# Patient Record
Sex: Female | Born: 1960 | Race: White | Hispanic: No | Marital: Married | Smoking: Current every day smoker
Health system: Southern US, Community
[De-identification: ages and names within clinical notes are randomized; demographics above are authoritative.]

## PROBLEM LIST (undated history)

## (undated) DIAGNOSIS — G35 Multiple sclerosis: Secondary | ICD-10-CM

## (undated) DIAGNOSIS — R569 Unspecified convulsions: Secondary | ICD-10-CM

## (undated) DIAGNOSIS — I10 Essential (primary) hypertension: Secondary | ICD-10-CM

## (undated) DIAGNOSIS — T7840XA Allergy, unspecified, initial encounter: Secondary | ICD-10-CM

## (undated) DIAGNOSIS — Z1589 Genetic susceptibility to other disease: Secondary | ICD-10-CM

## (undated) DIAGNOSIS — Z87440 Personal history of urinary (tract) infections: Secondary | ICD-10-CM

## (undated) DIAGNOSIS — K219 Gastro-esophageal reflux disease without esophagitis: Secondary | ICD-10-CM

## (undated) DIAGNOSIS — K635 Polyp of colon: Secondary | ICD-10-CM

## (undated) DIAGNOSIS — K589 Irritable bowel syndrome without diarrhea: Secondary | ICD-10-CM

## (undated) DIAGNOSIS — D649 Anemia, unspecified: Secondary | ICD-10-CM

## (undated) HISTORY — PX: TUBAL LIGATION: SHX77

## (undated) HISTORY — DX: Personal history of urinary (tract) infections: Z87.440

## (undated) HISTORY — PX: APPENDECTOMY: SHX54

## (undated) HISTORY — DX: Polyp of colon: K63.5

## (undated) HISTORY — DX: Essential (primary) hypertension: I10

## (undated) HISTORY — PX: TONSILLECTOMY: SUR1361

## (undated) HISTORY — DX: Genetic susceptibility to other disease: Z15.89

## (undated) HISTORY — DX: Irritable bowel syndrome without diarrhea: K58.9

## (undated) HISTORY — DX: Unspecified convulsions: R56.9

## (undated) HISTORY — DX: Anemia, unspecified: D64.9

## (undated) HISTORY — DX: Gastro-esophageal reflux disease without esophagitis: K21.9

## (undated) HISTORY — DX: Allergy, unspecified, initial encounter: T78.40XA

---

## 2001-05-14 ENCOUNTER — Emergency Department (HOSPITAL_COMMUNITY): Admission: EM | Admit: 2001-05-14 | Discharge: 2001-05-15 | Payer: Self-pay

## 2004-01-11 HISTORY — PX: PARTIAL HYSTERECTOMY: SHX80

## 2005-01-10 HISTORY — PX: BLADDER SURGERY: SHX569

## 2005-02-14 ENCOUNTER — Encounter: Admission: RE | Admit: 2005-02-14 | Discharge: 2005-02-14 | Payer: Self-pay | Admitting: Sports Medicine

## 2005-06-09 ENCOUNTER — Ambulatory Visit: Payer: Self-pay | Admitting: Internal Medicine

## 2005-06-13 ENCOUNTER — Encounter: Payer: Self-pay | Admitting: Internal Medicine

## 2005-06-13 ENCOUNTER — Ambulatory Visit: Payer: Self-pay | Admitting: Internal Medicine

## 2009-06-19 ENCOUNTER — Emergency Department (HOSPITAL_COMMUNITY): Admission: EM | Admit: 2009-06-19 | Discharge: 2009-06-19 | Payer: Self-pay | Admitting: Emergency Medicine

## 2010-03-29 LAB — POCT I-STAT, CHEM 8
BUN: 11 mg/dL (ref 6–23)
Chloride: 108 mEq/L (ref 96–112)
Glucose, Bld: 82 mg/dL (ref 70–99)
HCT: 38 % (ref 36.0–46.0)
Sodium: 142 mEq/L (ref 135–145)

## 2010-09-14 ENCOUNTER — Telehealth: Payer: Self-pay | Admitting: Internal Medicine

## 2010-09-14 ENCOUNTER — Encounter: Payer: Self-pay | Admitting: *Deleted

## 2010-09-14 NOTE — Telephone Encounter (Signed)
Left a message for patient to call me. 

## 2010-09-14 NOTE — Telephone Encounter (Signed)
Spoke with patient and she saw her PCP Dr. Selena Batten on Friday for a physical and would like to see Dr. Juanda Chance for her IBS. Scheduled patient on 09/29/10 at 3:00 PM. Letter mailed to patient.

## 2010-09-23 ENCOUNTER — Encounter: Payer: Self-pay | Admitting: Internal Medicine

## 2010-09-29 ENCOUNTER — Ambulatory Visit (INDEPENDENT_AMBULATORY_CARE_PROVIDER_SITE_OTHER): Payer: 59 | Admitting: Internal Medicine

## 2010-09-29 ENCOUNTER — Encounter: Payer: Self-pay | Admitting: Internal Medicine

## 2010-09-29 VITALS — BP 128/72 | HR 64 | Ht 63.0 in | Wt 144.0 lb

## 2010-09-29 DIAGNOSIS — K5289 Other specified noninfective gastroenteritis and colitis: Secondary | ICD-10-CM

## 2010-09-29 DIAGNOSIS — R197 Diarrhea, unspecified: Secondary | ICD-10-CM

## 2010-09-29 MED ORDER — MESALAMINE 800 MG PO TBEC: DELAYED_RELEASE_TABLET | ORAL | Status: DC

## 2010-09-29 MED ORDER — PEG-KCL-NACL-NASULF-NA ASC-C 100 G PO SOLR: 1.0000 | Freq: Once | ORAL | Status: DC

## 2010-09-29 NOTE — Patient Instructions (Addendum)
You have been scheduled for a colonoscopy. Please follow written instructions given to you at your visit today.  Please pick up your Moviprep kit at the pharmacy within the next 2-3 days. We have sent the following medications to your pharmacy for you to pick up at your convenience: Asacol HD Take 2 tablets by mouth twice daily. CC: Dr Selena Batten

## 2010-09-29 NOTE — Progress Notes (Signed)
[name] [birth year] MRN [id number]    History of Present Illness:  This is a 50 year old white female with diarrhea who was referred by Dr Selena Batten because of incontinence and urgency. We saw her in 2007 for diarrhea which responded to prednisone. She had a repair of a rectocele and cystocele and her colonoscopy showed focal minimal inflammation consistent with low-grade colitis. She has ankylosing spondylitis and a positive HLA-B27. She was found to have a hyperplastic polyp on a colonoscopy. She has been under a lot of stress and has attributed diarrhea and urgency to irritable bowel syndrome. There is no family history of colon cancer. She admits to crampy abdominal pain but denies blood per rectum.   Past Medical History  Diagnosis Date  . GERD (gastroesophageal reflux disease)   . Hypertension   . Hyperplastic colon polyp   . HLA B27 (HLA B27 positive)   . IBS (irritable bowel syndrome)   . Hx: UTI (urinary tract infection)    Past Surgical History  Procedure Date  . Partial hysterectomy   . Tubal ligation   . Appendectomy   . Bladder surgery   . Cesarean section     x 2     reports that she has been smoking.  She has never used smokeless tobacco. She reports that she does not drink alcohol or use illicit drugs. family history includes Breast cancer in her maternal grandmother; Diabetes in her maternal aunt; Heart disease in her father; and Irritable bowel syndrome in her brother and sister.  There is no history of Colon cancer. No Known Allergies     Denies dysphagia odynophagia shortness of breath or chest pain Review of Systems:  The remainder of the 10  point ROS is negative except as outlined in H&P   Physical Exam: General appearance  Well developed, in no distress. Eyes- non icteric. HEENT nontraumatic, normocephalic. Mouth no lesions, tongue papillated, no cheilosis. Neck supple without adenopathy, thyroid not enlarged, no carotid bruits, no JVD. Lungs Clear  to auscultation bilaterally. Cor normal S1 normal S2, regular rhythm , no murmur,  quiet precordium. Abdomen soft abdomen with tenderness in left lower quadrant and left middle quadrant. No palpable mass or rebound. Normal active bowel sounds. No CVA tenderness. Liver edge at costal margin. Rectal: Normal rectal sphincter tone. Normal perianal area. Stool is soft Hemoccult-negative. Extremities no pedal edema. Skin no lesions. Neurological alert and oriented x 3. Psychological normal mood and affect.  Assessment and Plan:  Problem #1 worsening diarrhea and incontinence. We need to rule out the possibility of inflammatory bowel disease. We will start her on Asacol HD 800 mg 2 by mouth twice a day and schedule her for a colonoscopy and biopsies. If it is irritable bowel syndrome, then she will need a special regimen, dietary modifications and antispasmodics. I would also consider checking IBD markers at some point.    09/29/2010 [name]

## 2010-09-30 ENCOUNTER — Encounter: Payer: Self-pay | Admitting: Internal Medicine

## 2010-10-11 ENCOUNTER — Encounter: Payer: Self-pay | Admitting: Internal Medicine

## 2010-10-11 ENCOUNTER — Ambulatory Visit (AMBULATORY_SURGERY_CENTER): Payer: 59 | Admitting: Internal Medicine

## 2010-10-11 DIAGNOSIS — R197 Diarrhea, unspecified: Secondary | ICD-10-CM

## 2010-10-11 DIAGNOSIS — K5289 Other specified noninfective gastroenteritis and colitis: Secondary | ICD-10-CM

## 2010-10-11 MED ORDER — SODIUM CHLORIDE 0.9 % IV SOLN: 500.0000 mL | INTRAVENOUS | Status: DC

## 2010-10-11 NOTE — Progress Notes (Signed)
Pt states that she fell in the shower on Thursday Sept 27th and still has bruises to right side of forehead, bilateral knees, left great toe and right wrist.

## 2010-10-11 NOTE — Patient Instructions (Signed)
Discharge instructions given with verbal understanding. Biopsies taken. Resume previous medications. 

## 2010-10-12 ENCOUNTER — Telehealth: Payer: Self-pay | Admitting: *Deleted

## 2010-10-12 NOTE — Telephone Encounter (Signed)

## 2010-10-14 ENCOUNTER — Encounter: Payer: Self-pay | Admitting: Internal Medicine

## 2012-09-24 ENCOUNTER — Encounter (HOSPITAL_COMMUNITY): Payer: Self-pay | Admitting: Emergency Medicine

## 2012-09-24 ENCOUNTER — Emergency Department (HOSPITAL_COMMUNITY)
Admission: EM | Admit: 2012-09-24 | Discharge: 2012-09-24 | Disposition: A | Discharge status: Home / Self Care | Payer: 59 | Attending: Emergency Medicine | Admitting: Emergency Medicine

## 2012-09-24 DIAGNOSIS — I10 Essential (primary) hypertension: Secondary | ICD-10-CM | POA: Insufficient documentation

## 2012-09-24 DIAGNOSIS — F29 Unspecified psychosis not due to a substance or known physiological condition: Secondary | ICD-10-CM | POA: Insufficient documentation

## 2012-09-24 DIAGNOSIS — Z860100 Personal history of colon polyps, unspecified: Secondary | ICD-10-CM | POA: Insufficient documentation

## 2012-09-24 DIAGNOSIS — D649 Anemia, unspecified: Secondary | ICD-10-CM | POA: Insufficient documentation

## 2012-09-24 DIAGNOSIS — Z9889 Other specified postprocedural states: Secondary | ICD-10-CM | POA: Insufficient documentation

## 2012-09-24 DIAGNOSIS — Z9089 Acquired absence of other organs: Secondary | ICD-10-CM | POA: Insufficient documentation

## 2012-09-24 DIAGNOSIS — R51 Headache: Secondary | ICD-10-CM | POA: Insufficient documentation

## 2012-09-24 DIAGNOSIS — Z90710 Acquired absence of both cervix and uterus: Secondary | ICD-10-CM | POA: Insufficient documentation

## 2012-09-24 DIAGNOSIS — Z8719 Personal history of other diseases of the digestive system: Secondary | ICD-10-CM | POA: Insufficient documentation

## 2012-09-24 DIAGNOSIS — G40909 Epilepsy, unspecified, not intractable, without status epilepticus: Secondary | ICD-10-CM | POA: Insufficient documentation

## 2012-09-24 DIAGNOSIS — F17200 Nicotine dependence, unspecified, uncomplicated: Secondary | ICD-10-CM | POA: Insufficient documentation

## 2012-09-24 DIAGNOSIS — Z87440 Personal history of urinary (tract) infections: Secondary | ICD-10-CM | POA: Insufficient documentation

## 2012-09-24 DIAGNOSIS — Z79899 Other long term (current) drug therapy: Secondary | ICD-10-CM | POA: Insufficient documentation

## 2012-09-24 DIAGNOSIS — R350 Frequency of micturition: Secondary | ICD-10-CM | POA: Insufficient documentation

## 2012-09-24 DIAGNOSIS — R569 Unspecified convulsions: Secondary | ICD-10-CM

## 2012-09-24 DIAGNOSIS — Z9851 Tubal ligation status: Secondary | ICD-10-CM | POA: Insufficient documentation

## 2012-09-24 LAB — POCT I-STAT, CHEM 8
Calcium, Ion: 1.2 mmol/L (ref 1.12–1.23)
Hemoglobin: 13.3 g/dL (ref 12.0–15.0)
Sodium: 143 mEq/L (ref 135–145)
TCO2: 28 mmol/L (ref 0–100)

## 2012-09-24 LAB — URINALYSIS, ROUTINE W REFLEX MICROSCOPIC
Leukocytes, UA: NEGATIVE
Nitrite: NEGATIVE
Specific Gravity, Urine: 1.018 (ref 1.005–1.030)
pH: 6 (ref 5.0–8.0)

## 2012-09-24 LAB — URINE MICROSCOPIC-ADD ON

## 2012-09-24 MED ORDER — HYDROCODONE-ACETAMINOPHEN 5-325 MG PO TABS: 1.0000 | ORAL_TABLET | Freq: Four times a day (QID) | ORAL | Status: DC | PRN

## 2012-09-24 MED ORDER — ACETAMINOPHEN 500 MG PO TABS
1000.0000 mg | ORAL_TABLET | Freq: Once | ORAL | Status: AC
  Administered 2012-09-24: 1000 mg via ORAL
  Filled 2012-09-24: qty 2

## 2012-09-24 NOTE — ED Notes (Signed)
Pt arrived via [facility] for evaluation of seizures. Per EMS, pt has hx of seizures but hasn't had one in 20 years. Pt had one seizure this morning while in the shower, EMS reports pt fell back in shower but did not hit her head. Pt then had another seizure at 1300 while on the couch that lasted 3-4 minutes and witnessed by pt husband, EMS reports pt did not fall but did bite her tongue. Bleeding was controlled upon EMS arrival and pt was also post-ictal upon arrival. Pt was recently started on a pain medication for broken foot and pt suspects that may have been the trigger per EMS.

## 2012-09-24 NOTE — ED Provider Notes (Addendum)
I saw and evaluated the patient, reviewed the resident's note and I agree with the findings and plan.  Regions of 52 of treatment with a history of hypertension, diabetes and a remote history of seizure disorder who is no longer on the antiepileptics who recently had a lower extremity injury and his daughter-in-law who's had 2 seizures today. In between seizures, patient was able to call neurology and schedule outpatient followup. She is currently having no complaints other than urinary frequency. No other recent infectious symptoms. No drug or alcohol use. She is neurologically intact on exam. Plan is to obtain basic labs, urinalysis. Her Accu-Chek was normal.  Anticipate discharge home with neurology followup. We'll not start AED at this time as this was likely due to tramadol use.  Have instructed patient to stop taking her tramadol. Given return precautions instructed patient not to perform any activities that may be dangerous if she were to have another seizure including driving.   Date: 09/24/2012 14:46  Rate: 86  Rhythm: normal sinus rhythm  QRS Axis: normal  Intervals: normal  ST/T Wave abnormalities: normal  Conduction Disutrbances: none  Narrative Interpretation: unremarkable; no ischemic changes, no arrhythmia, no change compared to prior     Layla Maw Cassidey Barrales, DO 09/24/12 1737  Matther Labell N Tifini Reeder, DO 09/24/12 1745

## 2012-09-24 NOTE — ED Provider Notes (Signed)
CSN: [id number]     Arrival date & time 09/24/12  1432 History   First MD Initiated Contact with Patient 09/24/12 1502     Chief Complaint  Patient presents with  . Seizures   (Consider location/radiation/quality/duration/timing/severity/associated sxs/prior Treatment) Patient is a 52 y.o. female presenting with seizures. The history is provided by the patient.  Seizures Seizure activity on arrival: no   Seizure type:  Grand mal Preceding symptoms: no dizziness, no headache, no nausea, no numbness and no vision change   Initial focality:  None Episode characteristics: generalized shaking, incontinence, tongue biting and unresponsiveness   Postictal symptoms: confusion   Return to baseline: yes   Severity:  Mild Duration: 3-4 minutes. Number of seizures this episode:  2 Progression:  Resolved Context: not alcohol withdrawal, not sleeping less and not previous head injury   Recent head injury:  No recent head injuries History of seizures: yes (none in 20 years, recently started Tramadol for an ankle fracture)     Past Medical History  Diagnosis Date  . GERD (gastroesophageal reflux disease)   . Hypertension   . Hyperplastic colon polyp   . HLA B27 (HLA B27 positive)   . IBS (irritable bowel syndrome)   . Hx: UTI (urinary tract infection)   . Allergy     seasonal  . Anemia   . Seizures     hx epilepsy   Past Surgical History  Procedure Laterality Date  . Partial hysterectomy    . Tubal ligation    . Appendectomy    . Bladder surgery    . Cesarean section      x 2    Family History  Problem Relation Age of Onset  . Heart disease Father   . Breast cancer Maternal Grandmother   . Diabetes Maternal Aunt   . Colon cancer Neg Hx   . Irritable bowel syndrome Sister   . Irritable bowel syndrome Brother    History  Substance Use Topics  . Smoking status: Current Every Day Smoker -- 1.00 packs/day  . Smokeless tobacco: Never Used  . Alcohol Use: No   OB History   Grav Para Term Preterm Abortions TAB SAB Ect Mult Living                 Review of Systems  Constitutional: Negative for fever and chills.  HENT: Negative for hearing loss, congestion, sore throat, neck pain and neck stiffness.   Respiratory: Negative for shortness of breath.   Cardiovascular: Negative for chest pain.  Gastrointestinal: Negative for nausea, vomiting, abdominal pain and diarrhea.  Genitourinary: Positive for frequency.  Musculoskeletal: Negative for back pain.  Neurological: Positive for seizures and headaches (after the seizure, 9/10, "top of my head", no hx of HAs prior to today). Negative for light-headedness.  All other systems reviewed and are negative.    Allergies  Review of patient's allergies indicates no known allergies.  Home Medications   Current Outpatient Rx  Name  Route  Sig  Dispense  Refill  . B Complex-C (B-COMPLEX WITH VITAMIN C) tablet   Oral   Take 1 tablet by mouth daily.         . Cholecalciferol (VITAMIN D) 2000 UNITS CAPS   Oral   Take 1 capsule by mouth daily.         Marland Kitchen Fexofenadine HCl (ALLEGRA PO)   Oral   Take 1 tablet by mouth daily.          Marland Kitchen ibuprofen (ADVIL,MOTRIN)  200 MG tablet   Oral   Take 200-800 mg by mouth every 6 (six) hours as needed for pain. Dose depends on pain level.         Marland Kitchen L-Methylfolate-B6-B12 (METANX) 3-35-2 MG TABS   Oral   Take 1 tablet by mouth daily. One tablet by mouth twice daily         . losartan (COZAAR) 25 MG tablet   Oral   Take 25 mg by mouth daily.         . traMADol (ULTRAM) 50 MG tablet   Oral   Take 50 mg by mouth every 6 (six) hours as needed for pain.         . vitamin B-12 (CYANOCOBALAMIN) 1000 MCG tablet   Oral   Take 1,000 mcg by mouth daily.            BP 128/80  Pulse 89  Temp(Src) 97.6 F (36.4 C) (Oral)  Resp 16  SpO2 96% Physical Exam  Vitals reviewed. Constitutional: She is oriented to person, place, and time. She appears well-developed and  well-nourished.  HENT:  Right Ear: External ear normal.  Left Ear: External ear normal.  Mouth/Throat: No oropharyngeal exudate.  Eyes: Conjunctivae and EOM are normal. Pupils are equal, round, and reactive to light.  Neck: Normal range of motion. Neck supple.  Cardiovascular: Normal rate, regular rhythm, normal heart sounds and intact distal pulses.  Exam reveals no gallop and no friction rub.   No murmur heard. Pulmonary/Chest: Effort normal and breath sounds normal.  Abdominal: Soft. Bowel sounds are normal. She exhibits no distension. There is no tenderness.  Musculoskeletal: Normal range of motion. She exhibits no edema.  Neurological: She is alert and oriented to person, place, and time. She has normal strength. No cranial nerve deficit or sensory deficit. Coordination normal. GCS eye subscore is 4. GCS verbal subscore is 5. GCS motor subscore is 6.  Skin: Skin is warm and dry. No rash noted.  Psychiatric: She has a normal mood and affect.    ED Course  Procedures (including critical care time) Labs Review Labs Reviewed  URINALYSIS, ROUTINE W REFLEX MICROSCOPIC - Abnormal; Notable for the following:    Hgb urine dipstick TRACE (*)    All other components within normal limits  URINE MICROSCOPIC-ADD ON - Abnormal; Notable for the following:    Squamous Epithelial / LPF MANY (*)    All other components within normal limits  GLUCOSE, CAPILLARY  POCT I-STAT, CHEM 8   Imaging Review No results found.   Date: 09/24/2012  Rate: 86  Rhythm: normal sinus rhythm  QRS Axis: normal  Intervals: normal  ST/T Wave abnormalities: normal  Conduction Disutrbances:none  Narrative Interpretation: NSR, normal EKG  Old EKG Reviewed: unchanged from EKG 06/19/09    MDM   48 y F with remote hx of epilepsy, here after she had 2 seizures today.  She is taking Tramadol for an ankle fracture.  She started this med on Thursday.  She has otherwise been feeling well.  HA now, but none prior to  seizure.  No fall or head trauma before, during or after seizure.  No other pain complaints.  AFVSS, well appearing.  A&Ox4.  Normal neuro exam.  Likely 2/2 tramadol use.  No meningismus or signs of CNS infxn.  Do not feel that CT imaging is warranted. Will eval with chem 8 and FSBS.  She already he has followup with neurology on the 18th. Will d/c tramadol and  give Rx for Norco.  5:34 PM The patient reports feeling better. Her headache is much improved. She feels back to her baseline. She was able to stand and walk with assistance, which is her baseline currently due to her ankle fracture.  Seizure precautions and seizure first aid were reviewed with the patient and her family. Return precautions reviewed. All questions were answered. It is felt at this time that she is stable for discharge with outpatient neurology followup. We discussed potentially starting Keppra, but do not feel this is indicated at this time.  Clinical Impression: 1. Seizure     Disposition: Discharge  Condition: Good  I have discussed the results, Dx and Tx plan. They expressed understanding and agree with the plan and were told to return to ED with any worsening of condition or concern.    New Prescriptions   HYDROCODONE-ACETAMINOPHEN (NORCO/VICODIN) 5-325 MG PER TABLET    Take 1-2 tablets by mouth every 6 (six) hours as needed for pain.    Follow Up: Massie Maroon, MD [address] [place] [address] [phone]  Schedule an appointment as soon as possible for a visit   Neurology as scheduled      Pt seen in conjunction with Dr. Elesa Massed.  Reine Just. Beverely Pace, MD Emergency Medicine PGY-III [phone]     Oleh Genin, MD 09/25/12 [phone]

## 2012-09-27 ENCOUNTER — Encounter: Payer: Self-pay | Admitting: Neurology

## 2012-09-27 ENCOUNTER — Ambulatory Visit (INDEPENDENT_AMBULATORY_CARE_PROVIDER_SITE_OTHER): Payer: 59 | Admitting: Neurology

## 2012-09-27 VITALS — BP 119/73 | HR 81 | Ht 63.0 in | Wt 140.0 lb

## 2012-09-27 DIAGNOSIS — G40909 Epilepsy, unspecified, not intractable, without status epilepticus: Secondary | ICD-10-CM | POA: Insufficient documentation

## 2012-09-27 DIAGNOSIS — R569 Unspecified convulsions: Secondary | ICD-10-CM

## 2012-09-27 MED ORDER — LAMOTRIGINE ER 21 X 25 MG & 7 X 50 MG PO KIT: 25.0000 mg | PACK | Freq: Every day | ORAL | Status: DC

## 2012-09-27 MED ORDER — LAMOTRIGINE ER 100 MG PO TB24: 100.0000 mg | ORAL_TABLET | Freq: Every day | ORAL | Status: DC

## 2012-09-27 NOTE — Progress Notes (Signed)
[facility]  PATIENT: [name] DOB: 19-Jun-1960  HISTORICAL   [name] is a 52 years old right-handed Caucasian female, referred by her primary care physician Dr. Selena Batten, accompanied by her husband, and daughter at today's clinical visit  She had a history of epilepsy disorder from age [age], she had generalized seizure, was treated with Dilantin, and the Mysolin, she no longer has frequent seizure since age 24.  She reported normal EEG at that time, antipeptic medication was tapered off  Most recurrent seizure was at age 17 years, without warning signs, she had a generalized seizure  She suffered a fall, with left ankle injury September11 2014, started taking tramadol 50 mg twice a day to 3 times a day, in September fifteenth, 2014, she woke up in the morning time, 6:30 AM, while brushing her teeth, she felt her eyes rolling backward, later went into tonic-clonic seizure, lasting for a few minutes, followed by post event confusion for 2 hours, later that afternoon, at 1:30 PM, she had another seizure, no warning signs, generalized tonic-clonic seizure, lasting for a few minutes,  Her daughter at age 9 also suffered seizures since age 31,   Laboratory evaluation showed normal UA, BMP,  REVIEW OF SYSTEMS: Full 14 system review of systems performed and notable only for  urination problems, incontinence, numbness, restless leg  ALLERGIES: No Known Allergies  HOME MEDICATIONS: Outpatient Prescriptions Prior to Visit  Medication Sig Dispense Refill  . B Complex-C (B-COMPLEX WITH VITAMIN C) tablet Take 1 tablet by mouth daily.      . Cholecalciferol (VITAMIN D) 2000 UNITS CAPS Take 1 capsule by mouth daily.      Marland Kitchen Fexofenadine HCl (ALLEGRA PO) Take 1 tablet by mouth daily.       Marland Kitchen HYDROcodone-acetaminophen (NORCO/VICODIN) 5-325 MG per tablet Take 1-2 tablets by mouth every 6 (six) hours as needed for pain.  30 tablet  0  . ibuprofen (ADVIL,MOTRIN) 200 MG tablet Take  200-800 mg by mouth every 6 (six) hours as needed for pain. Dose depends on pain level.      Marland Kitchen L-Methylfolate-B6-B12 (METANX) 3-35-2 MG TABS Take 1 tablet by mouth daily. One tablet by mouth twice daily      . losartan (COZAAR) 25 MG tablet Take 25 mg by mouth daily.      . vitamin B-12 (CYANOCOBALAMIN) 1000 MCG tablet Take 1,000 mcg by mouth daily.         No facility-administered medications prior to visit.    PAST MEDICAL HISTORY: Past Medical History  Diagnosis Date  . GERD (gastroesophageal reflux disease)   . Hypertension   . Hyperplastic colon polyp   . HLA B27 (HLA B27 positive)   . IBS (irritable bowel syndrome)   . Hx: UTI (urinary tract infection)   . Allergy     seasonal  . Anemia   . Seizures     hx epilepsy    PAST SURGICAL HISTORY: Past Surgical History  Procedure Laterality Date  . Partial hysterectomy    . Tubal ligation    . Appendectomy    . Bladder surgery    . Cesarean section      x 2     FAMILY HISTORY: Family History  Problem Relation Age of Onset  . Heart disease Father   . Breast cancer Maternal Grandmother   . Diabetes Maternal Aunt   . Colon cancer Neg Hx   . Irritable bowel syndrome Sister   . Irritable bowel syndrome  Brother     SOCIAL HISTORY:  History   Social History  . Marital Status: Married    Spouse Name: Windy Fast    Number of Children: 3  . Years of Education: 13   Occupational History  . [employer]    .     Social History Main Topics  . Smoking status: Current Every Day Smoker -- 1.00 packs/day for 30 years    Types: Cigarettes  . Smokeless tobacco: Never Used  . Alcohol Use: 0.6 oz/week    1 Glasses of wine per week     Comment: OCC.  . Drug Use: No  . Sexual Activity: Not on file   Other Topics Concern  . Not on file   Social History Narrative   2 caffeine drinks daily . Patient lives at home with her husband Windy Fast). Patient has some college education.   Right handed.     PHYSICAL EXAM    Filed  Vitals:   09/27/12 1003  BP: 119/73  Pulse: 81  Height: 5\' 3"  (1.6 m)  Weight: 140 lb (63.504 kg)     Body mass index is 24.81 kg/(m^2).   Generalized: In no acute distress  Neck: Supple, no carotid bruits   Cardiac: Regular rate rhythm  Pulmonary: Clear to auscultation bilaterally  Musculoskeletal: No deformity  Neurological examination  Mentation: Alert oriented to time, place, history taking, and causual conversation  Cranial nerve II-XII: Pupils were equal round reactive to light extraocular movements were full, visual field were full on confrontational test. facial sensation and strength were normal. hearing was intact to finger rubbing bilaterally. Uvula tongue midline.  head turning and shoulder shrug and were normal and symmetric.Tongue protrusion into cheek strength was normal.  Motor: normal tone, bulk and strength.  Sensory: Intact to fine touch, pinprick, preserved vibratory sensation, and proprioception at toes.  Coordination: Normal finger to nose, heel-to-shin bilaterally there was no truncal ataxia  Gait: Rising up from seated position without assistance, normal stance, without trunk ataxia, moderate stride, good arm swing, smooth turning, able to perform tiptoe, and heel walking without difficulty.   Romberg signs: Negative  Deep tendon reflexes: Brachioradialis 2/2, biceps 2/2, triceps 2/2, patellar 2/2, Achilles 2/2, plantar responses were flexor bilaterally.   DIAGNOSTIC DATA (LABS, IMAGING, TESTING) - I reviewed patient records, labs, notes, testing and imaging myself where available.  Lab Results  Component Value Date   HGB 13.3 09/24/2012   HCT 39.0 09/24/2012      Component Value Date/Time   NA 143 09/24/2012 1537   K 3.6 09/24/2012 1537   CL 104 09/24/2012 1537   GLUCOSE 97 09/24/2012 1537   BUN 14 09/24/2012 1537   CREATININE 1.00 09/24/2012 1537       ASSESSMENT AND PLAN   52 years old Caucasian female, with past medical history of  epilepsy, now with recurrent seizure, while taking tramadol 50 mg twice a day, normal neurological examination,  1. Recurrent seizure, with history of epilepsy, complete evaluation with MRI of the brain with without contrast, EEG, 2. After discussed with patient, were started lamotrigine, X.R 100 mg once every night  3.  no driving till seizure free for 6 months .        Levert Feinstein, M.D. Ph.D.  [facility] [address], [address] [place], [place] [address] [phone]

## 2012-09-28 ENCOUNTER — Ambulatory Visit (INDEPENDENT_AMBULATORY_CARE_PROVIDER_SITE_OTHER): Payer: 59

## 2012-09-28 ENCOUNTER — Telehealth: Payer: Self-pay | Admitting: Neurology

## 2012-09-28 DIAGNOSIS — R569 Unspecified convulsions: Secondary | ICD-10-CM

## 2012-09-28 NOTE — Procedures (Signed)
HISTORY: 52 years old female, with history of epilepsy disorder, presenting with recurrent generalized tonic-clonic seizure.  TECHNIQUE:  16 channel EEG was performed based on standard 10-16 international system. One channel was dedicated to EKG, which has demonstrates normal sinus rhythm of 78 beats per minutes.  Upon awakening, the posterior background activity was well-developed, in alpha range 10Hz , with amplitude of 35 microvoltage, reactive to eye opening and closure.  There was frequent short intrusion of spike-slow waves, lasting 1-3 second.   Photic stimulation was performed, which induced a symmetric photic driving.  Hyperventilation was performed, there was no abnormality elicit.  No sleep was achieved.  CONCLUSION: This is an abnormal EEG.  There is electrodiagnostic evidence of generalized epileptiform discharge, consistent with a history of generalized epilepsy disorder.

## 2012-09-28 NOTE — Telephone Encounter (Signed)
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

## 2012-10-01 ENCOUNTER — Telehealth: Payer: Self-pay | Admitting: Neurology

## 2012-10-05 NOTE — Telephone Encounter (Signed)
Patient requesting a letter stating it is ok to return to work.

## 2012-10-08 NOTE — Telephone Encounter (Signed)
Please generate a note, it is OK to return to work, but she should not drive, until seizure free for 6 months

## 2012-10-15 ENCOUNTER — Encounter: Payer: Self-pay | Admitting: *Deleted

## 2012-10-15 NOTE — Telephone Encounter (Signed)
A return to work note has been generated and faxed to [phone].

## 2012-10-16 DIAGNOSIS — R569 Unspecified convulsions: Secondary | ICD-10-CM

## 2012-10-18 ENCOUNTER — Telehealth: Payer: Self-pay | Admitting: Neurology

## 2012-10-19 MED ORDER — LEVETIRACETAM 500 MG PO TABS: 500.0000 mg | ORAL_TABLET | Freq: Two times a day (BID) | ORAL | Status: DC

## 2012-10-19 NOTE — Telephone Encounter (Signed)
I have called [name], she has rash at her right leg, right calf, goes up to half of her leg. She had it since Lamictal.  She also complains of hand, thumb itching.  1. Stop Lamictal. 2. Switch her to keppra 500mg  bid.

## 2012-10-19 NOTE — Telephone Encounter (Signed)
Spoke to patient. She says she started her 3rd week of Lamotrigine kit on Saturday (10/13/12). On Sunday (10/14/12) she noticed a rash on the back of her lower calf on her R leg. The rash spreaded to the middle of her calf, and a little of the rash is present on her R ankle and foot. The patient reports the rash is a  strawberry color and is warm to her touch. She believes the rash is a reaction to the med and not a insect bite or allergic reaction. Please advise.

## 2012-10-22 ENCOUNTER — Other Ambulatory Visit: Payer: Self-pay | Admitting: Diagnostic Neuroimaging

## 2012-10-22 DIAGNOSIS — R569 Unspecified convulsions: Secondary | ICD-10-CM

## 2012-10-25 NOTE — Progress Notes (Signed)
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

## 2012-10-29 NOTE — Progress Notes (Signed)
Quick Note:  Spoke to patient and relayed MRI brain results, per Dr. Terrace Arabia. Patient mentioned that her new seizure med, Keppra, is making her very tired. She will see if her body adjusts in the next week or two and if not will give [place] a call. ______

## 2013-03-04 ENCOUNTER — Telehealth: Payer: Self-pay | Admitting: Nurse Practitioner

## 2013-03-04 NOTE — Telephone Encounter (Signed)
Pt was scheduled for 03/27/13 CM will be out of the office, could not find anything until 07/08/13. Pt states she needs to come in sooner since she has some questions and also concerning her driving, brain scan and some other issues. Please call pt on cell phone for these concerns.

## 2013-03-15 NOTE — Telephone Encounter (Signed)
Pt is calling back needs someone to call her back concerning her driving and other issues. Please contact pt concerning this matter. Pt did call back on 03/04/13 and needs to get in soon.

## 2013-03-18 NOTE — Telephone Encounter (Signed)
Spoke with patient and she would like to know if she may start driving on Monday  (74/16/38) , six months will be up then and she has not had another seizure.   She would also like a letter stating that the reason for the seizure that she had was from medication (tramadol). She has to have this in order to get paid from [employer], for the days missed from work.

## 2013-03-19 NOTE — Telephone Encounter (Signed)
I have called [name], left a message at her cell phone, and home phone She has a history of epilepsy, had a recurrent seizure in September 2014, while she sustained an ankle injury, taking tramadol,

## 2013-03-20 ENCOUNTER — Telehealth: Payer: Self-pay | Admitting: Neurology

## 2013-03-20 NOTE — Telephone Encounter (Signed)
Pt called and asked if Dr. Terrace Arabia could write a letter stating that in her medical opinion the medicine the pt was taking was the cause of her seizures.  She needs this so that she will be able to get her Workman's Comp paid.  She also asked if she needed to stay on the medicine that Dr. Terrace Arabia prescribed or if she can get off them. She states that she hasn't had a seizure since September 2014.  She asked when she will be able to start driving again and if Dr. Terrace Arabia needed to send anything to the [facility] stating that the patient is ok to drive.  Please call her cell at [phone].  Thank you

## 2013-03-20 NOTE — Telephone Encounter (Signed)
Spoke with patient and informed that a f/u appt, is needed since her previous one was cancelled, explained that the physician/NP may address her questions at that time, she verbalized understanding

## 2013-03-22 ENCOUNTER — Encounter (INDEPENDENT_AMBULATORY_CARE_PROVIDER_SITE_OTHER): Payer: Self-pay

## 2013-03-22 ENCOUNTER — Encounter: Payer: Self-pay | Admitting: Nurse Practitioner

## 2013-03-22 ENCOUNTER — Ambulatory Visit (INDEPENDENT_AMBULATORY_CARE_PROVIDER_SITE_OTHER): Payer: 59 | Admitting: Nurse Practitioner

## 2013-03-22 VITALS — BP 107/71 | HR 76 | Ht 63.0 in | Wt 140.0 lb

## 2013-03-22 DIAGNOSIS — R569 Unspecified convulsions: Secondary | ICD-10-CM

## 2013-03-22 NOTE — Progress Notes (Signed)
[facility]  PATIENT: [name] DOB: April 26, 1960  HISTORICAL   [name] is a 53 years old right-handed Caucasian female, referred by her primary care physician Dr. Maudie [name], accompanied by her husband, and daughter at today's clinical visit  She had a history of epilepsy disorder from age [age], she had generalized seizure, was treated with Dilantin, and the Mysolin, she no longer has frequent seizure since age 53.  She reported normal EEG at that time, antipeptic medication was tapered off  Most recurrent seizure was at age 85 years, without warning signs, she had a generalized seizure  She suffered a fall, with left ankle injury September11 2014, started taking tramadol 50 mg twice a day to 3 times a day, in September 15th, 2014, she woke up in the morning time, 6:30 AM, while brushing her teeth, she felt her eyes rolling backward, later went into tonic-clonic seizure, lasting for a few minutes, followed by post event confusion for 2 hours, later that afternoon, at 1:30 PM, she had another seizure, no warning signs, generalized tonic-clonic seizure, lasting for a few minutes,  Her daughter at age 60 also suffered seizures since age 54,   Laboratory evaluation showed normal UA, BMP,  UPDATE March 22 2013: She was initially given prescription of Lamictal in September 2014, developed right leg rash, later was switched to Keppra 5 mg twice a day in October 2014,  She felt tired, light headed.  She has no recurrent seizure since last visit. Last seizure was 09/24/2012   EEG was abnormal showed generalized epileptiform discharges. 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  She works at [employer], also does Radiation protection practitioner.  Before today's visit, she has called office for "She would also like a letter stating that the reason for the seizure that she had was from medication (tramadol). She has to have this in order to get paid from [employer], for the  days missed from work."    She wants a letter today, despite my explanation that she has a tendency to have a recurrent seizure, because of underlying epilepsy disorder, abnormal EEG,  She left practice abruptly "I am going to find a different neurologist". We will discharge her from our clinic   REVIEW OF SYSTEMS: Full 14 system review of systems performed and notable only for fatigue, walking difficulty, restless leg, dizziness, headaches  ALLERGIES: No Known Allergies  HOME MEDICATIONS: Outpatient Prescriptions Prior to Visit  Medication Sig Dispense Refill  . B Complex-C (B-COMPLEX WITH VITAMIN C) tablet Take 1 tablet by mouth daily.      . Cholecalciferol (VITAMIN D) 2000 UNITS CAPS Take 1 capsule by mouth daily.      Marland Kitchen Fexofenadine HCl (ALLEGRA PO) Take 1 tablet by mouth daily.       . fluconazole (DIFLUCAN) 150 MG tablet Take 150 mg by mouth as needed.       Marland Kitchen L-Methylfolate-B6-B12 (METANX) 3-35-2 MG TABS Take 1 tablet by mouth daily. One tablet by mouth twice daily      . levETIRAcetam (KEPPRA) 500 MG tablet Take 1 tablet (500 mg total) by mouth 2 (two) times daily.  60 tablet  12  . losartan (COZAAR) 25 MG tablet Take 25 mg by mouth daily.      . vitamin B-12 (CYANOCOBALAMIN) 1000 MCG tablet Take 1,000 mcg by mouth daily.        . LamoTRIgine 100 MG TB24 Take 1 tablet (100 mg total) by mouth at  bedtime.  30 tablet  12  . LamoTRIgine 25 (21)-50 (7) MG KIT Take 25 mg by mouth daily.  1 kit  0  . HYDROcodone-acetaminophen (NORCO/VICODIN) 5-325 MG per tablet Take 1-2 tablets by mouth every 6 (six) hours as needed for pain.  30 tablet  0  . ibuprofen (ADVIL,MOTRIN) 200 MG tablet Take 200-800 mg by mouth every 6 (six) hours as needed for pain. Dose depends on pain level.      Marland Kitchen PREMARIN vaginal cream        No facility-administered medications prior to visit.    PAST MEDICAL HISTORY: Past Medical History  Diagnosis Date  . GERD (gastroesophageal reflux disease)   . Hypertension    . Hyperplastic colon polyp   . HLA B27 (HLA B27 positive)   . IBS (irritable bowel syndrome)   . Hx: UTI (urinary tract infection)   . Allergy     seasonal  . Anemia   . Seizures     hx epilepsy    PAST SURGICAL HISTORY: Past Surgical History  Procedure Laterality Date  . Partial hysterectomy    . Tubal ligation    . Appendectomy    . Bladder surgery    . Cesarean section      x 2     FAMILY HISTORY: Family History  Problem Relation Age of Onset  . Heart disease Father   . Breast cancer Maternal Grandmother   . Diabetes Maternal Aunt   . Colon cancer Neg Hx   . Irritable bowel syndrome Sister   . Irritable bowel syndrome Brother     SOCIAL HISTORY:  History   Social History  . Marital Status: Married    Spouse Name: Jori Moll    Number of Children: 3  . Years of Education: 13   Occupational History  . Mortage    .     Social History Main Topics  . Smoking status: Current Every Day Smoker -- 1.00 packs/day for 30 years    Types: Cigarettes  . Smokeless tobacco: Never Used  . Alcohol Use: 0.6 oz/week    1 Glasses of wine per week     Comment: OCC.  . Drug Use: No  . Sexual Activity: Not on file   Other Topics Concern  . Not on file   Social History Narrative   2 caffeine drinks daily . Patient lives at home with her husband Jori Moll) and her two children.    Patient has three children.   Patient has some a high school education and took some college courses.   Right handed.     PHYSICAL EXAM    Filed Vitals:   03/22/13 1200  BP: 107/71  Pulse: 76  Height: _0  (1.6 m)  Weight: 140 lb (63.504 kg)     Body mass index is 24.81 kg/(m^2).  She left clinic without examination  DIAGNOSTIC DATA (LABS, IMAGING, TESTING) - I reviewed patient records, labs, notes, testing and imaging myself where available.  Lab Results  Component Value Date   HGB 13.3 09/24/2012   HCT 39.0 09/24/2012      Component Value Date/Time   NA 143 09/24/2012 1537    K 3.6 09/24/2012 1537   CL 104 09/24/2012 1537   GLUCOSE 97 09/24/2012 1537   BUN 14 09/24/2012 1537   CREATININE 1.00 09/24/2012 1537       ASSESSMENT AND PLAN   53 years old Caucasian female, with past medical history of epilepsy, now with recurrent seizure,  while taking tramadol 50 mg twice a day, MRI of the brain showed mild small vessel disease, EEG showed epileptiform discharge, she is to continue taking Keppra 500 mg twice a day,  We will discharge her from clinic, because interruption of physician and patient's relationship, detailed above  Marcial Pacas, M.D. Ph.D.  [facility] [address] [place], [place] [address] [phone]

## 2013-03-25 ENCOUNTER — Encounter: Payer: Self-pay | Admitting: Neurology

## 2013-03-27 ENCOUNTER — Ambulatory Visit: Payer: 59 | Admitting: Nurse Practitioner

## 2013-06-24 ENCOUNTER — Encounter (HOSPITAL_BASED_OUTPATIENT_CLINIC_OR_DEPARTMENT_OTHER): Payer: Self-pay | Admitting: *Deleted

## 2013-06-24 NOTE — Progress Notes (Signed)
Will need istat- 

## 2013-06-26 ENCOUNTER — Other Ambulatory Visit: Payer: Self-pay | Admitting: Orthopedic Surgery

## 2013-06-27 ENCOUNTER — Encounter (HOSPITAL_BASED_OUTPATIENT_CLINIC_OR_DEPARTMENT_OTHER)
Admission: RE | Disposition: A | Discharge status: Home / Self Care | Payer: Self-pay | Source: Ambulatory Visit | Attending: Orthopedic Surgery

## 2013-06-27 ENCOUNTER — Ambulatory Visit (HOSPITAL_BASED_OUTPATIENT_CLINIC_OR_DEPARTMENT_OTHER): Payer: Worker's Compensation | Admitting: Certified Registered"

## 2013-06-27 ENCOUNTER — Encounter (HOSPITAL_BASED_OUTPATIENT_CLINIC_OR_DEPARTMENT_OTHER): Payer: Worker's Compensation | Admitting: Certified Registered"

## 2013-06-27 ENCOUNTER — Encounter (HOSPITAL_BASED_OUTPATIENT_CLINIC_OR_DEPARTMENT_OTHER): Payer: Self-pay

## 2013-06-27 ENCOUNTER — Ambulatory Visit (HOSPITAL_BASED_OUTPATIENT_CLINIC_OR_DEPARTMENT_OTHER)
Admission: RE | Admit: 2013-06-27 | Discharge: 2013-06-27 | Disposition: A | Discharge status: Home / Self Care | Payer: Worker's Compensation | Source: Ambulatory Visit | Attending: Orthopedic Surgery | Admitting: Orthopedic Surgery

## 2013-06-27 DIAGNOSIS — I10 Essential (primary) hypertension: Secondary | ICD-10-CM | POA: Insufficient documentation

## 2013-06-27 DIAGNOSIS — R569 Unspecified convulsions: Secondary | ICD-10-CM | POA: Insufficient documentation

## 2013-06-27 DIAGNOSIS — J301 Allergic rhinitis due to pollen: Secondary | ICD-10-CM | POA: Insufficient documentation

## 2013-06-27 DIAGNOSIS — F17200 Nicotine dependence, unspecified, uncomplicated: Secondary | ICD-10-CM | POA: Insufficient documentation

## 2013-06-27 DIAGNOSIS — S92909K Unspecified fracture of unspecified foot, subsequent encounter for fracture with nonunion: Secondary | ICD-10-CM

## 2013-06-27 DIAGNOSIS — K219 Gastro-esophageal reflux disease without esophagitis: Secondary | ICD-10-CM | POA: Insufficient documentation

## 2013-06-27 DIAGNOSIS — IMO0002 Reserved for concepts with insufficient information to code with codable children: Secondary | ICD-10-CM | POA: Insufficient documentation

## 2013-06-27 DIAGNOSIS — K589 Irritable bowel syndrome without diarrhea: Secondary | ICD-10-CM | POA: Insufficient documentation

## 2013-06-27 DIAGNOSIS — D649 Anemia, unspecified: Secondary | ICD-10-CM | POA: Insufficient documentation

## 2013-06-27 HISTORY — PX: ORIF TOE FRACTURE: SHX5032

## 2013-06-27 LAB — POCT I-STAT, CHEM 8
BUN: 14 mg/dL (ref 6–23)
CHLORIDE: 107 meq/L (ref 96–112)
Calcium, Ion: 1.15 mmol/L (ref 1.12–1.23)
Creatinine, Ser: 0.8 mg/dL (ref 0.50–1.10)
GLUCOSE: 84 mg/dL (ref 70–99)
HCT: 40 % (ref 36.0–46.0)
Hemoglobin: 13.6 g/dL (ref 12.0–15.0)
POTASSIUM: 3.6 meq/L — AB (ref 3.7–5.3)
SODIUM: 143 meq/L (ref 137–147)
TCO2: 23 mmol/L (ref 0–100)

## 2013-06-27 SURGERY — OPEN REDUCTION INTERNAL FIXATION (ORIF) METATARSAL (TOE) FRACTURE
Anesthesia: General | Site: Foot | Laterality: Left

## 2013-06-27 MED ORDER — FENTANYL CITRATE 0.05 MG/ML IJ SOLN
INTRAMUSCULAR | Status: AC
  Filled 2013-06-27: qty 2

## 2013-06-27 MED ORDER — CEFAZOLIN SODIUM-DEXTROSE 2-3 GM-% IV SOLR
INTRAVENOUS | Status: AC
  Filled 2013-06-27: qty 50

## 2013-06-27 MED ORDER — ONDANSETRON HCL 4 MG/2ML IJ SOLN: 4.0000 mg | Freq: Once | INTRAMUSCULAR | Status: DC | PRN

## 2013-06-27 MED ORDER — HYDROMORPHONE HCL PF 1 MG/ML IJ SOLN
INTRAMUSCULAR | Status: AC
  Filled 2013-06-27: qty 1

## 2013-06-27 MED ORDER — BUPIVACAINE-EPINEPHRINE (PF) 0.5% -1:200000 IJ SOLN
INTRAMUSCULAR | Status: DC | PRN
  Administered 2013-06-27: 25 mL via PERINEURAL

## 2013-06-27 MED ORDER — PROPOFOL 10 MG/ML IV BOLUS
INTRAVENOUS | Status: DC | PRN
  Administered 2013-06-27: 200 mg via INTRAVENOUS

## 2013-06-27 MED ORDER — OXYCODONE HCL 5 MG/5ML PO SOLN: 5.0000 mg | Freq: Once | ORAL | Status: DC | PRN

## 2013-06-27 MED ORDER — CEFAZOLIN SODIUM-DEXTROSE 2-3 GM-% IV SOLR
2.0000 g | INTRAVENOUS | Status: AC
  Administered 2013-06-27: 2 g via INTRAVENOUS

## 2013-06-27 MED ORDER — MIDAZOLAM HCL 2 MG/2ML IJ SOLN
INTRAMUSCULAR | Status: AC
  Filled 2013-06-27: qty 2

## 2013-06-27 MED ORDER — MIDAZOLAM HCL 2 MG/2ML IJ SOLN
1.0000 mg | INTRAMUSCULAR | Status: DC | PRN
  Administered 2013-06-27: 2 mg via INTRAVENOUS

## 2013-06-27 MED ORDER — CHLORHEXIDINE GLUCONATE 4 % EX LIQD: 60.0000 mL | Freq: Once | CUTANEOUS | Status: DC

## 2013-06-27 MED ORDER — BACITRACIN ZINC 500 UNIT/GM EX OINT
TOPICAL_OINTMENT | CUTANEOUS | Status: DC | PRN
Start: 2013-06-27 — End: 2013-06-27
  Administered 2013-06-27: 1 via TOPICAL

## 2013-06-27 MED ORDER — DEXAMETHASONE SODIUM PHOSPHATE 10 MG/ML IJ SOLN
INTRAMUSCULAR | Status: DC | PRN
  Administered 2013-06-27: 10 mg via INTRAVENOUS

## 2013-06-27 MED ORDER — LACTATED RINGERS IV SOLN
INTRAVENOUS | Status: DC
  Administered 2013-06-27 (×2): via INTRAVENOUS

## 2013-06-27 MED ORDER — LIDOCAINE HCL (CARDIAC) 20 MG/ML IV SOLN
INTRAVENOUS | Status: DC | PRN
  Administered 2013-06-27: 100 mg via INTRAVENOUS

## 2013-06-27 MED ORDER — FENTANYL CITRATE 0.05 MG/ML IJ SOLN
50.0000 ug | INTRAMUSCULAR | Status: DC | PRN
  Administered 2013-06-27: 100 ug via INTRAVENOUS

## 2013-06-27 MED ORDER — ONDANSETRON HCL 4 MG/2ML IJ SOLN
INTRAMUSCULAR | Status: DC | PRN
Start: 2013-06-27 — End: 2013-06-27
  Administered 2013-06-27: 4 mg via INTRAVENOUS

## 2013-06-27 MED ORDER — OXYCODONE HCL 5 MG PO TABS: 5.0000 mg | ORAL_TABLET | Freq: Once | ORAL | Status: DC | PRN

## 2013-06-27 MED ORDER — FENTANYL CITRATE 0.05 MG/ML IJ SOLN
INTRAMUSCULAR | Status: AC
  Filled 2013-06-27: qty 6

## 2013-06-27 MED ORDER — HYDROMORPHONE HCL PF 1 MG/ML IJ SOLN
0.2500 mg | INTRAMUSCULAR | Status: DC | PRN
  Administered 2013-06-27 (×3): 0.5 mg via INTRAVENOUS

## 2013-06-27 MED ORDER — HYDROCODONE-ACETAMINOPHEN 5-325 MG PO TABS: 1.0000 | ORAL_TABLET | Freq: Four times a day (QID) | ORAL | Status: DC | PRN

## 2013-06-27 MED ORDER — 0.9 % SODIUM CHLORIDE (POUR BTL) OPTIME
TOPICAL | Status: DC | PRN
  Administered 2013-06-27: 200 mL

## 2013-06-27 MED ORDER — SODIUM CHLORIDE 0.9 % IV SOLN: INTRAVENOUS | Status: DC

## 2013-06-27 SURGICAL SUPPLY — 72 items
BANDAGE ESMARK 6X9 LF (GAUZE/BANDAGES/DRESSINGS) IMPLANT
BIT DRILL CANN 3.5 (BIT) ×2
BIT DRILL SRG 3.5XCAN FFTH MTR (BIT) IMPLANT
BIT DRL SRG 3.5XCANN FIFTH MTR (BIT) ×1
BLADE SURG 15 STRL LF DISP TIS (BLADE) ×2 IMPLANT
BLADE SURG 15 STRL SS (BLADE) ×4
BNDG CMPR 9X4 STRL LF SNTH (GAUZE/BANDAGES/DRESSINGS)
BNDG CMPR 9X6 STRL LF SNTH (GAUZE/BANDAGES/DRESSINGS) ×1
BNDG COHESIVE 4X5 TAN STRL (GAUZE/BANDAGES/DRESSINGS) ×2 IMPLANT
BNDG COHESIVE 6X5 TAN STRL LF (GAUZE/BANDAGES/DRESSINGS) ×2 IMPLANT
BNDG ESMARK 4X9 LF (GAUZE/BANDAGES/DRESSINGS) IMPLANT
BNDG ESMARK 6X9 LF (GAUZE/BANDAGES/DRESSINGS) ×2
CAP PIN PROTECTOR ORTHO WHT (CAP) IMPLANT
CHLORAPREP W/TINT 26ML (MISCELLANEOUS) ×3 IMPLANT
COVER TABLE BACK 60X90 (DRAPES) ×2 IMPLANT
CUFF TOURNIQUET SINGLE 34IN LL (TOURNIQUET CUFF) IMPLANT
DECANTER SPIKE VIAL GLASS SM (MISCELLANEOUS) IMPLANT
DRAPE C-ARM 42X72 X-RAY (DRAPES) IMPLANT
DRAPE C-ARMOR (DRAPES) IMPLANT
DRAPE EXTREMITY T 121X128X90 (DRAPE) ×2 IMPLANT
DRAPE OEC MINIVIEW 54X84 (DRAPES) ×1 IMPLANT
DRAPE U-SHAPE 47X51 STRL (DRAPES) ×2 IMPLANT
DRSG EMULSION OIL 3X3 NADH (GAUZE/BANDAGES/DRESSINGS) ×2 IMPLANT
DRSG PAD ABDOMINAL 8X10 ST (GAUZE/BANDAGES/DRESSINGS) ×2 IMPLANT
ELECT REM PT RETURN 9FT ADLT (ELECTROSURGICAL) ×2
ELECTRODE REM PT RTRN 9FT ADLT (ELECTROSURGICAL) ×1 IMPLANT
GAUZE SPONGE 4X4 12PLY STRL (GAUZE/BANDAGES/DRESSINGS) ×2 IMPLANT
GLOVE BIO SURGEON STRL SZ8 (GLOVE) ×2 IMPLANT
GLOVE BIOGEL PI IND STRL 7.0 (GLOVE) IMPLANT
GLOVE BIOGEL PI IND STRL 8 (GLOVE) ×1 IMPLANT
GLOVE BIOGEL PI INDICATOR 7.0 (GLOVE) ×1
GLOVE BIOGEL PI INDICATOR 8 (GLOVE) ×1
GLOVE ECLIPSE 6.5 STRL STRAW (GLOVE) ×1 IMPLANT
GLOVE EXAM NITRILE MD LF STRL (GLOVE) IMPLANT
GOWN STRL REUS W/ TWL LRG LVL3 (GOWN DISPOSABLE) ×1 IMPLANT
GOWN STRL REUS W/ TWL XL LVL3 (GOWN DISPOSABLE) ×1 IMPLANT
GOWN STRL REUS W/TWL LRG LVL3 (GOWN DISPOSABLE) ×2
GOWN STRL REUS W/TWL XL LVL3 (GOWN DISPOSABLE) ×2
GUIDEWIRE 2.0 MM ×2 IMPLANT
K-WIRE .054X4 (WIRE) IMPLANT
NDL HYPO 25X1 1.5 SAFETY (NEEDLE) IMPLANT
NEEDLE HYPO 22GX1.5 SAFETY (NEEDLE) IMPLANT
NEEDLE HYPO 25X1 1.5 SAFETY (NEEDLE) IMPLANT
PACK BASIN DAY SURGERY FS (CUSTOM PROCEDURE TRAY) ×2 IMPLANT
PAD CAST 4YDX4 CTTN HI CHSV (CAST SUPPLIES) ×1 IMPLANT
PADDING CAST ABS 4INX4YD NS (CAST SUPPLIES)
PADDING CAST ABS COTTON 4X4 ST (CAST SUPPLIES) IMPLANT
PADDING CAST COTTON 4X4 STRL (CAST SUPPLIES) ×4
PADDING CAST COTTON 6X4 STRL (CAST SUPPLIES) ×2 IMPLANT
PENCIL BUTTON HOLSTER BLD 10FT (ELECTRODE) ×2 IMPLANT
SANITIZER HAND PURELL 535ML FO (MISCELLANEOUS) ×2 IMPLANT
SCREW LP PT 5.5X40MM (Screw) ×1 IMPLANT
SHEET MEDIUM DRAPE 40X70 STRL (DRAPES) ×2 IMPLANT
SLEEVE SCD COMPRESS KNEE MED (MISCELLANEOUS) ×2 IMPLANT
SPLINT FAST PLASTER 5X30 (CAST SUPPLIES) ×20
SPLINT PLASTER CAST FAST 5X30 (CAST SUPPLIES) IMPLANT
SPONGE LAP 18X18 X RAY DECT (DISPOSABLE) ×2 IMPLANT
STOCKINETTE 6  STRL (DRAPES) ×1
STOCKINETTE 6 STRL (DRAPES) ×1 IMPLANT
STRIP CLOSURE SKIN 1/2X4 (GAUZE/BANDAGES/DRESSINGS) IMPLANT
SUCTION FRAZIER TIP 10 FR DISP (SUCTIONS) ×2 IMPLANT
SUT ETHILON 3 0 PS 1 (SUTURE) ×2 IMPLANT
SUT MNCRL AB 3-0 PS2 18 (SUTURE) ×1 IMPLANT
SUT VIC AB 0 SH 27 (SUTURE) IMPLANT
SUT VIC AB 2-0 SH 27 (SUTURE)
SUT VIC AB 2-0 SH 27XBRD (SUTURE) IMPLANT
SYR BULB 3OZ (MISCELLANEOUS) ×3 IMPLANT
SYR CONTROL 10ML LL (SYRINGE) IMPLANT
TOWEL OR 17X24 6PK STRL BLUE (TOWEL DISPOSABLE) ×2 IMPLANT
TOWEL OR NON WOVEN STRL DISP B (DISPOSABLE) IMPLANT
TUBE CONNECTING 20X1/4 (TUBING) ×2 IMPLANT
UNDERPAD 30X30 INCONTINENT (UNDERPADS AND DIAPERS) ×2 IMPLANT

## 2013-06-27 NOTE — Anesthesia Preprocedure Evaluation (Signed)
Anesthesia Evaluation  Patient identified by MRN, date of birth, ID band Patient awake    Reviewed: Allergy & Precautions, H&P , NPO status , Patient's Chart, lab work & pertinent test results  Airway Mallampati: I TM Distance: >3 FB Neck ROM: Full    Dental  (+) Teeth Intact, Dental Advisory Given   Pulmonary Current Smoker,  breath sounds clear to auscultation        Cardiovascular hypertension, Pt. on medications Rhythm:Regular Rate:Normal     Neuro/Psych    GI/Hepatic GERD-  Medicated and Controlled,  Endo/Other    Renal/GU      Musculoskeletal   Abdominal   Peds  Hematology   Anesthesia Other Findings   Reproductive/Obstetrics                           Anesthesia Physical Anesthesia Plan  ASA: I  Anesthesia Plan: General   Post-op Pain Management:    Induction: Intravenous  Airway Management Planned: LMA  Additional Equipment:   Intra-op Plan:   Post-operative Plan: Extubation in OR  Informed Consent: I have reviewed the patients History and Physical, chart, labs and discussed the procedure including the risks, benefits and alternatives for the proposed anesthesia with the patient or authorized representative who has indicated his/her understanding and acceptance.   Dental advisory given  Plan Discussed with: CRNA, Anesthesiologist and Surgeon  Anesthesia Plan Comments:         Anesthesia Quick Evaluation

## 2013-06-27 NOTE — Anesthesia Procedure Notes (Addendum)
Procedure Name: LMA Insertion Date/Time: 06/27/2013 1:02 PM Performed by: Caren Macadam Pre-anesthesia Checklist: Patient identified, Emergency Drugs available, Suction available and Patient being monitored Patient Re-evaluated:Patient Re-evaluated prior to inductionOxygen Delivery Method: Circle System Utilized Preoxygenation: Pre-oxygenation with 100% oxygen Intubation Type: IV induction Ventilation: Mask ventilation without difficulty LMA: LMA inserted LMA Size: 4.0 Number of attempts: 1 Airway Equipment and Method: bite block Placement Confirmation: positive ETCO2 and breath sounds checked- equal and bilateral Tube secured with: Tape Dental Injury: Teeth and Oropharynx as per pre-operative assessment     Anesthesia Regional Block:  Popliteal block  Pre-Anesthetic Checklist: ,, timeout performed, Correct Patient, Correct Site, Correct Laterality, Correct Procedure, Correct Position, site marked, Risks and benefits discussed,  Surgical consent,  Pre-op evaluation,  At surgeon's request and post-op pain management  Laterality: Left and Lower  Prep: chloraprep       Needles:  Injection technique: Single-shot  Needle Type: Echogenic Needle     Needle Length: 9cm 9 cm Needle Gauge: 21 and 21 G    Additional Needles:  Procedures: ultrasound guided (picture in chart) Popliteal block Narrative:  Start time: 06/27/2013 12:45 PM End time: 06/27/2013 12:50 PM Injection made incrementally with aspirations every 5 mL.  Performed by: Personally  Anesthesiologist: Sheldon Silvan, MD   Anesthesia Procedure Note

## 2013-06-27 NOTE — Anesthesia Postprocedure Evaluation (Signed)
  Anesthesia Post-op Note  Patient: [name]  Procedure(s) Performed: Procedure(s): OPEN REDUCTION INTERNAL FIXATION (ORIF) LEFT FIFTH METATARSAL (Left)  Patient Location: PACU  Anesthesia Type:GA combined with regional for post-op pain  Level of Consciousness: awake, alert  and oriented  Airway and Oxygen Therapy: Patient Spontanous Breathing  Post-op Pain: mild  Post-op Assessment: Post-op Vital signs reviewed  Post-op Vital Signs: Reviewed  Last Vitals:  Filed Vitals:   06/27/13 1415  BP: 94/63  Pulse: 66  Temp:   Resp: 10    Complications: No apparent anesthesia complications

## 2013-06-27 NOTE — Progress Notes (Signed)
Assisted Dr. Ivin Bootyrews with left pop ultrasound guidedd  block. Side rails up, monitors on throughout procedure. See vital signs in flow sheet. Tolerated Procedure well.

## 2013-06-27 NOTE — Brief Op Note (Signed)
06/27/2013  1:41 PM  PATIENT:  [name]  53 y.o. 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  Repair of right 5th metatarsal fracture nonunion 28322   2.  AP, lateral and oblique xrays of the left foot  SURGEON:  Toni ArthursJohn Kelso Bibby, MD  ASSISTANT: n/a  ANESTHESIA:   General, regional  EBL:  minimal   TOURNIQUET:  n/a  COMPLICATIONS:  None apparent  DISPOSITION:  Extubated, awake and stable to recovery.  DICTATION ID:  [id number]

## 2013-06-27 NOTE — H&P (Signed)
[name] is an 53 y.o. female.   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  She has failed non-operative management of the fracture including NWB and WB immobilization and a bone stimulator.  Past Medical History  Diagnosis Date  . GERD (gastroesophageal reflux disease)   . Hypertension   . Hyperplastic colon polyp   . HLA B27 (HLA B27 positive)   . IBS (irritable bowel syndrome)   . Hx: UTI (urinary tract infection)   . Allergy     seasonal  . Anemia   . Seizures     hx epilepsy-last seizure 9/14 after taking tramadol    Past Surgical History  Procedure Laterality Date  . Partial hysterectomy  2006  . Tubal ligation    . Appendectomy    . Bladder surgery  2007  . Cesarean section      x 2   . Tonsillectomy      Family History  Problem Relation Age of Onset  . Heart disease Father   . Breast cancer Maternal Grandmother   . Diabetes Maternal Aunt   . Colon cancer Neg Hx   . Irritable bowel syndrome Sister   . Irritable bowel syndrome Brother    Social History:  reports that she has been smoking Cigarettes.  She has a 30 pack-year smoking history. She has never used smokeless tobacco. She reports that she drinks about .6 ounces of alcohol per week. She reports that she does not use illicit drugs.  Allergies:  Allergies  Allergen Reactions  . Lamictal [Lamotrigine] Rash  . Tramadol     Lowers seizure threshold    Medications Prior to Admission  Medication Sig Dispense Refill  . acetaminophen (TYLENOL) 500 MG tablet Take 500 mg by mouth 2 (two) times daily.      . B Complex-C (B-COMPLEX WITH VITAMIN C) tablet Take 1 tablet by mouth daily.      . Cholecalciferol (VITAMIN D) 2000 UNITS CAPS Take 1 capsule by mouth daily.      Marland Kitchen Fexofenadine HCl (ALLEGRA PO) Take 1 tablet by mouth daily.       . fluconazole (DIFLUCAN) 150 MG tablet Take 150 mg by mouth as  needed.       Marland Kitchen L-Methylfolate-B6-B12 (METANX) 3-35-2 MG TABS Take 1 tablet by mouth daily. One tablet by mouth twice daily      . levETIRAcetam (KEPPRA) 500 MG tablet Take 1 tablet (500 mg total) by mouth 2 (two) times daily.  60 tablet  12  . losartan (COZAAR) 25 MG tablet Take 25 mg by mouth every evening.       . vitamin B-12 (CYANOCOBALAMIN) 1000 MCG tablet Take 1,000 mcg by mouth daily.          Results for orders placed during the hospital encounter of 06/27/13 (from the past 48 hour(s))  POCT I-STAT, CHEM 8     Status: Abnormal   Collection Time    06/27/13 12:07 PM      Result Value Ref Range   Sodium 143  137 - 147 mEq/L   Potassium 3.6 (*) 3.7 - 5.3 mEq/L   Chloride 107  96 - 112 mEq/L   BUN 14  6 - 23 mg/dL   Creatinine, Ser 0.80  0.50 - 1.10 mg/dL   Glucose, Bld 84  70 - 99 mg/dL   Calcium, Ion  1.15  1.12 - 1.23 mmol/L   TCO2 23  0 - 100 mmol/L   Hemoglobin 13.6  12.0 - 15.0 g/dL   HCT 40.0  36.0 - 46.0 %   No results found.  ROS  No recent f/c/nv/wt loss  Blood pressure 109/69, pulse 69, temperature 97.7 F (36.5 C), temperature source Oral, resp. rate 20, height _0  (1.6 m), weight 62.596 kg (138 lb), SpO2 98.00%. Physical Exam  wn wd woman in nad.  A and Ox 4.  Mood anda ffect normal.  EOMI.  resp unlabored.  L foot with TTP at the 5th MT base.  Skin healthy and intact.  Sens to LT intact.  5/5 strength in PF and DF of tha nkle atn toes..  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  To OR for operative treatment of the nonunion.  The risks and benefits of the alternative treatment options have been discussed in detail.  The patient wishes to proceed with surgery and specifically understands risks of bleeding, infection, nerve damage, blood clots, need for additional surgery, amputation and death.   Wylene Simmer 2013-07-19, 12:10 PM

## 2013-06-27 NOTE — Discharge Instructions (Signed)
Nancy ArthursJohn Hewitt, MD [facility]  Please read the following information regarding your care after surgery.  Medications  You only need a prescription for the narcotic pain medicine (ex. oxycodone, Percocet, Norco).  All of the other medicines listed below are available over the counter. X acetominophen (Tylenol) 650 mg every 4-6 hours as you need for minor pain OR X norco as prescribed for more severe pain.   Narcotic pain medicine (ex. oxycodone, Percocet, Vicodin) will cause constipation.  To prevent this problem, take the following medicines while you are taking any pain medicine. X docusate sodium (Colace) 100 mg twice a day X senna (Senokot) 2 tablets twice a day  X To help prevent blood clots, take an aspirin (325 mg) once a day for a month after surgery.  You should also get up every hour while you are awake to move around.    Weight Bearing ? Bear weight when you are able on your operated leg or foot. ? Bear weight only on the heel of your operated foot in the post-op shoe. X Do not bear any weight on the operated leg or foot.  Cast / Splint / Dressing X Keep your splint or cast clean and dry.  Dont put anything (coat hanger, pencil, etc) down inside of it.  If it gets damp, use a hair dryer on the cool setting to dry it.  If it gets soaked, call the office to schedule an appointment for a cast change. ? Remove your dressing 3 days after surgery and cover the incisions with dry dressings.    After your dressing, cast or splint is removed; you may shower, but do not soak or scrub the wound.  Allow the water to run over it, and then gently pat it dry.  Swelling It is normal for you to have swelling where you had surgery.  To reduce swelling and pain, keep your toes above your nose for at least 3 days after surgery.  It may be necessary to keep your foot or leg elevated for several weeks.  If it hurts, it should be elevated.  Follow Up Call my office at [phone] when  you are discharged from the hospital or surgery center to schedule an appointment to be seen two weeks after surgery.  Call my office at [phone] if you develop a fever >101.5 F, nausea, vomiting, bleeding from the surgical site or severe pain.      Regional Anesthesia Blocks  1. Numbness or the inability to move the "blocked" extremity may last from 3-48 hours after placement. The length of time depends on the medication injected and your individual response to the medication. If the numbness is not going away after 48 hours, call your surgeon.  2. The extremity that is blocked will need to be protected until the numbness is gone and the  Strength has returned. Because you cannot feel it, you will need to take extra care to avoid injury. Because it may be weak, you may have difficulty moving it or using it. You may not know what position it is in without looking at it while the block is in effect.  3. For blocks in the legs and feet, returning to weight bearing and walking needs to be done carefully. You will need to wait until the numbness is entirely gone and the strength has returned. You should be able to move your leg and foot normally before you try and bear weight or walk. You will need someone to be  with you when you first try to ensure you do not fall and possibly risk injury.  4. Bruising and tenderness at the needle site are common side effects and will resolve in a few days.  5. Persistent numbness or new problems with movement should be communicated to the surgeon or the [facility] [phone] [facility] [phone]).    Post Anesthesia Home Care Instructions  Activity: Get plenty of rest for the remainder of the day. A responsible adult should stay with you for 24 hours following the procedure.  For the next 24 hours, DO NOT: -Drive a car -Advertising copywriter -Drink alcoholic beverages -Take any medication unless instructed by your  physician -Make any legal decisions or sign important papers.  Meals: Start with liquid foods such as gelatin or soup. Progress to regular foods as tolerated. Avoid greasy, spicy, heavy foods. If nausea and/or vomiting occur, drink only clear liquids until the nausea and/or vomiting subsides. Call your physician if vomiting continues.  Special Instructions/Symptoms: Your throat may feel dry or sore from the anesthesia or the breathing tube placed in your throat during surgery. If this causes discomfort, gargle with warm salt water. The discomfort should disappear within 24 hours.

## 2013-06-27 NOTE — Transfer of Care (Signed)
Immediate Anesthesia Transfer of Care Note  Patient: [name]  Procedure(s) Performed: Procedure(s): OPEN REDUCTION INTERNAL FIXATION (ORIF) LEFT FIFTH METATARSAL (Left)  Patient Location: PACU  Anesthesia Type:General and GA combined with regional for post-op pain  Level of Consciousness: awake  Airway & Oxygen Therapy: Patient Spontanous Breathing and Patient connected to face mask oxygen  Post-op Assessment: Report given to PACU RN and Post -op Vital signs reviewed and stable  Post vital signs: Reviewed and stable  Complications: No apparent anesthesia complications

## 2013-06-28 NOTE — Op Note (Signed)
NAMEMOSETTA, FERDINAND                ACCOUNT NO.:  [id number]  MEDICAL RECORD NO.:  [id number]  LOCATION:                                FACILITY:  [facility]  PHYSICIAN:  Toni Arthurs, MD        DATE OF BIRTH:  04/21/1960  DATE OF PROCEDURE:  06/27/2013 DATE OF DISCHARGE:  06/27/2013                              OPERATIVE REPORT   PREOPERATIVE DIAGNOSIS:  Left fifth metatarsal Jones fracture, nonunion.  POSTOPERATIVE DIAGNOSIS:  Left fifth metatarsal Jones fracture, nonunion.  PROCEDURE: 1. Repair of right fifth metatarsal fracture nonunion. 2. AP, lateral, and oblique radiographs of the right foot.  SURGEON:  Toni Arthurs, MD  ANESTHESIA:  General, regional.  ESTIMATED BLOOD LOSS:  Minimal.  TOURNIQUET TIME:  Zero.  COMPLICATIONS:  None.  DISPOSITION:  Extubated awake and stable to recovery.  INDICATIONS FOR PROCEDURE:  The patient is a 53 year old woman with a history of a fifth metatarsal Jones fracture, sustained a work injury last year.  She has failed to heal despite nonweightbearing and weightbearing immobilization as well as the use of a bone stimulator. She still has pain at the site of the nonunion.  She presents now for operative repair of this nonunion of the left fifth metatarsal base Jones fracture.  She understands the risks and benefits, the alternative treatment options and elects surgical treatment.  She specifically understands risks of bleeding, infection, nerve damage, blood clots, need for additional surgery, continued pain, nonunion, amputation, and death.  PROCEDURE IN DETAIL:  After preoperative consent was obtained, the correct operative site was identified.  The patient was brought to the operating room and placed supine on the operating table.  General anesthesia was induced.  Preoperative antibiotics were administered. Surgical time-out was taken.  Left lower extremity was prepped and draped in standard sterile fashion.  A K-wire from the Arth[employer]  Jones fracture screw set was selected.  This was inserted percutaneously to the high and inside position at the fifth metatarsal base.  It was advanced through the cortical bone and down the medullary shaft.  AP and lateral radiographs confirmed appropriate position of the guide pin. Longitudinal incision was then made at the insertion point.  Sharp dissection was carried down through skin and subcutaneous tissue.  The cannulated drill bit was used to over drill the guide pin across the fracture site.  The 4.5 and 5.5 mm taps were then used and advanced down to the level of the medullary canal distal to the fracture site.  A 40 mm x 5.5 mm solid screw was then inserted after removing the guide pin. The screw was noted to have excellent purchase.  AP oblique and lateral radiographs confirmed appropriate position of the screw and appropriate compression of the fracture site.  The wound was irrigated copiously. Horizontal mattress sutures of 3-0 nylon were used to close the incision.  Sterile dressings were applied followed by a well-padded short-leg splint.  The patient was awakened by Anesthesia and transported to recovery room in stable condition.  FOLLOWUP PLAN: 1. The patient will be nonweightbearing on the left foot in a splint     for 2 weeks. 2. She goes  back to see me in clinic.  At that point, we will plan to     remove the sutures and likely put her in a CAM boot bearing weight     as tolerated.  RADIOGRAPHS:  AP oblique and lateral radiographs of the left foot are obtained intraoperatively today.  These show interval insertion of a partially threaded screw across the nonunion site.  No acute injury is noted.  Alignment is grossly normal.     Toni ArthursJohn Hewitt, MD     JH/MEDQ  D:  06/27/2013  T:  06/27/2013  Job:  [id number]

## 2013-07-02 ENCOUNTER — Encounter (HOSPITAL_BASED_OUTPATIENT_CLINIC_OR_DEPARTMENT_OTHER): Payer: Self-pay | Admitting: Orthopedic Surgery

## 2013-12-29 ENCOUNTER — Emergency Department (HOSPITAL_COMMUNITY)
Admission: EM | Admit: 2013-12-29 | Discharge: 2013-12-30 | Disposition: A | Discharge status: Home / Self Care | Payer: 59 | Attending: Emergency Medicine | Admitting: Emergency Medicine

## 2013-12-29 ENCOUNTER — Emergency Department (HOSPITAL_COMMUNITY): Payer: 59

## 2013-12-29 ENCOUNTER — Encounter (HOSPITAL_COMMUNITY): Payer: Self-pay | Admitting: *Deleted

## 2013-12-29 DIAGNOSIS — K219 Gastro-esophageal reflux disease without esophagitis: Secondary | ICD-10-CM | POA: Insufficient documentation

## 2013-12-29 DIAGNOSIS — R1030 Lower abdominal pain, unspecified: Secondary | ICD-10-CM

## 2013-12-29 DIAGNOSIS — Z86010 Personal history of colonic polyps: Secondary | ICD-10-CM | POA: Diagnosis not present

## 2013-12-29 DIAGNOSIS — I10 Essential (primary) hypertension: Secondary | ICD-10-CM | POA: Insufficient documentation

## 2013-12-29 DIAGNOSIS — Z79899 Other long term (current) drug therapy: Secondary | ICD-10-CM | POA: Insufficient documentation

## 2013-12-29 DIAGNOSIS — R269 Unspecified abnormalities of gait and mobility: Secondary | ICD-10-CM | POA: Insufficient documentation

## 2013-12-29 DIAGNOSIS — R262 Difficulty in walking, not elsewhere classified: Secondary | ICD-10-CM

## 2013-12-29 DIAGNOSIS — D649 Anemia, unspecified: Secondary | ICD-10-CM | POA: Diagnosis not present

## 2013-12-29 DIAGNOSIS — Z87440 Personal history of urinary (tract) infections: Secondary | ICD-10-CM | POA: Insufficient documentation

## 2013-12-29 DIAGNOSIS — G40909 Epilepsy, unspecified, not intractable, without status epilepticus: Secondary | ICD-10-CM | POA: Insufficient documentation

## 2013-12-29 LAB — LIPASE, BLOOD: Lipase: 38 U/L (ref 11–59)

## 2013-12-29 LAB — COMPREHENSIVE METABOLIC PANEL
ALT: 15 U/L (ref 0–35)
ANION GAP: 14 (ref 5–15)
AST: 19 U/L (ref 0–37)
Albumin: 3.8 g/dL (ref 3.5–5.2)
Alkaline Phosphatase: 98 U/L (ref 39–117)
BILIRUBIN TOTAL: 0.4 mg/dL (ref 0.3–1.2)
BUN: 11 mg/dL (ref 6–23)
CHLORIDE: 102 meq/L (ref 96–112)
CO2: 26 meq/L (ref 19–32)
Calcium: 9.3 mg/dL (ref 8.4–10.5)
Creatinine, Ser: 0.5 mg/dL (ref 0.50–1.10)
GLUCOSE: 91 mg/dL (ref 70–99)
Potassium: 3.8 mEq/L (ref 3.7–5.3)
SODIUM: 142 meq/L (ref 137–147)
TOTAL PROTEIN: 6.7 g/dL (ref 6.0–8.3)

## 2013-12-29 LAB — CBC WITH DIFFERENTIAL/PLATELET
BASOS PCT: 0 % (ref 0–1)
Basophils Absolute: 0 10*3/uL (ref 0.0–0.1)
EOS ABS: 0 10*3/uL (ref 0.0–0.7)
Eosinophils Relative: 0 % (ref 0–5)
HCT: 36.5 % (ref 36.0–46.0)
Hemoglobin: 12.2 g/dL (ref 12.0–15.0)
Lymphocytes Relative: 16 % (ref 12–46)
Lymphs Abs: 1.3 10*3/uL (ref 0.7–4.0)
MCH: 30.8 pg (ref 26.0–34.0)
MCHC: 33.4 g/dL (ref 30.0–36.0)
MCV: 92.2 fL (ref 78.0–100.0)
MONOS PCT: 6 % (ref 3–12)
Monocytes Absolute: 0.4 10*3/uL (ref 0.1–1.0)
NEUTROS PCT: 78 % — AB (ref 43–77)
Neutro Abs: 6.1 10*3/uL (ref 1.7–7.7)
PLATELETS: 212 10*3/uL (ref 150–400)
RBC: 3.96 MIL/uL (ref 3.87–5.11)
RDW: 13.7 % (ref 11.5–15.5)
WBC: 7.8 10*3/uL (ref 4.0–10.5)

## 2013-12-29 LAB — URINALYSIS, ROUTINE W REFLEX MICROSCOPIC
Bilirubin Urine: NEGATIVE
GLUCOSE, UA: NEGATIVE mg/dL
Ketones, ur: NEGATIVE mg/dL
Leukocytes, UA: NEGATIVE
Nitrite: NEGATIVE
PROTEIN: NEGATIVE mg/dL
Specific Gravity, Urine: 1.008 (ref 1.005–1.030)
UROBILINOGEN UA: 0.2 mg/dL (ref 0.0–1.0)
pH: 5 (ref 5.0–8.0)

## 2013-12-29 LAB — POC OCCULT BLOOD, ED: FECAL OCCULT BLD: NEGATIVE

## 2013-12-29 LAB — PROTIME-INR
INR: 0.99 (ref 0.00–1.49)
Prothrombin Time: 13.1 seconds (ref 11.6–15.2)

## 2013-12-29 LAB — URINE MICROSCOPIC-ADD ON

## 2013-12-29 LAB — TROPONIN I: Troponin I: <0.3 ng/mL (ref <0.30)

## 2013-12-29 MED ORDER — KETOROLAC TROMETHAMINE 30 MG/ML IJ SOLN
30.0000 mg | Freq: Once | INTRAMUSCULAR | Status: AC
  Administered 2013-12-29: 30 mg via INTRAVENOUS
  Filled 2013-12-29: qty 1

## 2013-12-29 MED ORDER — SODIUM CHLORIDE 0.9 % IV SOLN
INTRAVENOUS | Status: DC
  Administered 2013-12-29: 19:00:00 via INTRAVENOUS

## 2013-12-29 MED ORDER — DIAZEPAM 5 MG/ML IJ SOLN
5.0000 mg | Freq: Once | INTRAMUSCULAR | Status: AC
  Administered 2013-12-29: 22:00:00 via INTRAVENOUS
  Filled 2013-12-29: qty 2

## 2013-12-29 MED ORDER — GADOBENATE DIMEGLUMINE 529 MG/ML IV SOLN
13.0000 mL | Freq: Once | INTRAVENOUS | Status: AC | PRN
  Administered 2013-12-29: 13 mL via INTRAVENOUS

## 2013-12-29 MED ORDER — MORPHINE SULFATE 4 MG/ML IJ SOLN
4.0000 mg | Freq: Once | INTRAMUSCULAR | Status: AC
  Administered 2013-12-29: 4 mg via INTRAVENOUS
  Filled 2013-12-29: qty 1

## 2013-12-29 MED ORDER — ONDANSETRON HCL 4 MG/2ML IJ SOLN
4.0000 mg | Freq: Once | INTRAMUSCULAR | Status: AC
  Administered 2013-12-29: 4 mg via INTRAVENOUS
  Filled 2013-12-29: qty 2

## 2013-12-29 MED ORDER — IOHEXOL 300 MG/ML  SOLN
100.0000 mL | Freq: Once | INTRAMUSCULAR | Status: AC | PRN
  Administered 2013-12-29: 100 mL via INTRAVENOUS

## 2013-12-29 MED ORDER — DIAZEPAM 5 MG/ML IJ SOLN
5.0000 mg | Freq: Once | INTRAMUSCULAR | Status: AC
  Administered 2013-12-29: 5 mg via INTRAVENOUS

## 2013-12-29 NOTE — ED Notes (Signed)
Patient transported to CT 

## 2013-12-29 NOTE — ED Provider Notes (Signed)
CSN: [id number]     Arrival date & time 12/29/13  1753 History   First MD Initiated Contact with Patient 12/29/13 1805     Chief Complaint  Patient presents with  . Pain     (Consider location/radiation/quality/duration/timing/severity/associated sxs/prior Treatment) HPI Patient started having symptoms 6 days ago. She reports she was having some discomfort in her groin and lower abdomen but nothing like it is now. At onset it was mild and it has progressed to very severe. The patient reports some degree of waxing and waning numbness in her feet as a chronic problem. She however has developed pain that seems to start in the inguinal region and encompasses up for portion of her legs to be quite severe. She reports also she is noting swelling in both of her legs. As of today the pain has gotten so severe in her inguinal region and upper legs that she is unable to walk. This appears to be more so due to pain limitation then frank neurologic weakness. She has not had bladder incontinence or urinary retention. She denies bowel dysfunction. Past Medical History  Diagnosis Date  . GERD (gastroesophageal reflux disease)   . Hypertension   . Hyperplastic colon polyp   . HLA B27 (HLA B27 positive)   . IBS (irritable bowel syndrome)   . Hx: UTI (urinary tract infection)   . Allergy     seasonal  . Anemia   . Seizures     hx epilepsy-last seizure 9/14 after taking tramadol   Past Surgical History  Procedure Laterality Date  . Partial hysterectomy  2006  . Tubal ligation    . Appendectomy    . Bladder surgery  2007  . Cesarean section      x 2   . Tonsillectomy    . Orif toe fracture Left 06/27/2013    Procedure: OPEN REDUCTION INTERNAL FIXATION (ORIF) LEFT FIFTH METATARSAL;  Surgeon: Wylene Simmer, MD;  Location: [facility];  Service: Orthopedics;  Laterality: Left;   Family History  Problem Relation Age of Onset  . Heart disease Father   . Breast cancer Maternal Grandmother    . Diabetes Maternal Aunt   . Colon cancer Neg Hx   . Irritable bowel syndrome Sister   . Irritable bowel syndrome Brother    History  Substance Use Topics  . Smoking status: Current Every Day Smoker -- 1.00 packs/day for 30 years    Types: Cigarettes  . Smokeless tobacco: Never Used  . Alcohol Use: 0.6 oz/week    1 Glasses of wine per week     Comment: OCC.   OB History    No data available     Review of Systems    Allergies  Lamictal; Other; Shrimp; and Tramadol  Home Medications   Prior to Admission medications   Medication Sig Start Date End Date Taking? Authorizing Provider  B Complex-C (B-COMPLEX WITH VITAMIN C) tablet Take 1 tablet by mouth daily.   Yes Historical Provider, MD  Cholecalciferol (VITAMIN D) 2000 UNITS CAPS Take 1 capsule by mouth daily.   Yes Historical Provider, MD  Dimethyl Fumarate (TECFIDERA) 240 MG CPDR Take 1 capsule by mouth every evening.   Yes Historical Provider, MD  Fexofenadine HCl (ALLEGRA PO) Take 1 tablet by mouth daily.    Yes Historical Provider, MD  hydrochlorothiazide (HYDRODIURIL) 25 MG tablet Take 12.5 mg by mouth daily.   Yes Historical Provider, MD  HYDROcodone-acetaminophen (NORCO) 5-325 MG per tablet Take 1-2 tablets  by mouth every 6 (six) hours as needed. 06/27/13  Yes Wylene Simmer, MD  ibuprofen (ADVIL,MOTRIN) 200 MG tablet Take 200 mg by mouth every 6 (six) hours as needed for moderate pain.   Yes Historical Provider, MD  L-Methylfolate-B6-B12 (METANX) 3-35-2 MG TABS Take 1 tablet by mouth daily. One tablet by mouth twice daily 08/31/10  Yes Historical Provider, MD  levETIRAcetam (KEPPRA) 500 MG tablet Take 1 tablet (500 mg total) by mouth 2 (two) times daily. 10/19/12  Yes Marcial Pacas, MD  losartan (COZAAR) 25 MG tablet Take 25 mg by mouth every evening.    Yes Historical Provider, MD  naphazoline-pheniramine (NAPHCON-A) 0.025-0.3 % ophthalmic solution Place 1 drop into both eyes daily as needed for irritation.   Yes Historical  Provider, MD  omeprazole (PRILOSEC) 40 MG capsule Take 40 mg by mouth daily.   Yes Historical Provider, MD  vitamin B-12 (CYANOCOBALAMIN) 1000 MCG tablet Take 1,000 mcg by mouth daily.     Yes Historical Provider, MD  vitamin C (ASCORBIC ACID) 500 MG tablet Take 500 mg by mouth daily.   Yes Historical Provider, MD  acetaminophen (TYLENOL) 500 MG tablet Take 500 mg by mouth 2 (two) times daily.    Historical Provider, MD  diazepam (VALIUM) 5 MG tablet Take 1 tablet (5 mg total) by mouth every 8 (eight) hours as needed for anxiety. Every 8 hours as needed for muscle spasm. 12/30/13   Charlesetta Shanks, MD  fluconazole (DIFLUCAN) 150 MG tablet Take 150 mg by mouth as needed.  07/18/12   Historical Provider, MD  naproxen (NAPROSYN) 500 MG tablet Take 1 tablet (500 mg total) by mouth 2 (two) times daily. 12/30/13   Charlesetta Shanks, MD   BP 108/68 mmHg  Pulse 76  Temp(Src) 98.3 F (36.8 C) (Oral)  Resp 18  SpO2 96% Physical Exam  Constitutional: She is oriented to person, place, and time. She appears well-developed and well-nourished.  HENT:  Head: Normocephalic and atraumatic.  Eyes: EOM are normal. Pupils are equal, round, and reactive to light.  Neck: Neck supple.  Cardiovascular: Normal rate, regular rhythm, normal heart sounds and intact distal pulses.   Pulmonary/Chest: Effort normal and breath sounds normal.  Abdominal: Soft. Bowel sounds are normal. She exhibits no distension and no mass. There is tenderness (patient endorsesmoderate suprapubic tenderness and tenderness in  groin bilaterally. There is no abnormal palpable mass. Femoral pulses are symmetric and normal.). There is no rebound and no guarding.  Genitourinary:  Rectal tone is normal.  Musculoskeletal: Normal range of motion. She exhibits no edema or tenderness.  Neurological: She is alert and oriented to person, place, and time. She has normal strength. Coordination normal. GCS eye subscore is 4. GCS verbal subscore is 5. GCS motor  subscore is 6.  The patient is able to plantar and dorsiflex at approximately 4 out of 5 for muscle strength. She can independently raise each leg off the bed but reports weakness and pain with doing so. Sensation is intact to light touch. She reports severe pain with hip flexion and straight leg raise bilaterally.  Skin: Skin is warm, dry and intact.  Psychiatric: She has a normal mood and affect.    ED Course  Procedures (including critical care time) Labs Review Labs Reviewed  CBC WITH DIFFERENTIAL - Abnormal; Notable for the following:    Neutrophils Relative % 78 (*)    All other components within normal limits  URINALYSIS, ROUTINE W REFLEX MICROSCOPIC - Abnormal; Notable for the following:  Hgb urine dipstick MODERATE (*)    All other components within normal limits  URINE MICROSCOPIC-ADD ON - Abnormal; Notable for the following:    Squamous Epithelial / LPF FEW (*)    All other components within normal limits  COMPREHENSIVE METABOLIC PANEL  LIPASE, BLOOD  PROTIME-INR  TROPONIN I  POC OCCULT BLOOD, ED    Imaging Review Mr Lumbar Spine W Wo Contrast (assess For Abscess, Cord Compression)  12/29/2013   CLINICAL DATA:  Bilateral groin and lower abdominal pain and pressure for 2 days radiating to upper legs with foot swelling. Difficulty ambulating due to lower extremity weakness.  EXAM: MRI LUMBAR SPINE WITHOUT AND WITH CONTRAST  TECHNIQUE: Multiplanar and multiecho pulse sequences of the lumbar spine were obtained without and with intravenous contrast.  CONTRAST:  58m MULTIHANCE GADOBENATE DIMEGLUMINE 529 MG/ML IV SOLN  COMPARISON:  CT of the abdomen and pelvis December 29, 2013 at 1955 hr  FINDINGS: Lumbar vertebral bodies and posterior elements are intact and aligned with maintenance of lumbar lordosis. Using the reference level of the last well-formed intervertebral disc as L5-S1, mild L4-5 and L5-S1 disc height loss with mild decreased T2 signal consistent with desiccation.  Minimal subacute discogenic endplate changes at LT0-5and L5-S1 ; mild acute enhancing discogenic endplate changes towards the RIGHT at L5-S1. No suspicious osseous or intradiscal enhancement. No STIR signal abnormality to suggest fracture.  Conus medullaris terminates at T12-L1 and appears normal morphology and signal characteristics. Cauda equina is unremarkable. No abnormal cord, leptomeningeal nor epidural enhancement. Bilateral nonenhancing renal cysts.  Level by level evaluation:  T12-L1, L1-2, L2-3, L3-4: No disc bulge, canal stenosis nor neural foraminal narrowing.  L4-5: 2 mm broad-based disc bulge asymmetric to the LEFT. Moderate RIGHT, moderate to severe LEFT facet arthropathy with enhancing pericapsular changes, trace LEFT facet effusion is likely reactive. No canal stenosis. Mild bilateral neural foraminal narrowing.  L5-S1: 1-2 mm annular bulging. Moderate to severe RIGHT, mild LEFT facet arthropathy. No canal stenosis. Mild RIGHT neural foraminal narrowing.  IMPRESSION: No acute fracture or malalignment. No suspicious lumbar spine enhancement.  No canal stenosis.  Mild L4-5 and L5-S1 neural foraminal narrowing.  Lower lumbar facet arthropathy with acute LEFT L4-5 facet inflammation.   Electronically Signed   By: CElon Alas  On: 12/29/2013 23:43   Ct Abdomen Pelvis W Contrast  12/29/2013   CLINICAL DATA:  Bilateral hand pain and lower abdominal pain for 2 days. Difficulty walking.  EXAM: CT ABDOMEN AND PELVIS WITH CONTRAST  TECHNIQUE: Multidetector CT imaging of the abdomen and pelvis was performed using the standard protocol following bolus administration of intravenous contrast.  CONTRAST:  1020mOMNIPAQUE IOHEXOL 300 MG/ML  SOLN  COMPARISON:  MRI 08/11/2010 2007.  FINDINGS: Lower chest:  Lung bases are clear.  No pericardial fluid.  Hepatobiliary: No focal hepatic lesion. No biliary duct dilatation. Gallbladder is normal. Common bile duct is normal.  Pancreas: Pancreas is normal. No  ductal dilatation. No pancreatic inflammation.  Spleen: Normal spleen.  Adrenals/urinary tract: Adrenal glands are normal. 2.5 cm simple fluid attenuation lesions in the right kidney and not changed from remote MRI. Ureters and bladder normal.  Stomach/Bowel: Stomach, small bowel and cecum are normal. Colon and rectosigmoid colon are normal.  Vascular/Lymphatic: Abdominal aorta is normal caliber with atherosclerotic calcification. There is no retroperitoneal or periportal lymphadenopathy. No pelvic lymphadenopathy.  Reproductive: Post hysterectomy anatomy. No adnexal abnormality. No free fluid the pelvis.  Musculoskeletal: There is a mottled appearance to the bones  of the pelvis. This is not changed from comparison MRI of 01/21/2005.  IMPRESSION: 1. No evidence for lower abdominal pain.  No hernia. 2. Atherosclerotic calcification aorta. 3. Mottled appearance of the bones of the spine and pelvis unchanged from prior MRI and likely relates osteoporosis.   Electronically Signed   By: Suzy Bouchard M.D.   On: 12/29/2013 20:07     EKG Interpretation   Date/Time:  Sunday December 29 2013 18:59:53 EST Ventricular Rate:  89 PR Interval:  137 QRS Duration: 85 QT Interval:  357 QTC Calculation: 434 R Axis:   49 Text Interpretation:  Sinus rhythm nomal. no change Confirmed by Rosa Wyly,  MD, Raynor Calcaterra ([id number]) on 12/29/2013 7:48:26 PM     20 :50 the patient reports that she did not get significant pain relief from morphine. She was however able to get up and use the bedside commode. She reports it was still painful however she did have the lower extremity strength to transfer and go from sitting to standing. 00:20 the patient feels improved. She still reports some discomfort in her bilateral groin and lower abdomen. She is however ambulatory with symmetric weightbearing and no motor dysfunction.. MDM   Final diagnoses:  Groin pain, unspecified laterality  Ambulatory dysfunction   Patient's initial  presentation was concerning for neurologic or vascular disorder resulting in gait dysfunction. The patient had a pain pattern that involved both inguinal regions and her very lower abdomen. Due to this she was having difficulty walking. At this point CT does not show any vascular or structural anomaly. The patient is post hysterectomy and all of her labs are normal. MRI has ruled out for any cord lesions. The patient does have significant facet joint disorder however her pain is not acutely in her back so much as bilateral hips, inguinal regions and suprapubic region. At this point with the patient ambulatory and pain controlled and diagnostic studies normal she'll be discharged for follow-up with her family physician.    Charlesetta Shanks, MD 12/30/13 [phone]

## 2013-12-29 NOTE — ED Notes (Signed)
Pt back from MRI 

## 2013-12-29 NOTE — ED Notes (Addendum)
Pt has MS. Reports having bilateral groin pain and lower abd pain/pressure x 2 days, radiates into her upper legs and swelling to feet. Having difficulty ambulating.

## 2013-12-29 NOTE — ED Notes (Signed)
Patient transported to MRI 

## 2013-12-30 MED ORDER — NAPROXEN 500 MG PO TABS: 500.0000 mg | ORAL_TABLET | Freq: Two times a day (BID) | ORAL | Status: DC

## 2013-12-30 MED ORDER — KETOROLAC TROMETHAMINE 30 MG/ML IJ SOLN
30.0000 mg | Freq: Once | INTRAMUSCULAR | Status: AC
  Administered 2013-12-30: 30 mg via INTRAVENOUS
  Filled 2013-12-30: qty 1

## 2013-12-30 MED ORDER — DIAZEPAM 5 MG PO TABS: 5.0000 mg | ORAL_TABLET | Freq: Three times a day (TID) | ORAL | Status: DC | PRN

## 2013-12-30 NOTE — Discharge Instructions (Signed)

## 2013-12-30 NOTE — ED Notes (Signed)
Pt a/o x 4 on d/c in wheelchair with fAMILY

## 2014-05-05 ENCOUNTER — Encounter: Payer: Self-pay | Admitting: Internal Medicine

## 2015-04-22 DIAGNOSIS — N3941 Urge incontinence: Secondary | ICD-10-CM | POA: Diagnosis not present

## 2015-05-06 DIAGNOSIS — G2581 Restless legs syndrome: Secondary | ICD-10-CM | POA: Diagnosis not present

## 2015-05-06 DIAGNOSIS — G35 Multiple sclerosis: Secondary | ICD-10-CM | POA: Diagnosis not present

## 2015-05-06 DIAGNOSIS — R300 Dysuria: Secondary | ICD-10-CM | POA: Diagnosis not present

## 2015-05-06 DIAGNOSIS — R569 Unspecified convulsions: Secondary | ICD-10-CM | POA: Diagnosis not present

## 2015-05-06 DIAGNOSIS — R5381 Other malaise: Secondary | ICD-10-CM | POA: Diagnosis not present

## 2015-05-06 DIAGNOSIS — M5417 Radiculopathy, lumbosacral region: Secondary | ICD-10-CM | POA: Diagnosis not present

## 2015-06-02 ENCOUNTER — Ambulatory Visit (INDEPENDENT_AMBULATORY_CARE_PROVIDER_SITE_OTHER): Payer: BLUE CROSS/BLUE SHIELD | Admitting: Gastroenterology

## 2015-06-02 ENCOUNTER — Encounter: Payer: Self-pay | Admitting: Gastroenterology

## 2015-06-02 VITALS — BP 102/60 | HR 78 | Ht 63.0 in | Wt 133.0 lb

## 2015-06-02 DIAGNOSIS — R1032 Left lower quadrant pain: Secondary | ICD-10-CM | POA: Diagnosis not present

## 2015-06-02 DIAGNOSIS — K5900 Constipation, unspecified: Secondary | ICD-10-CM | POA: Diagnosis not present

## 2015-06-02 DIAGNOSIS — R1031 Right lower quadrant pain: Secondary | ICD-10-CM | POA: Diagnosis not present

## 2015-06-02 NOTE — Progress Notes (Signed)
[name]    [id number]    23-Aug-1960  Primary Care Physician:James Maudie Mercury, MD  Referring Physician: Jani Gravel, MD [address] [place], [place] [address]  Chief complaint: Lower abdominal pain  HPI: 55 year old female previously followed by Dr. Olevia Perches, last seen in September 2012 for diarrhea and fecal incontinence is here with complaints of crampy lower abdominal pain. He noticed change in her bowel habits early this year in January and was getting more constipated with bowel movements about once a week and also had associated bloating in addition to the crampy abdominal pain in the right and left lower quadrant. She has tried probiotic for a few months and has improved her bowel movements to about 2 or 3 a week . Denies any fever or chills . She has crampy abdominal pain about once or twice a week . No nausea or vomiting . Patient is concerned about possible diverticulitis .  Outpatient Encounter Prescriptions as of 06/02/2015  Medication Sig  . acetaminophen (TYLENOL) 500 MG tablet Take 500 mg by mouth 2 (two) times daily.  . Cholecalciferol (VITAMIN D) 2000 UNITS CAPS Take 1 capsule by mouth daily.  . Dimethyl Fumarate (TECFIDERA) 240 MG CPDR Take 1 capsule by mouth every evening.  Marland Kitchen glycopyrrolate (ROBINUL) 1 MG tablet Take 1 mg by mouth 3 (three) times daily.  Marland Kitchen ibuprofen (ADVIL,MOTRIN) 200 MG tablet Take 200 mg by mouth every 6 (six) hours as needed for moderate pain.  Marland Kitchen L-Methylfolate-B6-B12 (METANX) 3-35-2 MG TABS Take 1 tablet by mouth daily. One tablet by mouth twice daily  . levETIRAcetam (KEPPRA) 500 MG tablet Take 1 tablet (500 mg total) by mouth 2 (two) times daily.  Marland Kitchen losartan (COZAAR) 25 MG tablet Take 25 mg by mouth every evening.   . methylphenidate (RITALIN) 10 MG tablet Take 10 mg by mouth 2 (two) times daily.  . naphazoline-pheniramine (NAPHCON-A) 0.025-0.3 % ophthalmic solution Place 1 drop into both eyes daily as needed for irritation.    . vitamin B-12 (CYANOCOBALAMIN) 1000 MCG tablet Take 1,000 mcg by mouth daily.    . [DISCONTINUED] B Complex-C (B-COMPLEX WITH VITAMIN C) tablet Take 1 tablet by mouth daily.  . [DISCONTINUED] vitamin C (ASCORBIC ACID) 500 MG tablet Take 500 mg by mouth daily.  . [DISCONTINUED] diazepam (VALIUM) 5 MG tablet Take 1 tablet (5 mg total) by mouth every 8 (eight) hours as needed for anxiety. Every 8 hours as needed for muscle spasm.  . [DISCONTINUED] Fexofenadine HCl (ALLEGRA PO) Take 1 tablet by mouth daily.   . [DISCONTINUED] fluconazole (DIFLUCAN) 150 MG tablet Take 150 mg by mouth as needed.   . [DISCONTINUED] hydrochlorothiazide (HYDRODIURIL) 25 MG tablet Take 12.5 mg by mouth daily.  . [DISCONTINUED] HYDROcodone-acetaminophen (NORCO) 5-325 MG per tablet Take 1-2 tablets by mouth every 6 (six) hours as needed.  . [DISCONTINUED] naproxen (NAPROSYN) 500 MG tablet Take 1 tablet (500 mg total) by mouth 2 (two) times daily.  . [DISCONTINUED] omeprazole (PRILOSEC) 40 MG capsule Take 40 mg by mouth daily.   No facility-administered encounter medications on file as of 06/02/2015.    Allergies as of 06/02/2015 - Review Complete 12/29/2013  Allergen Reaction Noted  . Lamictal [lamotrigine] Rash 03/22/2013  . Other  12/29/2013  . Shrimp [shellfish allergy]  12/29/2013  . Tramadol  06/24/2013    Past Medical History  Diagnosis Date  . GERD (gastroesophageal reflux disease)   . Hypertension   .  Hyperplastic colon polyp   . HLA B27 (HLA B27 positive)   . IBS (irritable bowel syndrome)   . Hx: UTI (urinary tract infection)   . Allergy     seasonal  . Anemia   . Seizures ([place])     hx epilepsy-last seizure 9/14 after taking tramadol    Past Surgical History  Procedure Laterality Date  . Partial hysterectomy  2006  . Tubal ligation    . Appendectomy    . Bladder surgery  2007  . Cesarean section      x 2   . Tonsillectomy    . Orif toe fracture Left 06/27/2013    Procedure: OPEN  REDUCTION INTERNAL FIXATION (ORIF) LEFT FIFTH METATARSAL;  Surgeon: Wylene Simmer, MD;  Location: [facility];  Service: Orthopedics;  Laterality: Left;    Family History  Problem Relation Age of Onset  . Heart disease Father   . Breast cancer Maternal Grandmother   . Diabetes Maternal Aunt   . Colon cancer Neg Hx   . Irritable bowel syndrome Sister   . Irritable bowel syndrome Brother     Social History   Social History  . Marital Status: Married    Spouse Name: Jori Moll  . Number of Children: 3  . Years of Education: 13   Occupational History  . [employer]    .     Social History Main Topics  . Smoking status: Current Every Day Smoker -- 1.00 packs/day for 30 years    Types: Cigarettes  . Smokeless tobacco: Never Used  . Alcohol Use: 0.6 oz/week    1 Glasses of wine per week     Comment: OCC.  . Drug Use: No  . Sexual Activity: Not on file   Other Topics Concern  . Not on file   Social History Narrative   2 caffeine drinks daily . Patient lives at home with her husband Jori Moll) and her two children.    Patient has three children.   Patient has some a high school education and took some college courses.   Right handed.      Review of systems: Review of Systems  Constitutional: Negative for fever and chills.  HENT: Negative.   Eyes: Negative for blurred vision.  Respiratory: Negative for cough, shortness of breath and wheezing.   Cardiovascular: Negative for chest pain and palpitations.  Gastrointestinal: as per HPI Genitourinary: Negative for dysuria, urgency, frequency and hematuria.  Musculoskeletal: Negative for myalgias, back pain and joint pain.  Skin: Negative for itching and rash.  Neurological: Negative for dizziness, tremors, focal weakness, seizures and loss of consciousness.  Endo/Heme/Allergies: Negative for environmental allergies.  Psychiatric/Behavioral: Negative for depression, suicidal ideas and hallucinations.  All other systems  reviewed and are negative.   Physical Exam: Filed Vitals:   06/02/15 1352  BP: 102/60  Pulse: 78   Gen:      No acute distress HEENT:  EOMI, sclera anicteric Neck:     No masses; no thyromegaly Lungs:    Clear to auscultation bilaterally; normal respiratory effort CV:         Regular rate and rhythm; no murmurs Abd:      + bowel sounds; soft, non-tender; no palpable masses, no distension Ext:    No edema; adequate peripheral perfusion Skin:      Warm and dry; no rash Neuro: alert and oriented x 3 Psych: normal mood and affect  Data Reviewed:  Reviewed chart in epic last colonoscopy in February  2012 biopsies were obtained to rule out microscopic colitis, exam was otherwise normal. Biopsies showed no evidence of microscopic colitis and were normal colonic mucosa  Assessment and Plan/Recommendations:   55 year old female with history of ankylosing spondylitis here with complaints of crampy lower abdominal pain and constipation patient does not want to take any laxatives advised to increase dietary fluid and fiber intake We'll do a trial of probiotic VSL# 3 regular strength 1 packet daily CT abdomen and pelvis with contrast to evaluate the abdominal pain   K. Denzil Magnuson , MD [phone] Mon-Fri 8a-5p [phone] after 5p, weekends, holidays  CC: Jani Gravel, MD

## 2015-06-02 NOTE — Patient Instructions (Addendum)
   We have given you samples and coupons for VSL#3 probiotic, use 450 U daily.  This is OTC and can be purchased cheaper at [facility] at [facility].  You have been scheduled for a CT scan of the abdomen and pelvis at [facility] ([address]---this is in the same building as Press photographer).   You are scheduled on 06/03/15 at 2 pm. You should arrive 15 minutes prior to your appointment time for registration. Please follow the written instructions below on the day of your exam:  WARNING: IF YOU ARE ALLERGIC TO IODINE/X-RAY DYE, PLEASE NOTIFY RADIOLOGY IMMEDIATELY AT [phone]! YOU WILL BE GIVEN A 13 HOUR PREMEDICATION PREP.  1) Do not eat or drink anything after 10 am (4 hours prior to your test) 2) You have been given 2 bottles of oral contrast to drink. The solution may taste better if refrigerated, but do NOT add ice or any other liquid to this solution. Shake well before drinking.    Drink 1 bottle of contrast @ 12 noon (2 hours prior to your exam)  Drink 1 bottle of contrast @ 1 pm (1 hour prior to your exam)  You may take any medications as prescribed with a small amount of water except for the following: Metformin, Glucophage, Glucovance, Avandamet, Riomet, Fortamet, Actoplus Met, Janumet, Glumetza or Metaglip. The above medications must be held the day of the exam AND 48 hours after the exam.  The purpose of you drinking the oral contrast is to aid in the visualization of your intestinal tract. The contrast solution may cause some diarrhea. Before your exam is started, you will be given a small amount of fluid to drink. Depending on your individual set of symptoms, you may also receive an intravenous injection of x-ray contrast/dye. Plan on being at [facility] for 30 minutes or longer, depending on the type of exam you are having performed.  This test typically takes 30-45 minutes to complete.  If you have any questions regarding your exam or  if you need to reschedule, you may call the CT department at [phone] between the hours of 8:00 am and 5:00 pm, Monday-Friday.  ________________________________________________________________________

## 2015-06-03 ENCOUNTER — Inpatient Hospital Stay: Admission: RE | Admit: 2015-06-03 | Payer: Self-pay | Source: Ambulatory Visit

## 2015-06-11 ENCOUNTER — Ambulatory Visit (INDEPENDENT_AMBULATORY_CARE_PROVIDER_SITE_OTHER)
Admission: RE | Admit: 2015-06-11 | Discharge: 2015-06-11 | Disposition: A | Discharge status: Home / Self Care | Payer: BLUE CROSS/BLUE SHIELD | Source: Ambulatory Visit | Attending: Gastroenterology | Admitting: Gastroenterology

## 2015-06-11 DIAGNOSIS — K5900 Constipation, unspecified: Secondary | ICD-10-CM

## 2015-06-11 DIAGNOSIS — R102 Pelvic and perineal pain: Secondary | ICD-10-CM | POA: Diagnosis not present

## 2015-06-11 DIAGNOSIS — R1032 Left lower quadrant pain: Secondary | ICD-10-CM | POA: Diagnosis not present

## 2015-06-11 DIAGNOSIS — R1031 Right lower quadrant pain: Secondary | ICD-10-CM | POA: Diagnosis not present

## 2015-06-11 MED ORDER — IOPAMIDOL (ISOVUE-300) INJECTION 61%
100.0000 mL | Freq: Once | INTRAVENOUS | Status: AC | PRN
  Administered 2015-06-11: 100 mL via INTRAVENOUS

## 2015-06-12 ENCOUNTER — Telehealth: Payer: Self-pay | Admitting: Gastroenterology

## 2015-06-12 NOTE — Telephone Encounter (Signed)
Called back patient and discussed results, no significant abnormality except for incidental finding of renal cysts. She will follow up as needed

## 2015-06-12 NOTE — Telephone Encounter (Signed)
Patient is asking for CT results. Please, advise 

## 2015-06-17 DIAGNOSIS — R569 Unspecified convulsions: Secondary | ICD-10-CM | POA: Diagnosis not present

## 2015-06-17 DIAGNOSIS — R292 Abnormal reflex: Secondary | ICD-10-CM | POA: Diagnosis not present

## 2015-06-17 DIAGNOSIS — G35 Multiple sclerosis: Secondary | ICD-10-CM | POA: Diagnosis not present

## 2015-06-17 DIAGNOSIS — M5412 Radiculopathy, cervical region: Secondary | ICD-10-CM | POA: Diagnosis not present

## 2015-06-17 DIAGNOSIS — G2581 Restless legs syndrome: Secondary | ICD-10-CM | POA: Diagnosis not present

## 2015-06-17 DIAGNOSIS — G5603 Carpal tunnel syndrome, bilateral upper limbs: Secondary | ICD-10-CM | POA: Diagnosis not present

## 2015-06-17 DIAGNOSIS — M5417 Radiculopathy, lumbosacral region: Secondary | ICD-10-CM | POA: Diagnosis not present

## 2015-08-04 DIAGNOSIS — R569 Unspecified convulsions: Secondary | ICD-10-CM | POA: Diagnosis not present

## 2015-08-04 DIAGNOSIS — Z79899 Other long term (current) drug therapy: Secondary | ICD-10-CM | POA: Diagnosis not present

## 2015-08-04 DIAGNOSIS — G2581 Restless legs syndrome: Secondary | ICD-10-CM | POA: Diagnosis not present

## 2015-08-04 DIAGNOSIS — M5417 Radiculopathy, lumbosacral region: Secondary | ICD-10-CM | POA: Diagnosis not present

## 2015-09-24 DIAGNOSIS — Z1589 Genetic susceptibility to other disease: Secondary | ICD-10-CM | POA: Diagnosis not present

## 2015-09-24 DIAGNOSIS — M791 Myalgia: Secondary | ICD-10-CM | POA: Diagnosis not present

## 2015-09-24 DIAGNOSIS — M2550 Pain in unspecified joint: Secondary | ICD-10-CM | POA: Diagnosis not present

## 2015-11-03 DIAGNOSIS — M5417 Radiculopathy, lumbosacral region: Secondary | ICD-10-CM | POA: Diagnosis not present

## 2015-11-03 DIAGNOSIS — G35 Multiple sclerosis: Secondary | ICD-10-CM | POA: Diagnosis not present

## 2015-11-03 DIAGNOSIS — G2581 Restless legs syndrome: Secondary | ICD-10-CM | POA: Diagnosis not present

## 2015-11-03 DIAGNOSIS — R569 Unspecified convulsions: Secondary | ICD-10-CM | POA: Diagnosis not present

## 2015-11-03 DIAGNOSIS — R5381 Other malaise: Secondary | ICD-10-CM | POA: Diagnosis not present

## 2015-12-14 DIAGNOSIS — Z0000 Encounter for general adult medical examination without abnormal findings: Secondary | ICD-10-CM | POA: Diagnosis not present

## 2015-12-14 DIAGNOSIS — E559 Vitamin D deficiency, unspecified: Secondary | ICD-10-CM | POA: Diagnosis not present

## 2015-12-14 DIAGNOSIS — E039 Hypothyroidism, unspecified: Secondary | ICD-10-CM | POA: Diagnosis not present

## 2015-12-21 DIAGNOSIS — I999 Unspecified disorder of circulatory system: Secondary | ICD-10-CM | POA: Diagnosis not present

## 2015-12-21 DIAGNOSIS — Z0000 Encounter for general adult medical examination without abnormal findings: Secondary | ICD-10-CM | POA: Diagnosis not present

## 2016-02-02 ENCOUNTER — Other Ambulatory Visit: Payer: Self-pay | Admitting: Specialist

## 2016-02-02 DIAGNOSIS — G35 Multiple sclerosis: Secondary | ICD-10-CM | POA: Diagnosis not present

## 2016-02-02 DIAGNOSIS — M5417 Radiculopathy, lumbosacral region: Secondary | ICD-10-CM | POA: Diagnosis not present

## 2016-02-02 DIAGNOSIS — M542 Cervicalgia: Secondary | ICD-10-CM | POA: Diagnosis not present

## 2016-02-02 DIAGNOSIS — M5416 Radiculopathy, lumbar region: Secondary | ICD-10-CM

## 2016-02-02 DIAGNOSIS — R2241 Localized swelling, mass and lump, right lower limb: Secondary | ICD-10-CM | POA: Diagnosis not present

## 2016-02-02 DIAGNOSIS — R260 Ataxic gait: Secondary | ICD-10-CM | POA: Diagnosis not present

## 2016-02-03 ENCOUNTER — Other Ambulatory Visit: Payer: Self-pay | Admitting: Specialist

## 2016-02-03 DIAGNOSIS — R52 Pain, unspecified: Secondary | ICD-10-CM

## 2016-02-03 DIAGNOSIS — S2231XA Fracture of one rib, right side, initial encounter for closed fracture: Secondary | ICD-10-CM

## 2016-02-04 ENCOUNTER — Ambulatory Visit
Admission: RE | Admit: 2016-02-04 | Discharge: 2016-02-04 | Disposition: A | Discharge status: Home / Self Care | Payer: BLUE CROSS/BLUE SHIELD | Source: Ambulatory Visit | Attending: Specialist | Admitting: Specialist

## 2016-02-04 DIAGNOSIS — R52 Pain, unspecified: Secondary | ICD-10-CM

## 2016-02-04 DIAGNOSIS — M7989 Other specified soft tissue disorders: Secondary | ICD-10-CM | POA: Diagnosis not present

## 2016-02-04 DIAGNOSIS — S2231XA Fracture of one rib, right side, initial encounter for closed fracture: Secondary | ICD-10-CM

## 2016-02-04 DIAGNOSIS — R079 Chest pain, unspecified: Secondary | ICD-10-CM | POA: Diagnosis not present

## 2016-02-05 DIAGNOSIS — N3941 Urge incontinence: Secondary | ICD-10-CM | POA: Diagnosis not present

## 2016-02-09 DIAGNOSIS — Z1231 Encounter for screening mammogram for malignant neoplasm of breast: Secondary | ICD-10-CM | POA: Diagnosis not present

## 2016-02-11 ENCOUNTER — Ambulatory Visit
Admission: RE | Admit: 2016-02-11 | Discharge: 2016-02-11 | Disposition: A | Discharge status: Home / Self Care | Payer: BLUE CROSS/BLUE SHIELD | Source: Ambulatory Visit | Attending: Specialist | Admitting: Specialist

## 2016-02-11 DIAGNOSIS — G35 Multiple sclerosis: Secondary | ICD-10-CM | POA: Diagnosis not present

## 2016-02-11 DIAGNOSIS — M48061 Spinal stenosis, lumbar region without neurogenic claudication: Secondary | ICD-10-CM | POA: Diagnosis not present

## 2016-02-11 DIAGNOSIS — M5416 Radiculopathy, lumbar region: Secondary | ICD-10-CM

## 2016-02-11 MED ORDER — GADOBENATE DIMEGLUMINE 529 MG/ML IV SOLN
12.0000 mL | Freq: Once | INTRAVENOUS | Status: AC | PRN
  Administered 2016-02-11: 12 mL via INTRAVENOUS

## 2016-02-28 DIAGNOSIS — J069 Acute upper respiratory infection, unspecified: Secondary | ICD-10-CM | POA: Diagnosis not present

## 2016-02-28 DIAGNOSIS — R05 Cough: Secondary | ICD-10-CM | POA: Diagnosis not present

## 2016-05-03 DIAGNOSIS — R569 Unspecified convulsions: Secondary | ICD-10-CM | POA: Diagnosis not present

## 2016-05-03 DIAGNOSIS — G35 Multiple sclerosis: Secondary | ICD-10-CM | POA: Diagnosis not present

## 2016-05-03 DIAGNOSIS — R609 Edema, unspecified: Secondary | ICD-10-CM | POA: Diagnosis not present

## 2016-05-03 DIAGNOSIS — G4700 Insomnia, unspecified: Secondary | ICD-10-CM | POA: Diagnosis not present

## 2016-05-03 DIAGNOSIS — M5417 Radiculopathy, lumbosacral region: Secondary | ICD-10-CM | POA: Diagnosis not present

## 2016-05-10 DIAGNOSIS — R39198 Other difficulties with micturition: Secondary | ICD-10-CM | POA: Diagnosis not present

## 2016-07-05 DIAGNOSIS — M5412 Radiculopathy, cervical region: Secondary | ICD-10-CM | POA: Diagnosis not present

## 2016-07-05 DIAGNOSIS — M5417 Radiculopathy, lumbosacral region: Secondary | ICD-10-CM | POA: Diagnosis not present

## 2016-07-05 DIAGNOSIS — G2581 Restless legs syndrome: Secondary | ICD-10-CM | POA: Diagnosis not present

## 2016-07-05 DIAGNOSIS — G5603 Carpal tunnel syndrome, bilateral upper limbs: Secondary | ICD-10-CM | POA: Diagnosis not present

## 2016-07-05 DIAGNOSIS — G35 Multiple sclerosis: Secondary | ICD-10-CM | POA: Diagnosis not present

## 2016-07-05 DIAGNOSIS — G4700 Insomnia, unspecified: Secondary | ICD-10-CM | POA: Diagnosis not present

## 2016-07-05 DIAGNOSIS — R609 Edema, unspecified: Secondary | ICD-10-CM | POA: Diagnosis not present

## 2016-08-02 DIAGNOSIS — G4700 Insomnia, unspecified: Secondary | ICD-10-CM | POA: Diagnosis not present

## 2016-08-02 DIAGNOSIS — G35 Multiple sclerosis: Secondary | ICD-10-CM | POA: Diagnosis not present

## 2016-08-02 DIAGNOSIS — G2581 Restless legs syndrome: Secondary | ICD-10-CM | POA: Diagnosis not present

## 2016-08-02 DIAGNOSIS — M5417 Radiculopathy, lumbosacral region: Secondary | ICD-10-CM | POA: Diagnosis not present

## 2016-08-02 DIAGNOSIS — R569 Unspecified convulsions: Secondary | ICD-10-CM | POA: Diagnosis not present

## 2016-09-07 DIAGNOSIS — M545 Low back pain: Secondary | ICD-10-CM | POA: Diagnosis not present

## 2016-09-07 DIAGNOSIS — M5137 Other intervertebral disc degeneration, lumbosacral region: Secondary | ICD-10-CM | POA: Diagnosis not present

## 2016-09-21 DIAGNOSIS — M5137 Other intervertebral disc degeneration, lumbosacral region: Secondary | ICD-10-CM | POA: Diagnosis not present

## 2016-09-27 DIAGNOSIS — L309 Dermatitis, unspecified: Secondary | ICD-10-CM | POA: Diagnosis not present

## 2016-09-27 DIAGNOSIS — L304 Erythema intertrigo: Secondary | ICD-10-CM | POA: Diagnosis not present

## 2016-09-30 DIAGNOSIS — M5137 Other intervertebral disc degeneration, lumbosacral region: Secondary | ICD-10-CM | POA: Diagnosis not present

## 2016-10-11 DIAGNOSIS — L240 Irritant contact dermatitis due to detergents: Secondary | ICD-10-CM | POA: Diagnosis not present

## 2016-10-11 DIAGNOSIS — Z23 Encounter for immunization: Secondary | ICD-10-CM | POA: Diagnosis not present

## 2016-10-18 DIAGNOSIS — G4700 Insomnia, unspecified: Secondary | ICD-10-CM | POA: Diagnosis not present

## 2016-10-18 DIAGNOSIS — F419 Anxiety disorder, unspecified: Secondary | ICD-10-CM | POA: Diagnosis not present

## 2016-10-18 DIAGNOSIS — L309 Dermatitis, unspecified: Secondary | ICD-10-CM | POA: Diagnosis not present

## 2016-10-20 DIAGNOSIS — M5137 Other intervertebral disc degeneration, lumbosacral region: Secondary | ICD-10-CM | POA: Diagnosis not present

## 2016-11-02 DIAGNOSIS — R569 Unspecified convulsions: Secondary | ICD-10-CM | POA: Diagnosis not present

## 2016-11-02 DIAGNOSIS — R609 Edema, unspecified: Secondary | ICD-10-CM | POA: Diagnosis not present

## 2016-11-02 DIAGNOSIS — M5417 Radiculopathy, lumbosacral region: Secondary | ICD-10-CM | POA: Diagnosis not present

## 2016-11-02 DIAGNOSIS — G35 Multiple sclerosis: Secondary | ICD-10-CM | POA: Diagnosis not present

## 2016-12-14 DIAGNOSIS — Z0000 Encounter for general adult medical examination without abnormal findings: Secondary | ICD-10-CM | POA: Diagnosis not present

## 2017-01-17 DIAGNOSIS — M5417 Radiculopathy, lumbosacral region: Secondary | ICD-10-CM | POA: Diagnosis not present

## 2017-01-17 DIAGNOSIS — R569 Unspecified convulsions: Secondary | ICD-10-CM | POA: Diagnosis not present

## 2017-01-17 DIAGNOSIS — G35 Multiple sclerosis: Secondary | ICD-10-CM | POA: Diagnosis not present

## 2017-01-17 DIAGNOSIS — G2581 Restless legs syndrome: Secondary | ICD-10-CM | POA: Diagnosis not present

## 2017-02-09 DIAGNOSIS — Z1231 Encounter for screening mammogram for malignant neoplasm of breast: Secondary | ICD-10-CM | POA: Diagnosis not present

## 2017-03-03 DIAGNOSIS — G4700 Insomnia, unspecified: Secondary | ICD-10-CM | POA: Diagnosis not present

## 2017-03-03 DIAGNOSIS — Z0000 Encounter for general adult medical examination without abnormal findings: Secondary | ICD-10-CM | POA: Diagnosis not present

## 2017-04-19 ENCOUNTER — Other Ambulatory Visit: Payer: Self-pay | Admitting: Specialist

## 2017-04-20 ENCOUNTER — Other Ambulatory Visit: Payer: Self-pay | Admitting: Specialist

## 2017-04-20 DIAGNOSIS — M5417 Radiculopathy, lumbosacral region: Secondary | ICD-10-CM

## 2017-04-27 ENCOUNTER — Ambulatory Visit
Admission: RE | Admit: 2017-04-27 | Discharge: 2017-04-27 | Disposition: A | Discharge status: Home / Self Care | Payer: BLUE CROSS/BLUE SHIELD | Source: Ambulatory Visit | Attending: Specialist | Admitting: Specialist

## 2017-04-27 DIAGNOSIS — M48061 Spinal stenosis, lumbar region without neurogenic claudication: Secondary | ICD-10-CM | POA: Diagnosis not present

## 2017-04-27 DIAGNOSIS — M5417 Radiculopathy, lumbosacral region: Secondary | ICD-10-CM

## 2017-05-02 DIAGNOSIS — G35 Multiple sclerosis: Secondary | ICD-10-CM | POA: Diagnosis not present

## 2017-05-02 DIAGNOSIS — M459 Ankylosing spondylitis of unspecified sites in spine: Secondary | ICD-10-CM | POA: Diagnosis not present

## 2017-05-17 DIAGNOSIS — G8929 Other chronic pain: Secondary | ICD-10-CM | POA: Diagnosis not present

## 2017-05-17 DIAGNOSIS — M5136 Other intervertebral disc degeneration, lumbar region: Secondary | ICD-10-CM | POA: Diagnosis not present

## 2017-05-17 DIAGNOSIS — M47816 Spondylosis without myelopathy or radiculopathy, lumbar region: Secondary | ICD-10-CM | POA: Diagnosis not present

## 2017-05-17 DIAGNOSIS — M545 Low back pain: Secondary | ICD-10-CM | POA: Diagnosis not present

## 2017-05-23 DIAGNOSIS — Z79899 Other long term (current) drug therapy: Secondary | ICD-10-CM | POA: Diagnosis not present

## 2017-05-23 DIAGNOSIS — G35 Multiple sclerosis: Secondary | ICD-10-CM | POA: Diagnosis not present

## 2017-05-23 DIAGNOSIS — M5417 Radiculopathy, lumbosacral region: Secondary | ICD-10-CM | POA: Diagnosis not present

## 2017-05-23 DIAGNOSIS — R569 Unspecified convulsions: Secondary | ICD-10-CM | POA: Diagnosis not present

## 2017-07-05 DIAGNOSIS — R2689 Other abnormalities of gait and mobility: Secondary | ICD-10-CM | POA: Diagnosis not present

## 2017-07-05 DIAGNOSIS — G2581 Restless legs syndrome: Secondary | ICD-10-CM | POA: Diagnosis not present

## 2017-07-05 DIAGNOSIS — M5412 Radiculopathy, cervical region: Secondary | ICD-10-CM | POA: Diagnosis not present

## 2017-07-05 DIAGNOSIS — G5603 Carpal tunnel syndrome, bilateral upper limbs: Secondary | ICD-10-CM | POA: Diagnosis not present

## 2017-07-05 DIAGNOSIS — G35 Multiple sclerosis: Secondary | ICD-10-CM | POA: Diagnosis not present

## 2017-07-05 DIAGNOSIS — R5381 Other malaise: Secondary | ICD-10-CM | POA: Diagnosis not present

## 2017-07-05 DIAGNOSIS — M5417 Radiculopathy, lumbosacral region: Secondary | ICD-10-CM | POA: Diagnosis not present

## 2017-08-17 DIAGNOSIS — M542 Cervicalgia: Secondary | ICD-10-CM | POA: Diagnosis not present

## 2017-08-17 DIAGNOSIS — G2581 Restless legs syndrome: Secondary | ICD-10-CM | POA: Diagnosis not present

## 2017-08-17 DIAGNOSIS — G5603 Carpal tunnel syndrome, bilateral upper limbs: Secondary | ICD-10-CM | POA: Diagnosis not present

## 2017-08-17 DIAGNOSIS — M5417 Radiculopathy, lumbosacral region: Secondary | ICD-10-CM | POA: Diagnosis not present

## 2017-08-17 DIAGNOSIS — R270 Ataxia, unspecified: Secondary | ICD-10-CM | POA: Diagnosis not present

## 2017-08-23 DIAGNOSIS — M47816 Spondylosis without myelopathy or radiculopathy, lumbar region: Secondary | ICD-10-CM | POA: Diagnosis not present

## 2017-08-23 DIAGNOSIS — I739 Peripheral vascular disease, unspecified: Secondary | ICD-10-CM | POA: Diagnosis not present

## 2017-08-23 DIAGNOSIS — M545 Low back pain: Secondary | ICD-10-CM | POA: Diagnosis not present

## 2017-08-23 DIAGNOSIS — M5136 Other intervertebral disc degeneration, lumbar region: Secondary | ICD-10-CM | POA: Diagnosis not present

## 2017-08-29 DIAGNOSIS — N3941 Urge incontinence: Secondary | ICD-10-CM | POA: Diagnosis not present

## 2017-09-05 DIAGNOSIS — M797 Fibromyalgia: Secondary | ICD-10-CM | POA: Diagnosis not present

## 2017-09-05 DIAGNOSIS — Z7983 Long term (current) use of bisphosphonates: Secondary | ICD-10-CM | POA: Diagnosis not present

## 2017-09-05 DIAGNOSIS — G894 Chronic pain syndrome: Secondary | ICD-10-CM | POA: Diagnosis not present

## 2017-09-05 DIAGNOSIS — F17200 Nicotine dependence, unspecified, uncomplicated: Secondary | ICD-10-CM | POA: Diagnosis not present

## 2017-09-05 DIAGNOSIS — M47816 Spondylosis without myelopathy or radiculopathy, lumbar region: Secondary | ICD-10-CM | POA: Diagnosis not present

## 2017-09-05 DIAGNOSIS — Z79899 Other long term (current) drug therapy: Secondary | ICD-10-CM | POA: Diagnosis not present

## 2017-09-05 DIAGNOSIS — Z91013 Allergy to seafood: Secondary | ICD-10-CM | POA: Diagnosis not present

## 2017-09-05 DIAGNOSIS — Z888 Allergy status to other drugs, medicaments and biological substances status: Secondary | ICD-10-CM | POA: Diagnosis not present

## 2017-10-03 DIAGNOSIS — Z91013 Allergy to seafood: Secondary | ICD-10-CM | POA: Diagnosis not present

## 2017-10-03 DIAGNOSIS — Z885 Allergy status to narcotic agent status: Secondary | ICD-10-CM | POA: Diagnosis not present

## 2017-10-03 DIAGNOSIS — M461 Sacroiliitis, not elsewhere classified: Secondary | ICD-10-CM | POA: Diagnosis not present

## 2017-10-03 DIAGNOSIS — G894 Chronic pain syndrome: Secondary | ICD-10-CM | POA: Diagnosis not present

## 2017-10-03 DIAGNOSIS — Z888 Allergy status to other drugs, medicaments and biological substances status: Secondary | ICD-10-CM | POA: Diagnosis not present

## 2017-10-03 DIAGNOSIS — Z79899 Other long term (current) drug therapy: Secondary | ICD-10-CM | POA: Diagnosis not present

## 2017-10-03 DIAGNOSIS — G35 Multiple sclerosis: Secondary | ICD-10-CM | POA: Diagnosis not present

## 2017-10-03 DIAGNOSIS — M5136 Other intervertebral disc degeneration, lumbar region: Secondary | ICD-10-CM | POA: Diagnosis not present

## 2017-10-03 DIAGNOSIS — M797 Fibromyalgia: Secondary | ICD-10-CM | POA: Diagnosis not present

## 2017-10-03 DIAGNOSIS — M47816 Spondylosis without myelopathy or radiculopathy, lumbar region: Secondary | ICD-10-CM | POA: Diagnosis not present

## 2017-10-04 DIAGNOSIS — G894 Chronic pain syndrome: Secondary | ICD-10-CM | POA: Diagnosis not present

## 2017-10-04 DIAGNOSIS — M545 Low back pain: Secondary | ICD-10-CM | POA: Diagnosis not present

## 2017-10-04 DIAGNOSIS — M47816 Spondylosis without myelopathy or radiculopathy, lumbar region: Secondary | ICD-10-CM | POA: Diagnosis not present

## 2017-10-25 DIAGNOSIS — R1032 Left lower quadrant pain: Secondary | ICD-10-CM | POA: Diagnosis not present

## 2017-10-25 DIAGNOSIS — Z8719 Personal history of other diseases of the digestive system: Secondary | ICD-10-CM | POA: Diagnosis not present

## 2017-10-25 DIAGNOSIS — Z23 Encounter for immunization: Secondary | ICD-10-CM | POA: Diagnosis not present

## 2017-10-25 DIAGNOSIS — Z791 Long term (current) use of non-steroidal anti-inflammatories (NSAID): Secondary | ICD-10-CM | POA: Diagnosis not present

## 2017-11-15 DIAGNOSIS — G2581 Restless legs syndrome: Secondary | ICD-10-CM | POA: Diagnosis not present

## 2017-11-15 DIAGNOSIS — R270 Ataxia, unspecified: Secondary | ICD-10-CM | POA: Diagnosis not present

## 2017-11-15 DIAGNOSIS — M542 Cervicalgia: Secondary | ICD-10-CM | POA: Diagnosis not present

## 2017-11-15 DIAGNOSIS — M5417 Radiculopathy, lumbosacral region: Secondary | ICD-10-CM | POA: Diagnosis not present

## 2017-11-15 DIAGNOSIS — G5603 Carpal tunnel syndrome, bilateral upper limbs: Secondary | ICD-10-CM | POA: Diagnosis not present

## 2018-01-29 DIAGNOSIS — M791 Myalgia, unspecified site: Secondary | ICD-10-CM | POA: Diagnosis not present

## 2018-01-29 DIAGNOSIS — M79642 Pain in left hand: Secondary | ICD-10-CM | POA: Diagnosis not present

## 2018-01-29 DIAGNOSIS — M19041 Primary osteoarthritis, right hand: Secondary | ICD-10-CM | POA: Diagnosis not present

## 2018-01-29 DIAGNOSIS — M48061 Spinal stenosis, lumbar region without neurogenic claudication: Secondary | ICD-10-CM | POA: Diagnosis not present

## 2018-01-29 DIAGNOSIS — M4802 Spinal stenosis, cervical region: Secondary | ICD-10-CM | POA: Diagnosis not present

## 2018-01-29 DIAGNOSIS — M19071 Primary osteoarthritis, right ankle and foot: Secondary | ICD-10-CM | POA: Diagnosis not present

## 2018-01-29 DIAGNOSIS — M79641 Pain in right hand: Secondary | ICD-10-CM | POA: Diagnosis not present

## 2018-01-29 DIAGNOSIS — M47814 Spondylosis without myelopathy or radiculopathy, thoracic region: Secondary | ICD-10-CM | POA: Diagnosis not present

## 2018-01-29 DIAGNOSIS — M79671 Pain in right foot: Secondary | ICD-10-CM | POA: Diagnosis not present

## 2018-01-29 DIAGNOSIS — M2550 Pain in unspecified joint: Secondary | ICD-10-CM | POA: Diagnosis not present

## 2018-01-29 DIAGNOSIS — M19042 Primary osteoarthritis, left hand: Secondary | ICD-10-CM | POA: Diagnosis not present

## 2018-01-29 DIAGNOSIS — M533 Sacrococcygeal disorders, not elsewhere classified: Secondary | ICD-10-CM | POA: Diagnosis not present

## 2018-01-29 DIAGNOSIS — Z1589 Genetic susceptibility to other disease: Secondary | ICD-10-CM | POA: Diagnosis not present

## 2018-01-29 DIAGNOSIS — M19072 Primary osteoarthritis, left ankle and foot: Secondary | ICD-10-CM | POA: Diagnosis not present

## 2018-01-29 DIAGNOSIS — M549 Dorsalgia, unspecified: Secondary | ICD-10-CM | POA: Diagnosis not present

## 2018-01-30 DIAGNOSIS — M542 Cervicalgia: Secondary | ICD-10-CM | POA: Diagnosis not present

## 2018-01-30 DIAGNOSIS — M5417 Radiculopathy, lumbosacral region: Secondary | ICD-10-CM | POA: Diagnosis not present

## 2018-01-30 DIAGNOSIS — R270 Ataxia, unspecified: Secondary | ICD-10-CM | POA: Diagnosis not present

## 2018-01-30 DIAGNOSIS — R5381 Other malaise: Secondary | ICD-10-CM | POA: Diagnosis not present

## 2018-01-30 DIAGNOSIS — M47814 Spondylosis without myelopathy or radiculopathy, thoracic region: Secondary | ICD-10-CM | POA: Diagnosis not present

## 2018-01-30 DIAGNOSIS — M533 Sacrococcygeal disorders, not elsewhere classified: Secondary | ICD-10-CM | POA: Diagnosis not present

## 2018-01-30 DIAGNOSIS — G2581 Restless legs syndrome: Secondary | ICD-10-CM | POA: Diagnosis not present

## 2018-02-19 DIAGNOSIS — M791 Myalgia, unspecified site: Secondary | ICD-10-CM | POA: Diagnosis not present

## 2018-02-19 DIAGNOSIS — M549 Dorsalgia, unspecified: Secondary | ICD-10-CM | POA: Diagnosis not present

## 2018-02-19 DIAGNOSIS — Z1589 Genetic susceptibility to other disease: Secondary | ICD-10-CM | POA: Diagnosis not present

## 2018-02-19 DIAGNOSIS — M2550 Pain in unspecified joint: Secondary | ICD-10-CM | POA: Diagnosis not present

## 2018-02-22 DIAGNOSIS — Z1231 Encounter for screening mammogram for malignant neoplasm of breast: Secondary | ICD-10-CM | POA: Diagnosis not present

## 2018-02-26 DIAGNOSIS — I10 Essential (primary) hypertension: Secondary | ICD-10-CM | POA: Diagnosis not present

## 2018-02-26 DIAGNOSIS — Z0000 Encounter for general adult medical examination without abnormal findings: Secondary | ICD-10-CM | POA: Diagnosis not present

## 2018-02-26 DIAGNOSIS — E039 Hypothyroidism, unspecified: Secondary | ICD-10-CM | POA: Diagnosis not present

## 2018-02-26 DIAGNOSIS — E559 Vitamin D deficiency, unspecified: Secondary | ICD-10-CM | POA: Diagnosis not present

## 2018-03-01 ENCOUNTER — Other Ambulatory Visit: Payer: Self-pay | Admitting: Rheumatology

## 2018-03-01 DIAGNOSIS — M5489 Other dorsalgia: Secondary | ICD-10-CM

## 2018-03-01 DIAGNOSIS — M533 Sacrococcygeal disorders, not elsewhere classified: Secondary | ICD-10-CM

## 2018-04-10 DIAGNOSIS — R569 Unspecified convulsions: Secondary | ICD-10-CM | POA: Diagnosis not present

## 2018-04-10 DIAGNOSIS — M791 Myalgia, unspecified site: Secondary | ICD-10-CM | POA: Diagnosis not present

## 2018-04-10 DIAGNOSIS — M5417 Radiculopathy, lumbosacral region: Secondary | ICD-10-CM | POA: Diagnosis not present

## 2018-04-10 DIAGNOSIS — Z79899 Other long term (current) drug therapy: Secondary | ICD-10-CM | POA: Diagnosis not present

## 2018-04-10 DIAGNOSIS — G35 Multiple sclerosis: Secondary | ICD-10-CM | POA: Diagnosis not present

## 2018-06-12 DIAGNOSIS — M5417 Radiculopathy, lumbosacral region: Secondary | ICD-10-CM | POA: Diagnosis not present

## 2018-06-12 DIAGNOSIS — G2581 Restless legs syndrome: Secondary | ICD-10-CM | POA: Diagnosis not present

## 2018-06-12 DIAGNOSIS — G35 Multiple sclerosis: Secondary | ICD-10-CM | POA: Diagnosis not present

## 2018-06-12 DIAGNOSIS — R569 Unspecified convulsions: Secondary | ICD-10-CM | POA: Diagnosis not present

## 2018-06-12 DIAGNOSIS — R5381 Other malaise: Secondary | ICD-10-CM | POA: Diagnosis not present

## 2018-07-10 DIAGNOSIS — R569 Unspecified convulsions: Secondary | ICD-10-CM | POA: Diagnosis not present

## 2018-07-10 DIAGNOSIS — G5603 Carpal tunnel syndrome, bilateral upper limbs: Secondary | ICD-10-CM | POA: Diagnosis not present

## 2018-07-10 DIAGNOSIS — G2581 Restless legs syndrome: Secondary | ICD-10-CM | POA: Diagnosis not present

## 2018-07-10 DIAGNOSIS — G35 Multiple sclerosis: Secondary | ICD-10-CM | POA: Diagnosis not present

## 2018-07-10 DIAGNOSIS — M5417 Radiculopathy, lumbosacral region: Secondary | ICD-10-CM | POA: Diagnosis not present

## 2018-07-10 DIAGNOSIS — M5412 Radiculopathy, cervical region: Secondary | ICD-10-CM | POA: Diagnosis not present

## 2018-08-07 DIAGNOSIS — M542 Cervicalgia: Secondary | ICD-10-CM | POA: Diagnosis not present

## 2018-08-07 DIAGNOSIS — R5381 Other malaise: Secondary | ICD-10-CM | POA: Diagnosis not present

## 2018-08-07 DIAGNOSIS — G4700 Insomnia, unspecified: Secondary | ICD-10-CM | POA: Diagnosis not present

## 2018-08-07 DIAGNOSIS — G2581 Restless legs syndrome: Secondary | ICD-10-CM | POA: Diagnosis not present

## 2018-08-07 DIAGNOSIS — G35 Multiple sclerosis: Secondary | ICD-10-CM | POA: Diagnosis not present

## 2018-10-16 DIAGNOSIS — R569 Unspecified convulsions: Secondary | ICD-10-CM | POA: Diagnosis not present

## 2018-10-16 DIAGNOSIS — R5383 Other fatigue: Secondary | ICD-10-CM | POA: Diagnosis not present

## 2018-10-16 DIAGNOSIS — G35 Multiple sclerosis: Secondary | ICD-10-CM | POA: Diagnosis not present

## 2018-10-16 DIAGNOSIS — G2581 Restless legs syndrome: Secondary | ICD-10-CM | POA: Diagnosis not present

## 2018-10-16 DIAGNOSIS — M5417 Radiculopathy, lumbosacral region: Secondary | ICD-10-CM | POA: Diagnosis not present

## 2018-12-23 DIAGNOSIS — Z20828 Contact with and (suspected) exposure to other viral communicable diseases: Secondary | ICD-10-CM | POA: Diagnosis not present

## 2018-12-23 DIAGNOSIS — Z03818 Encounter for observation for suspected exposure to other biological agents ruled out: Secondary | ICD-10-CM | POA: Diagnosis not present

## 2019-02-25 DIAGNOSIS — R569 Unspecified convulsions: Secondary | ICD-10-CM | POA: Diagnosis not present

## 2019-02-25 DIAGNOSIS — G2581 Restless legs syndrome: Secondary | ICD-10-CM | POA: Diagnosis not present

## 2019-02-25 DIAGNOSIS — R5383 Other fatigue: Secondary | ICD-10-CM | POA: Diagnosis not present

## 2019-02-25 DIAGNOSIS — M5417 Radiculopathy, lumbosacral region: Secondary | ICD-10-CM | POA: Diagnosis not present

## 2019-02-25 DIAGNOSIS — G35 Multiple sclerosis: Secondary | ICD-10-CM | POA: Diagnosis not present

## 2019-04-02 DIAGNOSIS — R569 Unspecified convulsions: Secondary | ICD-10-CM | POA: Diagnosis not present

## 2019-04-02 DIAGNOSIS — G5603 Carpal tunnel syndrome, bilateral upper limbs: Secondary | ICD-10-CM | POA: Diagnosis not present

## 2019-04-02 DIAGNOSIS — R202 Paresthesia of skin: Secondary | ICD-10-CM | POA: Diagnosis not present

## 2019-04-02 DIAGNOSIS — G35 Multiple sclerosis: Secondary | ICD-10-CM | POA: Diagnosis not present

## 2019-04-02 DIAGNOSIS — G2581 Restless legs syndrome: Secondary | ICD-10-CM | POA: Diagnosis not present

## 2019-04-02 DIAGNOSIS — M5412 Radiculopathy, cervical region: Secondary | ICD-10-CM | POA: Diagnosis not present

## 2019-04-02 DIAGNOSIS — M5417 Radiculopathy, lumbosacral region: Secondary | ICD-10-CM | POA: Diagnosis not present

## 2019-04-02 DIAGNOSIS — G603 Idiopathic progressive neuropathy: Secondary | ICD-10-CM | POA: Diagnosis not present

## 2019-05-14 DIAGNOSIS — M79671 Pain in right foot: Secondary | ICD-10-CM | POA: Diagnosis not present

## 2019-05-14 DIAGNOSIS — G35 Multiple sclerosis: Secondary | ICD-10-CM | POA: Diagnosis not present

## 2019-05-14 DIAGNOSIS — M79672 Pain in left foot: Secondary | ICD-10-CM | POA: Diagnosis not present

## 2019-05-14 DIAGNOSIS — M5417 Radiculopathy, lumbosacral region: Secondary | ICD-10-CM | POA: Diagnosis not present

## 2019-05-14 DIAGNOSIS — G2581 Restless legs syndrome: Secondary | ICD-10-CM | POA: Diagnosis not present

## 2019-08-06 DIAGNOSIS — Z9989 Dependence on other enabling machines and devices: Secondary | ICD-10-CM | POA: Diagnosis not present

## 2019-08-06 DIAGNOSIS — R569 Unspecified convulsions: Secondary | ICD-10-CM | POA: Diagnosis not present

## 2019-08-06 DIAGNOSIS — G2581 Restless legs syndrome: Secondary | ICD-10-CM | POA: Diagnosis not present

## 2019-08-06 DIAGNOSIS — G35 Multiple sclerosis: Secondary | ICD-10-CM | POA: Diagnosis not present

## 2019-08-06 DIAGNOSIS — M5417 Radiculopathy, lumbosacral region: Secondary | ICD-10-CM | POA: Diagnosis not present

## 2019-10-15 DIAGNOSIS — M5417 Radiculopathy, lumbosacral region: Secondary | ICD-10-CM | POA: Diagnosis not present

## 2019-10-15 DIAGNOSIS — R5383 Other fatigue: Secondary | ICD-10-CM | POA: Diagnosis not present

## 2019-10-15 DIAGNOSIS — G2581 Restless legs syndrome: Secondary | ICD-10-CM | POA: Diagnosis not present

## 2019-10-15 DIAGNOSIS — G35 Multiple sclerosis: Secondary | ICD-10-CM | POA: Diagnosis not present

## 2019-10-15 DIAGNOSIS — R569 Unspecified convulsions: Secondary | ICD-10-CM | POA: Diagnosis not present

## 2019-11-06 DIAGNOSIS — R296 Repeated falls: Secondary | ICD-10-CM | POA: Diagnosis not present

## 2019-11-06 DIAGNOSIS — R5383 Other fatigue: Secondary | ICD-10-CM | POA: Diagnosis not present

## 2019-11-06 DIAGNOSIS — M5412 Radiculopathy, cervical region: Secondary | ICD-10-CM | POA: Diagnosis not present

## 2019-11-06 DIAGNOSIS — G5603 Carpal tunnel syndrome, bilateral upper limbs: Secondary | ICD-10-CM | POA: Diagnosis not present

## 2019-11-06 DIAGNOSIS — G603 Idiopathic progressive neuropathy: Secondary | ICD-10-CM | POA: Diagnosis not present

## 2019-11-06 DIAGNOSIS — M5417 Radiculopathy, lumbosacral region: Secondary | ICD-10-CM | POA: Diagnosis not present

## 2019-11-06 DIAGNOSIS — G35 Multiple sclerosis: Secondary | ICD-10-CM | POA: Diagnosis not present

## 2019-11-06 DIAGNOSIS — R531 Weakness: Secondary | ICD-10-CM | POA: Diagnosis not present

## 2019-12-10 DIAGNOSIS — G35 Multiple sclerosis: Secondary | ICD-10-CM | POA: Diagnosis not present

## 2019-12-10 DIAGNOSIS — R2689 Other abnormalities of gait and mobility: Secondary | ICD-10-CM | POA: Diagnosis not present

## 2019-12-10 DIAGNOSIS — R202 Paresthesia of skin: Secondary | ICD-10-CM | POA: Diagnosis not present

## 2019-12-10 DIAGNOSIS — G2581 Restless legs syndrome: Secondary | ICD-10-CM | POA: Diagnosis not present

## 2019-12-10 DIAGNOSIS — M545 Low back pain, unspecified: Secondary | ICD-10-CM | POA: Diagnosis not present

## 2019-12-29 DIAGNOSIS — Z03818 Encounter for observation for suspected exposure to other biological agents ruled out: Secondary | ICD-10-CM | POA: Diagnosis not present

## 2020-05-05 DIAGNOSIS — M459 Ankylosing spondylitis of unspecified sites in spine: Secondary | ICD-10-CM | POA: Diagnosis not present

## 2020-05-05 DIAGNOSIS — R600 Localized edema: Secondary | ICD-10-CM | POA: Diagnosis not present

## 2020-05-05 DIAGNOSIS — I10 Essential (primary) hypertension: Secondary | ICD-10-CM | POA: Diagnosis not present

## 2020-05-05 DIAGNOSIS — G35 Multiple sclerosis: Secondary | ICD-10-CM | POA: Diagnosis not present

## 2020-05-27 DIAGNOSIS — G35 Multiple sclerosis: Secondary | ICD-10-CM | POA: Diagnosis not present

## 2020-05-27 DIAGNOSIS — M545 Low back pain, unspecified: Secondary | ICD-10-CM | POA: Diagnosis not present

## 2020-05-27 DIAGNOSIS — R5383 Other fatigue: Secondary | ICD-10-CM | POA: Diagnosis not present

## 2020-05-27 DIAGNOSIS — G4700 Insomnia, unspecified: Secondary | ICD-10-CM | POA: Diagnosis not present

## 2020-05-27 DIAGNOSIS — G2581 Restless legs syndrome: Secondary | ICD-10-CM | POA: Diagnosis not present

## 2020-07-29 DIAGNOSIS — G35 Multiple sclerosis: Secondary | ICD-10-CM | POA: Diagnosis not present

## 2020-07-29 DIAGNOSIS — R202 Paresthesia of skin: Secondary | ICD-10-CM | POA: Diagnosis not present

## 2020-07-29 DIAGNOSIS — R569 Unspecified convulsions: Secondary | ICD-10-CM | POA: Diagnosis not present

## 2020-07-29 DIAGNOSIS — Z79899 Other long term (current) drug therapy: Secondary | ICD-10-CM | POA: Diagnosis not present

## 2020-07-29 DIAGNOSIS — M545 Low back pain, unspecified: Secondary | ICD-10-CM | POA: Diagnosis not present

## 2020-07-29 DIAGNOSIS — M542 Cervicalgia: Secondary | ICD-10-CM | POA: Diagnosis not present

## 2020-07-29 DIAGNOSIS — G2581 Restless legs syndrome: Secondary | ICD-10-CM | POA: Diagnosis not present

## 2020-08-27 DIAGNOSIS — R600 Localized edema: Secondary | ICD-10-CM | POA: Diagnosis not present

## 2020-08-27 DIAGNOSIS — Z13220 Encounter for screening for lipoid disorders: Secondary | ICD-10-CM | POA: Diagnosis not present

## 2020-08-27 DIAGNOSIS — R7989 Other specified abnormal findings of blood chemistry: Secondary | ICD-10-CM | POA: Diagnosis not present

## 2020-08-27 DIAGNOSIS — R7309 Other abnormal glucose: Secondary | ICD-10-CM | POA: Diagnosis not present

## 2020-09-02 DIAGNOSIS — G35 Multiple sclerosis: Secondary | ICD-10-CM | POA: Diagnosis not present

## 2020-09-02 DIAGNOSIS — R5383 Other fatigue: Secondary | ICD-10-CM | POA: Diagnosis not present

## 2020-09-02 DIAGNOSIS — Z79899 Other long term (current) drug therapy: Secondary | ICD-10-CM | POA: Diagnosis not present

## 2020-09-02 DIAGNOSIS — G5603 Carpal tunnel syndrome, bilateral upper limbs: Secondary | ICD-10-CM | POA: Diagnosis not present

## 2020-09-02 DIAGNOSIS — M5417 Radiculopathy, lumbosacral region: Secondary | ICD-10-CM | POA: Diagnosis not present

## 2020-09-02 DIAGNOSIS — G603 Idiopathic progressive neuropathy: Secondary | ICD-10-CM | POA: Diagnosis not present

## 2020-09-02 DIAGNOSIS — M5412 Radiculopathy, cervical region: Secondary | ICD-10-CM | POA: Diagnosis not present

## 2020-09-02 DIAGNOSIS — R202 Paresthesia of skin: Secondary | ICD-10-CM | POA: Diagnosis not present

## 2020-09-02 DIAGNOSIS — R7989 Other specified abnormal findings of blood chemistry: Secondary | ICD-10-CM | POA: Diagnosis not present

## 2020-09-02 DIAGNOSIS — R270 Ataxia, unspecified: Secondary | ICD-10-CM | POA: Diagnosis not present

## 2020-09-02 DIAGNOSIS — G2581 Restless legs syndrome: Secondary | ICD-10-CM | POA: Diagnosis not present

## 2020-09-02 DIAGNOSIS — Z1389 Encounter for screening for other disorder: Secondary | ICD-10-CM | POA: Diagnosis not present

## 2020-09-03 DIAGNOSIS — Z0000 Encounter for general adult medical examination without abnormal findings: Secondary | ICD-10-CM | POA: Diagnosis not present

## 2020-09-03 DIAGNOSIS — M810 Age-related osteoporosis without current pathological fracture: Secondary | ICD-10-CM | POA: Diagnosis not present

## 2020-09-03 DIAGNOSIS — I10 Essential (primary) hypertension: Secondary | ICD-10-CM | POA: Diagnosis not present

## 2020-09-03 DIAGNOSIS — Z780 Asymptomatic menopausal state: Secondary | ICD-10-CM | POA: Diagnosis not present

## 2020-09-03 DIAGNOSIS — G35 Multiple sclerosis: Secondary | ICD-10-CM | POA: Diagnosis not present

## 2020-09-03 DIAGNOSIS — Z23 Encounter for immunization: Secondary | ICD-10-CM | POA: Diagnosis not present

## 2020-09-18 ENCOUNTER — Other Ambulatory Visit: Payer: Self-pay | Admitting: Specialist

## 2020-09-18 ENCOUNTER — Ambulatory Visit
Admission: RE | Admit: 2020-09-18 | Discharge: 2020-09-18 | Disposition: A | Discharge status: Home / Self Care | Payer: BC Managed Care – PPO | Source: Ambulatory Visit | Attending: Specialist | Admitting: Specialist

## 2020-09-18 DIAGNOSIS — G35 Multiple sclerosis: Secondary | ICD-10-CM

## 2020-09-18 DIAGNOSIS — I700 Atherosclerosis of aorta: Secondary | ICD-10-CM | POA: Diagnosis not present

## 2020-09-21 ENCOUNTER — Other Ambulatory Visit: Payer: Self-pay | Admitting: Specialist

## 2020-09-21 DIAGNOSIS — R9389 Abnormal findings on diagnostic imaging of other specified body structures: Secondary | ICD-10-CM

## 2020-09-22 DIAGNOSIS — R569 Unspecified convulsions: Secondary | ICD-10-CM | POA: Diagnosis not present

## 2020-09-22 DIAGNOSIS — M5440 Lumbago with sciatica, unspecified side: Secondary | ICD-10-CM | POA: Diagnosis not present

## 2020-09-22 DIAGNOSIS — G603 Idiopathic progressive neuropathy: Secondary | ICD-10-CM | POA: Diagnosis not present

## 2020-09-22 DIAGNOSIS — G2581 Restless legs syndrome: Secondary | ICD-10-CM | POA: Diagnosis not present

## 2020-09-22 DIAGNOSIS — G35 Multiple sclerosis: Secondary | ICD-10-CM | POA: Diagnosis not present

## 2020-10-09 ENCOUNTER — Ambulatory Visit
Admission: RE | Admit: 2020-10-09 | Discharge: 2020-10-09 | Disposition: A | Discharge status: Home / Self Care | Payer: BC Managed Care – PPO | Source: Ambulatory Visit | Attending: Specialist | Admitting: Specialist

## 2020-10-09 ENCOUNTER — Other Ambulatory Visit: Payer: Self-pay

## 2020-10-09 DIAGNOSIS — I2510 Atherosclerotic heart disease of native coronary artery without angina pectoris: Secondary | ICD-10-CM | POA: Diagnosis not present

## 2020-10-09 DIAGNOSIS — N6322 Unspecified lump in the left breast, upper inner quadrant: Secondary | ICD-10-CM | POA: Diagnosis not present

## 2020-10-09 DIAGNOSIS — R9389 Abnormal findings on diagnostic imaging of other specified body structures: Secondary | ICD-10-CM

## 2020-10-09 DIAGNOSIS — J432 Centrilobular emphysema: Secondary | ICD-10-CM | POA: Diagnosis not present

## 2020-10-09 DIAGNOSIS — I700 Atherosclerosis of aorta: Secondary | ICD-10-CM | POA: Diagnosis not present

## 2020-10-09 MED ORDER — IOPAMIDOL (ISOVUE-300) INJECTION 61%
75.0000 mL | Freq: Once | INTRAVENOUS | Status: AC | PRN
Start: 2020-10-09 — End: 2020-10-09
  Administered 2020-10-09: 75 mL via INTRAVENOUS

## 2020-10-13 DIAGNOSIS — M810 Age-related osteoporosis without current pathological fracture: Secondary | ICD-10-CM | POA: Diagnosis not present

## 2020-10-13 DIAGNOSIS — Z23 Encounter for immunization: Secondary | ICD-10-CM | POA: Diagnosis not present

## 2020-10-30 DIAGNOSIS — N281 Cyst of kidney, acquired: Secondary | ICD-10-CM | POA: Diagnosis not present

## 2020-10-30 DIAGNOSIS — E041 Nontoxic single thyroid nodule: Secondary | ICD-10-CM | POA: Diagnosis not present

## 2020-10-30 DIAGNOSIS — R918 Other nonspecific abnormal finding of lung field: Secondary | ICD-10-CM | POA: Diagnosis not present

## 2020-10-30 DIAGNOSIS — J3489 Other specified disorders of nose and nasal sinuses: Secondary | ICD-10-CM | POA: Diagnosis not present

## 2020-10-30 DIAGNOSIS — Z1231 Encounter for screening mammogram for malignant neoplasm of breast: Secondary | ICD-10-CM | POA: Diagnosis not present

## 2020-10-30 DIAGNOSIS — E042 Nontoxic multinodular goiter: Secondary | ICD-10-CM | POA: Diagnosis not present

## 2020-11-02 ENCOUNTER — Encounter: Payer: Self-pay | Admitting: Internal Medicine

## 2020-11-02 ENCOUNTER — Ambulatory Visit: Payer: BC Managed Care – PPO | Admitting: Internal Medicine

## 2020-11-02 ENCOUNTER — Other Ambulatory Visit: Payer: Self-pay

## 2020-11-02 DIAGNOSIS — J449 Chronic obstructive pulmonary disease, unspecified: Secondary | ICD-10-CM | POA: Diagnosis not present

## 2020-11-02 DIAGNOSIS — R918 Other nonspecific abnormal finding of lung field: Secondary | ICD-10-CM

## 2020-11-02 NOTE — Assessment & Plan Note (Signed)
Active smoker with features of CB clinically and Moderate to severe centrilobular emphysema on CT 10/09/20    At this point more limited by weakness than sob but impressive CB and ? Evolving bronchiectatic features or involvement with atypical tb  (see MPN a/p)  Most important aspect of here care at this point is stop smoking and, once TB excluded, return of PFTs

## 2020-11-02 NOTE — Progress Notes (Signed)
[name], female    DOB: Apr 22, 1960,    MRN: [id number]   Brief patient profile:  1 yowf active smoker  referred to pulmonary clinic 11/02/2020 by Dr Trula Ore  for L > R cavitary nodules done for the purpose of offering biologics for MS dx 2015       History of Present Illness  11/02/2020  Pulmonary/ 1st office eval/[facility]  Chief Complaint  Patient presents with   Consult    Patient was diagnosed with emphysema   Dyspnea:  more limited by strength than breathing  Cough: not usually but thinks getting over a cold with increased  nasal congestion x 2 weeks with increase mucoid secretions only, no hemoptysis  Sleep: no resp issues  SABA use: none  No know exp to TB, no NS, unintended wt loss   No obvious day to day or daytime variability or assoc  purulent sputum or mucus plugs or hemoptysis or cp or chest tightness, subjective wheeze or overt sinus or hb symptoms.   Sleeping  without nocturnal  or early am exacerbation  of respiratory  c/o's or need for noct saba. Also denies any obvious fluctuation of symptoms with weather or environmental changes or other aggravating or alleviating factors except as outlined above   No unusual exposure hx or h/o childhood pna/ asthma or knowledge of premature birth.  Current Allergies, Complete Past Medical History, Past Surgical History, Family History, and Social History were reviewed in [employer] record.  ROS  The following are not active complaints unless bolded Hoarseness, sore throat, dysphagia, dental problems, itching, sneezing,  nasal congestion or discharge of excess mucus or purulent secretions, ear ache,   fever, chills, sweats, unintended wt loss or wt gain, classically pleuritic or exertional cp,  orthopnea pnd or arm/hand swelling  or leg swelling, presyncope, palpitations, abdominal pain, anorexia, nausea, vomiting, diarrhea  or change in bowel habits or change in bladder habits, change in stools or change  in urine, dysuria, hematuria,  rash, arthralgias, visual complaints, headache, numbness, weakness or ataxia or problems with walking or coordination,  change in mood or  memory.           Past Medical History:  Diagnosis Date   Allergy    seasonal   Anemia    GERD (gastroesophageal reflux disease)    HLA B27 (HLA B27 positive)    Hx: UTI (urinary tract infection)    Hyperplastic colon polyp    Hypertension    IBS (irritable bowel syndrome)    Seizures ([place])    hx epilepsy-last seizure 9/14 after taking tramadol    Outpatient Medications Prior to Visit  Medication Sig Dispense Refill   Cholecalciferol (VITAMIN D) 2000 UNITS CAPS Take 1 capsule by mouth daily.     Dimethyl Fumarate 240 MG CPDR Take 1 capsule by mouth every evening.     glycopyrrolate (ROBINUL) 1 MG tablet Take 1 mg by mouth 3 (three) times daily.     ibuprofen (ADVIL,MOTRIN) 200 MG tablet Take 200 mg by mouth every 6 (six) hours as needed for moderate pain.     L-Methylfolate-B6-B12 (METANX) 3-35-2 MG TABS Take 1 tablet by mouth daily. One tablet by mouth twice daily     levETIRAcetam (KEPPRA) 500 MG tablet Take 1 tablet (500 mg total) by mouth 2 (two) times daily. 60 tablet 12   losartan (COZAAR) 25 MG tablet Take 25 mg by mouth every evening.      methylphenidate (RITALIN) 10 MG  tablet Take 10 mg by mouth 2 (two) times daily.     naphazoline-pheniramine (NAPHCON-A) 0.025-0.3 % ophthalmic solution Place 1 drop into both eyes daily as needed for irritation.     vitamin B-12 (CYANOCOBALAMIN) 1000 MCG tablet Take 1,000 mcg by mouth daily.       acetaminophen (TYLENOL) 500 MG tablet Take 500 mg by mouth 2 (two) times daily. (Patient not taking: Reported on 11/02/2020)     No facility-administered medications prior to visit.     Objective:     BP 124/78 (BP Location: Left Arm, Cuff Size: Normal)   Pulse 92   Temp 97.8 F (36.6 C)   Ht 5' 3.5" (1.613 m)   Wt 143 lb (64.9 kg)   SpO2 95% Comment: RA  BMI 24.93  kg/m   SpO2: 95 % (RA)  Amb wm nad, very congested sounding cough  HEENT : pt wearing mask not removed for exam due to covid -19 concerns.    NECK :  without JVD/Nodes/TM/ nl carotid upstrokes bilaterally   LUNGS: no acc muscle use,  Mod barrel  contour chest wall with bilateral  Distant bs s audible wheeze and  without cough on insp or exp maneuvers and mod  Hyperresonant  to  percussion bilaterally     CV:  RRR  no s3 or murmur or increase in P2, and no edema   ABD:  soft and nontender with pos mid insp Hoover's  in the supine position. No bruits or organomegaly appreciated, bowel sounds nl  MS:     ext warm without deformities, calf tenderness, cyanosis or clubbing No obvious joint restrictions   SKIN: warm and dry without lesions    NEURO:  alert, approp, nl sensorium with  no motor or cerebellar deficits apparent.           Assessment   COPD  GOLD ? / still smoking  Active smoker with features of CB clinically and Moderate to severe centrilobular emphysema on CT 10/09/20    At this point more limited by weakness than sob but impressive CB and ? Evolving bronchiectatic features or involvement with atypical tb  (see MPN a/p)  Most important aspect of here care at this point is stop smoking and, once TB excluded, return of PFTs     Multiple pulmonary nodules determined by computed tomography of lung CT 10/09/20  There is a cavitary lesion of the LEFT upper lobe which measures up to 29 mm. There is an additional cavitary lesion in the RIGHT lower lobe which measures up to 15 mm. Findings may reflect tuberculosis given history of indeterminate TB test. However malignancy remains in the differential consideration, especially given underlying emphysema and presence of soft tissue nodule along the medial margin the dominant cavitary lesion which could reflect additional site of disease versus enlarged distal hilar lymph node. Recommend correlation with bronchoscopy.  PET-CT may be of assistance in delineating potential etiology given presence of indeterminate osseous lucent lesions. -  Labs 11/02/2020 req  Histo, crypto ag and repeat Gold TB / ESR and check sputum q am x 3 for afb stain and culture and if neg proceed with navigational bx LUL and careul surveillance of the smaller lesions.  Discussed in detail all the  indications, usual  risks and alternatives  relative to the benefits with patient who agrees to proceed with w/u as outlined.          Each maintenance medication was reviewed in detail including emphasizing most importantly  the difference between maintenance and prns and under what circumstances the prns are to be triggered using an action plan format where appropriate.  Total time for H and P, chart review, counseling,   and generating customized AVS unique to this office visit / same day charting =46 min         Christinia Gully, MD 11/02/2020

## 2020-11-02 NOTE — Assessment & Plan Note (Signed)
CT 10/09/20  There is a cavitary lesion of the LEFT upper lobe which measures up to 29 mm. There is an additional cavitary lesion in the RIGHT lower lobe which measures up to 15 mm. Findings may reflect tuberculosis given history of indeterminate TB test. However malignancy remains in the differential consideration, especially given underlying emphysema and presence of soft tissue nodule along the medial margin the dominant cavitary lesion which could reflect additional site of disease versus enlarged distal hilar lymph node. Recommend correlation with bronchoscopy. PET-CT may be of assistance in delineating potential etiology given presence of indeterminate osseous lucent lesions. -  Labs 11/02/2020 req  Histo, crypto ag and repeat Gold TB / ESR and check sputum q am x 3 for afb stain and culture and if neg proceed with navigational bx LUL and careul surveillance of the smaller lesions.  Discussed in detail all the  indications, usual  risks and alternatives  relative to the benefits with patient who agrees to proceed with w/u as outlined.            Each maintenance medication was reviewed in detail including emphasizing most importantly the difference between maintenance and prns and under what circumstances the prns are to be triggered using an action plan format where appropriate.  Total time for H and P, chart review, counseling,   and generating customized AVS unique to this office visit / same day charting =46 min       

## 2020-11-02 NOTE — Patient Instructions (Signed)
The key is to stop smoking completely before smoking completely stops you!  Please remember to go to the lab department   for your tests - we will call you with the results when they are available.      Bring in sputum sample for 3 successive days - first thing in am

## 2020-11-03 ENCOUNTER — Other Ambulatory Visit: Payer: BC Managed Care – PPO

## 2020-11-03 DIAGNOSIS — J449 Chronic obstructive pulmonary disease, unspecified: Secondary | ICD-10-CM

## 2020-11-04 ENCOUNTER — Other Ambulatory Visit: Payer: BC Managed Care – PPO

## 2020-11-04 DIAGNOSIS — J449 Chronic obstructive pulmonary disease, unspecified: Secondary | ICD-10-CM | POA: Diagnosis not present

## 2020-11-05 ENCOUNTER — Other Ambulatory Visit (INDEPENDENT_AMBULATORY_CARE_PROVIDER_SITE_OTHER): Payer: BC Managed Care – PPO

## 2020-11-05 DIAGNOSIS — R918 Other nonspecific abnormal finding of lung field: Secondary | ICD-10-CM | POA: Diagnosis not present

## 2020-11-05 DIAGNOSIS — J449 Chronic obstructive pulmonary disease, unspecified: Secondary | ICD-10-CM

## 2020-11-05 DIAGNOSIS — Z23 Encounter for immunization: Secondary | ICD-10-CM | POA: Diagnosis not present

## 2020-11-05 LAB — SEDIMENTATION RATE: Sed Rate: 12 mm/hr (ref 0–30)

## 2020-11-09 LAB — QUANTIFERON-TB GOLD PLUS
Mitogen-NIL: 2.43 IU/mL
NIL: 0.04 IU/mL
QuantiFERON-TB Gold Plus: NEGATIVE
TB1-NIL: <0 IU/mL
TB2-NIL: <0 IU/mL

## 2020-11-09 LAB — HISTOPLASMA ANTIGEN (BLD, CSF, BRONCH WASH, OTHER)
Interpretation:: NEGATIVE
RESULT:: NOT DETECTED ng/mL

## 2020-11-09 LAB — CRYPTOCOCCAL ANTIGEN: Cryptococcus Antigen, Serum: NEGATIVE

## 2020-11-10 NOTE — Progress Notes (Signed)
LMTCB

## 2020-11-11 ENCOUNTER — Telehealth: Payer: Self-pay | Admitting: Internal Medicine

## 2020-11-11 NOTE — Telephone Encounter (Signed)
Call made to patient, confirmed DOB. Made aware of results per MW. Voiced understanding. She is going to request the disc with her PET images be mailed to [place]. She just had a PET scan 10/24.   Nothing further needed at this time.

## 2020-11-17 ENCOUNTER — Encounter: Payer: Self-pay | Admitting: Gastroenterology

## 2020-11-24 DIAGNOSIS — G2581 Restless legs syndrome: Secondary | ICD-10-CM | POA: Diagnosis not present

## 2020-11-24 DIAGNOSIS — G35 Multiple sclerosis: Secondary | ICD-10-CM | POA: Diagnosis not present

## 2020-11-24 DIAGNOSIS — M542 Cervicalgia: Secondary | ICD-10-CM | POA: Diagnosis not present

## 2020-11-24 DIAGNOSIS — R569 Unspecified convulsions: Secondary | ICD-10-CM | POA: Diagnosis not present

## 2020-11-24 DIAGNOSIS — M545 Low back pain, unspecified: Secondary | ICD-10-CM | POA: Diagnosis not present

## 2020-12-19 LAB — AFB CULTURE WITH SMEAR (NOT AT ARMC)
Acid Fast Culture: NEGATIVE
Acid Fast Smear: NEGATIVE

## 2020-12-21 NOTE — Progress Notes (Signed)
Tried calling pt and there was no answer- LMTCB.  

## 2020-12-22 ENCOUNTER — Telehealth: Payer: Self-pay | Admitting: Internal Medicine

## 2020-12-22 ENCOUNTER — Telehealth: Payer: Self-pay

## 2020-12-22 NOTE — Telephone Encounter (Signed)
Spoke with pt who states she is having her PET done today and will bring the CD by the office after completed. Nothing further needed at this time.

## 2020-12-22 NOTE — Telephone Encounter (Signed)
Routing encounter to [name] as an Financial planner.

## 2020-12-25 LAB — AFB CULTURE WITH SMEAR (NOT AT ARMC)
Acid Fast Culture: NEGATIVE
Acid Fast Smear: NEGATIVE

## 2020-12-28 DIAGNOSIS — K449 Diaphragmatic hernia without obstruction or gangrene: Secondary | ICD-10-CM | POA: Diagnosis not present

## 2020-12-28 DIAGNOSIS — K219 Gastro-esophageal reflux disease without esophagitis: Secondary | ICD-10-CM | POA: Diagnosis not present

## 2020-12-28 NOTE — Telephone Encounter (Signed)
Received disc and report of PET scan- Done at [facility] on 10/30/20  Placed in Dr Thurston Hole lookat  Looks like the PET was ordered by Dr Aletha Halim- ? Has he reviewed with the pt?   LMTCB   Dr Sherene Sires- you can see the report in Care Everywhere

## 2020-12-28 NOTE — Telephone Encounter (Signed)
Spoke with pt  She states that she has already had someone go over the results of her PET  She understands it is concerning for malignancy  She wants to have MW review and advise her if she would be able to start on new med for MS  She is aware that MW out of the office until 01/05/21

## 2020-12-28 NOTE — Progress Notes (Signed)
Spoke with pt and notified of results. PET done at [facility] 10/30/20

## 2020-12-29 LAB — AFB CULTURE WITH SMEAR (NOT AT ARMC)
Acid Fast Culture: NEGATIVE
Acid Fast Smear: NEGATIVE

## 2021-01-05 NOTE — Telephone Encounter (Signed)
ATC patient but she did not answer. Her VM is full as well.   Will attempt to call back later.

## 2021-01-05 NOTE — Telephone Encounter (Signed)
No problem with MS meds   Make sure she has Pietro Cassis f/u ov with me w/in 3 months if wants my continued input/ follow up on the PET findings

## 2021-01-13 NOTE — Telephone Encounter (Signed)
Called and spoke with pt letting her know the info per Dr. Sherene Sires and she verbalized understanding. Pt wanted to hold off at this time making a f/u and would call back when ready. Nothing further needed.

## 2021-01-26 DIAGNOSIS — R569 Unspecified convulsions: Secondary | ICD-10-CM | POA: Diagnosis not present

## 2021-01-26 DIAGNOSIS — G603 Idiopathic progressive neuropathy: Secondary | ICD-10-CM | POA: Diagnosis not present

## 2021-01-26 DIAGNOSIS — G2581 Restless legs syndrome: Secondary | ICD-10-CM | POA: Diagnosis not present

## 2021-01-26 DIAGNOSIS — R269 Unspecified abnormalities of gait and mobility: Secondary | ICD-10-CM | POA: Diagnosis not present

## 2021-01-26 DIAGNOSIS — M545 Low back pain, unspecified: Secondary | ICD-10-CM | POA: Diagnosis not present

## 2021-02-11 DIAGNOSIS — G35 Multiple sclerosis: Secondary | ICD-10-CM | POA: Diagnosis not present

## 2021-02-25 DIAGNOSIS — G35 Multiple sclerosis: Secondary | ICD-10-CM | POA: Diagnosis not present

## 2021-04-26 ENCOUNTER — Inpatient Hospital Stay (HOSPITAL_COMMUNITY): Payer: Medicare Other

## 2021-04-26 ENCOUNTER — Emergency Department (HOSPITAL_COMMUNITY): Payer: Medicare Other

## 2021-04-26 ENCOUNTER — Inpatient Hospital Stay (HOSPITAL_COMMUNITY)
Admission: EM | Admit: 2021-04-26 | Discharge: 2021-05-18 | DRG: 871 | Disposition: A | Discharge status: Home Health | Payer: Medicare Other | Attending: Internal Medicine | Admitting: Internal Medicine

## 2021-04-26 ENCOUNTER — Other Ambulatory Visit: Payer: Self-pay

## 2021-04-26 DIAGNOSIS — R739 Hyperglycemia, unspecified: Secondary | ICD-10-CM | POA: Diagnosis not present

## 2021-04-26 DIAGNOSIS — Z888 Allergy status to other drugs, medicaments and biological substances status: Secondary | ICD-10-CM

## 2021-04-26 DIAGNOSIS — E875 Hyperkalemia: Secondary | ICD-10-CM | POA: Diagnosis not present

## 2021-04-26 DIAGNOSIS — J95811 Postprocedural pneumothorax: Secondary | ICD-10-CM | POA: Diagnosis not present

## 2021-04-26 DIAGNOSIS — G35 Multiple sclerosis: Secondary | ICD-10-CM | POA: Diagnosis present

## 2021-04-26 DIAGNOSIS — Z23 Encounter for immunization: Secondary | ICD-10-CM

## 2021-04-26 DIAGNOSIS — G40409 Other generalized epilepsy and epileptic syndromes, not intractable, without status epilepticus: Secondary | ICD-10-CM | POA: Diagnosis present

## 2021-04-26 DIAGNOSIS — K219 Gastro-esophageal reflux disease without esophagitis: Secondary | ICD-10-CM

## 2021-04-26 DIAGNOSIS — R0902 Hypoxemia: Secondary | ICD-10-CM | POA: Diagnosis not present

## 2021-04-26 DIAGNOSIS — Z452 Encounter for adjustment and management of vascular access device: Secondary | ICD-10-CM | POA: Diagnosis not present

## 2021-04-26 DIAGNOSIS — R5381 Other malaise: Secondary | ICD-10-CM | POA: Diagnosis not present

## 2021-04-26 DIAGNOSIS — Y92239 Unspecified place in hospital as the place of occurrence of the external cause: Secondary | ICD-10-CM | POA: Diagnosis not present

## 2021-04-26 DIAGNOSIS — Z1589 Genetic susceptibility to other disease: Secondary | ICD-10-CM

## 2021-04-26 DIAGNOSIS — N170 Acute kidney failure with tubular necrosis: Secondary | ICD-10-CM | POA: Diagnosis not present

## 2021-04-26 DIAGNOSIS — R579 Shock, unspecified: Secondary | ICD-10-CM | POA: Diagnosis present

## 2021-04-26 DIAGNOSIS — R6521 Severe sepsis with septic shock: Principal | ICD-10-CM

## 2021-04-26 DIAGNOSIS — R569 Unspecified convulsions: Secondary | ICD-10-CM

## 2021-04-26 DIAGNOSIS — J9601 Acute respiratory failure with hypoxia: Secondary | ICD-10-CM | POA: Diagnosis not present

## 2021-04-26 DIAGNOSIS — D649 Anemia, unspecified: Secondary | ICD-10-CM | POA: Insufficient documentation

## 2021-04-26 DIAGNOSIS — R1314 Dysphagia, pharyngoesophageal phase: Secondary | ICD-10-CM | POA: Diagnosis not present

## 2021-04-26 DIAGNOSIS — Z781 Physical restraint status: Secondary | ICD-10-CM

## 2021-04-26 DIAGNOSIS — E162 Hypoglycemia, unspecified: Secondary | ICD-10-CM | POA: Diagnosis not present

## 2021-04-26 DIAGNOSIS — F05 Delirium due to known physiological condition: Secondary | ICD-10-CM | POA: Diagnosis not present

## 2021-04-26 DIAGNOSIS — D849 Immunodeficiency, unspecified: Secondary | ICD-10-CM | POA: Diagnosis present

## 2021-04-26 DIAGNOSIS — Z20822 Contact with and (suspected) exposure to covid-19: Secondary | ICD-10-CM | POA: Diagnosis present

## 2021-04-26 DIAGNOSIS — A419 Sepsis, unspecified organism: Secondary | ICD-10-CM | POA: Diagnosis not present

## 2021-04-26 DIAGNOSIS — E876 Hypokalemia: Secondary | ICD-10-CM | POA: Diagnosis not present

## 2021-04-26 DIAGNOSIS — J982 Interstitial emphysema: Secondary | ICD-10-CM | POA: Diagnosis not present

## 2021-04-26 DIAGNOSIS — I878 Other specified disorders of veins: Secondary | ICD-10-CM | POA: Diagnosis present

## 2021-04-26 DIAGNOSIS — R001 Bradycardia, unspecified: Secondary | ICD-10-CM | POA: Diagnosis not present

## 2021-04-26 DIAGNOSIS — R911 Solitary pulmonary nodule: Secondary | ICD-10-CM

## 2021-04-26 DIAGNOSIS — G9341 Metabolic encephalopathy: Secondary | ICD-10-CM | POA: Diagnosis not present

## 2021-04-26 DIAGNOSIS — R29810 Facial weakness: Secondary | ICD-10-CM | POA: Diagnosis not present

## 2021-04-26 DIAGNOSIS — T82897A Other specified complication of cardiac prosthetic devices, implants and grafts, initial encounter: Secondary | ICD-10-CM | POA: Diagnosis not present

## 2021-04-26 DIAGNOSIS — J690 Pneumonitis due to inhalation of food and vomit: Secondary | ICD-10-CM | POA: Diagnosis not present

## 2021-04-26 DIAGNOSIS — R079 Chest pain, unspecified: Secondary | ICD-10-CM | POA: Diagnosis not present

## 2021-04-26 DIAGNOSIS — M459 Ankylosing spondylitis of unspecified sites in spine: Secondary | ICD-10-CM | POA: Diagnosis not present

## 2021-04-26 DIAGNOSIS — J95812 Postprocedural air leak: Secondary | ICD-10-CM | POA: Diagnosis not present

## 2021-04-26 DIAGNOSIS — L03116 Cellulitis of left lower limb: Secondary | ICD-10-CM | POA: Diagnosis present

## 2021-04-26 DIAGNOSIS — L03115 Cellulitis of right lower limb: Secondary | ICD-10-CM | POA: Diagnosis not present

## 2021-04-26 DIAGNOSIS — R000 Tachycardia, unspecified: Secondary | ICD-10-CM | POA: Diagnosis not present

## 2021-04-26 DIAGNOSIS — E870 Hyperosmolality and hypernatremia: Secondary | ICD-10-CM | POA: Diagnosis not present

## 2021-04-26 DIAGNOSIS — R471 Dysarthria and anarthria: Secondary | ICD-10-CM | POA: Diagnosis not present

## 2021-04-26 DIAGNOSIS — Y848 Other medical procedures as the cause of abnormal reaction of the patient, or of later complication, without mention of misadventure at the time of the procedure: Secondary | ICD-10-CM | POA: Diagnosis not present

## 2021-04-26 DIAGNOSIS — J90 Pleural effusion, not elsewhere classified: Secondary | ICD-10-CM | POA: Diagnosis not present

## 2021-04-26 DIAGNOSIS — R918 Other nonspecific abnormal finding of lung field: Secondary | ICD-10-CM | POA: Diagnosis not present

## 2021-04-26 DIAGNOSIS — Z4682 Encounter for fitting and adjustment of non-vascular catheter: Secondary | ICD-10-CM | POA: Diagnosis not present

## 2021-04-26 DIAGNOSIS — R0602 Shortness of breath: Secondary | ICD-10-CM | POA: Diagnosis not present

## 2021-04-26 DIAGNOSIS — E860 Dehydration: Secondary | ICD-10-CM | POA: Diagnosis present

## 2021-04-26 DIAGNOSIS — R4182 Altered mental status, unspecified: Secondary | ICD-10-CM | POA: Diagnosis not present

## 2021-04-26 DIAGNOSIS — N179 Acute kidney failure, unspecified: Secondary | ICD-10-CM | POA: Diagnosis not present

## 2021-04-26 DIAGNOSIS — E861 Hypovolemia: Secondary | ICD-10-CM

## 2021-04-26 DIAGNOSIS — J939 Pneumothorax, unspecified: Secondary | ICD-10-CM | POA: Insufficient documentation

## 2021-04-26 DIAGNOSIS — J9811 Atelectasis: Secondary | ICD-10-CM | POA: Diagnosis not present

## 2021-04-26 DIAGNOSIS — R531 Weakness: Secondary | ICD-10-CM | POA: Diagnosis not present

## 2021-04-26 DIAGNOSIS — J432 Centrilobular emphysema: Secondary | ICD-10-CM | POA: Diagnosis present

## 2021-04-26 DIAGNOSIS — R1310 Dysphagia, unspecified: Secondary | ICD-10-CM | POA: Diagnosis not present

## 2021-04-26 DIAGNOSIS — I10 Essential (primary) hypertension: Secondary | ICD-10-CM | POA: Diagnosis not present

## 2021-04-26 DIAGNOSIS — F17210 Nicotine dependence, cigarettes, uncomplicated: Secondary | ICD-10-CM | POA: Diagnosis present

## 2021-04-26 DIAGNOSIS — L03119 Cellulitis of unspecified part of limb: Secondary | ICD-10-CM

## 2021-04-26 DIAGNOSIS — Z79899 Other long term (current) drug therapy: Secondary | ICD-10-CM

## 2021-04-26 DIAGNOSIS — J9690 Respiratory failure, unspecified, unspecified whether with hypoxia or hypercapnia: Secondary | ICD-10-CM | POA: Diagnosis not present

## 2021-04-26 DIAGNOSIS — E8729 Other acidosis: Secondary | ICD-10-CM | POA: Diagnosis not present

## 2021-04-26 DIAGNOSIS — Z9981 Dependence on supplemental oxygen: Secondary | ICD-10-CM

## 2021-04-26 DIAGNOSIS — Z885 Allergy status to narcotic agent status: Secondary | ICD-10-CM

## 2021-04-26 DIAGNOSIS — T380X5A Adverse effect of glucocorticoids and synthetic analogues, initial encounter: Secondary | ICD-10-CM | POA: Diagnosis not present

## 2021-04-26 DIAGNOSIS — M797 Fibromyalgia: Secondary | ICD-10-CM | POA: Diagnosis present

## 2021-04-26 DIAGNOSIS — I700 Atherosclerosis of aorta: Secondary | ICD-10-CM | POA: Diagnosis not present

## 2021-04-26 DIAGNOSIS — J439 Emphysema, unspecified: Secondary | ICD-10-CM | POA: Diagnosis not present

## 2021-04-26 DIAGNOSIS — I959 Hypotension, unspecified: Secondary | ICD-10-CM | POA: Diagnosis not present

## 2021-04-26 DIAGNOSIS — G40909 Epilepsy, unspecified, not intractable, without status epilepticus: Secondary | ICD-10-CM

## 2021-04-26 DIAGNOSIS — R0603 Acute respiratory distress: Secondary | ICD-10-CM | POA: Diagnosis not present

## 2021-04-26 DIAGNOSIS — Z833 Family history of diabetes mellitus: Secondary | ICD-10-CM

## 2021-04-26 DIAGNOSIS — M7989 Other specified soft tissue disorders: Secondary | ICD-10-CM | POA: Diagnosis not present

## 2021-04-26 DIAGNOSIS — E872 Acidosis, unspecified: Secondary | ICD-10-CM

## 2021-04-26 DIAGNOSIS — K589 Irritable bowel syndrome without diarrhea: Secondary | ICD-10-CM | POA: Diagnosis present

## 2021-04-26 DIAGNOSIS — Z951 Presence of aortocoronary bypass graft: Secondary | ICD-10-CM

## 2021-04-26 DIAGNOSIS — J9311 Primary spontaneous pneumothorax: Secondary | ICD-10-CM | POA: Diagnosis not present

## 2021-04-26 DIAGNOSIS — F419 Anxiety disorder, unspecified: Secondary | ICD-10-CM | POA: Diagnosis present

## 2021-04-26 DIAGNOSIS — L89152 Pressure ulcer of sacral region, stage 2: Secondary | ICD-10-CM | POA: Diagnosis not present

## 2021-04-26 DIAGNOSIS — Z8249 Family history of ischemic heart disease and other diseases of the circulatory system: Secondary | ICD-10-CM

## 2021-04-26 DIAGNOSIS — L8990 Pressure ulcer of unspecified site, unspecified stage: Secondary | ICD-10-CM | POA: Insufficient documentation

## 2021-04-26 HISTORY — DX: Multiple sclerosis: G35

## 2021-04-26 LAB — GLUCOSE, CAPILLARY
Glucose-Capillary: 110 mg/dL — ABNORMAL HIGH (ref 70–99)
Glucose-Capillary: 207 mg/dL — ABNORMAL HIGH (ref 70–99)
Glucose-Capillary: 275 mg/dL — ABNORMAL HIGH (ref 70–99)

## 2021-04-26 LAB — COMPREHENSIVE METABOLIC PANEL
ALT: 17 U/L (ref 0–44)
AST: 21 U/L (ref 15–41)
Albumin: 3.1 g/dL — ABNORMAL LOW (ref 3.5–5.0)
Alkaline Phosphatase: 84 U/L (ref 38–126)
Anion gap: 23 — ABNORMAL HIGH (ref 5–15)
BUN: 120 mg/dL — ABNORMAL HIGH (ref 8–23)
CO2: 9 mmol/L — ABNORMAL LOW (ref 22–32)
Calcium: 7 mg/dL — ABNORMAL LOW (ref 8.9–10.3)
Chloride: 103 mmol/L (ref 98–111)
Creatinine, Ser: 7.81 mg/dL — ABNORMAL HIGH (ref 0.44–1.00)
GFR, Estimated: 5 mL/min — ABNORMAL LOW (ref >60)
Glucose, Bld: 72 mg/dL (ref 70–99)
Potassium: 6.2 mmol/L — ABNORMAL HIGH (ref 3.5–5.1)
Sodium: 135 mmol/L (ref 135–145)
Total Bilirubin: 0.7 mg/dL (ref 0.3–1.2)
Total Protein: 6.3 g/dL — ABNORMAL LOW (ref 6.5–8.1)

## 2021-04-26 LAB — URINALYSIS, MICROSCOPIC (REFLEX)

## 2021-04-26 LAB — BASIC METABOLIC PANEL
Anion gap: 14 (ref 5–15)
BUN: 99 mg/dL — ABNORMAL HIGH (ref 8–23)
CO2: 10 mmol/L — ABNORMAL LOW (ref 22–32)
Calcium: 6.1 mg/dL — CL (ref 8.9–10.3)
Chloride: 114 mmol/L — ABNORMAL HIGH (ref 98–111)
Creatinine, Ser: 4.71 mg/dL — ABNORMAL HIGH (ref 0.44–1.00)
GFR, Estimated: 10 mL/min — ABNORMAL LOW (ref >60)
Glucose, Bld: 159 mg/dL — ABNORMAL HIGH (ref 70–99)
Potassium: 5 mmol/L (ref 3.5–5.1)
Sodium: 138 mmol/L (ref 135–145)

## 2021-04-26 LAB — POCT I-STAT 7, (LYTES, BLD GAS, ICA,H+H)
Acid-base deficit: 16 mmol/L — ABNORMAL HIGH (ref 0.0–2.0)
Acid-base deficit: 9 mmol/L — ABNORMAL HIGH (ref 0.0–2.0)
Bicarbonate: 10.2 mmol/L — ABNORMAL LOW (ref 20.0–28.0)
Bicarbonate: 15.2 mmol/L — ABNORMAL LOW (ref 20.0–28.0)
Calcium, Ion: 0.93 mmol/L — ABNORMAL LOW (ref 1.15–1.40)
Calcium, Ion: 1 mmol/L — ABNORMAL LOW (ref 1.15–1.40)
HCT: 29 % — ABNORMAL LOW (ref 36.0–46.0)
HCT: 30 % — ABNORMAL LOW (ref 36.0–46.0)
Hemoglobin: 10.2 g/dL — ABNORMAL LOW (ref 12.0–15.0)
Hemoglobin: 9.9 g/dL — ABNORMAL LOW (ref 12.0–15.0)
O2 Saturation: 86 %
O2 Saturation: 90 %
Potassium: 4.3 mmol/L (ref 3.5–5.1)
Potassium: 4.9 mmol/L (ref 3.5–5.1)
Sodium: 134 mmol/L — ABNORMAL LOW (ref 135–145)
Sodium: 135 mmol/L (ref 135–145)
TCO2: 11 mmol/L — ABNORMAL LOW (ref 22–32)
TCO2: 16 mmol/L — ABNORMAL LOW (ref 22–32)
pCO2 arterial: 26.2 mmHg — ABNORMAL LOW (ref 32–48)
pCO2 arterial: 28.4 mmHg — ABNORMAL LOW (ref 32–48)
pH, Arterial: 7.198 — CL (ref 7.35–7.45)
pH, Arterial: 7.336 — ABNORMAL LOW (ref 7.35–7.45)
pO2, Arterial: 54 mmHg — ABNORMAL LOW (ref 83–108)
pO2, Arterial: 71 mmHg — ABNORMAL LOW (ref 83–108)

## 2021-04-26 LAB — URINALYSIS, ROUTINE W REFLEX MICROSCOPIC
Bilirubin Urine: NEGATIVE
Glucose, UA: NEGATIVE mg/dL
Ketones, ur: NEGATIVE mg/dL
Leukocytes,Ua: NEGATIVE
Nitrite: NEGATIVE
Protein, ur: 30 mg/dL — AB
Specific Gravity, Urine: >1.03 — ABNORMAL HIGH (ref 1.005–1.030)
pH: 5.5 (ref 5.0–8.0)

## 2021-04-26 LAB — CBC
HCT: 37.4 % (ref 36.0–46.0)
Hemoglobin: 12.3 g/dL (ref 12.0–15.0)
MCH: 29.9 pg (ref 26.0–34.0)
MCHC: 32.9 g/dL (ref 30.0–36.0)
MCV: 90.8 fL (ref 80.0–100.0)
Platelets: 330 10*3/uL (ref 150–400)
RBC: 4.12 MIL/uL (ref 3.87–5.11)
RDW: 14.8 % (ref 11.5–15.5)
WBC: 17.2 10*3/uL — ABNORMAL HIGH (ref 4.0–10.5)
nRBC: 0 % (ref 0.0–0.2)

## 2021-04-26 LAB — APTT: aPTT: 33 seconds (ref 24–36)

## 2021-04-26 LAB — RESP PANEL BY RT-PCR (FLU A&B, COVID) ARPGX2
Influenza A by PCR: NEGATIVE
Influenza B by PCR: NEGATIVE
SARS Coronavirus 2 by RT PCR: NEGATIVE

## 2021-04-26 LAB — CBG MONITORING, ED: Glucose-Capillary: 97 mg/dL (ref 70–99)

## 2021-04-26 LAB — SODIUM, URINE, RANDOM: Sodium, Ur: 16 mmol/L

## 2021-04-26 LAB — HEMOGLOBIN A1C
Hgb A1c MFr Bld: 5 % (ref 4.8–5.6)
Mean Plasma Glucose: 96.8 mg/dL

## 2021-04-26 LAB — HIV ANTIBODY (ROUTINE TESTING W REFLEX): HIV Screen 4th Generation wRfx: NONREACTIVE

## 2021-04-26 LAB — PROTIME-INR
INR: 1.3 — ABNORMAL HIGH (ref 0.8–1.2)
Prothrombin Time: 16.4 seconds — ABNORMAL HIGH (ref 11.4–15.2)

## 2021-04-26 LAB — PROCALCITONIN: Procalcitonin: 0.78 ng/mL

## 2021-04-26 LAB — CREATININE, URINE, RANDOM: Creatinine, Urine: 154.82 mg/dL

## 2021-04-26 LAB — LACTIC ACID, PLASMA: Lactic Acid, Venous: 1.4 mmol/L (ref 0.5–1.9)

## 2021-04-26 LAB — MRSA NEXT GEN BY PCR, NASAL: MRSA by PCR Next Gen: NOT DETECTED

## 2021-04-26 LAB — BRAIN NATRIURETIC PEPTIDE: B Natriuretic Peptide: 49.3 pg/mL (ref 0.0–100.0)

## 2021-04-26 LAB — CK: Total CK: 941 U/L — ABNORMAL HIGH (ref 38–234)

## 2021-04-26 MED ORDER — ORAL CARE MOUTH RINSE
15.0000 mL | Freq: Two times a day (BID) | OROMUCOSAL | Status: DC
  Administered 2021-04-26 – 2021-04-27 (×3): 15 mL via OROMUCOSAL

## 2021-04-26 MED ORDER — FENTANYL CITRATE PF 50 MCG/ML IJ SOSY
50.0000 ug | PREFILLED_SYRINGE | Freq: Once | INTRAMUSCULAR | Status: AC
  Administered 2021-04-26: 50 ug via INTRAVENOUS
  Filled 2021-04-26: qty 1

## 2021-04-26 MED ORDER — SODIUM CHLORIDE 0.9 % IV BOLUS
1000.0000 mL | Freq: Once | INTRAVENOUS | Status: AC
  Administered 2021-04-26: 1000 mL via INTRAVENOUS

## 2021-04-26 MED ORDER — HYDROCORTISONE SOD SUC (PF) 100 MG IJ SOLR
100.0000 mg | Freq: Two times a day (BID) | INTRAMUSCULAR | Status: DC
  Administered 2021-04-26 – 2021-04-29 (×6): 100 mg via INTRAVENOUS
  Filled 2021-04-26 (×6): qty 2

## 2021-04-26 MED ORDER — STERILE WATER FOR INJECTION IV SOLN: INTRAVENOUS | Status: DC

## 2021-04-26 MED ORDER — ONDANSETRON HCL 4 MG/2ML IJ SOLN
4.0000 mg | Freq: Four times a day (QID) | INTRAMUSCULAR | Status: DC | PRN
  Administered 2021-05-06: 4 mg via INTRAVENOUS
  Filled 2021-04-26: qty 2

## 2021-04-26 MED ORDER — SODIUM CHLORIDE 0.9 % IV BOLUS
500.0000 mL | Freq: Once | INTRAVENOUS | Status: AC
  Administered 2021-04-26: 500 mL via INTRAVENOUS

## 2021-04-26 MED ORDER — FENTANYL CITRATE (PF) 100 MCG/2ML IJ SOLN
50.0000 ug | INTRAMUSCULAR | Status: DC | PRN
  Administered 2021-04-29 (×7): 50 ug via INTRAVENOUS
  Filled 2021-04-26 (×7): qty 2

## 2021-04-26 MED ORDER — INSULIN ASPART 100 UNIT/ML IV SOLN
5.0000 [IU] | Freq: Once | INTRAVENOUS | Status: AC
  Administered 2021-04-26: 5 [IU] via INTRAVENOUS

## 2021-04-26 MED ORDER — SODIUM CHLORIDE 0.9 % IV BOLUS: 500.0000 mL | Freq: Once | INTRAVENOUS | Status: DC

## 2021-04-26 MED ORDER — SODIUM BICARBONATE 8.4 % IV SOLN
100.0000 meq | Freq: Once | INTRAVENOUS | Status: AC
  Administered 2021-04-26: 100 meq via INTRAVENOUS
  Filled 2021-04-26: qty 100

## 2021-04-26 MED ORDER — CALCIUM GLUCONATE-NACL 1-0.675 GM/50ML-% IV SOLN
1.0000 g | Freq: Once | INTRAVENOUS | Status: AC
  Administered 2021-04-26: 1000 mg via INTRAVENOUS
  Filled 2021-04-26: qty 50

## 2021-04-26 MED ORDER — INSULIN ASPART 100 UNIT/ML IJ SOLN
0.0000 [IU] | INTRAMUSCULAR | Status: DC
  Administered 2021-04-26: 3 [IU] via SUBCUTANEOUS
  Administered 2021-04-26: 5 [IU] via SUBCUTANEOUS
  Administered 2021-04-27: 3 [IU] via SUBCUTANEOUS
  Administered 2021-04-27: 2 [IU] via SUBCUTANEOUS

## 2021-04-26 MED ORDER — SODIUM CHLORIDE 0.9 % IV SOLN
2.0000 g | Freq: Once | INTRAVENOUS | Status: AC
  Administered 2021-04-26: 2 g via INTRAVENOUS
  Filled 2021-04-26: qty 12.5

## 2021-04-26 MED ORDER — VANCOMYCIN HCL 500 MG/100ML IV SOLN
500.0000 mg | INTRAVENOUS | Status: AC
  Administered 2021-04-26: 500 mg via INTRAVENOUS
  Filled 2021-04-26: qty 100

## 2021-04-26 MED ORDER — SODIUM CHLORIDE 0.9% FLUSH
10.0000 mL | Freq: Two times a day (BID) | INTRAVENOUS | Status: DC
  Administered 2021-04-26 – 2021-04-27 (×2): 10 mL
  Administered 2021-04-28: 30 mL
  Administered 2021-04-29 (×2): 10 mL
  Administered 2021-04-30: 30 mL
  Administered 2021-04-30: 10 mL
  Administered 2021-05-01: 30 mL
  Administered 2021-05-01: 10 mL
  Administered 2021-05-02: 30 mL
  Administered 2021-05-02: 10 mL
  Administered 2021-05-03: 30 mL
  Administered 2021-05-03 – 2021-05-04 (×2): 10 mL
  Administered 2021-05-04: 30 mL
  Administered 2021-05-05 – 2021-05-18 (×23): 10 mL

## 2021-04-26 MED ORDER — VASOPRESSIN 20 UNITS/100 ML INFUSION FOR SHOCK
0.0400 [IU]/min | INTRAVENOUS | Status: DC
Start: 2021-04-26 — End: 2021-04-29
  Administered 2021-04-26 – 2021-04-28 (×4): 0.04 [IU]/min via INTRAVENOUS
  Filled 2021-04-26 (×5): qty 100

## 2021-04-26 MED ORDER — SODIUM CHLORIDE 0.9 % IV BOLUS
2000.0000 mL | Freq: Once | INTRAVENOUS | Status: AC
  Administered 2021-04-26: 2000 mL via INTRAVENOUS

## 2021-04-26 MED ORDER — CHLORHEXIDINE GLUCONATE CLOTH 2 % EX PADS
6.0000 | MEDICATED_PAD | Freq: Every day | CUTANEOUS | Status: DC
  Administered 2021-04-26 – 2021-05-14 (×21): 6 via TOPICAL

## 2021-04-26 MED ORDER — SODIUM CHLORIDE 0.9% FLUSH
10.0000 mL | INTRAVENOUS | Status: DC | PRN
  Administered 2021-05-06: 3 mL

## 2021-04-26 MED ORDER — SODIUM ZIRCONIUM CYCLOSILICATE 10 G PO PACK: 10.0000 g | PACK | Freq: Three times a day (TID) | ORAL | Status: DC

## 2021-04-26 MED ORDER — STERILE WATER FOR INJECTION IV SOLN
INTRAMUSCULAR | Status: DC
  Filled 2021-04-26 (×2): qty 1000

## 2021-04-26 MED ORDER — SODIUM BICARBONATE 8.4 % IV SOLN
Freq: Once | INTRAVENOUS | Status: AC
  Filled 2021-04-26: qty 1000

## 2021-04-26 MED ORDER — HEPARIN SODIUM (PORCINE) 5000 UNIT/ML IJ SOLN
5000.0000 [IU] | Freq: Three times a day (TID) | INTRAMUSCULAR | Status: DC
  Administered 2021-04-26 – 2021-05-16 (×59): 5000 [IU] via SUBCUTANEOUS
  Filled 2021-04-26 (×59): qty 1

## 2021-04-26 MED ORDER — SODIUM CHLORIDE 0.9 % IV BOLUS
400.0000 mL | Freq: Once | INTRAVENOUS | Status: AC
  Administered 2021-04-26: 400 mL via INTRAVENOUS

## 2021-04-26 MED ORDER — PIPERACILLIN-TAZOBACTAM 3.375 G IVPB
3.3750 g | Freq: Two times a day (BID) | INTRAVENOUS | Status: DC
  Administered 2021-04-27: 3.375 g via INTRAVENOUS
  Filled 2021-04-26 (×2): qty 50

## 2021-04-26 MED ORDER — VANCOMYCIN VARIABLE DOSE PER UNSTABLE RENAL FUNCTION (PHARMACIST DOSING)
Status: DC
  Filled 2021-04-26: qty 1

## 2021-04-26 MED ORDER — DEXTROSE 50 % IV SOLN
1.0000 | Freq: Once | INTRAVENOUS | Status: AC
Start: 2021-04-26 — End: 2021-04-26
  Administered 2021-04-26: 50 mL via INTRAVENOUS
  Filled 2021-04-26: qty 50

## 2021-04-26 MED ORDER — SODIUM CHLORIDE 0.9% FLUSH
10.0000 mL | Freq: Three times a day (TID) | INTRAVENOUS | Status: DC
  Administered 2021-04-27 – 2021-05-06 (×26): 10 mL
  Administered 2021-05-06: 3 mL
  Administered 2021-05-07 – 2021-05-14 (×16): 10 mL

## 2021-04-26 MED ORDER — VANCOMYCIN HCL IN DEXTROSE 1-5 GM/200ML-% IV SOLN
1000.0000 mg | Freq: Once | INTRAVENOUS | Status: AC
  Administered 2021-04-26: 1000 mg via INTRAVENOUS
  Filled 2021-04-26: qty 200

## 2021-04-26 MED ORDER — NOREPINEPHRINE 4 MG/250ML-% IV SOLN
0.0000 ug/min | INTRAVENOUS | Status: DC
  Administered 2021-04-26: 20 ug/min via INTRAVENOUS
  Administered 2021-04-26 (×2): 24 ug/min via INTRAVENOUS
  Administered 2021-04-26: 5 ug/min via INTRAVENOUS
  Administered 2021-04-27: 19 ug/min via INTRAVENOUS
  Administered 2021-04-27: 10 ug/min via INTRAVENOUS
  Administered 2021-04-27: 13 ug/min via INTRAVENOUS
  Administered 2021-04-27: 17 ug/min via INTRAVENOUS
  Administered 2021-04-28: 4 ug/min via INTRAVENOUS
  Administered 2021-04-30: 2 ug/min via INTRAVENOUS
  Filled 2021-04-26 (×11): qty 250

## 2021-04-26 NOTE — Progress Notes (Signed)
Bedside echo: vigorous biventricular function. ?Starting to make urine. ?Think we may be able to avoid HD but will see what 4PM labs show. ?

## 2021-04-26 NOTE — Progress Notes (Signed)
Notified provider and bedside nurse of need to order and administer fluid bolus pt needs 1905 cc.  ?

## 2021-04-26 NOTE — Progress Notes (Signed)
eLink Physician-Brief Progress Note ?Patient Name: [name] ?DOB: 1960-10-17 ?MRN: [id number] ? ? ?Date of Service ? 04/26/2021  ?HPI/Events of Note ? Increasing confusion - Nursing request for ABG and NT suctioning.   ?eICU Interventions ? Plan: ?ABG STAT. ?NT suction PRN.   ? ? ? ?Intervention Category ?Major Interventions: Delirium, psychosis, severe agitation - evaluation and management ? ?Quinisha Mould Dennard Nip ?04/26/2021, 8:51 PM ?

## 2021-04-26 NOTE — Consult Note (Signed)
WOC Nurse Consult Note: ?Patient receiving care in [facility] ED[facility]. Consult completed remotely after review of record and images. ?Reason for Consult: BLE wounds ?Wound type: unclear etiology at this time. ?Pressure Injury POA: Yes/No/NA ?Measurement: ?Wound bed: see photos ?Drainage (amount, consistency, odor)  ?Periwound: see images ?Dressing procedure/placement/frequency: ?Wash legs with soap and water. Pat dry. Apply Sween Moisturizing Ointment to intact skin. Place as many Xeroform gauzes [place] [phone]) as necessary to cover wound areas. Beginning behind the toes and going to just below the knees spiral wrap kerlix. Perform daily.  ? ?CT of right leg and foot is ordered.  If compression is needed, an ABI is needed to ensure safety with compression. ? ?WOC nurse will not follow at this time.  Please re-consult the WOC team if needed. ? ?Helmut Muster, RN, MSN, CWOCN, CNS-BC, pager [phone]  ?  ?

## 2021-04-26 NOTE — ED Notes (Signed)
Critical care MD at bedside 

## 2021-04-26 NOTE — Procedures (Signed)
Insertion of Chest Tube Procedure Note ? ?[name]  ?[id number]  ?December 06, 1960 ? ?Date:04/26/21  ?Time:4:59 PM  ? ? ?Provider Performing: Shelby Mattocks  ? ?Procedure: Chest Tube Insertion (17793) ? ?Indication(s) ?Pneumothorax ? ?Consent ?Unable to obtain consent due to emergent nature of procedure. ? ?Anesthesia ?Topical only with 1% lidocaine  ? ? ?Time Out ?Verified patient identification, verified procedure, site/side was marked, verified correct patient position, special equipment/implants available, medications/allergies/relevant history reviewed, required imaging and test results available. ? ? ?Sterile Technique ?Maximal sterile technique including full sterile barrier drape, hand hygiene, sterile gown, sterile gloves, mask, hair covering, sterile ultrasound probe cover (if used). ? ? ?Procedure Description ?Ultrasound not used to identify appropriate pleural anatomy for placement and overlying skin marked. Area of placement cleaned and draped in sterile fashion.  A 14   French chest tube was placed into the right pleural space using Seldinger technique. Appropriate return of air was obtained.  The tube was connected to atrium and placed on -20 cm H2O wall suction. ? ? ?Complications/Tolerance ?None; patient tolerated the procedure well. ?Chest X-ray is ordered to verify placement. ? ? ?EBL ?Minimal ? ?Specimen(s) ?none ? ?Simonne Martinet ACNP-BC ?[facility] ?Pager # [phone] OR # [phone] if no answer ? ?

## 2021-04-26 NOTE — ED Notes (Signed)
Nephrology at bedside

## 2021-04-26 NOTE — Progress Notes (Signed)
Elink following code sepsis °

## 2021-04-26 NOTE — H&P (Signed)
? ?NAME:  [name], MRN:  [id number], DOB:  03-09-1960, LOS: 0 ?ADMISSION DATE:  04/26/2021, CONSULTATION DATE:  4/17 ?REFERRING MD:  Zavits, CHIEF COMPLAINT:  septic shock  ? ?History of Present Illness:  ?This is a 61 yof w/ h/o MS (walks w/ walker at baseline). She started being treated w/ Ocrelizumab back in Feb. Per pt and family about 4 weeks after began to notice increased RLE swelling, scaling and weakness. She attributed this to an expected side effects of the medication. Then about 5-7d prior to presentation she developed increased weakness, worsening right leg pain, poor PO intake, and neck pain. These symptoms continued to worsen to point pain was no longer bearable and she was so weak that she was no longer able to get OOB.  ? ?In ER she was  hypotensive in 70s, tachycardic, encephalopathic, wbc ct 17.2, CO2 9 w/ + anion gap metabolic acidosis, BUN 540, creatinine 7.81 and K 6.2 ? ? ?Pertinent  Medical History  ?MS walks w/ walker, GERD, HLA B27 positive, Pulm nodule, seizure d/o ? ?Significant Hospital Events: ?Including procedures, antibiotic start and stop dates in addition to other pertinent events   ?4/17 admitted w/ septic shock working dx cellulitis (RLE), Cultures sent, MRI ordered C-spine, CT lower ext ordered, Zosyn and Vanc started  ? ?Interim History / Subjective:  ?Still tachycardic and hypotensive after 1 liter crystalloid  ? ?Objective   ?Blood pressure (Abnormal) 76/30, pulse (Abnormal) 109, temperature 97.7 ?F (36.5 ?C), temperature source Oral, resp. rate (Abnormal) 23, height 5' 4"  (1.626 m), weight 63.5 kg, SpO2 100 %. ?   ?   ?No intake or output data in the 24 hours ending 04/26/21 1202 ?Filed Weights  ? 04/26/21 0917  ?Weight: 63.5 kg  ? ? ?Examination: ?General: 61 year old chronically ill appearing female. Speech is slurred, MM dry. Still hypotensive  ?HENT: MM dry and cracked. Neck veins flat. Sclera not icteric  ?Lungs: some rhonchi w/ cough  ?Cardiovascular: tachy rrr   ?Abdomen: soft not tender  ?Extremities: RLE is scaled, red, swollen and painful to touch w/ ulcerations. The left Lower ext also has open ulceration (see pictures below)  ?Neuro: Awake and alert. Some difficulty w/ memory and recall  ?GU: due to void  ? ? ? ? ? ? ? ?Resolved Hospital Problem list   ? ? ?Assessment & Plan:  ?Principal Problem: ?  Shock ([place]) ?Active Problems: ?  Anemia ?  GERD (gastroesophageal reflux disease) ?  HLA B27 (HLA B27 positive) ?  Seizures ([place]) ?  Pulmonary nodule ?  MS (multiple sclerosis) ([place]) ?  AKI (acute kidney injury) ([place]) ?  Septic shock ([place]) ?  Cellulitis of both lower extremities ?  High anion gap metabolic acidosis ?  Hyperkalemia ?  Acute metabolic encephalopathy ? ?Septic shock 2/2 bilateral Lower extremity Cellulitis in pt who is immunocompromised from both h/o being HLA B27 positive And s/p Ocrelizumab  ?-given clinical presentation (neck pain and weakness) I worry about possible bacteremia and possible even C-spine infection  ?-she is still under-resuscitated  ?Plan  ?Repeating fluid bolus ?CT Lower extremities to f/o deep tissue infection more involved than simple cellulitis ?MRI C spine looking for abscess ?CT chest (has h/o cough and chronic cavitary LUL lesion) ?Cultures sent  ?Vanc and zosyn started ?Titrate Norepi for MAP > 65 ?Repeating LA ?May need CVL ?If BP remains low and K not better may just go ahead and place HD cath  ? ?AKI w/  severe anion gap metabolic acidosis and Hyperkalemia ?-lactate suprisingly low  ?-has not been drinking or eating for several days  ?Plan ?Cont IV hydration  ?MAP gaol > 65 ?Renal dose meds ?Serial chems ?Place foley  ?CT renal stone study ordered ?Strict I&O ?May need CRRT if K not improved w/ Hydration efforts ?Nephro already consulted by EDP  ? ?Acute metabolic encephalopathy 2/2 sepsis and uremia (BUN 120s) ?Plan ?Supportive care ? ?H/o cavitary pulmonary nodule LUL ?Plan ?CT imaging ordered  ?Best Practice (right click and  "Reselect all SmartList Selections" daily)  ? ?Diet/type: NPO w/ oral meds ?DVT prophylaxis: prophylactic heparin  ?GI prophylaxis: N/A ?Lines: N/A ?Foley:  N/A ?Code Status:  full code ?Last date of multidisciplinary goals of care discussion [pending ] ? ?Labs   ?CBC: ?Recent Labs  ?Lab 04/26/21 ?0930  ?WBC 17.2*  ?HGB 12.3  ?HCT 37.4  ?MCV 90.8  ?PLT 330  ? ? ?Basic Metabolic Panel: ?Recent Labs  ?Lab 04/26/21 ?0930  ?NA 135  ?K 6.2*  ?CL 103  ?CO2 9*  ?GLUCOSE 72  ?BUN 120*  ?CREATININE 7.81*  ?CALCIUM 7.0*  ? ?GFR: ?Estimated Creatinine Clearance: 6.4 mL/min (A) (by C-G formula based on SCr of 7.81 mg/dL (H)). ?Recent Labs  ?Lab 04/26/21 ?0930  ?WBC 17.2*  ?LATICACIDVEN 1.4  ? ? ?Liver Function Tests: ?Recent Labs  ?Lab 04/26/21 ?0930  ?AST 21  ?ALT 17  ?ALKPHOS 84  ?BILITOT 0.7  ?PROT 6.3*  ?ALBUMIN 3.1*  ? ?No results for input(s): LIPASE, AMYLASE in the last 168 hours. ?No results for input(s): AMMONIA in the last 168 hours. ? ?ABG ?   ?Component Value Date/Time  ? TCO2 23 06/27/2013 1207  ?  ? ?Coagulation Profile: ?Recent Labs  ?Lab 04/26/21 ?0930  ?INR 1.3*  ? ? ?Cardiac Enzymes: ?No results for input(s): CKTOTAL, CKMB, CKMBINDEX, TROPONINI in the last 168 hours. ? ?HbA1C: ?No results found for: HGBA1C ? ?CBG: ?No results for input(s): GLUCAP in the last 168 hours. ? ?Review of Systems:   ?Per above  ? ?Past Medical History:  ?She,  has a past medical history of Allergy, Anemia, GERD (gastroesophageal reflux disease), HLA B27 (HLA B27 positive), UTI (urinary tract infection), Hyperplastic colon polyp, Hypertension, IBS (irritable bowel syndrome), and Seizures ([place]).  ? ?Surgical History:  ? ?Past Surgical History:  ?Procedure Laterality Date  ? APPENDECTOMY    ? BLADDER SURGERY  2007  ? CESAREAN SECTION    ? x 2   ? ORIF TOE FRACTURE Left 06/27/2013  ? Procedure: OPEN REDUCTION INTERNAL FIXATION (ORIF) LEFT FIFTH METATARSAL;  Surgeon: Wylene Simmer, MD;  Location: [facility];  Service:  Orthopedics;  Laterality: Left;  ? PARTIAL HYSTERECTOMY  2006  ? TONSILLECTOMY    ? TUBAL LIGATION    ?  ? ?Social History:  ? reports that she has been smoking cigarettes. She has a 30.00 pack-year smoking history. She has never used smokeless tobacco. She reports current alcohol use of about 1.0 standard drink per week. She reports that she does not use drugs.  ? ?Family History:  ?Her family history includes Breast cancer in her maternal grandmother; Diabetes in her maternal aunt; Heart disease in her father; Irritable bowel syndrome in her brother and sister. There is no history of Colon cancer.  ? ?Allergies ?Allergies  ?Allergen Reactions  ? Lamictal [Lamotrigine] Rash  ? Other   ?  Cats: Rash  ?Shrimp scampi sauce: rash  ? Tramadol   ?  Lowers seizure threshold  ?  ? ?Home Medications  ?Prior to Admission medications   ?Medication Sig Start Date End Date Taking? Authorizing Provider  ?Cholecalciferol (VITAMIN D3) 25 MCG (1000 UT) CAPS Take 1,000 Units by mouth daily.   Yes [provider]  ?gabapentin (NEURONTIN) 300 MG capsule Take 300 mg by mouth 2 (two) times daily. 02/15/21  Yes [provider]  ?glycopyrrolate (ROBINUL) 1 MG tablet Take 1 mg by mouth 2 (two) times daily.   Yes [provider]  ?hydrOXYzine (ATARAX) 25 MG tablet Take 25 mg by mouth 2 (two) times daily as needed for anxiety. 04/01/21  Yes [provider]  ?ibuprofen (ADVIL,MOTRIN) 200 MG tablet Take 600 mg by mouth every 6 (six) hours as needed for moderate pain.   Yes [provider]  ?levETIRAcetam (KEPPRA) 500 MG tablet Take 1 tablet (500 mg total) by mouth 2 (two) times daily. 10/19/12  Yes Marcial Pacas, MD  ?losartan (COZAAR) 25 MG tablet Take 25 mg by mouth every evening.    Yes [provider]  ?Turmeric (QC TUMERIC COMPLEX PO) Take 475 mg by mouth daily.   Yes [provider]  ?  ? ?Critical care time: 35 minutes ?  ?Erick Colace ACNP-BC ?[place] ?Pager # [phone] OR # [phone] if no answer ? ? ? ? ? ?

## 2021-04-26 NOTE — ED Notes (Signed)
Patient transported to CT with RN 

## 2021-04-26 NOTE — Progress Notes (Signed)
Pharmacy Antibiotic Note ? ?[name] is a 61 y.o. female admitted on 04/26/2021 with sepsis.  Pharmacy has been consulted for vancomycin/zosyn dosing. AKI - SCr 7.81 on presentation (last documented SCr was 0.5 seven years ago). ? ?Vancomycin 1g IV x 1 and cefepime 2g IV x 1 already given this AM in the ER. ? ?Plan: ?Zosyn 3.375g IV q12h (4h infusion) ?Vancomycin 500mg  IV x1 now (total 1500mg  IV loading dose with previous 1g dose currently being given); will follow renal function trend and consider levels as needed for further vancomycin dosing. Goal AUC 400-550. ?Monitor clinical progress, c/s, renal function, vancomycin levels as indicated ?F/u de-escalation plan/LOT, Nephrology plans ? ? ?Height: 5\' 4"  (162.6 cm) ?Weight: 63.5 kg (140 lb) ?IBW/kg (Calculated) : 54.7 ? ?Temp (24hrs), Avg:97.7 ?F (36.5 ?C), Min:97.7 ?F (36.5 ?C), Max:97.7 ?F (36.5 ?C) ? ?Recent Labs  ?Lab 04/26/21 ?0930  ?WBC 17.2*  ?CREATININE 7.81*  ?LATICACIDVEN 1.4  ?  ?Estimated Creatinine Clearance: 6.4 mL/min (A) (by C-G formula based on SCr of 7.81 mg/dL (H)).   ? ?Allergies  ?Allergen Reactions  ? Lamictal [Lamotrigine] Rash  ? Other   ?  Cats: Rash  ?Shrimp scampi sauce: rash  ? Tramadol   ?  Lowers seizure threshold  ? ? ? ?Arturo Morton, PharmD, BCPS ?Please check AMION for all [facility] contact numbers ?Clinical Pharmacist ?04/26/2021 12:58 PM ? ? ?

## 2021-04-26 NOTE — ED Provider Notes (Signed)
?[facility] EMERGENCY DEPARTMENT ?Provider Note ? ? ?CSN: [id number] ?Arrival date & time: 04/26/21  4098 ? ?  ? ?History ? ?Chief Complaint  ?Patient presents with  ? Weakness  ? ? ?[name] is a 61 y.o. female with a PMH significant for MS who presents with worsening weakness as well as confusion for the last 4 days. BLE wounds and swelling of right greater than left. Weeping ulcers noted. Patient does endorse some chills, denies CP, SHOB.  Patient with family that come to help her but difficulty taking care of herself at home.  She endorses some decreased fluid intake.  She has some minimal suprapubic pain.  She denies dysuria, overt abdominal pain, nausea, vomiting.  She denies previous history of kidney stones, or known kidney disease.  She reports that she had 1 glass of water yesterday but otherwise has not been working very much over the last few days.  She denies any previous history of heart failure, she reports that her leg swelling is related to recurrent infections, and there has been some pus draining from the affected extremities. ? ? ?Weakness ? ?  ? ?Home Medications ?Prior to Admission medications   ?Medication Sig Start Date End Date Taking? Authorizing Provider  ?Cholecalciferol (VITAMIN D3) 25 MCG (1000 UT) CAPS Take 1,000 Units by mouth daily.   Yes [provider]  ?gabapentin (NEURONTIN) 300 MG capsule Take 300 mg by mouth 2 (two) times daily. 02/15/21  Yes [provider]  ?glycopyrrolate (ROBINUL) 1 MG tablet Take 1 mg by mouth 2 (two) times daily.   Yes [provider]  ?hydrOXYzine (ATARAX) 25 MG tablet Take 25 mg by mouth 2 (two) times daily as needed for anxiety. 04/01/21  Yes [provider]  ?ibuprofen (ADVIL,MOTRIN) 200 MG tablet Take 600 mg by mouth every 6 (six) hours as needed for moderate pain.   Yes [provider]  ?levETIRAcetam (KEPPRA) 500 MG tablet Take 1 tablet (500 mg total) by mouth 2 (two) times daily.  10/19/12  Yes Levert Feinstein, MD  ?losartan (COZAAR) 25 MG tablet Take 25 mg by mouth every evening.    Yes [provider]  ?Turmeric (QC TUMERIC COMPLEX PO) Take 475 mg by mouth daily.   Yes [provider]  ?   ? ?Allergies    ?Lamictal [lamotrigine], Other, and Tramadol   ? ?Review of Systems   ?Review of Systems  ?Skin:  Positive for wound.  ?Neurological:  Positive for weakness.  ?All other systems reviewed and are negative. ? ?Physical Exam ?Updated Vital Signs ?BP (!) 97/45   Pulse (!) 116   Temp 97.7 ?F (36.5 ?C) (Oral)   Resp 17   Ht  (1.626 m)   Wt 63.5 kg   SpO2 97%   BMI 24.03 kg/m?  ?Physical Exam ?Vitals and nursing note reviewed.  ?Constitutional:   ?   General: She is in acute distress.  ?   Appearance: Normal appearance. She is ill-appearing and toxic-appearing.  ?   Comments: Patient in no acute distress, throughout her visit she has become more distressed secondary to pain  ?HENT:  ?   Head: Normocephalic and atraumatic.  ?   Mouth/Throat:  ?   Mouth: Mucous membranes are dry.  ?Eyes:  ?   General:     ?   Right eye: No discharge.     ?   Left eye: No discharge.  ?Cardiovascular:  ?   Rate and  Rhythm: Regular rhythm. Tachycardia present.  ?   Heart sounds: No murmur heard. ?  No friction rub. No gallop.  ?Pulmonary:  ?   Effort: Pulmonary effort is normal.  ?   Breath sounds: Normal breath sounds.  ?Abdominal:  ?   General: Bowel sounds are normal.  ?   Palpations: Abdomen is soft.  ?   Comments: Patient with some tenderness palpation of the suprapubic region without rebound, rigidity, guarding.  ?Skin: ?   General: Skin is warm and dry.  ?   Capillary Refill: Capillary refill takes less than 2 seconds.  ?   Comments: Patient with ulcers noted on bilateral lower extremities, right leg with significant 2-3+ edema, some purulent drainage, redness.  Consistent with cellulitis.  ?Neurological:  ?   Mental Status: She is alert and oriented to person, place, and time.  ?    Comments: Cranial nerves II through XII grossly intact.  Intact finger-nose.  Romberg negative, gait normal.  Alert and oriented x2. Moves all 4 limbs spontaneously, normal coordination.  No pronator drift.  Significantly decreased strength of lower extremities, 3 out of 5, upper extremities are 4 out of 5. ? ?  ?Psychiatric:     ?   Mood and Affect: Mood normal.     ?   Behavior: Behavior normal.  ? ? ?ED Results / Procedures / Treatments   ?Labs ?(all labs ordered are listed, but only abnormal results are displayed) ?Labs Reviewed  ?CBC - Abnormal; Notable for the following components:  ?    Result Value  ? WBC 17.2 (*)   ? All other components within normal limits  ?COMPREHENSIVE METABOLIC PANEL - Abnormal; Notable for the following components:  ? Potassium 6.2 (*)   ? CO2 9 (*)   ? BUN 120 (*)   ? Creatinine, Ser 7.81 (*)   ? Calcium 7.0 (*)   ? Total Protein 6.3 (*)   ? Albumin 3.1 (*)   ? GFR, Estimated 5 (*)   ? Anion gap 23 (*)   ? All other components within normal limits  ?URINALYSIS, ROUTINE W REFLEX MICROSCOPIC - Abnormal; Notable for the following components:  ? APPearance HAZY (*)   ? Specific Gravity, Urine >1.030 (*)   ? Hgb urine dipstick TRACE (*)   ? Protein, ur 30 (*)   ? All other components within normal limits  ?PROTIME-INR - Abnormal; Notable for the following components:  ? Prothrombin Time 16.4 (*)   ? INR 1.3 (*)   ? All other components within normal limits  ?URINALYSIS, MICROSCOPIC (REFLEX) - Abnormal; Notable for the following components:  ? Bacteria, UA RARE (*)   ? All other components within normal limits  ?CULTURE, BLOOD (ROUTINE X 2)  ?CULTURE, BLOOD (ROUTINE X 2)  ?URINE CULTURE  ?RESP PANEL BY RT-PCR (FLU A&B, COVID) ARPGX2  ?LACTIC ACID, PLASMA  ?APTT  ?BRAIN NATRIURETIC PEPTIDE  ?SODIUM, URINE, RANDOM  ?CREATININE, URINE, RANDOM  ?CK  ?HIV ANTIBODY (ROUTINE TESTING W REFLEX)  ?HEMOGLOBIN A1C  ?BASIC METABOLIC PANEL  ?PROCALCITONIN  ? ? ?EKG ?EKG  Interpretation ? ?Date/Time:  Monday April 26 2021 09:14:16 EDT ?Ventricular Rate:  105 ?PR Interval:  149 ?QRS Duration: 124 ?QT Interval:  363 ?QTC Calculation: 480 ?R Axis:   -8 ?Text Interpretation: Sinus tachycardia Nonspecific intraventricular conduction delay Borderline T abnormalities, anterior leads Artifact Confirmed by Blane Ohara [phone]) on 04/26/2021 10:46:34 AM ? ?Radiology ?DG Chest 2 View ? ?Result Date: 04/26/2021 ?CLINICAL DATA:  Shortness of breath and weakness. EXAM: CHEST - 2 VIEW COMPARISON:  09/18/2020 and CT chest 10/09/2020. FINDINGS: Patient is rotated. Heart size stable. Slightly thick-walled cyst in the left upper lobe, as on priors. Lingular and left lower lobe patchy/streaky airspace opacification. Right lung is clear. IMPRESSION: 1. Slightly thick-walled cystic structure in the left upper lobe, as on prior exams. Malignancy cannot be excluded. 2. Thick-walled cystic structure in the right lower lobe, seen on 10/09/2020, is poorly visualized. 3. Lingular and left lower lobe patchy/streaky airspace opacification may be due to pneumonia. Electronically Signed   By: Leanna Battles M.D.   On: 04/26/2021 09:57  ? ?CT Head Wo Contrast ? ?Result Date: 04/26/2021 ?CLINICAL DATA:  Mental status change, unknown cause EXAM: CT HEAD WITHOUT CONTRAST TECHNIQUE: Contiguous axial images were obtained from the base of the skull through the vertex without intravenous contrast. RADIATION DOSE REDUCTION: This exam was performed according to the departmental dose-optimization program which includes automated exposure control, adjustment of the mA and/or kV according to patient size and/or use of iterative reconstruction technique. COMPARISON:  MRI head February 11, 2016. FINDINGS: Brain: Benign dilated perivascular space in the right basal ganglia. Additional patchy white matter hypodensities, better characterized on prior MRI. No evidence of acute large vascular territory infarct, acute hemorrhage, mass  lesion, midline shift, or hydrocephalus. Streak artifact limits evaluation of posterior fossa. Vascular: No hyperdense vessel identified. Skull: No acute fractures. Sinuses/Orbits: Mild paranasal sinus mucosal thickening. No acute orbital findin

## 2021-04-26 NOTE — ED Triage Notes (Signed)
Via EMS from home. Family/pt reports increased weakness x 4 days. Pt normally walks with walker but unable to x 4 days. Pt has wounds to BLE x "months". Pt slightly confused. GCS 14.  ?

## 2021-04-26 NOTE — Progress Notes (Signed)
Afternoon rounds.  ? ?Interval  ?Renal fxn improved. K down to 5. Weaning oxygen.  ?Still on pressors.  ?Family updated re: iatrogenic right PTX.  ?CT imaging of LEs suggesting cellulitis but no deep tissue or bone involvement. CT chest reviewed. MRI still pending.  ? ?exam ?General resting comfortably  ?HENT NCAT, right IJ CVL dressing intact. MM a little more moist.  ?Pulm scattered rhonchi no accessory use Right CT dressing CD&I intermittent 1/7 airleak. + tidal  ?Card RRR ?Abd soft  ?Ext wrapped.  ?Neuro awake  ? ? ?Principal Problem: ?  Shock ([place]) ?Active Problems: ?  Anemia ?  GERD (gastroesophageal reflux disease) ?  HLA B27 (HLA B27 positive) ?  Seizures ([place]) ?  Pulmonary nodule ?  MS (multiple sclerosis) ([place]) ?  AKI (acute kidney injury) ([place]) ?  Septic shock ([place]) ?  Cellulitis of both lower extremities ?  High anion gap metabolic acidosis ?  Hyperkalemia ?  Acute metabolic encephalopathy ?  Pneumothorax on right ? ?Plan ?Cont current abx ?Cont IVFs ?Serial labs ?F/u MRI ?Chest tube to sxn.  ? ?Erick Colace ACNP-BC ?[place] ?Pager # [phone] OR # [phone] if no answer ? ?

## 2021-04-26 NOTE — ED Notes (Signed)
PA notified of BP 

## 2021-04-26 NOTE — Procedures (Signed)
Central Venous Catheter Insertion Procedure Note ? ?MAIDAH SHORB  ?[id number]  ?16-Nov-1960 ? ?Date:04/26/21  ?Time:4:58 PM  ? ?Provider Performing:Pete E Kary Kos  ? ?Procedure: Insertion of Non-tunneled Central Venous Catheter(36556) with US guidance [id number])  ? ?Indication(s) ?Medication administration ? ?Consent ?Risks of the procedure as well as the alternatives and risks of each were explained to the patient and/or caregiver.  Consent for the procedure was obtained and is signed in the bedside chart ? ?Anesthesia ?Topical only with 1% lidocaine  ? ?Timeout ?Verified patient identification, verified procedure, site/side was marked, verified correct patient position, special equipment/implants available, medications/allergies/relevant history reviewed, required imaging and test results available. ? ?Sterile Technique ?Maximal sterile technique including full sterile barrier drape, hand hygiene, sterile gown, sterile gloves, mask, hair covering, sterile ultrasound probe cover (if used). ? ?Procedure Description ?Area of catheter insertion was cleaned with chlorhexidine and draped in sterile fashion.  With real-time ultrasound guidance a central venous catheter was placed into the right internal jugular vein. Nonpulsatile blood flow and easy flushing noted in all ports.  The catheter was sutured in place and sterile dressing applied. ? ?Complications/Tolerance ?None; patient tolerated the procedure well. ?Chest X-ray is ordered to verify placement for internal jugular or subclavian cannulation.   Chest x-ray is not ordered for femoral cannulation. ? ?EBL ?Minimal ? ?Specimen(s) ?None ? ?Erick Colace ACNP-BC ?[place] ?Pager # [phone] OR # [phone] if no answer ? ? ?

## 2021-04-26 NOTE — ED Provider Notes (Signed)
I provided a substantive portion of the care of this patient.  I personally performed the entirety of the history, exam, and medical decision making for this encounter. ? ?EKG Interpretation ? ?Date/Time:  Monday April 26 2021 09:14:16 EDT ?Ventricular Rate:  105 ?PR Interval:  149 ?QRS Duration: 124 ?QT Interval:  363 ?QTC Calculation: 480 ?R Axis:   -8 ?Text Interpretation: Sinus tachycardia Nonspecific intraventricular conduction delay Borderline T abnormalities, anterior leads Artifact Confirmed by Blane Ohara [id number]) on 04/26/2021 10:46:34 AM ? ?.Critical Care ?Performed by: Blane Ohara, MD ?Authorized by: Blane Ohara, MD  ? ?Critical care provider statement:  ?  Critical care time (minutes):  80 ?  Critical care start time:  04/26/2021 11:00 PM ?  Critical care end time:  04/26/2021 12:20 PM ?  Critical care time was exclusive of:  Separately billable procedures and treating other patients and teaching time ?  Critical care was necessary to treat or prevent imminent or life-threatening deterioration of the following conditions:  Sepsis and shock ?  Critical care was time spent personally by me on the following activities:  Re-evaluation of patient's condition, examination of patient, ordering and review of radiographic studies, ordering and review of laboratory studies, pulse oximetry, ordering and performing treatments and interventions and discussions with consultants ? ?Septic shock (HCC) ? ?Cellulitis of right lower extremity - Plan: CT TIBIA FIBULA RIGHT WO CONTRAST, CT TIBIA FIBULA RIGHT WO CONTRAST ? ?Cellulitis of leg, right ? ?Metabolic acidosis ? ?Acute renal failure, unspecified acute renal failure type (HCC) ? ?Hypotension due to hypovolemia ? ?Hyperkalemia ? ? ?  ?Blane Ohara, MD ?04/27/21 [phone] ? ?

## 2021-04-26 NOTE — Progress Notes (Signed)
eLink Physician-Brief Progress Note ?Patient Name: [name] ?DOB: January 15, 1960 ?MRN: [id number] ? ? ?Date of Service ? 04/26/2021  ?HPI/Events of Note ? ABG on [place] O2 = [id number] c/w metabolic acidosis with respiratory compensation.    ?eICU Interventions ? Plan: ?NaHCO3 100 meq IV now.  ?NaHCO3 IV infusion to run at 100 mL/hour. ?Repeat ABG at 12 midnight.   ? ? ? ?Intervention Category ?Major Interventions: Acid-Base disturbance - evaluation and management ? ?Karene Bracken Cornelia Copa ?04/26/2021, 9:12 PM ?

## 2021-04-26 NOTE — Consult Note (Addendum)
Reason for Consult:Acute renal failure ?Referring Physician: ED provider  ? ?Chief Complaint: Weakness  ? ?Assessment/Plan: ? ?Acute renal failure  ?Cr 7.81, GFR 5. Last Cr 0.5 7 years ago; potentially due to hypoperfusion to kidneys leading to ATN vs prerenal azotemia w/ obstruction lower in the differential. Given the clinical picture more likely to be in ATN from sepsis.  ?- checking urine lytes given oliguric status; would like to look at the urine but 12ml from in and out cath just sent with no remaining urine.  ?- high likelihood of needing HD if she doesn't respond to isotonic fluid resuscitation. If she needs RRT she would need CRRT given her soft pressures.  ?- hold nephrotoxic agents and losartan as you are doing ?- if urine P/Cr ratio elevated will order serologies but this is much lower on the differential.  ?- dose meds for GFR < 15 ?- strict Is and Os  ?- f/u renal US (checking for size of kidneys and thinning of cortex which would be c/w advanced CKD); difficult to determine if she has acute on chronic kidney disease given no chemistry panels in recent years. ?- f/u renal CT stone protocol. ? ?Septic shock likely 2/2 pneumonia ?WBC 17.2. CXR with slightly thick-walled cystic structure in the left upper lobe, as ?on prior exams. Malignancy cannot be excluded. Thick-walled cystic structure in the right lower lobe, seen on ?10/09/2020, is poorly visualized. Lingular and left lower lobe patchy/streaky airspace ?opacification may be due to pneumonia. ?- s/p approx 2 L IVF  ?- f/u blood culture  ?- continue broad spec abx  ?- CCM consulted  ? ?Hyperkalemia ?K+ 6.2. Received calcium gluconate and 5u insulin.  ?- continue to monitor but low threshold to initiate dialysis w/ CRRT ? ?MS  ?Receiving Ocrevus infusions, 2 sessions in February  ? ?6. Seizure disorder ?Currently taking Keppra. Last seizure about 7 years ago. Had a fall at work, was given Tramadol and subsequently developed grand mal seizures ?   ?HPI: [name] is an 61 y.o. female with htn, MS, ankylosing spondylitis OA presenting with weakness and confusion.  ? ?History from patient limited. Called husband who stated for past 48-72 has had excruciating pain in neck and back, swollen right leg they think from [place] infusion in mid and late feb. 3-4 weeks ago starting getting scaling of lower extremities. Water comes out of pores. Husband brought her in for weakness. States she usually uses walker but has been having trouble with walking. Also has not been eating much for the last couple of days. Seems "out of it" for the past 72 hours. Thought maybe it was a stroke. Has been using Ibuprofen 600mg  a day. No other NSAID use. Has noted decreased UOP as well but denies dysuria.  ? ?Denies patient having blood work done recently.  ? ? ?Chemistry and CBC: ?Creatinine, Ser  ?Date/Time Value Ref Range Status  ?04/26/2021 09:30 AM 7.81 (H) 0.44 - 1.00 mg/dL Final  ?12/29/2013 06:58 PM 0.50 0.50 - 1.10 mg/dL Final  ?06/27/2013 12:07 PM 0.80 0.50 - 1.10 mg/dL Final  ?09/24/2012 03:37 PM 1.00 0.50 - 1.10 mg/dL Final  ?06/19/2009 02:46 PM 0.6 0.4 - 1.2 mg/dL Final  ? ?Recent Labs  ?Lab 04/26/21 ?0930  ?NA 135  ?K 6.2*  ?CL 103  ?CO2 9*  ?GLUCOSE 72  ?BUN 120*  ?CREATININE 7.81*  ?CALCIUM 7.0*  ? ?Recent Labs  ?Lab 04/26/21 ?0930  ?WBC 17.2*  ?HGB 12.3  ?HCT 37.4  ?  MCV 90.8  ?PLT 330  ? ?Liver Function Tests: ?Recent Labs  ?Lab 04/26/21 ?0930  ?AST 21  ?ALT 17  ?ALKPHOS 84  ?BILITOT 0.7  ?PROT 6.3*  ?ALBUMIN 3.1*  ? ?No results for input(s): LIPASE, AMYLASE in the last 168 hours. ?No results for input(s): AMMONIA in the last 168 hours. ?Cardiac Enzymes: ?No results for input(s): CKTOTAL, CKMB, CKMBINDEX, TROPONINI in the last 168 hours. ?Iron Studies: No results for input(s): IRON, TIBC, TRANSFERRIN, FERRITIN in the last 72 hours. ?PT/INR: ?@LABRCNTIP (inr:5) ? ?Xrays/Other Studies: ?) ?Results for orders placed or performed during the hospital encounter of  04/26/21 (from the past 48 hour(s))  ?CBC     Status: Abnormal  ? Collection Time: 04/26/21  9:30 AM  ?Result Value Ref Range  ? WBC 17.2 (H) 4.0 - 10.5 K/uL  ? RBC 4.12 3.87 - 5.11 MIL/uL  ? Hemoglobin 12.3 12.0 - 15.0 g/dL  ? HCT 37.4 36.0 - 46.0 %  ? MCV 90.8 80.0 - 100.0 fL  ? MCH 29.9 26.0 - 34.0 pg  ? MCHC 32.9 30.0 - 36.0 g/dL  ? RDW 14.8 11.5 - 15.5 %  ? Platelets 330 150 - 400 K/uL  ? nRBC 0.0 0.0 - 0.2 %  ?  Comment: Performed at [facility], [place] [address], [place], [place] [address]  ?Comprehensive metabolic panel     Status: Abnormal  ? Collection Time: 04/26/21  9:30 AM  ?Result Value Ref Range  ? Sodium 135 135 - 145 mmol/L  ? Potassium 6.2 (H) 3.5 - 5.1 mmol/L  ? Chloride 103 98 - 111 mmol/L  ? CO2 9 (L) 22 - 32 mmol/L  ? Glucose, Bld 72 70 - 99 mg/dL  ?  Comment: Glucose reference range applies only to samples taken after fasting for at least 8 hours.  ? BUN 120 (H) 8 - 23 mg/dL  ? Creatinine, Ser 7.81 (H) 0.44 - 1.00 mg/dL  ? Calcium 7.0 (L) 8.9 - 10.3 mg/dL  ? Total Protein 6.3 (L) 6.5 - 8.1 g/dL  ? Albumin 3.1 (L) 3.5 - 5.0 g/dL  ? AST 21 15 - 41 U/L  ? ALT 17 0 - 44 U/L  ? Alkaline Phosphatase 84 38 - 126 U/L  ? Total Bilirubin 0.7 0.3 - 1.2 mg/dL  ? GFR, Estimated 5 (L) >60 mL/min  ?  Comment: (NOTE) ?Calculated using the CKD-EPI Creatinine Equation (2021) ?  ? Anion gap 23 (H) 5 - 15  ?  Comment: Electrolytes repeated to confirm. ?Performed at [facility], [place] [address], [place], [place] [address] ?  ?Lactic acid, plasma     Status: None  ? Collection Time: 04/26/21  9:30 AM  ?Result Value Ref Range  ? Lactic Acid, Venous 1.4 0.5 - 1.9 mmol/L  ?  Comment: Performed at [facility], [place] [address], [place], [place] [address]  ?Protime-INR     Status: Abnormal  ? Collection Time: 04/26/21  9:30 AM  ?Result Value Ref Range  ? Prothrombin Time 16.4 (H) 11.4 - 15.2 seconds  ? INR 1.3 (H) 0.8 - 1.2  ?  Comment: (NOTE) ?INR goal varies based on device and disease  states. ?Performed at [facility], [place] [address] [place], [place] ?[address] ?  ?APTT     Status: None  ? Collection Time: 04/26/21  9:30 AM  ?Result Value Ref Range  ? aPTT 33 24 - 36 seconds  ?  Comment: Performed  at [facility], [place] [address] [place], [place] [address]  ?Brain natriuretic peptide     Status: None  ? Collection Time: 04/26/21  9:30 AM  ?Result Value Ref Range  ? B Natriuretic Peptide 49.3 0.0 - 100.0 pg/mL  ?  Comment: Performed at [facility], [place] [address], [place], [place] [address]  ? ?DG Chest 2 View ? ?Result Date: 04/26/2021 ?CLINICAL DATA:  Shortness of breath and weakness. EXAM: CHEST - 2 VIEW COMPARISON:  09/18/2020 and CT chest 10/09/2020. FINDINGS: Patient is rotated. Heart size stable. Slightly thick-walled cyst in the left upper lobe, as on priors. Lingular and left lower lobe patchy/streaky airspace opacification. Right lung is clear. IMPRESSION: 1. Slightly thick-walled cystic structure in the left upper lobe, as on prior exams. Malignancy cannot be excluded. 2. Thick-walled cystic structure in the right lower lobe, seen on 10/09/2020, is poorly visualized. 3. Lingular and left lower lobe patchy/streaky airspace opacification may be due to pneumonia. Electronically Signed   By: Lorin Picket M.D.   On: 04/26/2021 09:57  ? ?CT Head Wo Contrast ? ?Result Date: 04/26/2021 ?CLINICAL DATA:  Mental status change, unknown cause EXAM: CT HEAD WITHOUT CONTRAST TECHNIQUE: Contiguous axial images were obtained from the base of the skull through the vertex without intravenous contrast. RADIATION DOSE REDUCTION: This exam was performed according to the departmental dose-optimization program which includes automated exposure control, adjustment of the mA and/or kV according to patient size and/or use of iterative reconstruction technique. COMPARISON:  MRI head February 11, 2016. FINDINGS: Brain: Benign dilated perivascular space in the right basal ganglia. Additional  patchy white matter hypodensities, better characterized on prior MRI. No evidence of acute large vascular territory infarct, acute hemorrhage, mass lesion, midline shift, or hydrocephalus. Streak artifact limits evaluation of

## 2021-04-27 ENCOUNTER — Inpatient Hospital Stay (HOSPITAL_COMMUNITY): Payer: Medicare Other

## 2021-04-27 DIAGNOSIS — R579 Shock, unspecified: Secondary | ICD-10-CM | POA: Diagnosis not present

## 2021-04-27 LAB — POCT I-STAT 7, (LYTES, BLD GAS, ICA,H+H)
Acid-base deficit: 3 mmol/L — ABNORMAL HIGH (ref 0.0–2.0)
Acid-base deficit: 3 mmol/L — ABNORMAL HIGH (ref 0.0–2.0)
Bicarbonate: 20.9 mmol/L (ref 20.0–28.0)
Bicarbonate: 20.4 mmol/L (ref 20.0–28.0)
Calcium, Ion: 1.02 mmol/L — ABNORMAL LOW (ref 1.15–1.40)
Calcium, Ion: 1.03 mmol/L — ABNORMAL LOW (ref 1.15–1.40)
HCT: 27 % — ABNORMAL LOW (ref 36.0–46.0)
HCT: 30 % — ABNORMAL LOW (ref 36.0–46.0)
Hemoglobin: 9.2 g/dL — ABNORMAL LOW (ref 12.0–15.0)
Hemoglobin: 10.2 g/dL — ABNORMAL LOW (ref 12.0–15.0)
O2 Saturation: 92 %
O2 Saturation: 92 %
Patient temperature: 98
Patient temperature: 98
Potassium: 3.7 mmol/L (ref 3.5–5.1)
Potassium: 3.8 mmol/L (ref 3.5–5.1)
Sodium: 134 mmol/L — ABNORMAL LOW (ref 135–145)
Sodium: 134 mmol/L — ABNORMAL LOW (ref 135–145)
TCO2: 22 mmol/L (ref 22–32)
TCO2: 21 mmol/L — ABNORMAL LOW (ref 22–32)
pCO2 arterial: 31.4 mmHg — ABNORMAL LOW (ref 32–48)
pCO2 arterial: 30.4 mmHg — ABNORMAL LOW (ref 32–48)
pH, Arterial: 7.429 (ref 7.35–7.45)
pH, Arterial: 7.432 (ref 7.35–7.45)
pO2, Arterial: 60 mmHg — ABNORMAL LOW (ref 83–108)
pO2, Arterial: 59 mmHg — ABNORMAL LOW (ref 83–108)

## 2021-04-27 LAB — CBC
HCT: 29.4 % — ABNORMAL LOW (ref 36.0–46.0)
Hemoglobin: 10.6 g/dL — ABNORMAL LOW (ref 12.0–15.0)
MCH: 29.8 pg (ref 26.0–34.0)
MCHC: 36.1 g/dL — ABNORMAL HIGH (ref 30.0–36.0)
MCV: 82.6 fL (ref 80.0–100.0)
Platelets: 301 10*3/uL (ref 150–400)
RBC: 3.56 MIL/uL — ABNORMAL LOW (ref 3.87–5.11)
RDW: 14.4 % (ref 11.5–15.5)
WBC: 15.6 10*3/uL — ABNORMAL HIGH (ref 4.0–10.5)
nRBC: 0 % (ref 0.0–0.2)

## 2021-04-27 LAB — GLUCOSE, CAPILLARY
Glucose-Capillary: 211 mg/dL — ABNORMAL HIGH (ref 70–99)
Glucose-Capillary: 161 mg/dL — ABNORMAL HIGH (ref 70–99)
Glucose-Capillary: 176 mg/dL — ABNORMAL HIGH (ref 70–99)
Glucose-Capillary: 157 mg/dL — ABNORMAL HIGH (ref 70–99)
Glucose-Capillary: 124 mg/dL — ABNORMAL HIGH (ref 70–99)
Glucose-Capillary: 140 mg/dL — ABNORMAL HIGH (ref 70–99)

## 2021-04-27 LAB — BASIC METABOLIC PANEL
Anion gap: 16 — ABNORMAL HIGH (ref 5–15)
Anion gap: 13 (ref 5–15)
BUN: 78 mg/dL — ABNORMAL HIGH (ref 8–23)
BUN: 73 mg/dL — ABNORMAL HIGH (ref 8–23)
CO2: 19 mmol/L — ABNORMAL LOW (ref 22–32)
CO2: 20 mmol/L — ABNORMAL LOW (ref 22–32)
Calcium: 7.1 mg/dL — ABNORMAL LOW (ref 8.9–10.3)
Calcium: 7.4 mg/dL — ABNORMAL LOW (ref 8.9–10.3)
Chloride: 99 mmol/L (ref 98–111)
Chloride: 103 mmol/L (ref 98–111)
Creatinine, Ser: 2.84 mg/dL — ABNORMAL HIGH (ref 0.44–1.00)
Creatinine, Ser: 2.12 mg/dL — ABNORMAL HIGH (ref 0.44–1.00)
GFR, Estimated: 18 mL/min — ABNORMAL LOW (ref >60)
GFR, Estimated: 26 mL/min — ABNORMAL LOW (ref >60)
Glucose, Bld: 151 mg/dL — ABNORMAL HIGH (ref 70–99)
Glucose, Bld: 151 mg/dL — ABNORMAL HIGH (ref 70–99)
Potassium: 3.9 mmol/L (ref 3.5–5.1)
Potassium: 3.8 mmol/L (ref 3.5–5.1)
Sodium: 134 mmol/L — ABNORMAL LOW (ref 135–145)
Sodium: 136 mmol/L (ref 135–145)

## 2021-04-27 LAB — HEPATIC FUNCTION PANEL
ALT: 17 U/L (ref 0–44)
AST: 31 U/L (ref 15–41)
Albumin: 2.2 g/dL — ABNORMAL LOW (ref 3.5–5.0)
Alkaline Phosphatase: 65 U/L (ref 38–126)
Bilirubin, Direct: 0.1 mg/dL (ref 0.0–0.2)
Indirect Bilirubin: 0.4 mg/dL (ref 0.3–0.9)
Total Bilirubin: 0.5 mg/dL (ref 0.3–1.2)
Total Protein: 4.8 g/dL — ABNORMAL LOW (ref 6.5–8.1)

## 2021-04-27 LAB — PHOSPHORUS: Phosphorus: 5.8 mg/dL — ABNORMAL HIGH (ref 2.5–4.6)

## 2021-04-27 LAB — MAGNESIUM: Magnesium: 1.8 mg/dL (ref 1.7–2.4)

## 2021-04-27 LAB — PROCALCITONIN: Procalcitonin: 0.89 ng/mL

## 2021-04-27 MED ORDER — CALCIUM GLUCONATE-NACL 2-0.675 GM/100ML-% IV SOLN
2.0000 g | Freq: Once | INTRAVENOUS | Status: AC
  Administered 2021-04-27: 2000 mg via INTRAVENOUS
  Filled 2021-04-27: qty 100

## 2021-04-27 MED ORDER — PIPERACILLIN-TAZOBACTAM 3.375 G IVPB
3.3750 g | Freq: Three times a day (TID) | INTRAVENOUS | Status: DC
Start: 2021-04-27 — End: 2021-04-29
  Administered 2021-04-27 – 2021-04-29 (×5): 3.375 g via INTRAVENOUS
  Filled 2021-04-27 (×6): qty 50

## 2021-04-27 MED ORDER — INSULIN ASPART 100 UNIT/ML IJ SOLN
0.0000 [IU] | INTRAMUSCULAR | Status: DC
  Administered 2021-04-27: 2 [IU] via SUBCUTANEOUS
  Administered 2021-04-27 (×2): 3 [IU] via SUBCUTANEOUS
  Administered 2021-04-27 – 2021-04-28 (×2): 2 [IU] via SUBCUTANEOUS
  Administered 2021-04-28 – 2021-04-29 (×2): 3 [IU] via SUBCUTANEOUS
  Administered 2021-04-29 (×2): 2 [IU] via SUBCUTANEOUS
  Administered 2021-04-29 (×2): 3 [IU] via SUBCUTANEOUS
  Administered 2021-04-30 – 2021-05-02 (×4): 2 [IU] via SUBCUTANEOUS
  Administered 2021-05-02: 3 [IU] via SUBCUTANEOUS
  Administered 2021-05-02 – 2021-05-03 (×6): 2 [IU] via SUBCUTANEOUS
  Administered 2021-05-04 (×2): 3 [IU] via SUBCUTANEOUS
  Administered 2021-05-04: 2 [IU] via SUBCUTANEOUS
  Administered 2021-05-04: 3 [IU] via SUBCUTANEOUS
  Administered 2021-05-05 (×2): 2 [IU] via SUBCUTANEOUS
  Administered 2021-05-05: 3 [IU] via SUBCUTANEOUS
  Administered 2021-05-05: 2 [IU] via SUBCUTANEOUS
  Administered 2021-05-05: 3 [IU] via SUBCUTANEOUS
  Administered 2021-05-05 – 2021-05-06 (×2): 2 [IU] via SUBCUTANEOUS
  Administered 2021-05-06: 3 [IU] via SUBCUTANEOUS
  Administered 2021-05-06 – 2021-05-07 (×3): 2 [IU] via SUBCUTANEOUS
  Administered 2021-05-07: 3 [IU] via SUBCUTANEOUS
  Administered 2021-05-08 (×2): 2 [IU] via SUBCUTANEOUS
  Administered 2021-05-08 (×2): 3 [IU] via SUBCUTANEOUS
  Administered 2021-05-09: 2 [IU] via SUBCUTANEOUS
  Administered 2021-05-09: 3 [IU] via SUBCUTANEOUS
  Administered 2021-05-09 – 2021-05-13 (×11): 2 [IU] via SUBCUTANEOUS

## 2021-04-27 MED ORDER — GLYCOPYRROLATE 1 MG PO TABS
1.0000 mg | ORAL_TABLET | Freq: Two times a day (BID) | ORAL | Status: DC
  Administered 2021-04-27 (×2): 1 mg via ORAL
  Filled 2021-04-27 (×2): qty 1

## 2021-04-27 MED ORDER — LEVETIRACETAM 500 MG PO TABS
500.0000 mg | ORAL_TABLET | Freq: Two times a day (BID) | ORAL | Status: DC
  Administered 2021-04-27 (×2): 500 mg via ORAL
  Filled 2021-04-27 (×3): qty 1

## 2021-04-27 MED ORDER — GUAIFENESIN 100 MG/5ML PO LIQD
10.0000 mL | Freq: Four times a day (QID) | ORAL | Status: DC | PRN
  Administered 2021-04-29: 10 mL via ORAL
  Filled 2021-04-27: qty 10

## 2021-04-27 MED ORDER — IPRATROPIUM-ALBUTEROL 0.5-2.5 (3) MG/3ML IN SOLN
3.0000 mL | Freq: Four times a day (QID) | RESPIRATORY_TRACT | Status: DC
  Administered 2021-04-27 – 2021-05-05 (×33): 3 mL via RESPIRATORY_TRACT
  Filled 2021-04-27 (×33): qty 3

## 2021-04-27 MED ORDER — GABAPENTIN 300 MG PO CAPS: 600.0000 mg | ORAL_CAPSULE | Freq: Every day | ORAL | Status: DC

## 2021-04-27 MED ORDER — GABAPENTIN 300 MG PO CAPS
300.0000 mg | ORAL_CAPSULE | Freq: Every day | ORAL | Status: DC
  Administered 2021-04-27: 300 mg via ORAL
  Filled 2021-04-27: qty 1

## 2021-04-27 MED ORDER — GABAPENTIN 300 MG PO CAPS
300.0000 mg | ORAL_CAPSULE | Freq: Two times a day (BID) | ORAL | Status: DC
Start: 2021-04-27 — End: 2021-04-28
  Administered 2021-04-27: 300 mg via ORAL
  Filled 2021-04-27: qty 1

## 2021-04-27 NOTE — Procedures (Signed)
Arterial Catheter Insertion Procedure Note ? ?[name]  ?DI:[id number]  ?1960-10-22 ? ?Date:04/27/21  ?Time:2:43 PM  ? ? ?Provider Performing: Idamae Schuller  ? ? ?Procedure: Insertion of Arterial Line [id number]) without US guidance ? ?Indication(s) ?Blood pressure monitoring and/or need for frequent ABGs ? ?Consent ?Risks of the procedure as well as the alternatives and risks of each were explained to the patient and/or caregiver.  Consent for the procedure was obtained and is signed in the bedside chart ? ?Anesthesia ?None ? ? ?Time Out ?Verified patient identification, verified procedure, site/side was marked, verified correct patient position, special equipment/implants available, medications/allergies/relevant history reviewed, required imaging and test results available. ? ? ?Sterile Technique ?Maximal sterile technique including full sterile barrier drape, hand hygiene, sterile gown, sterile gloves, mask, hair covering, sterile ultrasound probe cover (if used). ? ? ?Procedure Description ?Area of catheter insertion was cleaned with chlorhexidine and draped in sterile fashion. Without real-time ultrasound guidance an arterial catheter was placed into the left radial artery.  Appropriate arterial tracings confirmed on monitor.   ? ? ?Complications/Tolerance ?None; patient tolerated the procedure well. ? ? ?EBL ?Minimal ? ? ?Specimen(s) ?None ? ?

## 2021-04-27 NOTE — TOC Progression Note (Signed)
Transition of Care (TOC) - Progression Note  ? ? ?Patient Details  ?Name: [name] ?MRN: [id number] ?Date of Birth: 09-22-1960 ? ?Transition of Care (TOC) CM/SW Contact  ?Beckie Busing, RN ?Phone [phone] ? ?04/27/2021, 3:35 PM ? ?Clinical Narrative:    ? ?Transition of Care (TOC) Screening Note ? ? ?Patient Details  ?Name: [name] ?Date of Birth: [birth year] ? ? ?Transition of Care (TOC) CM/SW Contact:    ?Beckie Busing, RN ?Phone Number: ?04/27/2021, 3:35 PM ? ? ? ?Transition of Care Department [facility]) has reviewed patient and no TOC needs have been identified at this time. We will continue to monitor patient advancement through interdisciplinary progression rounds. If new patient transition needs arise, please place a TOC consult. ? ? ? ? ?  ?  ? ?Expected Discharge Plan and Services ?  ?  ?  ?  ?  ?                ?  ?  ?  ?  ?  ?  ?  ?  ?  ?  ? ? ?Social Determinants of Health (SDOH) Interventions ?  ? ?Readmission Risk Interventions ?   ? View : No data to display.  ?  ?  ?  ? ? ?

## 2021-04-27 NOTE — Progress Notes (Addendum)
?[facility] ?Progress Note  ? ? ? ?Assessment/ Plan:   ? ?Acute renal failure likely 2/2 sepsis, improving   ?Difficult to determine if she has acute on chronic kidney disease given no chemistry panels in recent years. ?Cr 2.84 down from 7.81 yesterday, GFR 18. (Last Cr 0.5 7 years ago), Phos 5.8, K+ 3.9 ?S/p 5.2L fluids  ?UA yesterday without infection, does show dehydration. Urine creat normal 154, urine Na 16 ?Renal US yesterday without hydronephrosis. R kidney 10.5 x 4.4 x 4.4 cm = volume: 108 mL, L kidney 10.1 x 3.9 x 3.9 cm = volume: 19.9 mL. Possible 4mm renal calculus  ?CT renal study with bilateral renal simple cysts  ?2.5L UOP yesterday  ?- hold nephrotoxic agents and losartan as you are doing; can always challenge with losartan as outpatient. ?- minimal proteinuria on u/a; no need to for serologies as much lower on the differential and already has reason for AKI. Fortunately renal function improving.  ? ?- Will sign off at this time; please reconsult as needed. ? ?Septic shock likely 2/2 pneumonia ?In ICU requiring pressors and continued on broad spec abx  ?- CCM following  ? ?R Pneumothorax, iatrogenic  ?Chest tube to suction, managed by CCM  ?  ?Hyperkalemia ?K+ 6.2. Received calcium gluconate and 5u insulin.  ?- continue to monitor but low threshold to initiate dialysis w/ CRRT ?  ?MS  ?Receiving Ocrevus infusions, 2 sessions in February  ?  ?Seizure disorder ?Currently taking Keppra. Last seizure about 7 years ago. Had a fall at work, was given Tramadol and subsequently developed grand mal seizures ? ?Subjective:   ? ?Patient seemed to have responded well to fluids yesterday as kidney function is improving. CXR in the ER showed pneumothorax, chest tube placed.  ? ?This morning patient looks improved, is alert and conversational. Just had LE wrapped. Denies any particular questions or concerns  ?  ? ?Objective:   ?BP 98/74   Pulse 81   Temp 98 ?F (36.7 ?C) (Oral)   Resp 18   Ht 5'  4" (1.626 m)   Wt 68.8 kg   SpO2 92%   BMI 26.04 kg/m?  ? ?Intake/Output Summary (Last 24 hours) at 04/27/2021 1054 ?Last data filed at 04/27/2021 1000 ?Gross per 24 hour  ?Intake 5815.07 ml  ?Output 3100 ml  ?Net 2715.07 ml  ? ?Weight change:  ? ?Physical Exam: ? ?General: alert, pleasant, NAD ?CV: RRR no murmurs ?Resp: diffuse crackles bilaterally. Normal WOB on 12L [place]  ?GI: soft, non tender, non distended  ?Extremities: LE wrapped in bandages without drainage  ? ?Imaging: ?DG Chest 2 View ? ?Result Date: 04/26/2021 ?CLINICAL DATA:  Shortness of breath and weakness. EXAM: CHEST - 2 VIEW COMPARISON:  09/18/2020 and CT chest 10/09/2020. FINDINGS: Patient is rotated. Heart size stable. Slightly thick-walled cyst in the left upper lobe, as on priors. Lingular and left lower lobe patchy/streaky airspace opacification. Right lung is clear. IMPRESSION: 1. Slightly thick-walled cystic structure in the left upper lobe, as on prior exams. Malignancy cannot be excluded. 2. Thick-walled cystic structure in the right lower lobe, seen on 10/09/2020, is poorly visualized. 3. Lingular and left lower lobe patchy/streaky airspace opacification may be due to pneumonia. Electronically Signed   By: Leanna Battles M.D.   On: 04/26/2021 09:57  ? ?CT Head Wo Contrast ? ?Result Date: 04/26/2021 ?CLINICAL DATA:  Mental status change, unknown cause EXAM: CT HEAD WITHOUT CONTRAST TECHNIQUE: Contiguous axial images were obtained from the  base of the skull through the vertex without intravenous contrast. RADIATION DOSE REDUCTION: This exam was performed according to the departmental dose-optimization program which includes automated exposure control, adjustment of the mA and/or kV according to patient size and/or use of iterative reconstruction technique. COMPARISON:  MRI head February 11, 2016. FINDINGS: Brain: Benign dilated perivascular space in the right basal ganglia. Additional patchy white matter hypodensities, better characterized on  prior MRI. No evidence of acute large vascular territory infarct, acute hemorrhage, mass lesion, midline shift, or hydrocephalus. Streak artifact limits evaluation of posterior fossa. Vascular: No hyperdense vessel identified. Skull: No acute fractures. Sinuses/Orbits: Mild paranasal sinus mucosal thickening. No acute orbital findings Other: No mastoid effusions. IMPRESSION: 1. No evidence of acute intracranial abnormality by CT. 2. Patchy white matter hypodensities, better characterized on prior MRI. If there is concern for progressive or active demyelination, recommend MRI with contrast for further evaluation. Electronically Signed   By: Feliberto Harts M.D.   On: 04/26/2021 10:09  ? ?CT CHEST WO CONTRAST ? ?Result Date: 04/26/2021 ?CLINICAL DATA:  Septic shock. Immunocompromised. History of cavitary lung lesion. Acute renal failure. EXAM: CT CHEST, ABDOMEN AND PELVIS WITHOUT CONTRAST TECHNIQUE: Multidetector CT imaging of the chest, abdomen and pelvis was performed following the standard protocol without IV contrast. RADIATION DOSE REDUCTION: This exam was performed according to the departmental dose-optimization program which includes automated exposure control, adjustment of the mA and/or kV according to patient size and/or use of iterative reconstruction technique. COMPARISON:  CT chest dated October 09, 2020. CT abdomen pelvis dated June 11, 2015. FINDINGS: CT CHEST FINDINGS Cardiovascular: No significant vascular findings. Normal heart size. No pericardial effusion. No thoracic aortic aneurysm. Coronary, aortic arch, and branch vessel atherosclerotic vascular disease. Mediastinum/Nodes: No enlarged mediastinal, hilar, or axillary lymph nodes. Trachea and esophagus demonstrate no significant findings. Unchanged hypodense nodules in the thyroid gland measuring up to 1.2 cm. Not clinically significant; no follow-up imaging recommended Lungs/Pleura: Moderate centrilobular emphysema again noted. Left upper lobe  cavitary lung nodule currently measures 2.6 x 2.0 cm (series 4, image 56), previously 2.9 x 2.4 cm. Subjacent pulmonary nodule currently measures 7 x 5 mm, previously 10 x 7 mm. New 7 x 4 mm subpleural nodule in the posterior lingula (series 4, image 86). Small right lower lobe cavitary nodule currently measures 7 x 5 mm (series 4, image 105), previously 15 x 7 mm. New mild ill-defined reticular and centrilobular nodularity in the lingula and left lower lobe. New mild smooth interlobular septal thickening in the right lower lobe. Mild left greater than right dependent subsegmental atelectasis in both posterior lower lobes. No pleural effusion or pneumothorax. Musculoskeletal: No acute or significant osseous findings. CT ABDOMEN PELVIS FINDINGS Hepatobiliary: No focal liver abnormality is seen. No gallstones, gallbladder wall thickening, or biliary dilatation. Pancreas: Unremarkable. No pancreatic ductal dilatation or surrounding inflammatory changes. Spleen: Normal in size without focal abnormality. Adrenals/Urinary Tract: The adrenal glands are unremarkable. Bilateral renal simple cysts again noted. No follow-up imaging is recommended. No renal calculi or hydronephrosis. No perinephric fat stranding. The bladder is decompressed by Foley catheter. Stomach/Bowel: Stomach is within normal limits. History of prior appendectomy. No evidence of bowel wall thickening, distention, or inflammatory changes. Vascular/Lymphatic: Aortic atherosclerosis. No enlarged abdominal or pelvic lymph nodes. Reproductive: Status post hysterectomy. No adnexal masses. Other: No abdominal wall hernia or abnormality. No abdominopelvic ascites. No pneumoperitoneum. Musculoskeletal: No acute or significant osseous findings. IMPRESSION: Chest: 1. Slight interval decrease in size of left upper and right lower  lobe cavitary nodules, consistent with infectious or inflammatory etiology. 2. New mild ill-defined reticular and centrilobular nodularity  in the lingula and left lower lobe, concerning for atypical infection. 3. New mild smooth interlobular septal thickening in the right lower lobe, suggestive of mild asymmetric interstitial pulmonary edema. Ab

## 2021-04-27 NOTE — Progress Notes (Signed)
[facility] ? ?Came back to see [name] for increasing oxygen needs, confusion ?Remains on levophed and vasopressin, improved compared to yesterday ? ?On exam: ?Gen: speaking in full sentences, increased work of breathing, no accessory muscle use, weak cough ?Pulm: crackles bases, wheezing upper lobes ?CV: RRR, no mgr ?Neuro: awake, confused, conversant but wandering attention, MAEW ? ? ?Place a-line, check abg ?Add guaifenesin ?Add flutter valve ?Repeat CXR, is pneumothorax getting bigger? ?Continue chest tube to suction ?Check BNP ?Check echo ?Check CVP > 6 ?Needs continued close monitoring in ICU setting, high risk for intubation ? ?Updated husband bedside ? ?Additional cc time 35 minutes ? ?[name], MD ?[facility] ?Pager: [phone] ?Cell: [phone] ?After 7:00 pm call Elink  [phone] ? ?

## 2021-04-27 NOTE — Progress Notes (Addendum)
eLink Physician-Brief Progress Note ?Patient Name: [name] ?DOB: 21-Feb-1960 ?MRN: [id number] ? ? ?Date of Service ? 04/27/2021  ?HPI/Events of Note ? Multiple issues: 1. ABG on [place] O2 7.336/28.4/54/15.2. Patient is currently on a NaHCO3 IV infuson at 100 mL/hour. 2. Hypocalcemia - Ionized Ca++ = 0.93  ?eICU Interventions ? Plan: ?Continue present management.  ?Will replace Ca++.  ? ? ? ?Intervention Category ?Major Interventions: Acid-Base disturbance - evaluation and management ? ?[name] ?04/27/2021, 12:05 AM ?

## 2021-04-27 NOTE — Progress Notes (Addendum)
? ?NAME:  [name], MRN:  [id number], DOB:  Jun 08, 1960, LOS: 1 ?ADMISSION DATE:  04/26/2021, CONSULTATION DATE:  04/17 ?REFERRING MD:  EDP, CHIEF COMPLAINT:  Septic shock  ? ?History of Present Illness:  ?This is a 29 yof w/ h/o MS (walks w/ walker at baseline). She started being treated w/ Ocrelizumab back in Feb. Per pt and family about 4 weeks after began to notice increased RLE swelling, scaling and weakness. She attributed this to an expected side effects of the medication. Then about 5-7d prior to presentation she developed increased weakness, worsening right leg pain, poor PO intake, and neck pain. These symptoms continued to worsen to point pain was no longer bearable and she was so weak that she was no longer able to get OOB.  ?  ?In ER she was  hypotensive in 70s, tachycardic, encephalopathic, wbc ct 17.2, CO2 9 w/ + anion gap metabolic acidosis, BUN 308, creatinine 7.81 and K 6.2 ?Pertinent  Medical History  ?MS walks w/ walker, GERD, HLA B27 positive, Pulm nodule, seizure, sacroiliitis, ankylosing spondylitis, fibromyalgia ?Significant Hospital Events: ?Including procedures, antibiotic start and stop dates in addition to other pertinent events   ?4/17 admitted w/ septic shock working dx cellulitis (RLE), Cultures sent, MRI ordered C-spine, CT lower ext ordered, Zosyn and Vanc started  ?Interim History / Subjective:  ?States no acute concerns. Coughing is bothersome. Appears overall anxious. ?Review of Systems:   ?Negative unless stated in the subjective.  ?Objective   ?Blood pressure (!) 115/46, pulse 71, temperature 98.7 ?F (37.1 ?C), temperature source Axillary, resp. rate 18, height 5' 4"  (1.626 m), weight 68.8 kg, SpO2 (!) 89 %. ?CVP:  [6 mmHg-12 mmHg] 6 mmHg  ?   ? ?Intake/Output Summary (Last 24 hours) at 04/27/2021 0716 ?Last data filed at 04/27/2021 0600 ?Gross per 24 hour  ?Intake 5187.83 ml  ?Output 2760 ml  ?Net 2427.83 ml  ? ?Filed Weights  ? 04/26/21 0917 04/27/21 0446  ?Weight: 63.5 kg  68.8 kg  ? ?Examination: ?General: NAD ?HENT: NCAT, on [place] ?Lungs: CTAB, chest tube in place air bubbles present ?Cardiovascular: NSR, 2+ pulses, slight LE edema ?Abdomen: soft, normal bowel sounds ?Extremities: right leg bigger then left, right leg skin more thickened ?Neuro: alert and oriented x4 ?GU: foley in place with clear output ?Consults  ?Nephrology ?Resolved Hospital Problem list   ?Hyperkalemia s/p IVF and insulin ?Acute metabolic encephalopathy after renal fxn improvement ?Assessment & Plan:  ?Septic shock 2/2 bilateral Lower extremity Cellulitis  ?Resolving. Patient is immunocompromised from both h/o being HLA B27 positive And s/p Ocrelizumab. CT LE show cellulitis. Skin lesion consistent with side effects of Ocrelizumab which may be have been underlying cause for the cellulitis. Vanc and zosyn started. LA normal. No neck pain so less likely concern for cervical spinal infection. Patient also has hx of sacroiliitis and ankylosing spondylitis. She is s/p 5 L and will continue to monitor her fluid status. Blood cultures ordered and pending; NGTD. Currently on levo and vasopressin and hydrocortisone 100 mg BID.   ?-Continue abx ?-Follow Bcx and Ucx. ?-Titrate Norepi for MAP > 65 ? ?AKI w/ anion gap metabolic acidosis  ?Improving. Baseline around 0.6 in 08/2020. Endorsed poor oral  US renal shows no hydronephrosis. Appears to be pre-renal AKI improving with IVF. CT renal stone study ordered, no acute findings. Nephro already consulted by EDP. Appreciate their recommendation. MAP gaol > 65, ?Plan ?-CTM renal fxn ?-Renal dose meds ?-Strict I&O ? ?Iatrogenic Pneumothorax ?CXR showed  right pneumothorax. Had varying oxygen requirement from Bipap to 2L [place], to 13 L HFNC. Chest tube placed and output around 300 cc since yesterday. Will increase suction on chest tube given interval increase in pneumothorax.  ?-Will continue to monitor and wean as able of supplemental oxygen.  ? ?Multiple Sclerosis ?Follows with  [employer]. Recently started infusion of Ocrelizumab on 02/2021.  ?-Continue home rubinul.  ? ?Tobacco use disorder ?COPD ?Has 30 pack smoking hx. Will offer nicotene patch and counsel on smoking cessation. Will schedule duo-nebs.  ?-CTM ? ?H/o cavitary pulmonary nodule LUL ?CT showed slight interval decrease in left upper and right lower lobe cavitary nodules consistent with infectious or inflammatory process. New ill defined reticular and centrolobar nodularity in ligula and left lower lobe concerning for atypical infection. New mild smooth interlobar septal thickening in the right lower lobe, suggestive of mild asymmetric interstitial pulmonary edema. Has chronic pulmonary nodules and hx of emphysema 2/2 to tobacco use disorder.  ?-Will recommend outpatient follow up ? ?Hyperglycemia  ?2/2 to steroids. SSI with moderate scale. Goal 140-180. CTM ?Best Practice (right click and "Reselect all SmartList Selections" daily)  ?Diet/type: renal diet ?DVT prophylaxis: prophylactic heparin  ?GI prophylaxis: N/A ?Lines: N/A ?Foley:  Yes ?Continuous: Vasopressin 0.04, Levophed 12 ?Code Status:  full code ?Last date of multidisciplinary goals of care discussion [04/18 with husband] ?Labs   ?Initial CBC showed leukocytosis at 17k which is improving. CMP showed hyperkalemia at 6.2 and creatinine at 7.8 with BUN at 120. Both improved. K 3.9, Creatinine 2.8 and BUN 78. Slight hypoprotenemia and hypoalbumenia present. LA normal. Bcx and Ucx pending. BNP normal. UA showed increased specific gravity. Negative resp panel. A1c 5.0. Initial procal wnl, repeat pending. CK elevated at 941. Calcium low intermittently at 6.1. ABG overnight showed pH at 7.2, PCO2 26.2, PO 71, ionized calcium 1.00. Bicarb and calcium given overnight. Repeat ABG showed pH of 7.3. ?CBC: ?Recent Labs  ?Lab 04/26/21 ?0930 04/26/21 ?2048 04/26/21 ?2333 04/27/21 ?0543  ?WBC 17.2*  --   --  15.6*  ?HGB 12.3 10.2* 9.9* 10.6*  ?HCT 37.4 30.0* 29.0*  29.4*  ?MCV 90.8  --   --  82.6  ?PLT 330  --   --  301  ? ?Basic Metabolic Panel: ?Recent Labs  ?Lab 04/26/21 ?0930 04/26/21 ?1548 04/26/21 ?2048 04/26/21 ?2333 04/27/21 ?0543  ?NA 135 138 134* 135 134*  ?K 6.2* 5.0 4.9 4.3 3.9  ?CL 103 114*  --   --  99  ?CO2 9* 10*  --   --  19*  ?GLUCOSE 72 159*  --   --  151*  ?BUN 120* 99*  --   --  78*  ?CREATININE 7.81* 4.71*  --   --  2.84*  ?CALCIUM 7.0* 6.1*  --   --  7.1*  ?MG  --   --   --   --  1.8  ? ?GFR: ?Estimated Creatinine Clearance: 19.3 mL/min (A) (by C-G formula based on SCr of 2.84 mg/dL (H)). ?Recent Labs  ?Lab 04/26/21 ?0930 04/26/21 ?1307 04/27/21 ?0543  ?PROCALCITON  --  0.78  --   ?WBC 17.2*  --  15.6*  ?LATICACIDVEN 1.4  --   --   ? ?Liver Function Tests: ?Recent Labs  ?Lab 04/26/21 ?0930 04/27/21 ?0543  ?AST 21 31  ?ALT 17 17  ?ALKPHOS 84 65  ?BILITOT 0.7 0.5  ?PROT 6.3* 4.8*  ?ALBUMIN 3.1* 2.2*  ? ?No results for input(s): LIPASE, AMYLASE in  the last 168 hours. ?No results for input(s): AMMONIA in the last 168 hours. ?ABG ?   ?Component Value Date/Time  ? PHART 7.336 (L) 04/26/2021 2333  ? PCO2ART 28.4 (L) 04/26/2021 2333  ? PO2ART 54 (L) 04/26/2021 2333  ? HCO3 15.2 (L) 04/26/2021 2333  ? TCO2 16 (L) 04/26/2021 2333  ? ACIDBASEDEF 9.0 (H) 04/26/2021 2333  ? O2SAT 86 04/26/2021 2333  ?  ?Coagulation Profile: ?Recent Labs  ?Lab 04/26/21 ?0930  ?INR 1.3*  ? ?Cardiac Enzymes: ?Recent Labs  ?Lab 04/26/21 ?1547  ?CKTOTAL 941*  ? ?HbA1C: ?Hgb A1c MFr Bld  ?Date/Time Value Ref Range Status  ?04/26/2021 01:07 PM 5.0 4.8 - 5.6 % Final  ?  Comment:  ?  (NOTE) ?Pre diabetes:          5.7%-6.4% ? ?Diabetes:              >6.4% ? ?Glycemic control for   <7.0% ?adults with diabetes ?  ? ?CBG: ?Recent Labs  ?Lab 04/26/21 ?1327 04/26/21 ?1525 04/26/21 ?1908 04/26/21 ?2324 04/27/21 ?0329  ?GLUCAP 97 110* 207* 275* 211*  ? ?Past Medical History:  ?She,  has a past medical history of Allergy, Anemia, GERD (gastroesophageal reflux disease), HLA B27 (HLA B27 positive), UTI  (urinary tract infection), Hyperplastic colon polyp, Hypertension, IBS (irritable bowel syndrome), and Seizures ([place]).  ?Surgical History:  ? ?Past Surgical History:  ?Procedure Laterality Date  ? APPENDE

## 2021-04-27 NOTE — Evaluation (Signed)
Clinical/Bedside Swallow Evaluation ?Patient Details  ?Name: [name] ?MRN: [id number] ?Date of Birth: 1960/07/28 ? ?Today's Date: 04/27/2021 ?Time: SLP Start Time (ACUTE ONLY): 1630 SLP Stop Time (ACUTE ONLY): 1740 ?SLP Time Calculation (min) (ACUTE ONLY): 20 min ? ?Past Medical History:  ?Past Medical History:  ?Diagnosis Date  ? Allergy   ? seasonal  ? Anemia   ? GERD (gastroesophageal reflux disease)   ? HLA B27 (HLA B27 positive)   ? Hx: UTI (urinary tract infection)   ? Hyperplastic colon polyp   ? Hypertension   ? IBS (irritable bowel syndrome)   ? Seizures ([place])   ? hx epilepsy-last seizure 9/14 after taking tramadol  ? ?Past Surgical History:  ?Past Surgical History:  ?Procedure Laterality Date  ? APPENDECTOMY    ? BLADDER SURGERY  2007  ? CESAREAN SECTION    ? x 2   ? ORIF TOE FRACTURE Left 06/27/2013  ? Procedure: OPEN REDUCTION INTERNAL FIXATION (ORIF) LEFT FIFTH METATARSAL;  Surgeon: Wylene Simmer, MD;  Location: [facility];  Service: Orthopedics;  Laterality: Left;  ? PARTIAL HYSTERECTOMY  2006  ? TONSILLECTOMY    ? TUBAL LIGATION    ? ?HPI:  ?Patient is a 61 y.o. female with PMH: MS, ANEMIA, GERD, HTN, IBS, seizures. She presented to the hospital c/o weakness and leg pain for 3 weeks. In the ER she was hypotensive, tachycardic and required vasopressors. She recieved 5/2 L fluid overnight and was hyperkalemic, in renal failure and had leukocytosis. CXR in ER showed pneumothorax and chest tube was placed. She is being treated for Septic shock due to cellulitis in setting of immunocompromised state improved. SLP swallow eval ordered due to RN concern for patient's ability to swallow PO's.  ?  ?Assessment / Plan / Recommendation  ?Clinical Impression ? Patient currently presenting with what appears to be an esophageal dysphagia with potential pharyngeal component. She did not exhibit any overt s/s aspiration or penetration with sips of water or bites of soft fruit, however she c/o almost  immediate pain (resolved after a second or two) which she indicated was occuring approximately mid chest level. Patient does report h/o dysphagia (has h/o GERD). When swallowing piece of fruit, she asked SLP to move slightly away as she was worried it would "come back up" and she would need to spit it out. SLP is recommending to allow patient to have liquids and softer solids (yogurts, soft fruits, etc) that she feels she can tolerate and plan for likely objective swallow study next date. Patient and RN in agreement. ?SLP Visit Diagnosis: Dysphagia, unspecified (R13.10) ?   ?Aspiration Risk ?    ?  ?Diet Recommendation Other (Comment)  ? ?Liquid Administration via: Straw;Cup ?Medication Administration: Whole meds with liquid ?Supervision: Patient able to self feed;Full supervision/cueing for compensatory strategies ?Compensations: Slow rate;Small sips/bites ?Postural Changes: Seated upright at 90 degrees;Remain upright for at least 30 minutes after po intake  ?  ?Other  Recommendations Oral Care Recommendations: Oral care BID   ? ?Recommendations for follow up therapy are one component of a multi-disciplinary discharge planning process, led by the attending physician.  Recommendations may be updated based on patient status, additional functional criteria and insurance authorization. ? ?Follow up Recommendations Other (comment) (TBD)  ? ? ?  ?Assistance Recommended at Discharge Intermittent Supervision/Assistance  ?Functional Status Assessment Patient has had a recent decline in their functional status and demonstrates the ability to make significant improvements in function in a reasonable and predictable  amount of time.  ?Frequency and Duration min 2x/week  ?1 week ?  ?   ? ?Prognosis Prognosis for Safe Diet Advancement: Good  ? ?  ? ?Swallow Study   ?General Date of Onset: 04/26/21 ?HPI: Patient is a 61 y.o. female with PMH: MS, ANEMIA, GERD, HTN, IBS, seizures. She presented to the hospital c/o weakness and leg  pain for 3 weeks. In the ER she was hypotensive, tachycardic and required vasopressors. She recieved 5/2 L fluid overnight and was hyperkalemic, in renal failure and had leukocytosis. CXR in ER showed pneumothorax and chest tube was placed. She is being treated for Septic shock due to cellulitis in setting of immunocompromised state improved. SLP swallow eval ordered due to RN concern for patient's ability to swallow PO's. ?Type of Study: Bedside Swallow Evaluation ?Previous Swallow Assessment: none found ?Diet Prior to this Study: Regular;Thin liquids ?Temperature Spikes Noted: No ?Respiratory Status: Nasal cannula (HFNC) ?History of Recent Intubation: No ?Behavior/Cognition: Alert;Cooperative;Pleasant mood ?Oral Cavity Assessment: Within Functional Limits ?Oral Care Completed by SLP: No ?Oral Cavity - Dentition: Adequate natural dentition ?Vision: Functional for self-feeding ?Self-Feeding Abilities: Able to feed self ?Patient Positioning: Upright in bed ?Baseline Vocal Quality: Low vocal intensity ?Volitional Cough: Congested ?Volitional Swallow: Able to elicit  ?  ?Oral/Motor/Sensory Function Overall Oral Motor/Sensory Function: Within functional limits   ?Ice Chips     ?Thin Liquid Thin Liquid: Impaired ?Presentation: Straw ?Pharyngeal  Phase Impairments: Other (comments) ?Other Comments: patient appeared to require more effortful swallow with water. She c/o of almost immediate pain, pointing to mid chest  ?  ?Nectar Thick     ?Honey Thick     ?Puree     ?Solid ? ? ?  Solid: Impaired ?Pharyngeal Phase Impairments: Other (comments) ?Other Comments: patient initially reporting that she was tolerating pieces of soft cantaloupe but she then started to c/o same pain as with water sips  ? ?  ?Sonia Baller, MA, CCC-SLP ?Speech Therapy ? ? ? ? ?

## 2021-04-27 NOTE — Progress Notes (Signed)
eLink Physician-Brief Progress Note ?Patient Name: [name] ?DOB: 02-15-1960 ?MRN: [id number] ? ? ?Date of Service ? 04/27/2021  ?HPI/Events of Note ? Patient with marginal respiratory status on HFNC/NRB, RT asking for orders to escalate to [facility] if necessary.  ?eICU Interventions ? Respiratory instruction entered enabling transition to [facility] if current set up proves inadequate.  ? ? ? ?  ? ?Frederik Pear ?04/27/2021, 8:16 PM ?

## 2021-04-27 NOTE — Progress Notes (Signed)
Attending: ?  ? ?Subjective: ?61 y/o female with MS complained of weakness and leg pain for 3 weeks.  She was admitted to the ICU for vasopressors.  In the ER she was hypotensive, tachycardic, required vasopressors.  She received 5.2 L fluids overnight.  She was hyperkalemic in renal failure and had a leukocytosis.  She had antibiotics administered in the ER.  CXR in the ER showed pneumothorax, chest tube placed.   ? ?She receives an anti CD4 treatment for her MS. ? ?Currently on high flow nasal cannula ? ?Chest tube has put out some fluid overnight (about 300cc), still has airleak ? ?Past Medical History:  ?Diagnosis Date  ? Allergy   ? seasonal  ? Anemia   ? GERD (gastroesophageal reflux disease)   ? HLA B27 (HLA B27 positive)   ? Hx: UTI (urinary tract infection)   ? Hyperplastic colon polyp   ? Hypertension   ? IBS (irritable bowel syndrome)   ? Seizures ([place])   ? hx epilepsy-last seizure 9/14 after taking tramadol  ? ? ? ? ?Objective: ?Vitals:  ? 04/27/21 0615 04/27/21 0630 04/27/21 0700 04/27/21 0800  ?BP: 96/66 100/62 (!) 115/46 (!) 80/69  ?Pulse:   71 78  ?Resp:   18 (!) 30  ?Temp:    98 ?F (36.7 ?C)  ?TempSrc:    Oral  ?SpO2:   (!) 89% 93%  ?Weight:      ?Height:      ? ?  ? ?Intake/Output Summary (Last 24 hours) at 04/27/2021 0842 ?Last data filed at 04/27/2021 0800 ?Gross per 24 hour  ?Intake 5503.35 ml  ?Output 2760 ml  ?Net 2743.35 ml  ? ? ?General:  chronically ill appearing, resting comfortably in bed ?HENT: NCAT OP clear ?PULM: Rhonchi lower lobes, upper lobes wheezing B, normal effort ?CV: RRR, no mgr ?GI: BS+, soft, nontender ?MSK: normal bulk and tone ?Neuro: awake, alert, no distress, MAEW ? ? ?CBC ?   ?Component Value Date/Time  ? WBC 15.6 (H) 04/27/2021 0543  ? RBC 3.56 (L) 04/27/2021 0543  ? HGB 10.6 (L) 04/27/2021 0543  ? HCT 29.4 (L) 04/27/2021 0543  ? PLT 301 04/27/2021 0543  ? MCV 82.6 04/27/2021 0543  ? MCH 29.8 04/27/2021 0543  ? MCHC 36.1 (H) 04/27/2021 0543  ? RDW 14.4 04/27/2021 0543  ?  LYMPHSABS 1.3 12/29/2013 1858  ? MONOABS 0.4 12/29/2013 1858  ? EOSABS 0.0 12/29/2013 1858  ? BASOSABS 0.0 12/29/2013 1858  ? ? ?BMET ?   ?Component Value Date/Time  ? NA 134 (L) 04/27/2021 0543  ? K 3.9 04/27/2021 0543  ? CL 99 04/27/2021 0543  ? CO2 19 (L) 04/27/2021 0543  ? GLUCOSE 151 (H) 04/27/2021 0543  ? BUN 78 (H) 04/27/2021 0543  ? CREATININE 2.84 (H) 04/27/2021 0543  ? CALCIUM 7.1 (L) 04/27/2021 0543  ? GFRNONAA 18 (L) 04/27/2021 0543  ? GFRAA >90 12/29/2013 1858  ? ? ?CXR images bibasilar interstitial infiltrates, R pigtail chest tube in place, personally reviewed ? ?Impression/Plan: ? ?Septic shock due to cellulitis in setting of immunocompromised state improved> has open wounds; continue vanc, zosyn, f/u blood culture, continue wound care; wean off levophed for MAP > 65, continue vasopressin. Continue hydrocortisone ? ?Neck pain> MRI spine pending  ? ?AKI> improving with IV fluids, continue fluids, monitor UOP and BMET ? ?Acute respiratory failure with hypoxemia > unclear cause, pneumonia vs acute pulmonary edema, CXR now, wean off O2, avoid more fluids, continue antibiotics; out of bed  if able, incentive spirometry ? ?Hyperglycemia> increase SSI to moderate scale ? ?Increased pharyngeal secretions> add robinul ? ?Seizure disorder> keppra/gabapentin to continue ? ?Pneumothorax on right, iatrogenic> continue chest tube to suction, repeat CXR ? ?History of MS> discuss f/u plan with patient, treatment as above ? ?Centrilobular emphysema> add scheduled duoneb,  ?Cavitary nodule LUL> monitor serial imaging as outpatient ? ?My cc time 42 minutes ? ?Roselie Awkward, MD ?[facility] ?Pager: [phone] ?Cell: [phone] ?After 7pm: [phone] ? ?

## 2021-04-28 ENCOUNTER — Inpatient Hospital Stay (HOSPITAL_COMMUNITY): Payer: Medicare Other

## 2021-04-28 DIAGNOSIS — R579 Shock, unspecified: Secondary | ICD-10-CM | POA: Diagnosis not present

## 2021-04-28 DIAGNOSIS — R0603 Acute respiratory distress: Secondary | ICD-10-CM

## 2021-04-28 LAB — HEPATIC FUNCTION PANEL
ALT: 21 U/L (ref 0–44)
AST: 39 U/L (ref 15–41)
Albumin: 2.1 g/dL — ABNORMAL LOW (ref 3.5–5.0)
Alkaline Phosphatase: 53 U/L (ref 38–126)
Bilirubin, Direct: 0.1 mg/dL (ref 0.0–0.2)
Indirect Bilirubin: 0.5 mg/dL (ref 0.3–0.9)
Total Bilirubin: 0.6 mg/dL (ref 0.3–1.2)
Total Protein: 4.8 g/dL — ABNORMAL LOW (ref 6.5–8.1)

## 2021-04-28 LAB — BASIC METABOLIC PANEL
Anion gap: 13 (ref 5–15)
BUN: 62 mg/dL — ABNORMAL HIGH (ref 8–23)
CO2: 22 mmol/L (ref 22–32)
Calcium: 7.8 mg/dL — ABNORMAL LOW (ref 8.9–10.3)
Chloride: 101 mmol/L (ref 98–111)
Creatinine, Ser: 1.48 mg/dL — ABNORMAL HIGH (ref 0.44–1.00)
GFR, Estimated: 40 mL/min — ABNORMAL LOW (ref >60)
Glucose, Bld: 109 mg/dL — ABNORMAL HIGH (ref 70–99)
Potassium: 3.2 mmol/L — ABNORMAL LOW (ref 3.5–5.1)
Sodium: 136 mmol/L (ref 135–145)

## 2021-04-28 LAB — POCT I-STAT 7, (LYTES, BLD GAS, ICA,H+H)
Acid-base deficit: 1 mmol/L (ref 0.0–2.0)
Bicarbonate: 22.8 mmol/L (ref 20.0–28.0)
Calcium, Ion: 1.06 mmol/L — ABNORMAL LOW (ref 1.15–1.40)
HCT: 26 % — ABNORMAL LOW (ref 36.0–46.0)
Hemoglobin: 8.8 g/dL — ABNORMAL LOW (ref 12.0–15.0)
O2 Saturation: 95 %
Potassium: 3.1 mmol/L — ABNORMAL LOW (ref 3.5–5.1)
Sodium: 137 mmol/L (ref 135–145)
TCO2: 24 mmol/L (ref 22–32)
pCO2 arterial: 35.2 mmHg (ref 32–48)
pH, Arterial: 7.419 (ref 7.35–7.45)
pO2, Arterial: 73 mmHg — ABNORMAL LOW (ref 83–108)

## 2021-04-28 LAB — ECHOCARDIOGRAM COMPLETE
AR max vel: 2.66 cm2
AV Peak grad: 4.8 mmHg
Ao pk vel: 1.1 m/s
Area-P 1/2: 4.49 cm2
Calc EF: 58.7 %
Height: 64 in
S' Lateral: 3.5 cm
Single Plane A2C EF: 60.8 %
Single Plane A4C EF: 57.8 %
Weight: 2426.82 oz

## 2021-04-28 LAB — GLUCOSE, CAPILLARY
Glucose-Capillary: 110 mg/dL — ABNORMAL HIGH (ref 70–99)
Glucose-Capillary: 141 mg/dL — ABNORMAL HIGH (ref 70–99)
Glucose-Capillary: 99 mg/dL (ref 70–99)
Glucose-Capillary: 103 mg/dL — ABNORMAL HIGH (ref 70–99)
Glucose-Capillary: 109 mg/dL — ABNORMAL HIGH (ref 70–99)

## 2021-04-28 LAB — MAGNESIUM: Magnesium: 1.6 mg/dL — ABNORMAL LOW (ref 1.7–2.4)

## 2021-04-28 LAB — CBC
HCT: 28.8 % — ABNORMAL LOW (ref 36.0–46.0)
Hemoglobin: 10.3 g/dL — ABNORMAL LOW (ref 12.0–15.0)
MCH: 29.6 pg (ref 26.0–34.0)
MCHC: 35.8 g/dL (ref 30.0–36.0)
MCV: 82.8 fL (ref 80.0–100.0)
Platelets: 228 10*3/uL (ref 150–400)
RBC: 3.48 MIL/uL — ABNORMAL LOW (ref 3.87–5.11)
RDW: 14.3 % (ref 11.5–15.5)
WBC: 12 10*3/uL — ABNORMAL HIGH (ref 4.0–10.5)
nRBC: 0 % (ref 0.0–0.2)

## 2021-04-28 MED ORDER — OSMOLITE 1.2 CAL PO LIQD
1000.0000 mL | ORAL | Status: DC
  Administered 2021-04-28 – 2021-05-12 (×18): 1000 mL
  Filled 2021-04-28 (×24): qty 1000

## 2021-04-28 MED ORDER — VANCOMYCIN HCL 750 MG/150ML IV SOLN
750.0000 mg | INTRAVENOUS | Status: DC
  Administered 2021-04-28: 750 mg via INTRAVENOUS
  Filled 2021-04-28: qty 150

## 2021-04-28 MED ORDER — ETOMIDATE 2 MG/ML IV SOLN
INTRAVENOUS | Status: AC
  Filled 2021-04-28: qty 20

## 2021-04-28 MED ORDER — LEVETIRACETAM 100 MG/ML PO SOLN
500.0000 mg | Freq: Two times a day (BID) | ORAL | Status: DC
  Administered 2021-04-28 – 2021-05-13 (×30): 500 mg
  Filled 2021-04-28 (×30): qty 5

## 2021-04-28 MED ORDER — GLYCOPYRROLATE 1 MG PO TABS
1.0000 mg | ORAL_TABLET | Freq: Two times a day (BID) | ORAL | Status: DC
  Administered 2021-04-28 – 2021-05-13 (×30): 1 mg
  Filled 2021-04-28 (×30): qty 1

## 2021-04-28 MED ORDER — SUCCINYLCHOLINE CHLORIDE 200 MG/10ML IV SOSY
PREFILLED_SYRINGE | INTRAVENOUS | Status: AC
  Filled 2021-04-28: qty 10

## 2021-04-28 MED ORDER — DEXMEDETOMIDINE HCL IN NACL 400 MCG/100ML IV SOLN
INTRAVENOUS | Status: AC
  Filled 2021-04-28: qty 100

## 2021-04-28 MED ORDER — MORPHINE SULFATE (PF) 2 MG/ML IV SOLN
INTRAVENOUS | Status: AC
  Filled 2021-04-28: qty 1

## 2021-04-28 MED ORDER — FENTANYL CITRATE (PF) 100 MCG/2ML IJ SOLN
INTRAMUSCULAR | Status: AC
  Filled 2021-04-28: qty 2

## 2021-04-28 MED ORDER — FUROSEMIDE 10 MG/ML IJ SOLN
20.0000 mg | Freq: Once | INTRAMUSCULAR | Status: AC
  Administered 2021-04-28: 20 mg via INTRAVENOUS
  Filled 2021-04-28: qty 2

## 2021-04-28 MED ORDER — MORPHINE SULFATE (PF) 2 MG/ML IV SOLN
1.0000 mg | INTRAVENOUS | Status: AC | PRN
  Administered 2021-04-28 (×2): 1 mg via INTRAVENOUS
  Filled 2021-04-28: qty 1

## 2021-04-28 MED ORDER — ORAL CARE MOUTH RINSE
15.0000 mL | Freq: Two times a day (BID) | OROMUCOSAL | Status: DC
  Administered 2021-04-28 – 2021-05-14 (×28): 15 mL via OROMUCOSAL

## 2021-04-28 MED ORDER — MAGNESIUM SULFATE 4 GM/100ML IV SOLN
4.0000 g | Freq: Once | INTRAVENOUS | Status: AC
  Administered 2021-04-28: 4 g via INTRAVENOUS
  Filled 2021-04-28: qty 100

## 2021-04-28 MED ORDER — GABAPENTIN 250 MG/5ML PO SOLN
300.0000 mg | Freq: Two times a day (BID) | ORAL | Status: DC
  Administered 2021-04-28 – 2021-05-13 (×31): 300 mg
  Filled 2021-04-28 (×31): qty 6

## 2021-04-28 MED ORDER — GABAPENTIN 250 MG/5ML PO SOLN
300.0000 mg | Freq: Two times a day (BID) | ORAL | Status: DC
  Filled 2021-04-28 (×2): qty 6

## 2021-04-28 MED ORDER — KETAMINE HCL 50 MG/5ML IJ SOSY
PREFILLED_SYRINGE | INTRAMUSCULAR | Status: AC
  Filled 2021-04-28: qty 5

## 2021-04-28 MED ORDER — POTASSIUM CHLORIDE 10 MEQ/50ML IV SOLN
10.0000 meq | INTRAVENOUS | Status: AC
  Administered 2021-04-28 (×6): 10 meq via INTRAVENOUS
  Filled 2021-04-28 (×6): qty 50

## 2021-04-28 MED ORDER — MORPHINE SULFATE (PF) 2 MG/ML IV SOLN
1.0000 mg | INTRAVENOUS | Status: DC | PRN
  Administered 2021-04-28: 1 mg via INTRAVENOUS
  Filled 2021-04-28: qty 1

## 2021-04-28 MED ORDER — MIDAZOLAM HCL 2 MG/2ML IJ SOLN
INTRAMUSCULAR | Status: AC
  Filled 2021-04-28: qty 2

## 2021-04-28 MED ORDER — PROSOURCE TF PO LIQD
45.0000 mL | Freq: Two times a day (BID) | ORAL | Status: DC
  Administered 2021-04-28 – 2021-05-13 (×30): 45 mL
  Filled 2021-04-28 (×32): qty 45

## 2021-04-28 MED ORDER — LEVETIRACETAM IN NACL 500 MG/100ML IV SOLN
500.0000 mg | Freq: Two times a day (BID) | INTRAVENOUS | Status: DC
  Administered 2021-04-28: 500 mg via INTRAVENOUS
  Filled 2021-04-28: qty 100

## 2021-04-28 MED ORDER — ADULT MULTIVITAMIN W/MINERALS CH
1.0000 | ORAL_TABLET | Freq: Every day | ORAL | Status: DC
  Administered 2021-04-29 – 2021-05-13 (×15): 1
  Filled 2021-04-28 (×15): qty 1

## 2021-04-28 MED ORDER — ROCURONIUM BROMIDE 10 MG/ML (PF) SYRINGE
PREFILLED_SYRINGE | INTRAVENOUS | Status: AC
  Filled 2021-04-28: qty 10

## 2021-04-28 MED ORDER — DEXMEDETOMIDINE HCL IN NACL 400 MCG/100ML IV SOLN
0.0000 ug/kg/h | INTRAVENOUS | Status: AC
  Administered 2021-04-28: 0.2 ug/kg/h via INTRAVENOUS

## 2021-04-28 MED ORDER — CHLORHEXIDINE GLUCONATE 0.12 % MT SOLN
15.0000 mL | Freq: Two times a day (BID) | OROMUCOSAL | Status: DC
  Administered 2021-04-28 – 2021-05-14 (×31): 15 mL via OROMUCOSAL
  Filled 2021-04-28 (×26): qty 15

## 2021-04-28 NOTE — Progress Notes (Signed)
Initial Nutrition Assessment ? ?DOCUMENTATION CODES:  ? ?Not applicable ? ?INTERVENTION:  ? ?Initiate tube feeding via Cortrak (after x-ray confirmation of placement): ?Osmolite 1.2 at 20 ml/h, increase by 10 ml every 4 hours to goal rate of 60 ml/h (1440 ml per day) ?Prosource TF 45 ml BID ? ?Provides 1728 kcal, 102 gm protein, 1181 ml free water daily. ? ?MVI with minerals daily via tube. ? ?NUTRITION DIAGNOSIS:  ? ?Increased nutrient needs related to acute illness, wound healing as evidenced by estimated needs. ? ?GOAL:  ? ?Patient will meet greater than or equal to 90% of their needs ? ?MONITOR:  ? ?TF tolerance, Skin, Labs ? ?REASON FOR ASSESSMENT:  ? ?Consult ?Enteral/tube feeding initiation and management ? ?ASSESSMENT:  ? ?61 yo female admitted with hypovolemic/septic shock. PMH includes GERD, HTN, IBS, seizures. ? ?Patient is currently on BiPAP. ?SLP unable to safely take POs. ?Discussed patient with RN and CCM MD. ?Cortrak being placed today. ?Received MD Consult for TF initiation and management. ? ?Labs reviewed. K 3.2, Phos 5.8, mag 1.6 ?CBG: 110-141 ? ?Medications reviewed and include Lasix, Solu-cortef, Novolog, Precedex, Keppra, Levophed, vasopressin. ? ?Weight history reviewed. No significant weight changes noted.  ? ?NUTRITION - FOCUSED PHYSICAL EXAM: ? ?Unable to complete ? ?Diet Order:   ?Diet Order   ? ? None  ? ?  ? ? ?EDUCATION NEEDS:  ? ?Not appropriate for education at this time ? ?Skin:  Skin Assessment: Skin Integrity Issues: ?Skin Integrity Issues:: Other (Comment) ?Other: bilateral pretibial wounds/cellulitis; R foot non-pressure wound ? ?Last BM:  no BM documented ? ?Height:  ? ?Ht Readings from Last 1 Encounters:  ?04/26/21 5\' 4"  (1.626 m)  ? ? ?Weight:  ? ?Wt Readings from Last 1 Encounters:  ?04/27/21 68.8 kg  ? ? ?BMI:  Body mass index is 26.04 kg/m?. ? ?Estimated Nutritional Needs:  ? ?Kcal:  1700-1900 ? ?Protein:  95-110 gm ? ?Fluid:  1.7-1.9 L ? ? ? ?Lucas Mallow RD, LDN,  CNSC ?Please refer to Amion for contact information.                                                       ? ?

## 2021-04-28 NOTE — Progress Notes (Signed)
eLink Physician-Brief Progress Note ?Patient Name: [name] ?DOB: [birth year] ?MRN: [id number] ? ? ?Date of Service ? 04/28/2021  ?HPI/Events of Note ? Patient with increased work of breathing / respiratory distress on HHFNC /  NRB, saturation 91 %. Patient also appears anxious.  ?eICU Interventions ? BIPAP ordered, Morphine 1 mg iv for air hunger, stat portable CXR to follow up pneumothorax, ABG in one hour, Low infusion rate Precedex ordered for light sedation / anxiolysis.  ? ? ? ?Intervention Category ?Major Interventions: Respiratory failure - evaluation and management ?Intermediate Interventions: Respiratory distress - evaluation and management ? ?Migdalia Dk ?04/28/2021, 1:45 AM ?

## 2021-04-28 NOTE — Progress Notes (Addendum)
Called to evaluate increased o2 req, increased PTX size.   ?Pigtail looks to be in good position, dressed, with good air leak.   ?Patient appears comfortable on bipap, satting high 90s.   ?Decreased PEEP to 5 from 8.  I pap remained at 8. ?Nurse had flushed the chest tube.   ?Repeat CXR in 1 hr.  ?

## 2021-04-28 NOTE — Progress Notes (Signed)
Echocardiogram not completed patient undergoing another procedure. Will re-attempt at another time.  [name]

## 2021-04-28 NOTE — Progress Notes (Addendum)
2059 Respond to patient call light - she is agitated and confused, calling out for help. Upon entering room, she states she does not remember me. She states to me "you are creepy, I need you out of my face". Sent in female staff to assist/reassure patient. Oriented only to self, reoriented by female staff. Pola Corn MD Dr. Warrick Parisian notified of sudden change. While patient family was in room at 1940, she was oriented x3, was only unsure of the current month, calm and cooperative.  ?

## 2021-04-28 NOTE — Progress Notes (Signed)
eLink Physician-Brief Progress Note ?Patient Name: [name] ?DOB: 1960/04/18 ?MRN: [id number] ? ? ?Date of Service ? 04/28/2021  ?HPI/Events of Note ? CXR shows proximal dislodgement of pig tail chest tube, and larger pneumothorax, wall suction is currently 40 cm.  ?eICU Interventions ? Will ask the PCCM ground crew to evaluate the chest tube.  ? ? ? ?  ? ?Migdalia Dk ?04/28/2021, 2:34 AM ?

## 2021-04-28 NOTE — Progress Notes (Signed)
eLink Physician-Brief Progress Note ?Patient Name: [name] ?DOB: [birth year] ?MRN: [id number] ? ? ?Date of Service ? 04/28/2021  ?HPI/Events of Note ? Bedside RN was concerned about patient being a bit confused, after her family departed from visiting this evening.  ?eICU Interventions ? On assessment, patient is wakeful and oriented x 2, she admits to fatigue, which in the context of MS may be the reason for mild confusion earlier, she is currently lucid (answering questions appropriately). Will monitor closely, avoid potentially delirium causing medications.  ? ? ? ?  ? ?Migdalia Dk ?04/28/2021, 9:35 PM ?

## 2021-04-28 NOTE — Progress Notes (Signed)
SLP Cancellation Note ? ?Patient Details ?Name: [name] ?MRN: [id number] ?DOB: 1960/07/15 ? ? ?Cancelled treatment:       Reason Eval/Treat Not Completed: Patient not medically ready ?Checked in with RN about MBS. RN reports pt on BiPAP this am. Not stable for MBS ? ?Zhane Donlan, Riley Nearing ?04/28/2021, 8:14 AM ?

## 2021-04-28 NOTE — Procedures (Signed)
Insertion of Chest Tube Procedure Note ? ?MALEEAH CROSSMAN  ?[id number]  ?1960-02-22 ? ?Date:04/28/21  ?Time:10:42 AM  ? ? ?Provider Performing: Posey Boyer  ? ?Procedure: Chest Tube Insertion (86381) ? ?Indication(s) ?Pneumothorax ? ?Consent ?Risks of the procedure as well as the alternatives and risks of each were explained to the patient and/or caregiver.  Consent for the procedure was obtained and is signed in the bedside chart ? ?Anesthesia ?Topical only with 1% lidocaine  ? ? ?Time Out ?Verified patient identification, verified procedure, site/side was marked, verified correct patient position, special equipment/implants available, medications/allergies/relevant history reviewed, required imaging and test results available. ? ? ?Sterile Technique ?Maximal sterile technique including full sterile barrier drape, hand hygiene, sterile gown, sterile gloves, mask, hair covering, sterile ultrasound probe cover (if used). ? ? ?Procedure Description ?Ultrasound used to identify appropriate pleural anatomy for placement and overlying skin marked. Area of placement cleaned and draped in sterile fashion.  A 14 French pigtail pleural catheter was placed into the right apical pleural space using Seldinger technique. Appropriate return of air was obtained.  The tube was connected to atrium and placed on -20 cm H2O wall suction. Line sutured, biopatch, and sterile dressing placed.  ? ?Varying 2 to 5 airleak present. Great tidaling  ? ? ?Complications/Tolerance ?None; patient tolerated the procedure well. ?Chest X-ray is ordered to verify placement. ? ? ?EBL ?Minimal ? ?Specimen(s) ?none ? ? ? ? ?Posey Boyer, ACNP ?[facility] ?04/28/2021, 10:45 AM ? ?See Amion for pager ?If no response to pager, please call PCCM consult pager ?After 7:00 pm call Elink   ? ?

## 2021-04-28 NOTE — Progress Notes (Signed)
? ?NAME:  [name], MRN:  [id number], DOB:  02-Sep-1960, LOS: 2 ?ADMISSION DATE:  04/26/2021, CONSULTATION DATE:  04/17 ?REFERRING MD:  EDP, CHIEF COMPLAINT:  Septic shock  ? ?History of Present Illness:  ?This is a 66 yof w/ h/o MS (walks w/ walker at baseline). She started being treated w/ Ocrelizumab back in Feb. Per pt and family about 4 weeks after began to notice increased RLE swelling, scaling and weakness. She attributed this to an expected side effects of the medication. Then about 5-7d prior to presentation she developed increased weakness, worsening right leg pain, poor PO intake, and neck pain. These symptoms continued to worsen to point pain was no longer bearable and she was so weak that she was no longer able to get OOB.  ?  ?In ER she was  hypotensive in 70s, tachycardic, encephalopathic, wbc ct 17.2, CO2 9 w/ + anion gap metabolic acidosis, BUN 119, creatinine 7.81 and K 6.2 ?Pertinent  Medical History  ?MS walks w/ walker, GERD, HLA B27 positive, Pulm nodule, seizure, sacroiliitis, ankylosing spondylitis, fibromyalgia ?Significant Hospital Events: ?Including procedures, antibiotic start and stop dates in addition to other pertinent events   ?4/17 admitted w/ septic shock working dx cellulitis (RLE), Cultures sent, MRI ordered C-spine, CT lower ext ordered, Zosyn and Vanc started  ?04/19-BiPAP placed. Another chest tube placed for persistent pneumothorax.  ?Interim History / Subjective:  ?Increasing oxygen requirement. Lasix IV 20 mg given. CXR ordered. Morphine 1 mg ordered for air hunger and low dose precedex ordered for light sedation. Placed on Bipap at night. CXR showed larger pneumo and slight dislodgement of chest tube.  ?Review of Systems:   ?Negative unless stated in the subjective.  ?Objective   ?Blood pressure 110/73, pulse (!) 50, temperature 97.9 ?F (36.6 ?C), temperature source Axillary, resp. rate 10, height 5' 4"  (1.626 m), weight 68.8 kg, SpO2 95 %. ?CVP:  [3 mmHg-19 mmHg] 19  mmHg  ?FiO2 (%):  [100 %] 100 %  ? ?Intake/Output Summary (Last 24 hours) at 04/28/2021 0645 ?Last data filed at 04/28/2021 0600 ?Gross per 24 hour  ?Intake 1688.45 ml  ?Output 1690 ml  ?Net -1.55 ml  ? ?1.6 L urine output and 1 stool output ?Filed Weights  ? 04/26/21 0917 04/27/21 0446  ?Weight: 63.5 kg 68.8 kg  ? ?Examination: ?General: NAD ?HENT: NCAT, on BiPAP with 100 and 8 IPAP and 5 EPAP ?Lungs: rhonci present and wheeze present, chest tube on right with air bubble present ?Cardiovascular: NSR, 2+ pulses ?Abdomen: soft, normal bowel sounds ?Extremities: LE bandaged ?Neuro: alert and oriented x4 ?GU: foley in place with yellow urine output ?Consults  ?Nephrology ?Resolved Hospital Problem list   ?Hyperkalemia s/p IVF and insulin ?Acute metabolic encephalopathy after renal fxn improvement ?Anion gap metabolic acidosis ?Assessment & Plan:  ?Septic shock 2/2 bilateral Lower extremity Cellulitis  ?Possible Pneumonia ?Resolving. Patient is immunocompromised from both h/o being HLA B27 positive And s/p Ocrelizumab. CT LE show cellulitis. Skin lesion consistent with side effects of Ocrelizumab which may be have been underlying cause for the cellulitis. Blood cultures ordered and pending; NGTD. Currently on levo and vasopressin and hydrocortisone 100 mg BID.  Repeat CXR finding concerning for possible pneumonia. Given her immunocompromised status, opportunistic infections can not be ruled out. Aggressive treatment option of intubation and bronchoscopy offered to the family but patient' husband states patient would want to avoid intubation as long as it is possible. She received IV lasix overnight and had 1 L output  since then. Will give another lasix 20 mg IV today.  ?-Give lasix 20 mg IV, monitor ouput ?-Continue zosyn and dc vanc ?-Follow Bcx and Ucx. ?-Titrate Norepi for MAP > 65 ? ?AKI  ?Improving. 1.48 this am. Anion gap resolved. Baseline around 0.6 in 08/2020. Endorsed poor oral  US renal shows no hydronephrosis.  Appears to be pre-renal AKI improving with IVF.  Nephrology signed off. MAP gaol > 65, will monitor creatinine daily.  ?-CTM renal fxn ?-Renal dose meds ?-Strict I&O ? ?Iatrogenic Pneumothorax ?CXR showed right pneumothorax. Had varying oxygen requirement from Bipap to 2L [place], to 13 L HFNC. BiPAP this am. Chest tube placed and output around 30 cc since yesterday with air bubble present. Given her persistent pneumothorax, will place another chest tube today.  ?-Will continue to monitor and wean as able of supplemental oxygen.  ?-Chest tube placement today.  ? ?Multiple Sclerosis ?Follows with [employer]. Recently started infusion of Ocrelizumab on 02/2021.  ?-Continue home rubinul.  ?-CTM ? ?Tobacco use disorder ?COPD ?Has 30 pack smoking hx. Will offer nicotene patch and counsel on smoking cessation. Will schedule duo-nebs.  ?-CTM ? ?H/o cavitary pulmonary nodule LUL ?CT showed slight interval decrease in left upper and right lower lobe cavitary nodules consistent with infectious or inflammatory process. New ill defined reticular and centrolobar nodularity in ligula and left lower lobe concerning for atypical infection. New mild smooth interlobar septal thickening in the right lower lobe, suggestive of mild asymmetric interstitial pulmonary edema. Has chronic pulmonary nodules and hx of emphysema 2/2 to tobacco use disorder.  ?-Will recommend outpatient follow up ? ?Hyperglycemia  ?2/2 to steroids. SSI with moderate scale. Goal 140-180. CTM ?Best Practice (right click and "Reselect all SmartList Selections" daily)  ?Diet/type: renal diet ?DVT prophylaxis: prophylactic heparin  ?GI prophylaxis: N/A ?Lines: N/A ?Foley:  Yes ?Continuous: Vasopressin 0.03, Levophed 3 ?Code Status:  full code ?Last date of multidisciplinary goals of care discussion [04/18 with husband] ?Labs   ?CBC shows improved leukocytosis. Hgb stable. BMP shows improved renal fxn creatinine at 1. 5 and BUN at 62. K and Mag low and  repleted. ABG ordered and pH 7.42/pCO2 35/PO2 73. First CXR showed larger pneumo from prior but later one showed some improvement.  ?CBC: ?Recent Labs  ?Lab 04/26/21 ?0930 04/26/21 ?2048 04/27/21 ?0543 04/27/21 ?1452 04/27/21 ?1508 04/28/21 ?0257 04/28/21 ?0310  ?WBC 17.2*  --  15.6*  --   --   --  12.0*  ?HGB 12.3   < > 10.6* 10.2* 9.2* 8.8* 10.3*  ?HCT 37.4   < > 29.4* 30.0* 27.0* 26.0* 28.8*  ?MCV 90.8  --  82.6  --   --   --  82.8  ?PLT 330  --  301  --   --   --  228  ? < > = values in this interval not displayed.  ? ? ?Basic Metabolic Panel: ?Recent Labs  ?Lab 04/26/21 ?0930 04/26/21 ?1548 04/26/21 ?2048 04/27/21 ?0543 04/27/21 ?1452 04/27/21 ?1456 04/27/21 ?1508 04/28/21 ?0257 04/28/21 ?0310  ?NA 135 138   < > 134* 134* 136 134* 137 136  ?K 6.2* 5.0   < > 3.9 3.8 3.8 3.7 3.1* 3.2*  ?CL 103 114*  --  99  --  103  --   --  101  ?CO2 9* 10*  --  19*  --  20*  --   --  22  ?GLUCOSE 72 159*  --  151*  --  151*  --   --  109*  ?BUN 120* 99*  --  78*  --  73*  --   --  62*  ?CREATININE 7.81* 4.71*  --  2.84*  --  2.12*  --   --  1.48*  ?CALCIUM 7.0* 6.1*  --  7.1*  --  7.4*  --   --  7.8*  ?MG  --   --   --  1.8  --   --   --   --  1.6*  ?PHOS  --   --   --  5.8*  --   --   --   --   --   ? < > = values in this interval not displayed.  ? ? ?GFR: ?Estimated Creatinine Clearance: 37 mL/min (A) (by C-G formula based on SCr of 1.48 mg/dL (H)). ?Recent Labs  ?Lab 04/26/21 ?0930 04/26/21 ?1307 04/27/21 ?9390 04/28/21 ?0310  ?PROCALCITON  --  0.78 0.89  --   ?WBC 17.2*  --  15.6* 12.0*  ?LATICACIDVEN 1.4  --   --   --   ? ? ?Liver Function Tests: ?Recent Labs  ?Lab 04/26/21 ?0930 04/27/21 ?3009 04/28/21 ?0310  ?AST 21 31 39  ?ALT 17 17 21   ?ALKPHOS 84 65 53  ?BILITOT 0.7 0.5 0.6  ?PROT 6.3* 4.8* 4.8*  ?ALBUMIN 3.1* 2.2* 2.1*  ? ? ?No results for input(s): LIPASE, AMYLASE in the last 168 hours. ?No results for input(s): AMMONIA in the last 168 hours. ?ABG ?   ?Component Value Date/Time  ? PHART 7.419 04/28/2021 0257  ?  PCO2ART 35.2 04/28/2021 0257  ? PO2ART 73 (L) 04/28/2021 0257  ? HCO3 22.8 04/28/2021 0257  ? TCO2 24 04/28/2021 0257  ? ACIDBASEDEF 1.0 04/28/2021 0257  ? O2SAT 95 04/28/2021 0257  ? ?  ?Coagulation Profil

## 2021-04-28 NOTE — Procedures (Signed)
Cortrak ? ?Person Inserting Tube:  Andrey Spearman, RD ?Tube Type:  Cortrak - 43 inches ?Tube Size:  10 ?Tube Location:  Left nare ?Secured by: Tijeras Callas ?Technique Used to Measure Tube Placement:  Marking at nare/corner of mouth ?Cortrak Secured At:  61 cm ? ?Cortrak Tube Team Note: ? ?Consult received to place a Cortrak feeding tube.  ? ?X-ray is required, abdominal x-ray has been ordered by the Cortrak team. Please confirm tube placement before using the Cortrak tube.  ? ?If the tube becomes dislodged please keep the tube and contact the Cortrak team at [email or web address] (password [email or web address]) for replacement.  ?If after hours and replacement cannot be delayed, place a NG tube and confirm placement with an abdominal x-ray.  ? ? ?Kirby Crigler RD, LDN ?Clinical Dietitian ?See AMiON for contact information.  ? ? ?

## 2021-04-28 NOTE — Progress Notes (Signed)
Pt placed on Bipap per MD orders.  Pt tolerating well at this time  ?

## 2021-04-28 NOTE — Progress Notes (Signed)
RT titrated HHFNC to 25L per patient's RR. No distress noted. RR 18, spo2 94%. Patient is tolerating settings well. BIPAP is not needed at this time. BIPAP is at bedside on standby if needed. RT will continue to monitor.  ?

## 2021-04-28 NOTE — Progress Notes (Signed)
eLink Physician-Brief Progress Note ?Patient Name: [name] ?DOB: 03-05-1960 ?MRN: [id number] ? ? ?Date of Service ? 04/28/2021  ?HPI/Events of Note ? Repeat CXR show a sizeable pneumothorax, on camera into the room patient appears comfortable, with saturation in the high 90'2 on BIPAP.  ?eICU Interventions ? I requested that dr. Marcos Eke pass on to the daytime PCCM attending covering @[facility]  that they take a look at [facility] 05 on arrival regarding her persistent pneumothorax, Dr. [facility] indicated that she will pass the message along to the covering MD.  ? ? ? ?  ? ?Marcos Eke ?04/28/2021, 6:39 AM ?

## 2021-04-28 NOTE — Progress Notes (Signed)
Attending: ?  ? ?Subjective: ?Increasing oxygen requirement overnight ?Pneumothorax bigger overnight ?Started on BIPAP overnight ?Given morphine, started on precedex, turned off this morning ?Remains on BIPAP this morning ?Still has 2+ air leak  ?100cc fluid output since yesterday ?Vasopressor requirement down ? ?Objective: ?Vitals:  ? 04/28/21 0430 04/28/21 0445 04/28/21 0600 04/28/21 0700  ?BP:   110/73 100/63  ?Pulse: (!) 48 (!) 46 (!) 50 (!) 44  ?Resp: 14 14 10 11   ?Temp:      ?TempSrc:      ?SpO2: 95% 96% 95% 91%  ?Weight:      ?Height:      ? ?FiO2 (%):  [100 %] 100 % ? ?Intake/Output Summary (Last 24 hours) at 04/28/2021 0805 ?Last data filed at 04/28/2021 0700 ?Gross per 24 hour  ?Intake 1493.63 ml  ?Output 1500 ml  ?Net -6.37 ml  ? ? ?General:  Resting comfortably in bed ?HENT: NCAT OP clear ?PULM: CTA B, normal effort ?CV: RRR, no mgr ?GI: BS+, soft, nontender ?MSK: normal bulk and tone ?Neuro: awake, confused, oriented to year but not sitaution or place, MAEW ? ? ? ?CBC ?   ?Component Value Date/Time  ? WBC 12.0 (H) 04/28/2021 0310  ? RBC 3.48 (L) 04/28/2021 0310  ? HGB 10.3 (L) 04/28/2021 0310  ? HCT 28.8 (L) 04/28/2021 0310  ? PLT 228 04/28/2021 0310  ? MCV 82.8 04/28/2021 0310  ? MCH 29.6 04/28/2021 0310  ? MCHC 35.8 04/28/2021 0310  ? RDW 14.3 04/28/2021 0310  ? LYMPHSABS 1.3 12/29/2013 1858  ? MONOABS 0.4 12/29/2013 1858  ? EOSABS 0.0 12/29/2013 1858  ? BASOSABS 0.0 12/29/2013 1858  ? ? ?BMET ?   ?Component Value Date/Time  ? NA 136 04/28/2021 0310  ? K 3.2 (L) 04/28/2021 0310  ? CL 101 04/28/2021 0310  ? CO2 22 04/28/2021 0310  ? GLUCOSE 109 (H) 04/28/2021 0310  ? BUN 62 (H) 04/28/2021 0310  ? CREATININE 1.48 (H) 04/28/2021 0310  ? CALCIUM 7.8 (L) 04/28/2021 0310  ? GFRNONAA 40 (L) 04/28/2021 0310  ? GFRAA >90 12/29/2013 1858  ? ? ?CXR images reviewed, bilateral  upper lobe predominant airspace disease, trace effusion on the left ? ?Impression/Plan: ?Acute respiratory failure with hypoxemia: has  worsening bilateral infiltrates which could be pulmonary edema but given her immunocompromised state I worry about an opportunistic pneumonia; continue BIPAP, will discuss more aggressive options with family today: bronchoscopy, mechanical ventilation.  ?Pulmonary edema, acute: lasix today ?Pneumothorax> needs anterior chest tube today, will place ?High aspiration risk> NPO, too sick for MBS ?Nutrition needs> place cor track ?Cellulitis > d/c vanc, continue zosyn ?Aspiration pneumonia, no organism, bilateral infiltrates> zosyn ?AKI> improving, monitor UOP ?Septic shock> wean off levophed for MAP > 65, cont vasopressin, f/u echo ? ?Her husband says that she would want to avoid a ventilator if possible (so he doesn't favor an immediate intubation and bronchoscopy right now) but she is a full code in the event of cardiac or respiratory arrest.   ? ?My cc time 35 minutes ? ?12/31/2013, MD ?[facility] ?Pager: [phone] ?Cell: [phone] ?After 7pm: [phone] ? ?

## 2021-04-28 NOTE — Progress Notes (Signed)
Pharmacy Antibiotic Note ? ?[name] is a 61 y.o. female admitted on 04/26/2021 with septic shock 2/2 bilateral lower extremity cellulitis thought to be an immunotherapy (Ocrevus) related toxicity.  Pharmacy has been consulted for Vancomycin and Zosyn dosing. ? ?Patients renal function has greatly improved since admission (SCr 4.71>1.48). WBC are slightly elevated at 12 and patient remains afebrile.  ? ?Plan: ?Vancomycin 750 mg q24h (eAUC = 469, SCr used = 1.48, Vd= 0.7) ?Zosyn 3.375g IV q8h (4 hour infusion). ?Monitor renal function and clinical status ?Follow up cultures and ability to narrow antibiotic regimen  ?Vancomycin levels as indicated ? ?Height: 5\' 4"  (162.6 cm) ?Weight: 68.8 kg (151 lb 10.8 oz) ?IBW/kg (Calculated) : 54.7 ? ?Temp (24hrs), Avg:98.1 ?F (36.7 ?C), Min:97.9 ?F (36.6 ?C), Max:98.7 ?F (37.1 ?C) ? ?Recent Labs  ?Lab 04/26/21 ?0930 04/26/21 ?1548 04/27/21 ?0543 04/27/21 ?1456 04/28/21 ?0310  ?WBC 17.2*  --  15.6*  --  12.0*  ?CREATININE 7.81* 4.71* 2.84* 2.12* 1.48*  ?LATICACIDVEN 1.4  --   --   --   --   ?  ?Estimated Creatinine Clearance: 37 mL/min (A) (by C-G formula based on SCr of 1.48 mg/dL (H)).   ? ?Allergies  ?Allergen Reactions  ? Lamictal [Lamotrigine] Rash  ? Other   ?  Cats: Rash  ?Shrimp scampi sauce: rash  ? Tramadol   ?  Lowers seizure threshold  ? ? ?Antimicrobials this admission: ?Cefepime 4/17 x1 ?Vancomycin 4/17 >>  ?Zosyn 4/18 >> ? ?Microbiology results: ?4/17 BCx: NGTD ?4/17 UCx: pending  ? ?Thank you for allowing pharmacy to be a part of this patient?s care. ? ?Joseph Art, Pharm.D. ?PGY-1 Pharmacy Resident ?[id number] ?04/28/2021 6:27 AM ? ?

## 2021-04-28 NOTE — Progress Notes (Signed)
[facility] ADULT ICU REPLACEMENT PROTOCOL ? ? ?The patient does apply for the [facility] Adult ICU Electrolyte Replacment Protocol based on the criteria listed below:  ? ?1.Exclusion criteria: TCTS patients, ECMO patients, and Dialysis patients ?2. Is GFR >/= 30 ml/min? Yes.    ?Patient's GFR today is 40 ?3. Is SCr </= 2? Yes.   ?Patient's SCr is 1.48 mg/dL ?4. Did SCr increase >/= 0.5 in 24 hours? No. ?5.Pt's weight >40kg  Yes.   ?6. Abnormal electrolyte(s):   K 3.2, Mg 1.6  ?7. Electrolytes replaced per protocol ?8.  Call MD STAT for K+ </= 2.5, Phos </= 1, or Mag </= 1 ?Physician:  Wyn Forster ? ?Sedonia Small 04/28/2021 4:32 AM ? ?

## 2021-04-28 NOTE — Progress Notes (Signed)
eLink Physician-Brief Progress Note ?Patient Name: [name] ?DOB: [birth year] ?MRN: [id number] ? ? ?Date of Service ? 04/28/2021  ?HPI/Events of Note ? Patient with increasing oxygen requirements over the course of the evening, RR 22, CVP 9, + 2.6 liters positive fluid balance, lung crackles (per bedside RN), saturation remains 92 - 94 % on 100 % FIO2. UOP is diminished.  ?eICU Interventions ? Lasix 20 mg iv x 1 ordered.  ? ? ? ?  ? ?Migdalia Dk ?04/28/2021, 12:18 AM ?

## 2021-04-29 ENCOUNTER — Inpatient Hospital Stay (HOSPITAL_COMMUNITY): Payer: Medicare Other

## 2021-04-29 DIAGNOSIS — R579 Shock, unspecified: Secondary | ICD-10-CM | POA: Diagnosis not present

## 2021-04-29 LAB — CBC
HCT: 28.4 % — ABNORMAL LOW (ref 36.0–46.0)
Hemoglobin: 9.8 g/dL — ABNORMAL LOW (ref 12.0–15.0)
MCH: 29.3 pg (ref 26.0–34.0)
MCHC: 34.5 g/dL (ref 30.0–36.0)
MCV: 84.8 fL (ref 80.0–100.0)
Platelets: 142 10*3/uL — ABNORMAL LOW (ref 150–400)
RBC: 3.35 MIL/uL — ABNORMAL LOW (ref 3.87–5.11)
RDW: 14.1 % (ref 11.5–15.5)
WBC: 10.6 10*3/uL — ABNORMAL HIGH (ref 4.0–10.5)
nRBC: 0 % (ref 0.0–0.2)

## 2021-04-29 LAB — BASIC METABOLIC PANEL
Anion gap: 12 (ref 5–15)
BUN: 59 mg/dL — ABNORMAL HIGH (ref 8–23)
CO2: 23 mmol/L (ref 22–32)
Calcium: 8.2 mg/dL — ABNORMAL LOW (ref 8.9–10.3)
Chloride: 106 mmol/L (ref 98–111)
Creatinine, Ser: 1.37 mg/dL — ABNORMAL HIGH (ref 0.44–1.00)
GFR, Estimated: 43 mL/min — ABNORMAL LOW (ref >60)
Glucose, Bld: 154 mg/dL — ABNORMAL HIGH (ref 70–99)
Potassium: 3.1 mmol/L — ABNORMAL LOW (ref 3.5–5.1)
Sodium: 141 mmol/L (ref 135–145)

## 2021-04-29 LAB — MAGNESIUM: Magnesium: 2.4 mg/dL (ref 1.7–2.4)

## 2021-04-29 LAB — GLUCOSE, CAPILLARY
Glucose-Capillary: 143 mg/dL — ABNORMAL HIGH (ref 70–99)
Glucose-Capillary: 147 mg/dL — ABNORMAL HIGH (ref 70–99)
Glucose-Capillary: 148 mg/dL — ABNORMAL HIGH (ref 70–99)
Glucose-Capillary: 165 mg/dL — ABNORMAL HIGH (ref 70–99)
Glucose-Capillary: 166 mg/dL — ABNORMAL HIGH (ref 70–99)
Glucose-Capillary: 176 mg/dL — ABNORMAL HIGH (ref 70–99)
Glucose-Capillary: 189 mg/dL — ABNORMAL HIGH (ref 70–99)

## 2021-04-29 LAB — URINE CULTURE: Culture: >=100000 — AB

## 2021-04-29 MED ORDER — POTASSIUM CHLORIDE 10 MEQ/50ML IV SOLN
10.0000 meq | INTRAVENOUS | Status: AC
  Administered 2021-04-29 (×4): 10 meq via INTRAVENOUS
  Filled 2021-04-29 (×4): qty 50

## 2021-04-29 MED ORDER — POTASSIUM CHLORIDE 20 MEQ PO PACK
20.0000 meq | PACK | ORAL | Status: AC
  Administered 2021-04-29 (×2): 20 meq
  Filled 2021-04-29 (×2): qty 1

## 2021-04-29 MED ORDER — GUAIFENESIN 100 MG/5ML PO LIQD: 5.0000 mL | Freq: Four times a day (QID) | ORAL | Status: DC | PRN

## 2021-04-29 MED ORDER — MAGNESIUM SULFATE 4 GM/100ML IV SOLN
4.0000 g | Freq: Once | INTRAVENOUS | Status: DC
  Filled 2021-04-29: qty 100

## 2021-04-29 MED ORDER — MELATONIN 3 MG PO TABS
3.0000 mg | ORAL_TABLET | Freq: Once | ORAL | Status: AC
  Administered 2021-04-29: 3 mg via ORAL
  Filled 2021-04-29: qty 1

## 2021-04-29 MED ORDER — GUAIFENESIN-CODEINE 100-10 MG/5ML PO SOLN
5.0000 mL | Freq: Four times a day (QID) | ORAL | Status: DC
  Administered 2021-04-29 – 2021-05-01 (×8): 5 mL via ORAL
  Filled 2021-04-29 (×8): qty 5

## 2021-04-29 MED ORDER — GUAIFENESIN 100 MG/5ML PO LIQD: 10.0000 mL | Freq: Four times a day (QID) | ORAL | Status: DC | PRN

## 2021-04-29 MED ORDER — HYDROCOD POLI-CHLORPHE POLI ER 10-8 MG/5ML PO SUER: 5.0000 mL | Freq: Two times a day (BID) | ORAL | Status: DC

## 2021-04-29 MED ORDER — FUROSEMIDE 10 MG/ML IJ SOLN
20.0000 mg | Freq: Once | INTRAMUSCULAR | Status: AC
  Administered 2021-04-29: 20 mg via INTRAVENOUS
  Filled 2021-04-29: qty 2

## 2021-04-29 MED ORDER — GUAIFENESIN 100 MG/5ML PO LIQD
10.0000 mL | Freq: Four times a day (QID) | ORAL | Status: DC
Start: 2021-04-29 — End: 2021-04-29

## 2021-04-29 MED ORDER — POTASSIUM CHLORIDE 20 MEQ PO PACK: 20.0000 meq | PACK | ORAL | Status: DC

## 2021-04-29 MED ORDER — POTASSIUM CHLORIDE 10 MEQ/50ML IV SOLN: 10.0000 meq | INTRAVENOUS | Status: DC

## 2021-04-29 MED ORDER — CALCIUM GLUCONATE-NACL 1-0.675 GM/50ML-% IV SOLN
1.0000 g | Freq: Once | INTRAVENOUS | Status: AC
  Administered 2021-04-29: 1000 mg via INTRAVENOUS
  Filled 2021-04-29: qty 50

## 2021-04-29 MED ORDER — SODIUM CHLORIDE 0.9 % IV SOLN
2.0000 g | INTRAVENOUS | Status: AC
  Administered 2021-04-29 – 2021-05-05 (×7): 2 g via INTRAVENOUS
  Filled 2021-04-29 (×7): qty 20

## 2021-04-29 NOTE — Progress Notes (Signed)
[facility] ADULT ICU REPLACEMENT PROTOCOL ? ? ?The patient does apply for the [facility] Adult ICU Electrolyte Replacment Protocol based on the criteria listed below:  ? ?1.Exclusion criteria: TCTS patients, ECMO patients, and Dialysis patients ?2. Is GFR >/= 30 ml/min? Yes.    ?Patient's GFR today is 43 ?3. Is SCr </= 2? Yes.   ?Patient's SCr is 1.37 mg/dL ?4. Did SCr increase >/= 0.5 in 24 hours? No. ?5.Pt's weight >40kg  Yes.   ?6. Abnormal electrolyte(s):   K 3.1  ?7. Electrolytes replaced per protocol ?8.  Call MD STAT for K+ </= 2.5, Phos </= 1, or Mag </= 1 ?Physician:  Lona Kettle ? ?Wilburt Finlay 04/29/2021 5:38 AM ? ?

## 2021-04-29 NOTE — Progress Notes (Signed)
eLink Physician-Brief Progress Note ?Patient Name: TYSHERA PRATTS ?DOB: [birth year] ?MRN: [id number] ? ? ?Date of Service ? 04/29/2021  ?HPI/Events of Note ? Ca ++  1.06  ?eICU Interventions ? Calcium gluconate replacement per E-Link Electrolyte Replacement Protocol.  ? ? ? ?  ? ?Kerry Kass Deldrick Linch ?04/29/2021, 4:53 AM ?

## 2021-04-29 NOTE — Progress Notes (Signed)
? ?NAME:  [name], MRN:  [id number], DOB:  18-Jun-1960, LOS: 3 ?ADMISSION DATE:  04/26/2021, CONSULTATION DATE:  04/17 ?REFERRING MD:  EDP, CHIEF COMPLAINT:  Septic shock  ? ?History of Present Illness:  ?This is a 10 yof w/ h/o MS (walks w/ walker at baseline). She started being treated w/ Ocrelizumab back in Feb. Per pt and family about 4 weeks after began to notice increased RLE swelling, scaling and weakness. She attributed this to an expected side effects of the medication. Then about 5-7d prior to presentation she developed increased weakness, worsening right leg pain, poor PO intake, and neck pain. These symptoms continued to worsen to point pain was no longer bearable and she was so weak that she was no longer able to get OOB.  ?  ?In ER she was  hypotensive in 70s, tachycardic, encephalopathic, wbc ct 17.2, CO2 9 w/ + anion gap metabolic acidosis, BUN 297, creatinine 7.81 and K 6.2 ?Pertinent  Medical History  ?MS walks w/ walker, GERD, HLA B27 positive, Pulm nodule, seizure, sacroiliitis, ankylosing spondylitis, fibromyalgia ?Significant Hospital Events: ?Including procedures, antibiotic start and stop dates in addition to other pertinent events   ?4/17 admitted w/ septic shock working dx cellulitis (RLE), Cultures sent, MRI ordered C-spine, CT lower ext ordered, Zosyn and Vanc started  ?04/19-BiPAP placed. Another chest tube placed for persistent pneumothorax.  ?04/20-Lateral chest tube removed.  ?Interim History / Subjective:  ?Confused. Denying any acute concerns. [place].  ?Review of Systems:   ?Negative unless stated in the subjective.  ?Objective   ?Blood pressure (!) 80/60, pulse 93, temperature 98.1 ?F (36.7 ?C), temperature source Axillary, resp. rate 12, height 5' 4"  (1.626 m), weight 61.2 kg, SpO2 90 %. ?CVP:  [10 mmHg] 10 mmHg  ?FiO2 (%):  [10 %-100 %] 100 %  ? ?Intake/Output Summary (Last 24 hours) at 04/29/2021 0723 ?Last data filed at 04/29/2021 9892 ?Gross per 24 hour   ?Intake 1000.81 ml  ?Output 1695 ml  ?Net -694.19 ml  ? ?1.7 L urine output and 1 stool output ?Filed Weights  ? 04/26/21 0917 04/27/21 0446 04/29/21 0500  ?Weight: 63.5 kg 68.8 kg 61.2 kg  ? ?Examination: ?General: NAD, laying in bed.  ?HENT: on HFNC 25 L, 100 %, dry mucus membranes ?Lungs: rhonci present ?Cardiovascular: NSR, tachycardic rate,  2+ pulses ?Abdomen: soft, normal bowel sounds ?Extremities: no asymmetry, bruises on bilateral hands POA ?Neuro: alert but oriented x2 ?GU: catheter in place.  ?Consults  ?Nephrology ?Resolved Hospital Problem list   ?Hyperkalemia s/p IVF and insulin ?Acute metabolic encephalopathy after renal fxn improvement ?Anion gap metabolic acidosis ?Assessment & Plan:  ?Septic shock 2/2 bilateral Lower extremity Cellulitis  ?Possible Pneumonia ?Acute metabolic encephalopathy ?Resolving. Patient is immunocompromised from both h/o being HLA B27 positive And s/p Ocrelizumab. CT LE show cellulitis. Skin lesion consistent with side effects of Ocrelizumab which may be have been underlying cause for the cellulitis. Blood cultures ordered and pending; NGTD. Currently on levo and hydrocortisone 100 mg BID.  Leukocytosis resolved. Will switch abx to ceftriaxone. Acute metabolic encephalopathy most likely multifactorial secondary to ICU delirium, and medications.  ?-Follow Bcx and Ucx. ?-Delirium precautions ?-Titrate Norepi for MAP > 65 ? ?AKI  ?Improving. 1.48 this am. Anion gap resolved. Baseline around 0.6 in 08/2020. Endorsed poor oral before hospitalization.  US renal shows no hydronephrosis. Appears to be pre-renal AKI improving with IVF.  Nephrology signed off. MAP gaol > 65, will monitor creatinine daily.  ?-  CTM renal fxn ?-Renal dose meds ?-Strict I&O ? ?Iatrogenic Pneumothorax ?CXR showed right pneumothorax. Had varying oxygen requirement with BiPAP this am. Second chest tube placed with air bubble present. Will proceed with removing the lateral chest tube today.  ?-Will continue to  monitor and wean as able of supplemental oxygen.  ?-CTM ? ?Multiple Sclerosis ?Follows with [employer]. Recently started infusion of Ocrelizumab on 02/2021.  ?-Continue home rubinul.  ?-CTM ? ?Tobacco use disorder ?COPD ?Has 30 pack smoking hx. Will offer nicotene patch and counsel on smoking cessation. Will schedule duo-nebs.  ?-CTM ? ?H/o cavitary pulmonary nodule LUL ?CT showed slight interval decrease in left upper and right lower lobe cavitary nodules consistent with infectious or inflammatory process. New ill defined reticular and centrolobar nodularity in ligula and left lower lobe concerning for atypical infection. New mild smooth interlobar septal thickening in the right lower lobe, suggestive of mild asymmetric interstitial pulmonary edema. Has chronic pulmonary nodules and hx of emphysema 2/2 to tobacco use disorder.  ?-Will recommend outpatient follow up ? ?Hyperglycemia  ?Well controlled. 2/2 to steroids. SSI with moderate scale. Goal 140-180. CTM ?Best Practice (right click and "Reselect all SmartList Selections" daily)  ?Diet/type: renal diet ?DVT prophylaxis: prophylactic heparin  ?GI prophylaxis: N/A ?Lines: N/A ?Foley:  Yes ?Continuous: Levophed 3 ?Code Status:  full code ?Last date of multidisciplinary goals of care discussion [04/19 with husband] ?Labs   ?CBC shows improved leukocytosis. Hgb stable. BMP shows improved renal fxn creatinine at 1. 4 and BUN at 59. K low and mag normal. K repleted. CBG in range.  ?CBC: ?Recent Labs  ?Lab 04/26/21 ?0930 04/26/21 ?2048 04/27/21 ?0543 04/27/21 ?1452 04/27/21 ?1508 04/28/21 ?0257 04/28/21 ?0310 04/29/21 ?0319  ?WBC 17.2*  --  15.6*  --   --   --  12.0* 10.6*  ?HGB 12.3   < > 10.6* 10.2* 9.2* 8.8* 10.3* 9.8*  ?HCT 37.4   < > 29.4* 30.0* 27.0* 26.0* 28.8* 28.4*  ?MCV 90.8  --  82.6  --   --   --  82.8 84.8  ?PLT 330  --  301  --   --   --  228 142*  ? < > = values in this interval not displayed.  ? ? ?Basic Metabolic Panel: ?Recent Labs   ?Lab 04/26/21 ?1548 04/26/21 ?2048 04/27/21 ?0543 04/27/21 ?1452 04/27/21 ?1456 04/27/21 ?1508 04/28/21 ?0257 04/28/21 ?0310 04/29/21 ?0319  ?NA 138   < > 134*   < > 136 134* 137 136 141  ?K 5.0   < > 3.9   < > 3.8 3.7 3.1* 3.2* 3.1*  ?CL 114*  --  99  --  103  --   --  101 106  ?CO2 10*  --  19*  --  20*  --   --  22 23  ?GLUCOSE 159*  --  151*  --  151*  --   --  109* 154*  ?BUN 99*  --  78*  --  73*  --   --  62* 59*  ?CREATININE 4.71*  --  2.84*  --  2.12*  --   --  1.48* 1.37*  ?CALCIUM 6.1*  --  7.1*  --  7.4*  --   --  7.8* 8.2*  ?MG  --   --  1.8  --   --   --   --  1.6* 2.4  ?PHOS  --   --  5.8*  --   --   --   --   --   --   ? < > =  values in this interval not displayed.  ? ? ?GFR: ?Estimated Creatinine Clearance: 36.3 mL/min (A) (by C-G formula based on SCr of 1.37 mg/dL (H)). ?Recent Labs  ?Lab 04/26/21 ?0930 04/26/21 ?1307 04/27/21 ?0543 04/28/21 ?0310 04/29/21 ?0319  ?PROCALCITON  --  0.78 0.89  --   --   ?WBC 17.2*  --  15.6* 12.0* 10.6*  ?LATICACIDVEN 1.4  --   --   --   --   ? ? ?Liver Function Tests: ?Recent Labs  ?Lab 04/26/21 ?0930 04/27/21 ?4712 04/28/21 ?0310  ?AST 21 31 39  ?ALT 17 17 21   ?ALKPHOS 84 65 53  ?BILITOT 0.7 0.5 0.6  ?PROT 6.3* 4.8* 4.8*  ?ALBUMIN 3.1* 2.2* 2.1*  ? ? ?No results for input(s): LIPASE, AMYLASE in the last 168 hours. ?No results for input(s): AMMONIA in the last 168 hours. ?ABG ?   ?Component Value Date/Time  ? PHART 7.419 04/28/2021 0257  ? PCO2ART 35.2 04/28/2021 0257  ? PO2ART 73 (L) 04/28/2021 0257  ? HCO3 22.8 04/28/2021 0257  ? TCO2 24 04/28/2021 0257  ? ACIDBASEDEF 1.0 04/28/2021 0257  ? O2SAT 95 04/28/2021 0257  ? ?  ?Coagulation Profile: ?Recent Labs  ?Lab 04/26/21 ?0930  ?INR 1.3*  ? ? ?Cardiac Enzymes: ?Recent Labs  ?Lab 04/26/21 ?1547  ?CKTOTAL 941*  ? ? ?HbA1C: ?Hgb A1c MFr Bld  ?Date/Time Value Ref Range Status  ?04/26/2021 01:07 PM 5.0 4.8 - 5.6 % Final  ?  Comment:  ?  (NOTE) ?Pre diabetes:          5.7%-6.4% ? ?Diabetes:               >6.4% ? ?Glycemic control for   <7.0% ?adults with diabetes ?  ? ?CBG: ?Recent Labs  ?Lab 04/28/21 ?1125 04/28/21 ?1510 04/28/21 ?1915 04/28/21 ?2338 04/29/21 ?0311  ?GLUCAP 99 103* 109* 176* 147*  ? ? ?[facility]

## 2021-04-29 NOTE — Progress Notes (Signed)
Attending: ?  ? ?Subjective: ?Second chest tube placed on 4/19 ?Melatonin given for sleep last night ?More confused this morning ?Remains off BIPAP, on heated high flow nasal canula ?MAP > 65 ?Nearly off levophed ?No fever ?Still has persistent air leak from chest tube placed on 4/20 ?Renal function improving ? ? ?Objective: ?Vitals:  ? 04/29/21 0530 04/29/21 0600 04/29/21 0630 04/29/21 0800  ?BP:    92/60  ?Pulse: 96 (!) 105 93 (!) 105  ?Resp: 15 (!) 21 12 19   ?Temp:    98.1 ?F (36.7 ?C)  ?TempSrc:      ?SpO2: 90% 90% 90% 90%  ?Weight:      ?Height:      ? ?FiO2 (%):  [80 %-100 %] 100 % ? ?Intake/Output Summary (Last 24 hours) at 04/29/2021 0824 ?Last data filed at 04/29/2021 0800 ?Gross per 24 hour  ?Intake 943.54 ml  ?Output 1370 ml  ?Net -426.46 ml  ? ? ?General:  Resting comfortably in bed ?HENT: NCAT OP clear ?PULM: Few wheezes B, normal effort ?CV: RRR, no mgr ?GI: BS+, soft, nontender ?MSK: Diminishedbulk and tone ?Derm: bilateral lower legs wrapped ?Neuro: awake, confused MAEW ? ? ? ?CBC ?   ?Component Value Date/Time  ? WBC 10.6 (H) 04/29/2021 0319  ? RBC 3.35 (L) 04/29/2021 0319  ? HGB 9.8 (L) 04/29/2021 0319  ? HCT 28.4 (L) 04/29/2021 0319  ? PLT 142 (L) 04/29/2021 0319  ? MCV 84.8 04/29/2021 0319  ? MCH 29.3 04/29/2021 0319  ? MCHC 34.5 04/29/2021 0319  ? RDW 14.1 04/29/2021 0319  ? LYMPHSABS 1.3 12/29/2013 1858  ? MONOABS 0.4 12/29/2013 1858  ? EOSABS 0.0 12/29/2013 1858  ? BASOSABS 0.0 12/29/2013 1858  ? ? ?BMET ?   ?Component Value Date/Time  ? NA 141 04/29/2021 0319  ? K 3.1 (L) 04/29/2021 0319  ? CL 106 04/29/2021 0319  ? CO2 23 04/29/2021 0319  ? GLUCOSE 154 (H) 04/29/2021 0319  ? BUN 59 (H) 04/29/2021 0319  ? CREATININE 1.37 (H) 04/29/2021 0319  ? CALCIUM 8.2 (L) 04/29/2021 0319  ? GFRNONAA 43 (L) 04/29/2021 0319  ? GFRAA >90 12/29/2013 1858  ? ? ?CXR images reviewed today> two chest tubes on right, no pneumothorax ? ?Impression/Plan: ?Septic shock due to cellulitis and likely pneumonia improved  hemodynamics > narrow to ceftriaxone today, wean off levophed for MAP > 65 ?AKI > improving, continue to monitor UOP and BMET ?Acute respiratory failure with hypoxemia > monitor O2 saturation, continue heated high flow oxygen ?Pneumothorax > removal lateral chest tube, maintain the anterior chest tube in place, repeat CXR in AM ?Acute metabolic encephalopathy > minimize sedation, frequent orientation ? ?My cc time 31 minutes ? ?12/31/2013, MD ? PCCM ?Pager: [phone] ?Cell: [phone] ?After 7pm: [phone] ? ?

## 2021-04-29 NOTE — Progress Notes (Addendum)
eLink Physician-Brief Progress Note ?Patient Name: [name] ?DOB: 09/14/1960 ?MRN: [id number] ? ? ?Date of Service ? 04/29/2021  ?HPI/Events of Note ? Bedside RN asking for something to help patient rest . Otherwise denies concerns.  ?eICU Interventions ? Melatonin 3 mg via NG tube x 1 ordered.  ? ? ? ?  ? ?[name] ?04/29/2021, 12:15 AM ?

## 2021-04-29 NOTE — Progress Notes (Signed)
Speech Language Pathology Treatment: Dysphagia  ?Patient Details ?Name: [name] ?MRN: [id number] ?DOB: 1960/07/27 ?Today's Date: 04/29/2021 ?Time: [id number] ?SLP Time Calculation (min) (ACUTE ONLY): 18 min ? ?Assessment / Plan / Recommendation ?Clinical Impression ? Ms. Krajnik was seen at bedside for dysphagia treatment. Pt is making steady progress towards dysphagia goals. RN reported that pt is not asking for food/drinks, but has dry oral cavity. RN has been facilitating oral sponge to provide moisture to pt's oral cavity. Pt is currently on supplemental 25L high flow nasal cannula. Given pt's current respiratory status, she is not medically appropriate for MBS this date. SLP facilitated ice chips via tsp to the pt at bedside. Pt tolerated ~5 ice chips with no overt s/s of aspiration. Primary concern for pt's swallowing is esophageal dysphagia with possible pharyngeal component. SLP recommends that the pt remain NPO, however she may receive intermittent ice chips from staff. MBS to be completed next date if pt's respiratory status improves. Alternative temporary means of nutrition remain appropriate. SLP to follow. ?  ?HPI HPI: Patient is a 61 y.o. female with PMH: MS, ANEMIA, GERD, HTN, IBS, seizures. She presented to the hospital c/o weakness and leg pain for 3 weeks. In the ER she was hypotensive, tachycardic and required vasopressors. She recieved 5/2 L fluid overnight and was hyperkalemic, in renal failure and had leukocytosis. CXR in ER showed pneumothorax and chest tube was placed. She is being treated for Septic shock due to cellulitis in setting of immunocompromised state improved. SLP swallow eval ordered due to RN concern for patient's ability to swallow PO's. ?  ?   ?SLP Plan ? Continue with current plan of care;MBS ? ?  ?  ?Recommendations for follow up therapy are one component of a multi-disciplinary discharge planning process, led by the attending physician.  Recommendations may be updated  based on patient status, additional functional criteria and insurance authorization. ?  ? ?Recommendations  ?Diet recommendations: NPO;Other(comment) (Intermittent Ice Chips) ?Medication Administration: Via alternative means ?Supervision: Staff to assist with self feeding ?Compensations: Slow rate;Small sips/bites ?Postural Changes and/or Swallow Maneuvers: Seated upright 90 degrees;Upright 30-60 min after meal  ?   ?    ?   ? ? ? ? Oral Care Recommendations: Oral care BID ?Follow Up Recommendations: Other (comment) ?Assistance recommended at discharge: Intermittent Supervision/Assistance ?SLP Visit Diagnosis: Dysphagia, unspecified (R13.10) ?Plan: Continue with current plan of care;MBS ? ? ? ? ?  ?  ? ? ?[name] ? ?04/29/2021, 9:33 AM ?

## 2021-04-29 NOTE — Progress Notes (Signed)
Patient resting comfortably on HHFNC 20L/70%. BIPAP not needed at this time. BIPAP is on standby at bedside if needed. RT will continue to monitor. ?

## 2021-04-29 NOTE — Progress Notes (Deleted)
[facility] ADULT ICU REPLACEMENT PROTOCOL ? ? ?The patient does apply for the [facility] Adult ICU Electrolyte Replacment Protocol based on the criteria listed below:  ? ?1.Exclusion criteria: TCTS patients, ECMO patients, and Dialysis patients ?2. Is GFR >/= 30 ml/min? Yes.    ?Patient's GFR today is 40 ?3. Is SCr </= 2? Yes.   ?Patient's SCr is 1.48 mg/dL ?4. Did SCr increase >/= 0.5 in 24 hours? No. ?5.Pt's weight >40kg  Yes.   ?6. Abnormal electrolyte(s):   K 3.2, Mg 1.6  ?7. Electrolytes replaced per protocol ?8.  Call MD STAT for K+ </= 2.5, Phos </= 1, or Mag </= 1 ?Physician:  Lona Kettle ? ?Wilburt Finlay 04/29/2021 4:28 AM ? ?

## 2021-04-30 ENCOUNTER — Encounter (HOSPITAL_COMMUNITY): Payer: Self-pay | Admitting: Internal Medicine

## 2021-04-30 ENCOUNTER — Inpatient Hospital Stay (HOSPITAL_COMMUNITY): Payer: Medicare Other

## 2021-04-30 DIAGNOSIS — R579 Shock, unspecified: Secondary | ICD-10-CM | POA: Diagnosis not present

## 2021-04-30 LAB — BASIC METABOLIC PANEL
Anion gap: 9 (ref 5–15)
BUN: 51 mg/dL — ABNORMAL HIGH (ref 8–23)
CO2: 25 mmol/L (ref 22–32)
Calcium: 8.6 mg/dL — ABNORMAL LOW (ref 8.9–10.3)
Chloride: 111 mmol/L (ref 98–111)
Creatinine, Ser: 0.98 mg/dL (ref 0.44–1.00)
GFR, Estimated: >60 mL/min (ref >60)
Glucose, Bld: 144 mg/dL — ABNORMAL HIGH (ref 70–99)
Potassium: 3.2 mmol/L — ABNORMAL LOW (ref 3.5–5.1)
Sodium: 145 mmol/L (ref 135–145)

## 2021-04-30 LAB — GLUCOSE, CAPILLARY
Glucose-Capillary: 118 mg/dL — ABNORMAL HIGH (ref 70–99)
Glucose-Capillary: 135 mg/dL — ABNORMAL HIGH (ref 70–99)
Glucose-Capillary: 111 mg/dL — ABNORMAL HIGH (ref 70–99)
Glucose-Capillary: 117 mg/dL — ABNORMAL HIGH (ref 70–99)
Glucose-Capillary: 118 mg/dL — ABNORMAL HIGH (ref 70–99)
Glucose-Capillary: 119 mg/dL — ABNORMAL HIGH (ref 70–99)

## 2021-04-30 LAB — CBC
HCT: 28.6 % — ABNORMAL LOW (ref 36.0–46.0)
Hemoglobin: 9.5 g/dL — ABNORMAL LOW (ref 12.0–15.0)
MCH: 28.9 pg (ref 26.0–34.0)
MCHC: 33.2 g/dL (ref 30.0–36.0)
MCV: 86.9 fL (ref 80.0–100.0)
Platelets: 162 10*3/uL (ref 150–400)
RBC: 3.29 MIL/uL — ABNORMAL LOW (ref 3.87–5.11)
RDW: 14.2 % (ref 11.5–15.5)
WBC: 11.4 10*3/uL — ABNORMAL HIGH (ref 4.0–10.5)
nRBC: 0.4 % — ABNORMAL HIGH (ref 0.0–0.2)

## 2021-04-30 LAB — MAGNESIUM: Magnesium: 1.9 mg/dL (ref 1.7–2.4)

## 2021-04-30 LAB — BRAIN NATRIURETIC PEPTIDE: B Natriuretic Peptide: 1414.1 pg/mL — ABNORMAL HIGH (ref 0.0–100.0)

## 2021-04-30 LAB — CALCIUM, IONIZED: Calcium, Ionized, Serum: 5.3 mg/dL (ref 4.5–5.6)

## 2021-04-30 MED ORDER — ACETAMINOPHEN 500 MG PO TABS
1000.0000 mg | ORAL_TABLET | Freq: Four times a day (QID) | ORAL | Status: DC | PRN
  Administered 2021-04-30 – 2021-05-01 (×3): 1000 mg via ORAL
  Filled 2021-04-30 (×3): qty 2

## 2021-04-30 MED ORDER — HYDROCORTISONE SOD SUC (PF) 100 MG IJ SOLR: 50.0000 mg | Freq: Two times a day (BID) | INTRAMUSCULAR | Status: DC

## 2021-04-30 MED ORDER — FUROSEMIDE 10 MG/ML IJ SOLN
20.0000 mg | Freq: Once | INTRAMUSCULAR | Status: AC
  Administered 2021-04-30: 20 mg via INTRAVENOUS
  Filled 2021-04-30: qty 2

## 2021-04-30 MED ORDER — MAGNESIUM SULFATE 2 GM/50ML IV SOLN
2.0000 g | Freq: Once | INTRAVENOUS | Status: AC
  Administered 2021-04-30: 2 g via INTRAVENOUS
  Filled 2021-04-30: qty 50

## 2021-04-30 MED ORDER — POTASSIUM CHLORIDE 10 MEQ/50ML IV SOLN
10.0000 meq | INTRAVENOUS | Status: AC
  Administered 2021-04-30 (×4): 10 meq via INTRAVENOUS
  Filled 2021-04-30 (×4): qty 50

## 2021-04-30 MED ORDER — POTASSIUM CHLORIDE 20 MEQ PO PACK
20.0000 meq | PACK | ORAL | Status: AC
  Administered 2021-04-30 (×2): 20 meq
  Filled 2021-04-30 (×2): qty 1

## 2021-04-30 MED ORDER — OXYCODONE HCL 5 MG PO TABS
5.0000 mg | ORAL_TABLET | Freq: Four times a day (QID) | ORAL | Status: DC | PRN
  Administered 2021-04-30 – 2021-05-01 (×3): 5 mg via ORAL
  Filled 2021-04-30 (×3): qty 1

## 2021-04-30 MED ORDER — MELATONIN 3 MG PO TABS
3.0000 mg | ORAL_TABLET | Freq: Once | ORAL | Status: AC
  Administered 2021-04-30: 3 mg via ORAL
  Filled 2021-04-30: qty 1

## 2021-04-30 MED ORDER — PNEUMOCOCCAL 20-VAL CONJ VACC 0.5 ML IM SUSY
0.5000 mL | PREFILLED_SYRINGE | INTRAMUSCULAR | Status: AC
  Administered 2021-05-05: 0.5 mL via INTRAMUSCULAR
  Filled 2021-04-30 (×2): qty 0.5

## 2021-04-30 NOTE — Progress Notes (Addendum)
? ?NAME:  [name], MRN:  [id number], DOB:  [birth year], LOS: 4 ?ADMISSION DATE:  04/26/2021, CONSULTATION DATE:  04/17 ?REFERRING MD:  EDP, CHIEF COMPLAINT:  Septic shock  ? ?History of Present Illness:  ?This is a 51 yof w/ h/o MS (walks w/ walker at baseline). She started being treated w/ Ocrelizumab back in Feb. Per pt and family about 4 weeks after began to notice increased RLE swelling, scaling and weakness. She attributed this to an expected side effects of the medication. Then about 5-7d prior to presentation she developed increased weakness, worsening right leg pain, poor PO intake, and neck pain. These symptoms continued to worsen to point pain was no longer bearable and she was so weak that she was no longer able to get OOB.  ?  ?In ER she was  hypotensive in 70s, tachycardic, encephalopathic, wbc ct 17.2, CO2 9 w/ + anion gap metabolic acidosis, BUN 300, creatinine 7.81 and K 6.2 ?Pertinent  Medical History  ?MS walks w/ walker, GERD, HLA B27 positive, Pulm nodule, seizure, sacroiliitis, ankylosing spondylitis, fibromyalgia ?Significant Hospital Events: ?Including procedures, antibiotic start and stop dates in addition to other pertinent events   ?4/17 admitted w/ septic shock working dx cellulitis (RLE), Cultures sent, MRI ordered C-spine, CT lower ext ordered, Zosyn and Vanc started  ?04/19-BiPAP placed. Another chest tube placed for persistent pneumothorax.  ?04/20-Lateral chest tube removed.  ? ?Interim History / Subjective:  ?No overnight events ?Some confusion ? ?Review of Systems:   ?Negative unless stated in the subjective.  ?Objective   ?Blood pressure 100/63, pulse (!) 140, temperature (!) 96.8 ?F (36 ?C), temperature source Axillary, resp. rate (!) 29, height 5' 4"  (1.626 m), weight 59 kg, SpO2 (!) 89 %. ?CVP:  [8 mmHg-12 mmHg] 12 mmHg  ?FiO2 (%):  [70 %-100 %] 100 %  ? ?Intake/Output Summary (Last 24 hours) at 04/30/2021 0739 ?Last data filed at 04/30/2021 0608 ?Gross per 24 hour  ?Intake  3635.7 ml  ?Output 1958 ml  ?Net 1677.7 ml  ?1.7 L urine output and 1 stool output ?Filed Weights  ? 04/27/21 0446 04/29/21 0500 04/30/21 0500  ?Weight: 68.8 kg 61.2 kg 59 kg  ? ?Examination: ?General: Middle-aged lady, does not appear to be in acute distress ?HENT: Remains on high flow nasal cannula, dry oral mucosa ?Lungs: Bilateral rhonchi ?Cardiovascular: S1-S2 appreciated, tachycardic ?Abdomen: Bowel sounds appreciated ?Extremities: No clubbing, no edema ?Neuro: Sleepy, easily arousable, moving all extremities ?GU: catheter in place.  ? ?Echocardiogram 04/28/2021 reviewed showing normal ejection fraction, mildly reduced right ventricular systolic function ? ? ?Consults  ?Nephrology ?Resolved Hospital Problem list   ?Hyperkalemia s/p IVF and insulin ?Acute metabolic encephalopathy after renal fxn improvement ?Anion gap metabolic acidosis ?Assessment & Plan:  ? ?Sepsis secondary to bilateral lower extremity cellulitis ?Current currently pneumonia ?Metabolic encephalopathy ?-S/p ocrelizumab for MS ?-Lower extremity cellulitis felt to be related to ocrelizumab ?-On Levophed, being weaned ?-Hydrocortisone 100 twice daily discontinued-4/20 for ?? Agitation and mental status ?-On ceftriaxone ?-Blood cultures 04/26/2021 negative to date ?-Continue to titrate norepinephrine to MAP greater than 65 ? ?Acute hypoxemic respiratory failure ?-Multifactorial ?-Secondary to underlying obstructive lung disease ?-Wean oxygen supplementation as tolerated ? ?Acute kidney injury ?-Maintain renal perfusion ?-Avoid nephrotoxic medications ?-Was seen by nephrology, signed off ?-BUN of 51/creatinine of 0.98-continues to improve ?-Renal dose medications ? ?Iatrogenic pneumothorax ?-Chest tube in place ?-With continuing air leak ?-Anterior chest tube was placed 04/28/2021 ?-Chest tube to remain in place to suction, chest x-ray  today shows no evidence of pneumothorax ? ?Multiple sclerosis ?-Recently started on ocrelizumab in February  23 ? ?Chronic obstructive pulmonary disease ?Tobacco use disorder ?-Emphysema ?-Bronchodilators ?-Nicotine patch in place ? ?History of cavitary pulmonary nodule left upper lobe ?-Interval decrease in size of nodule ?-Following up with Dr. Melvyn Novas in the outpatient setting ?-To follow-up as outpatient ? ?Hypoglycemia ?-Secondary to steroids ?-Continue SSI ?-Goal glucose of 140-180 ? ?CVP 13 ?-Did receive Lasix ? ?Best Practice (right click and "Reselect all SmartList Selections" daily)  ?Diet/type: renal diet ?DVT prophylaxis: prophylactic heparin  ?GI prophylaxis: N/A ?Lines: N/A ?Foley:  Yes ?Continuous: Levophed 3 ?Code Status:  full code ?Last date of multidisciplinary goals of care discussion [04/19 with husband] ?Labs   ?Leukocytosis continues to improve ?BUN/creatinine continues to improve ?CBC: ?Recent Labs  ?Lab 04/26/21 ?0930 04/26/21 ?2048 04/27/21 ?0543 04/27/21 ?1452 04/27/21 ?1508 04/28/21 ?0257 04/28/21 ?0310 04/29/21 ?4917 04/30/21 ?0217  ?WBC 17.2*  --  15.6*  --   --   --  12.0* 10.6* 11.4*  ?HGB 12.3   < > 10.6*   < > 9.2* 8.8* 10.3* 9.8* 9.5*  ?HCT 37.4   < > 29.4*   < > 27.0* 26.0* 28.8* 28.4* 28.6*  ?MCV 90.8  --  82.6  --   --   --  82.8 84.8 86.9  ?PLT 330  --  301  --   --   --  228 142* 162  ? < > = values in this interval not displayed.  ? ?Basic Metabolic Panel: ?Recent Labs  ?Lab 04/27/21 ?0543 04/27/21 ?1452 04/27/21 ?1456 04/27/21 ?1508 04/28/21 ?0257 04/28/21 ?0310 04/29/21 ?9150 04/30/21 ?0217  ?NA 134*   < > 136 134* 137 136 141 145  ?K 3.9   < > 3.8 3.7 3.1* 3.2* 3.1* 3.2*  ?CL 99  --  103  --   --  101 106 111  ?CO2 19*  --  20*  --   --  22 23 25   ?GLUCOSE 151*  --  151*  --   --  109* 154* 144*  ?BUN 78*  --  73*  --   --  62* 59* 51*  ?CREATININE 2.84*  --  2.12*  --   --  1.48* 1.37* 0.98  ?CALCIUM 7.1*  --  7.4*  --   --  7.8* 8.2* 8.6*  ?MG 1.8  --   --   --   --  1.6* 2.4 1.9  ?PHOS 5.8*  --   --   --   --   --   --   --   ? < > = values in this interval not displayed.   ? ?GFR: ?Estimated Creatinine Clearance: 50.7 mL/min (by C-G formula based on SCr of 0.98 mg/dL). ?Recent Labs  ?Lab 04/26/21 ?0930 04/26/21 ?1307 04/27/21 ?0543 04/28/21 ?0310 04/29/21 ?5697 04/30/21 ?0217  ?PROCALCITON  --  0.78 0.89  --   --   --   ?WBC 17.2*  --  15.6* 12.0* 10.6* 11.4*  ?LATICACIDVEN 1.4  --   --   --   --   --   ? ?Liver Function Tests: ?Recent Labs  ?Lab 04/26/21 ?0930 04/27/21 ?9480 04/28/21 ?0310  ?AST 21 31 39  ?ALT 17 17 21   ?ALKPHOS 84 65 53  ?BILITOT 0.7 0.5 0.6  ?PROT 6.3* 4.8* 4.8*  ?ALBUMIN 3.1* 2.2* 2.1*  ? ?No results for input(s): LIPASE, AMYLASE in the last 168 hours. ?No results for input(s): AMMONIA  in the last 168 hours. ?ABG ?   ?Component Value Date/Time  ? PHART 7.419 04/28/2021 0257  ? PCO2ART 35.2 04/28/2021 0257  ? PO2ART 73 (L) 04/28/2021 0257  ? HCO3 22.8 04/28/2021 0257  ? TCO2 24 04/28/2021 0257  ? ACIDBASEDEF 1.0 04/28/2021 0257  ? O2SAT 95 04/28/2021 0257  ? ?  ?Coagulation Profile: ?Recent Labs  ?Lab 04/26/21 ?0930  ?INR 1.3*  ? ?Cardiac Enzymes: ?Recent Labs  ?Lab 04/26/21 ?1547  ?CKTOTAL 941*  ? ?HbA1C: ?Hgb A1c MFr Bld  ?Date/Time Value Ref Range Status  ?04/26/2021 01:07 PM 5.0 4.8 - 5.6 % Final  ?  Comment:  ?  (NOTE) ?Pre diabetes:          5.7%-6.4% ? ?Diabetes:              >6.4% ? ?Glycemic control for   <7.0% ?adults with diabetes ?  ? ?CBG: ?Recent Labs  ?Lab 04/29/21 ?1122 04/29/21 ?1557 04/29/21 ?1931 04/29/21 ?2322 04/30/21 ?0329  ?GLUCAP 166* 148* 189* 143* 118*  ? ?Past Medical History:  ?She,  has a past medical history of Allergy, Anemia, GERD (gastroesophageal reflux disease), HLA B27 (HLA B27 positive), UTI (urinary tract infection), Hyperplastic colon polyp, Hypertension, IBS (irritable bowel syndrome), Multiple sclerosis ([place]), and Seizures ([place]).  ?Surgical History:  ? ?Past Surgical History:  ?Procedure Laterality Date  ? APPENDECTOMY    ? BLADDER SURGERY  2007  ? CESAREAN SECTION    ? x 2   ? ORIF TOE FRACTURE Left 06/27/2013  ? Procedure:  OPEN REDUCTION INTERNAL FIXATION (ORIF) LEFT FIFTH METATARSAL;  Surgeon: Wylene Simmer, MD;  Location: [place];  Service: Orthopedics;  Laterality: Left;  ? PARTIAL HYSTERECTOMY  2006  ? TONSILLECTOM

## 2021-04-30 NOTE — Progress Notes (Signed)
Nutrition Follow-up ? ?DOCUMENTATION CODES:  ? ?Not applicable ? ?INTERVENTION:  ? ?Continue tube feeding via Cortrak (after x-ray confirmation of placement): ?Osmolite 1.2 at goal rate of 60 ml/h (1440 ml per day) ?Prosource TF 45 ml BID ? ?Provides 1728 kcal, 102 gm protein, 1181 ml free water daily. ? ?MVI with minerals daily via tube. ? ?NUTRITION DIAGNOSIS:  ? ?Increased nutrient needs related to acute illness, wound healing as evidenced by estimated needs. ? ?Ongoing  ? ?GOAL:  ? ?Patient will meet greater than or equal to 90% of their needs ? ?Met with TF ? ?MONITOR:  ? ?TF tolerance, Skin, Labs ? ?REASON FOR ASSESSMENT:  ? ?Consult ?Enteral/tube feeding initiation and management ? ?ASSESSMENT:  ? ?61 yo female admitted with hypovolemic/septic shock r/t BLE cellulitis. PMH includes GERD, HTN, IBS, seizures, MS. ? ?Discussed patient in ICU rounds and with RN today. ?Patient is currently on 25 L heated high flow oxygen via nasal cannula. ?Required Levophed for a few hours this morning. Currently off.  ? ?Cortrak placed 4/19, tip is gastric. ?Currently receiving Osmolite 1.2 at 60 ml/h with Prosource TF 45 ml BID. ?Tolerating TF without difficulty. ?Remains NPO. ? ?1 chest tube in place with 158 ml output x 24 hours. ? ?Labs reviewed. K 3.2 ?CBG: 135-111 ? ?Medications reviewed and include Novolog, Keppra, MVI with minerals, Levophed. ? ?I/O +3.6 L since admission ? ?Muscle loss at least partially related to MS.  ? ?NUTRITION - FOCUSED PHYSICAL EXAM: ? ?Flowsheet Row Most Recent Value  ?Orbital Region Severe depletion  ?Upper Arm Region No depletion  ?Thoracic and Lumbar Region Mild depletion  ?Buccal Region Moderate depletion  ?Temple Region Severe depletion  ?Clavicle Bone Region Moderate depletion  ?Clavicle and Acromion Bone Region Moderate depletion  ?Scapular Bone Region Unable to assess  ?Dorsal Hand Severe depletion  ?Patellar Region Mild depletion  ?Anterior Thigh Region Mild depletion  ?Posterior Calf  Region Unable to assess  ?Edema (RD Assessment) Unable to assess  [BLE wrapped]  ?Hair Reviewed  ?Eyes Reviewed  ?Mouth Reviewed  ?Skin Reviewed  ?Nails Reviewed  ? ?  ? ? ?Diet Order:   ?Diet Order   ? ?       ?  Diet NPO time specified Except for: Ice Chips  Diet effective now       ?  ? ?  ?  ? ?  ? ? ?EDUCATION NEEDS:  ? ?Not appropriate for education at this time ? ?Skin:  Skin Assessment: Skin Integrity Issues: ?Skin Integrity Issues:: Other (Comment) ?Other: bilateral pretibial wounds/cellulitis; R foot non-pressure wound ? ?Last BM:  4/21 type 6 ? ?Height:  ? ?Ht Readings from Last 1 Encounters:  ?04/26/21 _0  (1.626 m)  ? ? ?Weight:  ? ?Wt Readings from Last 1 Encounters:  ?04/30/21 59 kg  ? ? ?BMI:  Body mass index is 22.33 kg/m?. ? ?Estimated Nutritional Needs:  ? ?Kcal:  1700-1900 ? ?Protein:  95-110 gm ? ?Fluid:  1.7-1.9 L ? ? ? ?Lucas Mallow RD, LDN, CNSC ?Please refer to Amion for contact information.                                                       ? ?

## 2021-04-30 NOTE — Progress Notes (Signed)
eLink Physician-Brief Progress Note ?Patient Name: [name] ?DOB: 1960/10/12 ?MRN: [id number] ? ? ?Date of Service ? 04/30/2021  ?HPI/Events of Note ? Patient with sub-optimal pain relief with Tylenol.  ?eICU Interventions ? PRN Oxycodone ordered for breakthrough pain.  ? ? ? ?  ? ?[name] ?04/30/2021, 9:06 PM ?

## 2021-04-30 NOTE — Progress Notes (Addendum)
eLink Physician-Brief Progress Note ?Patient Name: [name] ?DOB: 05/11/1960 ?MRN: [id number] ? ? ?Date of Service ? 04/30/2021  ?HPI/Events of Note ? Patient with persistent sinus tachycardia ( rate in the 130's), saturation 89 %,  BP within normal limits, stat portable CXR and BNP ordered.  ?eICU Interventions ? See above.  ? ? ? ?  ? ?Kerry Kass Jaye Polidori ?04/30/2021, 6:08 AM ?

## 2021-04-30 NOTE — Progress Notes (Signed)
eLink Physician-Brief Progress Note ?Patient Name: [name] ?DOB: 05-26-1960 ?MRN: [id number] ? ? ?Date of Service ? 04/30/2021  ?HPI/Events of Note ? CXR reviewed, no recurrence of pneumothorax, hypervolemia? BNP result pending.  ?eICU Interventions ? Lasix 20 mg iv x 1 ordered.  ? ? ? ?  ? ?Thomasene Lot Imelda Dandridge ?04/30/2021, 6:45 AM ?

## 2021-04-30 NOTE — Progress Notes (Signed)
eLink Physician-Brief Progress Note ?Patient Name: ZYRIANA BURGARDT ?DOB: 1960-11-22 ?MRN: [id number] ? ? ?Date of Service ? 04/30/2021  ?HPI/Events of Note ? Patient with sinus tachycardia ( rate 130's) likely secondary to a combination of pain and anxiety, she also has insomnia, saturation 87 - 89 % currently and patient is obviously mouth breathing. She  has a cough that is not strong enough to mobilize airway secretions.  ?eICU Interventions ? Bed delivered chest PT is necessary but will be deferred to the AM in order not to disrupt night time rest, will give 3 mg of Melatonin x 1 to promote rest  over night, iv Fentanyl discontinued and replaced with 1000 mg of Tylenol via tube Q 6 hours to optimize pain relief while minimizing over sedation / suppression of respiratory drive, non re-breather mask was added to high flow nasal cannula PRN, to maintain saturation consistently between 88 - 92 %.  ? ? ? ?  ? ?Kerry Kass Liliani Bobo ?04/30/2021, 3:52 AM ?

## 2021-04-30 NOTE — Progress Notes (Signed)
[facility] ADULT ICU REPLACEMENT PROTOCOL ? ? ?The patient does apply for the [facility] Adult ICU Electrolyte Replacment Protocol based on the criteria listed below:  ? ?1.Exclusion criteria: TCTS patients, ECMO patients, and Dialysis patients ?2. Is GFR >/= 30 ml/min? Yes.    ?Patient's GFR today is >60 ?3. Is SCr </= 2? Yes.   ?Patient's SCr is 0.98 mg/dL ?4. Did SCr increase >/= 0.5 in 24 hours? No. ?5.Pt's weight >40kg  Yes.   ?6. Abnormal electrolyte(s):   K 3.2, Mg 1.9    ?7. Electrolytes replaced per protocol ?8.  Call MD STAT for K+ </= 2.5, Phos </= 1, or Mag </= 1 ?Physician:  Wyn Forster ? ?Sedonia Small 04/30/2021 4:54 AM ? ?

## 2021-04-30 NOTE — Progress Notes (Signed)
eLink Physician-Brief Progress Note ?Patient Name: [name] ?DOB: 1960-07-23 ?MRN: [id number] ? ? ?Date of Service ? 04/30/2021  ?HPI/Events of Note ? ABG reviewed.  ?eICU Interventions ? NSR with non-specific ST, T changes.  ? ? ? ?  ? ?Kerry Kass [name] ?04/30/2021, 11:13 PM ?

## 2021-04-30 NOTE — Progress Notes (Signed)
Called elink regarding elevated HR. Maintaining 120's. Increased oxygen needs now on 20L/100% SpO2 88%. Gave fentanyl earlier helped bring HR down to 110, helped with comfort. Patient stable, resting.  ?Oliver Barre, RN ?

## 2021-04-30 NOTE — Progress Notes (Signed)
RT assisted patient with flutter valve. Patient did flutter x10 with good effort. RT encouraged patient to cough after use. Cough unproductive. Current spo2 89-92%. HR 133. RT will continue to monitor as needed. ?

## 2021-05-01 ENCOUNTER — Inpatient Hospital Stay (HOSPITAL_COMMUNITY): Payer: Medicare Other

## 2021-05-01 DIAGNOSIS — R579 Shock, unspecified: Secondary | ICD-10-CM | POA: Diagnosis not present

## 2021-05-01 LAB — CBC
HCT: 28.7 % — ABNORMAL LOW (ref 36.0–46.0)
Hemoglobin: 9.2 g/dL — ABNORMAL LOW (ref 12.0–15.0)
MCH: 29.1 pg (ref 26.0–34.0)
MCHC: 32.1 g/dL (ref 30.0–36.0)
MCV: 90.8 fL (ref 80.0–100.0)
Platelets: 163 10*3/uL (ref 150–400)
RBC: 3.16 MIL/uL — ABNORMAL LOW (ref 3.87–5.11)
RDW: 14.4 % (ref 11.5–15.5)
WBC: 10.4 10*3/uL (ref 4.0–10.5)
nRBC: 0.7 % — ABNORMAL HIGH (ref 0.0–0.2)

## 2021-05-01 LAB — URINE CULTURE: Culture: 40000 — AB

## 2021-05-01 LAB — CULTURE, BLOOD (ROUTINE X 2)
Culture: NO GROWTH
Culture: NO GROWTH
Special Requests: ADEQUATE
Special Requests: ADEQUATE

## 2021-05-01 LAB — BASIC METABOLIC PANEL
Anion gap: 6 (ref 5–15)
BUN: 40 mg/dL — ABNORMAL HIGH (ref 8–23)
CO2: 29 mmol/L (ref 22–32)
Calcium: 8.4 mg/dL — ABNORMAL LOW (ref 8.9–10.3)
Chloride: 118 mmol/L — ABNORMAL HIGH (ref 98–111)
Creatinine, Ser: 0.69 mg/dL (ref 0.44–1.00)
GFR, Estimated: >60 mL/min (ref >60)
Glucose, Bld: 120 mg/dL — ABNORMAL HIGH (ref 70–99)
Potassium: 4.6 mmol/L (ref 3.5–5.1)
Sodium: 153 mmol/L — ABNORMAL HIGH (ref 135–145)

## 2021-05-01 LAB — GLUCOSE, CAPILLARY
Glucose-Capillary: 105 mg/dL — ABNORMAL HIGH (ref 70–99)
Glucose-Capillary: 109 mg/dL — ABNORMAL HIGH (ref 70–99)
Glucose-Capillary: 116 mg/dL — ABNORMAL HIGH (ref 70–99)
Glucose-Capillary: 127 mg/dL — ABNORMAL HIGH (ref 70–99)
Glucose-Capillary: 134 mg/dL — ABNORMAL HIGH (ref 70–99)
Glucose-Capillary: 94 mg/dL (ref 70–99)

## 2021-05-01 LAB — MAGNESIUM: Magnesium: 1.7 mg/dL (ref 1.7–2.4)

## 2021-05-01 LAB — BRAIN NATRIURETIC PEPTIDE: B Natriuretic Peptide: 1283.3 pg/mL — ABNORMAL HIGH (ref 0.0–100.0)

## 2021-05-01 MED ORDER — LORAZEPAM 1 MG PO TABS
1.0000 mg | ORAL_TABLET | Freq: Once | ORAL | Status: AC
  Administered 2021-05-01: 1 mg
  Filled 2021-05-01: qty 1

## 2021-05-01 MED ORDER — WHITE PETROLATUM EX OINT
TOPICAL_OINTMENT | CUTANEOUS | Status: DC | PRN
  Filled 2021-05-01: qty 28.35

## 2021-05-01 MED ORDER — ACETAMINOPHEN 500 MG PO TABS
1000.0000 mg | ORAL_TABLET | Freq: Four times a day (QID) | ORAL | Status: AC | PRN
  Administered 2021-05-01 – 2021-05-02 (×2): 1000 mg
  Filled 2021-05-01 (×2): qty 2

## 2021-05-01 MED ORDER — DEXTROSE 5 % IV BOLUS
250.0000 mL | Freq: Once | INTRAVENOUS | Status: AC
  Administered 2021-05-01: 250 mL via INTRAVENOUS

## 2021-05-01 MED ORDER — BUSPIRONE HCL 10 MG PO TABS
10.0000 mg | ORAL_TABLET | Freq: Three times a day (TID) | ORAL | Status: DC
  Administered 2021-05-01 – 2021-05-13 (×37): 10 mg
  Filled 2021-05-01 (×38): qty 1

## 2021-05-01 MED ORDER — METOPROLOL TARTRATE 5 MG/5ML IV SOLN: 2.5000 mg | Freq: Once | INTRAVENOUS | Status: AC

## 2021-05-01 MED ORDER — OXYCODONE HCL 5 MG PO TABS
5.0000 mg | ORAL_TABLET | Freq: Four times a day (QID) | ORAL | Status: AC | PRN
  Administered 2021-05-01 – 2021-05-02 (×2): 5 mg
  Filled 2021-05-01 (×2): qty 1

## 2021-05-01 MED ORDER — METOPROLOL TARTRATE 5 MG/5ML IV SOLN
INTRAVENOUS | Status: AC
  Administered 2021-05-01: 2.5 mg via INTRAVENOUS
  Filled 2021-05-01: qty 5

## 2021-05-01 MED ORDER — FREE WATER
150.0000 mL | Status: AC
  Administered 2021-05-01 – 2021-05-02 (×6): 150 mL

## 2021-05-01 MED ORDER — GUAIFENESIN-CODEINE 100-10 MG/5ML PO SOLN
5.0000 mL | Freq: Four times a day (QID) | ORAL | Status: DC
  Administered 2021-05-01 – 2021-05-13 (×47): 5 mL
  Filled 2021-05-01 (×47): qty 5

## 2021-05-01 MED ORDER — MELATONIN 3 MG PO TABS
3.0000 mg | ORAL_TABLET | Freq: Once | ORAL | Status: AC
  Administered 2021-05-01: 3 mg via ORAL
  Filled 2021-05-01: qty 1

## 2021-05-01 NOTE — Progress Notes (Signed)
eLink Physician-Brief Progress Note ?Patient Name: [name] ?DOB: January 01, 1961 ?MRN: [id number] ? ? ?Date of Service ? 05/01/2021  ?HPI/Events of Note ? Has needed I/O urinary cath x 3 today. RN asking for foley per protocol  ?eICU Interventions ? Order placed  ? ? ? ?Intervention Category ?Intermediate Interventions: Oliguria - evaluation and management ? ?Jehad Bisono G Jillayne Witte ?05/01/2021, 10:54 PM ?

## 2021-05-01 NOTE — Progress Notes (Signed)
? ?NAME:  [name], MRN:  [id number], DOB:  1960/06/20, LOS: 5 ?ADMISSION DATE:  04/26/2021, CONSULTATION DATE:  04/17 ?REFERRING MD:  EDP, CHIEF COMPLAINT:  Septic shock  ? ?History of Present Illness:  ?This is a 14 yof w/ h/o MS (walks w/ walker at baseline). She started being treated w/ Ocrelizumab back in Feb. Per pt and family about 4 weeks after began to notice increased RLE swelling, scaling and weakness. She attributed this to an expected side effects of the medication. Then about 5-7d prior to presentation she developed increased weakness, worsening right leg pain, poor PO intake, and neck pain. These symptoms continued to worsen to point pain was no longer bearable and she was so weak that she was no longer able to get OOB.  ?  ?In ER she was  hypotensive in 70s, tachycardic, encephalopathic, wbc ct 17.2, CO2 9 w/ + anion gap metabolic acidosis, BUN 643, creatinine 7.81 and K 6.2 ?Pertinent  Medical History  ?MS walks w/ walker, GERD, HLA B27 positive, Pulm nodule, seizure, sacroiliitis, ankylosing spondylitis, fibromyalgia ?Significant Hospital Events: ?Including procedures, antibiotic start and stop dates in addition to other pertinent events   ?4/17 admitted w/ septic shock working dx cellulitis (RLE), Cultures sent, MRI ordered C-spine, CT lower ext ordered, Zosyn and Vanc started  ?04/19-BiPAP placed. Another chest tube placed for persistent pneumothorax.  ?04/20-Lateral chest tube removed.  ? ?Interim History / Subjective:  ?Some confusion ?Generally uncomfortable-denies pain specifically ?No specific complaint-appears agitated ? ?Review of Systems:   ?Negative unless stated in the subjective.  ?Objective   ?Blood pressure 113/85, pulse (!) 137, temperature 98.4 ?F (36.9 ?C), temperature source Oral, resp. rate 20, height 5' 4"  (1.626 m), weight 59.2 kg, SpO2 94 %. ?CVP:  [3 mmHg-14 mmHg] 9 mmHg  ?FiO2 (%):  [90 %-100 %] 100 %  ? ?Intake/Output Summary (Last 24 hours) at 05/01/2021 0759 ?Last  data filed at 05/01/2021 3295 ?Gross per 24 hour  ?Intake 2253.38 ml  ?Output 1515 ml  ?Net 738.38 ml  ?1.7 L urine output and 1 stool output ?Filed Weights  ? 04/29/21 0500 04/30/21 0500 05/01/21 0307  ?Weight: 61.2 kg 59 kg 59.2 kg  ? ?Examination: ?General: Middle-aged lady, increased work of breathing ?HENT: On high flow nasal cannula, dry oral mucosa ?Lungs: Rhonchi bilaterally ?Cardiovascular: S1-S2 appreciated, tachycardic ?Abdomen: Bowel sounds appreciated ?Extremities: No clubbing, no edema.  Lower extremity dressings for cellulitis ?Neuro: Awake, interactive  ?GU: catheter in place.  ? ?Echocardiogram 04/28/2021 reviewed showing normal ejection fraction, mildly reduced right ventricular systolic function ? ? ?Consults  ?Nephrology ?Resolved Hospital Problem list   ?Hyperkalemia s/p IVF and insulin ?Acute metabolic encephalopathy after renal fxn improvement ?Anion gap metabolic acidosis ?Assessment & Plan:  ? ?Sepsis secondary to bilateral lower extremity cellulitis ?Pneumonia ?Metabolic encephalopathy ?-S/p ocrelizumab for MS ?-Lower extremity cellulitis felt to be related to ocrelizumab ?-Off pressors ?-Hydrocortisone was discontinued as it was felt may be causing/contributing to agitation ?-On ceftriaxone ?-Blood cultures 4/17 negative to date ? ?Acute hypoxemic respiratory failure ?-Multifactorial ?-Secondary to underlying obstructive lung disease ?-Secondary to pneumonia ?-Wean oxygen supplementation as tolerated, still requiring high flow oxygen ? ?Acute kidney injury ?-Maintain renal perfusion ?-Avoid nephrotoxic medications ?-BUN/creatinine have stabilized ?-Renal dose medication ? ?Hypernatremia ?-Free water per tube ?-Will give to 50 cc of D5 water ? ?Iatrogenic pneumothorax ?-Chest tube in place ?-Chest x-ray pending this morning ?-No air leak noted this morning ?-Chest tube to remain to suction ?-Chest tube was  placed 04/28/2021 ? ?Multiple sclerosis ?-Was recently started on ocrelizumab in  February 2023 ? ?Chronic obstructive pulmonary disease ?Tobacco use disorder ?Emphysema ?-Bronchodilators ?-Nicotine patch in place ? ?History of cavitary pulmonary nodule left upper lobe ?-Interval decrease in size of nodule ?-Following Dr. Melvyn Novas in the outpatient setting, to follow-up as outpatient ? ?Hyperglycemia ?-Secondary to steroids ?-Continue SSI ? ?Leukocytosis ?-Continues to improve ?-Today will be day 6 of antibiotics ?- Cefepime 4/17.Marland Kitchen ?Zosyn 4/18-19. ?Vancomycin 4/17, 4/19. ?Ceftriaxone 4/20-date. ? ? ?Best Practice (right click and "Reselect all SmartList Selections" daily)  ?Diet/type: renal diet ?DVT prophylaxis: prophylactic heparin  ?GI prophylaxis: N/A ?Lines: N/A ?Foley:  Yes ?Continuous: None ? ?Code Status:  full code ?Last date of multidisciplinary goals of care discussion [04/21 with husband] ?Labs   ?Leukocytosis continues to improve ?BUN/creatinine continues to improve ?CBC: ?Recent Labs  ?Lab 04/27/21 ?0543 04/27/21 ?1452 04/28/21 ?0257 04/28/21 ?0310 04/29/21 ?6203 04/30/21 ?5597 05/01/21 ?0228  ?WBC 15.6*  --   --  12.0* 10.6* 11.4* 10.4  ?HGB 10.6*   < > 8.8* 10.3* 9.8* 9.5* 9.2*  ?HCT 29.4*   < > 26.0* 28.8* 28.4* 28.6* 28.7*  ?MCV 82.6  --   --  82.8 84.8 86.9 90.8  ?PLT 301  --   --  228 142* 162 163  ? < > = values in this interval not displayed.  ? ?Basic Metabolic Panel: ?Recent Labs  ?Lab 04/27/21 ?0543 04/27/21 ?1452 04/27/21 ?1456 04/27/21 ?1508 04/28/21 ?0257 04/28/21 ?0310 04/29/21 ?4163 04/30/21 ?8453 05/01/21 ?0228  ?NA 134*   < > 136   < > 137 136 141 145 153*  ?K 3.9   < > 3.8   < > 3.1* 3.2* 3.1* 3.2* 4.6  ?CL 99  --  103  --   --  101 106 111 118*  ?CO2 19*  --  20*  --   --  22 23 25 29   ?GLUCOSE 151*  --  151*  --   --  109* 154* 144* 120*  ?BUN 78*  --  73*  --   --  62* 59* 51* 40*  ?CREATININE 2.84*  --  2.12*  --   --  1.48* 1.37* 0.98 0.69  ?CALCIUM 7.1*  --  7.4*  --   --  7.8* 8.2* 8.6* 8.4*  ?MG 1.8  --   --   --   --  1.6* 2.4 1.9 1.7  ?PHOS 5.8*  --   --    --   --   --   --   --   --   ? < > = values in this interval not displayed.  ? ?GFR: ?Estimated Creatinine Clearance: 62.2 mL/min (by C-G formula based on SCr of 0.69 mg/dL). ?Recent Labs  ?Lab 04/26/21 ?0930 04/26/21 ?1307 04/27/21 ?0543 04/28/21 ?0310 04/29/21 ?6468 04/30/21 ?0321 05/01/21 ?0228  ?PROCALCITON  --  0.78 0.89  --   --   --   --   ?WBC 17.2*  --  15.6* 12.0* 10.6* 11.4* 10.4  ?LATICACIDVEN 1.4  --   --   --   --   --   --   ? ?Liver Function Tests: ?Recent Labs  ?Lab 04/26/21 ?0930 04/27/21 ?2248 04/28/21 ?0310  ?AST 21 31 39  ?ALT 17 17 21   ?ALKPHOS 84 65 53  ?BILITOT 0.7 0.5 0.6  ?PROT 6.3* 4.8* 4.8*  ?ALBUMIN 3.1* 2.2* 2.1*  ? ?No results for input(s): LIPASE, AMYLASE in the last 168 hours. ?  No results for input(s): AMMONIA in the last 168 hours. ?ABG ?   ?Component Value Date/Time  ? PHART 7.419 04/28/2021 0257  ? PCO2ART 35.2 04/28/2021 0257  ? PO2ART 73 (L) 04/28/2021 0257  ? HCO3 22.8 04/28/2021 0257  ? TCO2 24 04/28/2021 0257  ? ACIDBASEDEF 1.0 04/28/2021 0257  ? O2SAT 95 04/28/2021 0257  ? ?  ?Coagulation Profile: ?Recent Labs  ?Lab 04/26/21 ?0930  ?INR 1.3*  ? ?Cardiac Enzymes: ?Recent Labs  ?Lab 04/26/21 ?1547  ?CKTOTAL 941*  ? ?HbA1C: ?Hgb A1c MFr Bld  ?Date/Time Value Ref Range Status  ?04/26/2021 01:07 PM 5.0 4.8 - 5.6 % Final  ?  Comment:  ?  (NOTE) ?Pre diabetes:          5.7%-6.4% ? ?Diabetes:              >6.4% ? ?Glycemic control for   <7.0% ?adults with diabetes ?  ? ?CBG: ?Recent Labs  ?Lab 04/30/21 ?1508 04/30/21 ?1922 04/30/21 ?2308 05/01/21 ?8115 05/01/21 ?7262  ?GLUCAP 117* 118* 119* 127* 94  ? ?Past Medical History:  ?She,  has a past medical history of Allergy, Anemia, GERD (gastroesophageal reflux disease), HLA B27 (HLA B27 positive), UTI (urinary tract infection), Hyperplastic colon polyp, Hypertension, IBS (irritable bowel syndrome), Multiple sclerosis ([place]), and Seizures ([place]).  ?Surgical History:  ? ?Past Surgical History:  ?Procedure Laterality Date  ? APPENDECTOMY     ? BLADDER SURGERY  2007  ? CESAREAN SECTION    ? x 2   ? ORIF TOE FRACTURE Left 06/27/2013  ? Procedure: OPEN REDUCTION INTERNAL FIXATION (ORIF) LEFT FIFTH METATARSAL;  Surgeon: Wylene Simmer, MD;  Location: [facility]

## 2021-05-01 NOTE — Progress Notes (Addendum)
eLink Physician-Brief Progress Note ?Patient Name: [name] ?DOB: [birth year] ?MRN: [id number] ? ? ?Date of Service ? 05/01/2021  ?HPI/Events of Note ? Sinus tachycardia likely due to a combination of atelectasis, pneumonia and possible hypervolemia, patient has maintained her saturation and blood pressure so far, limited options to address atelectasis , as she cannot fully participate in lung expansion via incentive spirometry / flutter valve, and I'm reluctant to rest her on BIPAP secondary to recent pneumothorax and extensive subcutaneous emphysema, treatment of the sinus tachycardia itself is not indicated, as it is symptomatic of other underlying problems, visually (via camera) she looks more comfortable than she has at any time in the past 48 hours. Serum Na+ is 153 this morning suggesting possible volume depletion, but her BNP yesterday was > 1400.  ?eICU Interventions ? Will check a BNP to assess current volume status, meanwhile will address hypernatremia by ordering free water via her NG tube.  ? ? ? ?  ? ?Kerry Kass Judi Jaffe ?05/01/2021, 3:41 AM ?

## 2021-05-01 NOTE — Progress Notes (Signed)
eLink Physician-Brief Progress Note ?Patient Name: [name] ?DOB: June 24, 1960 ?MRN: [id number] ? ? ?Date of Service ? 05/01/2021  ?HPI/Events of Note ? Bedside RN is requesting some Melatonin for her patient to help her sleep  ?eICU Interventions ? Order placed  ? ? ? ?Intervention Category ?Minor Interventions: Routine modifications to care plan (e.[name]. PRN medications for pain, fever) ? ?[name] [name] ?05/01/2021, 7:54 PM ?

## 2021-05-02 ENCOUNTER — Inpatient Hospital Stay (HOSPITAL_COMMUNITY): Payer: Medicare Other

## 2021-05-02 DIAGNOSIS — R579 Shock, unspecified: Secondary | ICD-10-CM | POA: Diagnosis not present

## 2021-05-02 LAB — GLUCOSE, CAPILLARY
Glucose-Capillary: 126 mg/dL — ABNORMAL HIGH (ref 70–99)
Glucose-Capillary: 149 mg/dL — ABNORMAL HIGH (ref 70–99)
Glucose-Capillary: 134 mg/dL — ABNORMAL HIGH (ref 70–99)
Glucose-Capillary: 119 mg/dL — ABNORMAL HIGH (ref 70–99)
Glucose-Capillary: 120 mg/dL — ABNORMAL HIGH (ref 70–99)
Glucose-Capillary: 160 mg/dL — ABNORMAL HIGH (ref 70–99)

## 2021-05-02 LAB — BASIC METABOLIC PANEL
Anion gap: 7 (ref 5–15)
BUN: 28 mg/dL — ABNORMAL HIGH (ref 8–23)
CO2: 29 mmol/L (ref 22–32)
Calcium: 8.2 mg/dL — ABNORMAL LOW (ref 8.9–10.3)
Chloride: 112 mmol/L — ABNORMAL HIGH (ref 98–111)
Creatinine, Ser: 0.53 mg/dL (ref 0.44–1.00)
GFR, Estimated: >60 mL/min (ref >60)
Glucose, Bld: 115 mg/dL — ABNORMAL HIGH (ref 70–99)
Potassium: 3.9 mmol/L (ref 3.5–5.1)
Sodium: 148 mmol/L — ABNORMAL HIGH (ref 135–145)

## 2021-05-02 LAB — CBC
HCT: 26.9 % — ABNORMAL LOW (ref 36.0–46.0)
Hemoglobin: 8.6 g/dL — ABNORMAL LOW (ref 12.0–15.0)
MCH: 29.8 pg (ref 26.0–34.0)
MCHC: 32 g/dL (ref 30.0–36.0)
MCV: 93.1 fL (ref 80.0–100.0)
Platelets: 139 10*3/uL — ABNORMAL LOW (ref 150–400)
RBC: 2.89 MIL/uL — ABNORMAL LOW (ref 3.87–5.11)
RDW: 14.6 % (ref 11.5–15.5)
WBC: 10.5 10*3/uL (ref 4.0–10.5)
nRBC: 0.4 % — ABNORMAL HIGH (ref 0.0–0.2)

## 2021-05-02 LAB — MAGNESIUM: Magnesium: 1.4 mg/dL — ABNORMAL LOW (ref 1.7–2.4)

## 2021-05-02 MED ORDER — MAGNESIUM SULFATE 4 GM/100ML IV SOLN
4.0000 g | Freq: Once | INTRAVENOUS | Status: AC
  Administered 2021-05-02: 4 g via INTRAVENOUS
  Filled 2021-05-02: qty 100

## 2021-05-02 MED ORDER — SODIUM CHLORIDE 0.9 % IV SOLN: INTRAVENOUS | Status: DC | PRN

## 2021-05-02 MED ORDER — QUETIAPINE FUMARATE 25 MG PO TABS
25.0000 mg | ORAL_TABLET | Freq: Every morning | ORAL | Status: DC
  Administered 2021-05-02 – 2021-05-13 (×12): 25 mg via ORAL
  Filled 2021-05-02 (×12): qty 1

## 2021-05-02 MED ORDER — QUETIAPINE FUMARATE 50 MG PO TABS
50.0000 mg | ORAL_TABLET | Freq: Every day | ORAL | Status: DC
  Administered 2021-05-02 – 2021-05-12 (×11): 50 mg
  Filled 2021-05-02 (×11): qty 1

## 2021-05-02 MED ORDER — MELATONIN 5 MG PO TABS
5.0000 mg | ORAL_TABLET | Freq: Every day | ORAL | Status: DC
  Administered 2021-05-02 – 2021-05-12 (×11): 5 mg
  Filled 2021-05-02 (×11): qty 1

## 2021-05-02 MED ORDER — OXYCODONE HCL 5 MG PO TABS
5.0000 mg | ORAL_TABLET | Freq: Four times a day (QID) | ORAL | Status: DC | PRN
  Administered 2021-05-02 – 2021-05-09 (×13): 5 mg
  Filled 2021-05-02 (×13): qty 1

## 2021-05-02 NOTE — Progress Notes (Signed)
Patient c/o not being able to breath after changing patient's pads and rolling her. Patient on HFNC 100%, 30L. Quickly raised patients HOB and started bagging her. Increased Liters to 35%. RRT called and multiple nurses at bedside. No o2 sats were able to be picked up during this episode. Patient recovered after a couple of minutes and Stat CXR placed. Awaiting results.  O2 sats now 92%. Will continue to monitor patient closely.  ?

## 2021-05-02 NOTE — Progress Notes (Signed)
Speech Language Pathology Treatment: Dysphagia  ?Patient Details ?Name: [name] ?MRN: [id number] ?DOB: 11-May-1960 ?Today's Date: 05/02/2021 ?Time: 4098-1191 ?SLP Time Calculation (min) (ACUTE ONLY): 20 min ? ?Assessment / Plan / Recommendation ?Clinical Impression ? Patient seen by SLP for skilled treatment focusing on dysphagia goals. Per patient's RN, she has been tolerating ice chips and although she has had a cough, this hasn't seemed directly related to ice chips. RN also reported that patient's mouth has been extremely dry. When SLP entered room, patient with eyes closed but she alerted to voice and able to participate. SLP performed oral care via toothette sponges with water and oral rinse. Patient's oral mucosa was very dry in appearance. SpO2 remained at low 90% prior to, during and after PO's. Patient consumed total of 5 small ice chips and was able to effectively masticate and with swallow initiation appearing to be [facility]. Patient did wince some when swallowing and would tap her chest with her hand after swallowing. SLP asked her what this was for and she said "its cold, but good". Patient did report some relief from dry mouth after having ice chips. After consuming 5 ice chips, she declined any more and declined any sips of water. She exhibited confusion, calling out to her husband which she did several times even after SLP informed her that he was not in the room. At this time, patient is still not ready for PO's or objective swallow study as she continues to be weak, lethargic, confused. SLP will continue to follow patient for dysphagia treatment. ?  ?HPI HPI: Patient is a 61 y.o. female with PMH: MS, ANEMIA, GERD, HTN, IBS, seizures. She presented to the hospital c/o weakness and leg pain for 3 weeks. In the ER she was hypotensive, tachycardic and required vasopressors. She recieved 5/2 L fluid overnight and was hyperkalemic, in renal failure and had leukocytosis. CXR in ER showed pneumothorax and  chest tube was placed. She is being treated for Septic shock due to cellulitis in setting of immunocompromised state improved. SLP swallow eval ordered due to RN concern for patient's ability to swallow PO's. ?  ?   ?SLP Plan ? Continue with current plan of care;MBS ? ?  ?  ?Recommendations for follow up therapy are one component of a multi-disciplinary discharge planning process, led by the attending physician.  Recommendations may be updated based on patient status, additional functional criteria and insurance authorization. ?  ? ?Recommendations  ?Diet recommendations: NPO;Other(comment) (ice chips PRN when alert and after oral care) ?Medication Administration: Via alternative means  ?   ?    ?   ? ? ? ? Oral Care Recommendations: Oral care QID;Staff/trained caregiver to provide oral care ?Follow Up Recommendations: Other (comment) (TBD) ?Assistance recommended at discharge: Intermittent Supervision/Assistance ?SLP Visit Diagnosis: Dysphagia, unspecified (R13.10) ?Plan: Continue with current plan of care;MBS ? ? ? ? ?  ?  ?Nancy Nevin, MA, CCC-SLP ?Speech Therapy ? ?

## 2021-05-02 NOTE — Progress Notes (Signed)
[facility] ADULT ICU REPLACEMENT PROTOCOL ? ? ?The patient does apply for the [facility] Adult ICU Electrolyte Replacment Protocol based on the criteria listed below:  ? ?1.Exclusion criteria: TCTS patients, ECMO patients, and Dialysis patients ?2. Is GFR >/= 30 ml/min? Yes.    ?Patient's GFR today is >60 ?3. Is SCr </= 2? Yes.   ?Patient's SCr is 0.53 mg/dL ?4. Did SCr increase >/= 0.5 in 24 hours? No. ?5.Pt's weight >40kg  Yes.   ?6. Abnormal electrolyte(s): Mag  ?7. Electrolytes replaced per protocol ?8.  Call MD STAT for K+ </= 2.5, Phos </= 1, or Mag </= 1 ?Physician:  Jeannene Patella ? ?Tika Hannis E Dudley Cooley 05/02/2021 3:41 AM  ?

## 2021-05-02 NOTE — Progress Notes (Addendum)
? ?NAME:  [name], MRN:  [id number], DOB:  10-Dec-1960, LOS: 6 ?ADMISSION DATE:  04/26/2021, CONSULTATION DATE:  04/17 ?REFERRING MD:  EDP, CHIEF COMPLAINT:  Septic shock  ? ?History of Present Illness:  ?This is a 27 yof w/ h/o MS (walks w/ walker at baseline). She started being treated w/ Ocrelizumab back in Feb. Per pt and family about 4 weeks after began to notice increased RLE swelling, scaling and weakness. She attributed this to an expected side effects of the medication. Then about 5-7d prior to presentation she developed increased weakness, worsening right leg pain, poor PO intake, and neck pain. These symptoms continued to worsen to point pain was no longer bearable and she was so weak that she was no longer able to get OOB.  ?  ?In ER she was  hypotensive in 70s, tachycardic, encephalopathic, wbc ct 17.2, CO2 9 w/ + anion gap metabolic acidosis, BUN 101, creatinine 7.81 and K 6.2 ?Pertinent  Medical History  ?MS walks w/ walker, GERD, HLA B27 positive, Pulm nodule, seizure, sacroiliitis, ankylosing spondylitis, fibromyalgia ?Significant Hospital Events: ?Including procedures, antibiotic start and stop dates in addition to other pertinent events   ?4/17 admitted w/ septic shock working dx cellulitis (RLE), Cultures sent, MRI ordered C-spine, CT lower ext ordered, Zosyn and Vanc started  ?04/19-BiPAP placed. Another chest tube placed for persistent pneumothorax.  ?04/20-Lateral chest tube removed.  ?4/23-still with air leak in chest tube ? ?Interim History / Subjective:  ?Some confusion ?Agitation level appears a little bit better today ? ?Review of Systems:   ?Negative unless stated in the subjective.  ?Objective   ?Blood pressure 94/60, pulse (!) 103, temperature 97.7 ?F (36.5 ?C), temperature source Oral, resp. rate 19, height 5' 4"  (1.626 m), weight 59.1 kg, SpO2 97 %. ?   ?FiO2 (%):  [100 %] 100 %  ? ?Intake/Output Summary (Last 24 hours) at 05/02/2021 0736 ?Last data filed at 05/02/2021 0700 ?Gross  per 24 hour  ?Intake 3025.07 ml  ?Output 1905 ml  ?Net 1120.07 ml  ?1.7 L urine output and 1 stool output ?Filed Weights  ? 04/30/21 0500 05/01/21 0307 05/02/21 0234  ?Weight: 59 kg 59.2 kg 59.1 kg  ? ?Examination: ?General: Middle-age, does not appear to be in distress ?HENT: On high flow nasal cannula, dry oral mucosa ?Lungs: Bilateral rhonchi ?Cardiovascular: S1-S2 appreciated, degree of tachycardia a little bit better ?Abdomen: Bowel sounds appreciated ?Extremities: No clubbing, no edema.  Lower extremity dressings for cellulitis ?Neuro: Awake, interactive  ?GU: catheter in place.  ? ?Echocardiogram 04/28/2021 reviewed showing normal ejection fraction, mildly reduced right ventricular systolic function ? ? ?Consults  ?Nephrology ?Resolved Hospital Problem list   ?Hyperkalemia s/p IVF and insulin ?Acute metabolic encephalopathy after renal fxn improvement ?Anion gap metabolic acidosis ?Assessment & Plan:  ? ?Sepsis secondary to bilateral lower extremity cellulitis ?Pneumonia ?Metabolic encephalopathy ?-S/p ocrelizumab for MS ?-Lower extremity cellulitis felt to be related to ocrelizumab ?-Off pressors ?-Off hydrocortisone ?-On ceftriaxone-will plan for 10 days total ?-Blood cultures 4/17 negative to date ? ?Acute hypoxemic respiratory failure ?-Multifactorial ?-Secondary to underlying obstructive lung disease ?-Secondary to pneumonia ?-Continue to wean oxygen supplementation as tolerated, still requiring high flow oxygen ? ?Acute kidney injury ?-Maintain renal perfusion ?-Avoid nephrotoxic medications ?-Renal dose medications ? ?Hypernatremia ?-Free water per tube ?-Improving ? ?Iatrogenic pneumothorax ?-Chest tube remains in place ?-Chest tube still with air leak today, most recent chest x-ray shows no pneumothorax ?-Continue chest tube to suction ? ?Chronic obstructive pulmonary  disease ?Tobacco use disorder ?Emphysema ?-Bronchodilators ?-Nicotine patch in place ? ?History of cavitary pulmonary nodule, left  upper lobe ?-Interval decrease in size of nodule ?-Following up with Dr. Melvyn Novas in the outpatient setting, to follow-up as outpatient ? ?Hyperglycemia ?-Continue SSI ? ?Leukocytosis ?-Continues to improve ?-Today will be day 7 of antibiotics ?- Cefepime 4/17.Marland Kitchen ?Zosyn 4/18-19. ?Vancomycin 4/17, 4/19. ?Ceftriaxone 4/20-date. ? ?Attempting to optimize medication for agitation/anxiety ?-Seroquel added ?-Increased BuSpar ?-Maintain pain medications ? ?Chest x-ray, labs ordered for a.m. ? ?Best Practice (right click and "Reselect all SmartList Selections" daily)  ?Diet/type: renal diet ?DVT prophylaxis: prophylactic heparin  ?GI prophylaxis: N/A ?Lines: N/A ?Foley:  Yes ?Continuous: None ? ?Code Status:  full code ?Last date of multidisciplinary goals of care discussion [04/21 with husband] ?Labs   ?Leukocytosis continues to improve ?BUN/creatinine continues to improve ?CBC: ?Recent Labs  ?Lab 04/28/21 ?0310 04/29/21 ?4332 04/30/21 ?9518 05/01/21 ?8416 05/02/21 ?6063  ?WBC 12.0* 10.6* 11.4* 10.4 10.5  ?HGB 10.3* 9.8* 9.5* 9.2* 8.6*  ?HCT 28.8* 28.4* 28.6* 28.7* 26.9*  ?MCV 82.8 84.8 86.9 90.8 93.1  ?PLT 228 142* 162 163 139*  ? ?Basic Metabolic Panel: ?Recent Labs  ?Lab 04/27/21 ?0543 04/27/21 ?1452 04/28/21 ?0310 04/29/21 ?0160 04/30/21 ?1093 05/01/21 ?2355 05/02/21 ?7322  ?NA 134*   < > 136 141 145 153* 148*  ?K 3.9   < > 3.2* 3.1* 3.2* 4.6 3.9  ?CL 99   < > 101 106 111 118* 112*  ?CO2 19*   < > 22 23 25 29 29   ?GLUCOSE 151*   < > 109* 154* 144* 120* 115*  ?BUN 78*   < > 62* 59* 51* 40* 28*  ?CREATININE 2.84*   < > 1.48* 1.37* 0.98 0.69 0.53  ?CALCIUM 7.1*   < > 7.8* 8.2* 8.6* 8.4* 8.2*  ?MG 1.8  --  1.6* 2.4 1.9 1.7 1.4*  ?PHOS 5.8*  --   --   --   --   --   --   ? < > = values in this interval not displayed.  ? ?GFR: ?Estimated Creatinine Clearance: 62.2 mL/min (by C-G formula based on SCr of 0.53 mg/dL). ?Recent Labs  ?Lab 04/26/21 ?0930 04/26/21 ?1307 04/27/21 ?0543 04/28/21 ?0310 04/29/21 ?0254 04/30/21 ?2706  05/01/21 ?2376 05/02/21 ?2831  ?PROCALCITON  --  0.78 0.89  --   --   --   --   --   ?WBC 17.2*  --  15.6*   < > 10.6* 11.4* 10.4 10.5  ?LATICACIDVEN 1.4  --   --   --   --   --   --   --   ? < > = values in this interval not displayed.  ? ?Liver Function Tests: ?Recent Labs  ?Lab 04/26/21 ?0930 04/27/21 ?5176 04/28/21 ?0310  ?AST 21 31 39  ?ALT 17 17 21   ?ALKPHOS 84 65 53  ?BILITOT 0.7 0.5 0.6  ?PROT 6.3* 4.8* 4.8*  ?ALBUMIN 3.1* 2.2* 2.1*  ? ?No results for input(s): LIPASE, AMYLASE in the last 168 hours. ?No results for input(s): AMMONIA in the last 168 hours. ?ABG ?   ?Component Value Date/Time  ? PHART 7.419 04/28/2021 0257  ? PCO2ART 35.2 04/28/2021 0257  ? PO2ART 73 (L) 04/28/2021 0257  ? HCO3 22.8 04/28/2021 0257  ? TCO2 24 04/28/2021 0257  ? ACIDBASEDEF 1.0 04/28/2021 0257  ? O2SAT 95 04/28/2021 0257  ? ?  ?Coagulation Profile: ?Recent Labs  ?Lab 04/26/21 ?0930  ?INR 1.3*  ? ?  Cardiac Enzymes: ?Recent Labs  ?Lab 04/26/21 ?1547  ?CKTOTAL 941*  ? ?HbA1C: ?Hgb A1c MFr Bld  ?Date/Time Value Ref Range Status  ?04/26/2021 01:07 PM 5.0 4.8 - 5.6 % Final  ?  Comment:  ?  (NOTE) ?Pre diabetes:          5.7%-6.4% ? ?Diabetes:              >6.4% ? ?Glycemic control for   <7.0% ?adults with diabetes ?  ? ?CBG: ?Recent Labs  ?Lab 05/01/21 ?1104 05/01/21 ?1507 05/01/21 ?1908 05/01/21 ?2338 05/02/21 ?0316  ?GLUCAP 109* 105* 116* 134* 126*  ? ?Past Medical History:  ?She,  has a past medical history of Allergy, Anemia, GERD (gastroesophageal reflux disease), HLA B27 (HLA B27 positive), UTI (urinary tract infection), Hyperplastic colon polyp, Hypertension, IBS (irritable bowel syndrome), Multiple sclerosis ([place]), and Seizures ([place]).  ?Surgical History:  ? ?Past Surgical History:  ?Procedure Laterality Date  ? APPENDECTOMY    ? BLADDER SURGERY  2007  ? CESAREAN SECTION    ? x 2   ? ORIF TOE FRACTURE Left 06/27/2013  ? Procedure: OPEN REDUCTION INTERNAL FIXATION (ORIF) LEFT FIFTH METATARSAL;  Surgeon: Wylene Simmer, MD;   Location: [facility];  Service: Orthopedics;  Laterality: Left;  ? PARTIAL HYSTERECTOMY  2006  ? TONSILLECTOMY    ? TUBAL LIGATION    ?  ?Social History:  ? reports that she has been smoking cigarettes.

## 2021-05-02 NOTE — Progress Notes (Addendum)
eLink Physician-Brief Progress Note ?Patient Name: [name] ?DOB: 07-08-1960 ?MRN: [id number] ? ? ?Date of Service ? 05/02/2021  ?HPI/Events of Note ? Called for desaturation. Seen on camera. RN reported patient was on 100%/30 liter HFNC when she came on shift. While being repositioned she was made to lay flat and desaturated and took a while to improve and they needed to bag her. She has a chest tube which is unchanged and on suction. When I saw her, o2 sat was 95 and RR was 20. HR was in 120s and BP was stable. She is alert and RN notes much better than before.   ?eICU Interventions ? Will get a CXR .   ? ? ? ?Intervention Category ?Major Interventions: Respiratory failure - evaluation and management ? ?[name] ?05/02/2021, 8:39 PM ? ?Addendum at 9:10 pm ?Reviewed CXR . Has also just now been read by radiology. Nothing acute in there. Possibly she had a small plug which dislodged and is doing ok now.  ?

## 2021-05-03 ENCOUNTER — Inpatient Hospital Stay (HOSPITAL_COMMUNITY): Payer: Medicare Other

## 2021-05-03 DIAGNOSIS — R579 Shock, unspecified: Secondary | ICD-10-CM | POA: Diagnosis not present

## 2021-05-03 LAB — CBC WITH DIFFERENTIAL/PLATELET
Abs Immature Granulocytes: 0.26 10*3/uL — ABNORMAL HIGH (ref 0.00–0.07)
Basophils Absolute: 0 10*3/uL (ref 0.0–0.1)
Basophils Relative: 0 %
Eosinophils Absolute: 0.2 10*3/uL (ref 0.0–0.5)
Eosinophils Relative: 2 %
HCT: 26.3 % — ABNORMAL LOW (ref 36.0–46.0)
Hemoglobin: 8 g/dL — ABNORMAL LOW (ref 12.0–15.0)
Immature Granulocytes: 3 %
Lymphocytes Relative: 3 %
Lymphs Abs: 0.2 10*3/uL — ABNORMAL LOW (ref 0.7–4.0)
MCH: 29 pg (ref 26.0–34.0)
MCHC: 30.4 g/dL (ref 30.0–36.0)
MCV: 95.3 fL (ref 80.0–100.0)
Monocytes Absolute: 0.4 10*3/uL (ref 0.1–1.0)
Monocytes Relative: 5 %
Neutro Abs: 7.6 10*3/uL (ref 1.7–7.7)
Neutrophils Relative %: 87 %
Platelets: 136 10*3/uL — ABNORMAL LOW (ref 150–400)
RBC: 2.76 MIL/uL — ABNORMAL LOW (ref 3.87–5.11)
RDW: 14.7 % (ref 11.5–15.5)
WBC: 8.7 10*3/uL (ref 4.0–10.5)
nRBC: 0.3 % — ABNORMAL HIGH (ref 0.0–0.2)

## 2021-05-03 LAB — GLUCOSE, CAPILLARY
Glucose-Capillary: 117 mg/dL — ABNORMAL HIGH (ref 70–99)
Glucose-Capillary: 120 mg/dL — ABNORMAL HIGH (ref 70–99)
Glucose-Capillary: 125 mg/dL — ABNORMAL HIGH (ref 70–99)
Glucose-Capillary: 128 mg/dL — ABNORMAL HIGH (ref 70–99)
Glucose-Capillary: 132 mg/dL — ABNORMAL HIGH (ref 70–99)
Glucose-Capillary: 136 mg/dL — ABNORMAL HIGH (ref 70–99)

## 2021-05-03 LAB — BASIC METABOLIC PANEL
Anion gap: 5 (ref 5–15)
BUN: 28 mg/dL — ABNORMAL HIGH (ref 8–23)
CO2: 29 mmol/L (ref 22–32)
Calcium: 8 mg/dL — ABNORMAL LOW (ref 8.9–10.3)
Chloride: 114 mmol/L — ABNORMAL HIGH (ref 98–111)
Creatinine, Ser: 0.59 mg/dL (ref 0.44–1.00)
GFR, Estimated: >60 mL/min (ref >60)
Glucose, Bld: 146 mg/dL — ABNORMAL HIGH (ref 70–99)
Potassium: 4.7 mmol/L (ref 3.5–5.1)
Sodium: 148 mmol/L — ABNORMAL HIGH (ref 135–145)

## 2021-05-03 LAB — MAGNESIUM: Magnesium: 2.1 mg/dL (ref 1.7–2.4)

## 2021-05-03 MED ORDER — FUROSEMIDE 10 MG/ML IJ SOLN
40.0000 mg | Freq: Once | INTRAMUSCULAR | Status: AC
  Administered 2021-05-03: 40 mg via INTRAVENOUS
  Filled 2021-05-03: qty 4

## 2021-05-03 MED ORDER — ALBUTEROL SULFATE (2.5 MG/3ML) 0.083% IN NEBU
2.5000 mg | INHALATION_SOLUTION | RESPIRATORY_TRACT | Status: DC | PRN
  Administered 2021-05-03 – 2021-05-08 (×3): 2.5 mg via RESPIRATORY_TRACT
  Filled 2021-05-03 (×3): qty 3

## 2021-05-03 MED ORDER — ALBUTEROL SULFATE (2.5 MG/3ML) 0.083% IN NEBU
INHALATION_SOLUTION | RESPIRATORY_TRACT | Status: AC
  Filled 2021-05-03: qty 3

## 2021-05-03 MED ORDER — BETHANECHOL CHLORIDE 10 MG PO TABS
10.0000 mg | ORAL_TABLET | Freq: Three times a day (TID) | ORAL | Status: DC
  Administered 2021-05-03 (×3): 10 mg via ORAL
  Filled 2021-05-03 (×3): qty 1

## 2021-05-03 MED ORDER — FREE WATER
200.0000 mL | Freq: Four times a day (QID) | Status: DC
  Administered 2021-05-03 – 2021-05-04 (×3): 200 mL

## 2021-05-03 NOTE — TOC Initial Note (Addendum)
Transition of Care (TOC) - Initial/Assessment Note  ? ? ?Patient Details  ?Name: [name] ?MRN: [id number] ?Date of Birth: Jul 07, 1960 ? ?Transition of Care [facility]) CM/SW Contact:    ?Joanne Chars, LCSW ?Phone Number: ?05/03/2021, 2:57 PM ? ?Clinical Narrative:    CSW met with pt, husband Chriss Czar, daughter Maxcine Ham to discuss referral for LTAC.  Permission given to speak with husband and daughter present.  CSW explained LTAC, discussed choice, they would be more interested in [facility] due to its location.  They do have some questions regarding this level of care, CSW informed them will have Jen from [facility] review the chart and make contact.    ? ?CSW spoke with Lorenza Burton at [facility] and she will reach out to the family and answer questions.            ? ? ?Expected Discharge Plan: [place] (LTAC) ?Barriers to Discharge: Other (must enter comment) (acceptance and insurance approval for LTAC) ? ? ?Patient Goals and CMS Choice ?Patient states their goals for this hospitalization and ongoing recovery are:: get back to being independent ?  ?Choice offered to / list presented to : Patient, Spouse ? ?Expected Discharge Plan and Services ?Expected Discharge Plan: [place] (LTAC) ?In-house Referral: Clinical Social Work ?  ?Post Acute Care Choice: Long Term Acute Care (LTAC) ?Living arrangements for the past 2 months: [place] ?                ?  ?  ?  ?  ?  ?  ?  ?  ?  ?  ? ?Prior Living Arrangements/Services ?Living arrangements for the past 2 months: [place] ?Lives with:: Spouse ?Patient language and need for interpreter reviewed:: Yes ?Do you feel safe going back to the place where you live?: Yes      ?Need for Family Participation in Patient Care: No (Comment) ?Care giver support system in place?: Yes (comment) ?Current home services: Other (comment) (none) ?Criminal Activity/Legal Involvement Pertinent to Current Situation/Hospitalization: No - Comment as needed ? ?Activities  of Daily Living ?Home Assistive Devices/Equipment: None ?ADL Screening (condition at time of admission) ?Patient's cognitive ability adequate to safely complete daily activities?: No ?Is the patient deaf or have difficulty hearing?: No ?Does the patient have difficulty seeing, even when wearing glasses/contacts?: No ?Does the patient have difficulty concentrating, remembering, or making decisions?: Yes ?Patient able to express need for assistance with ADLs?: No ?Does the patient have difficulty dressing or bathing?: Yes ?Independently performs ADLs?: No ?Communication: Appropriate for developmental age ?Dressing (OT): Needs assistance ?Is this a change from baseline?: Change from baseline, expected to last >3 days ?Grooming: Needs assistance ?Is this a change from baseline?: Change from baseline, expected to last >3 days ?Feeding: Needs assistance ?Is this a change from baseline?: Change from baseline, expected to last >3 days ?Bathing: Needs assistance ?Is this a change from baseline?: Change from baseline, expected to last >3 days ?Toileting: Needs assistance ?Is this a change from baseline?: Change from baseline, expected to last >3days ?In/Out Bed: Needs assistance ?Is this a change from baseline?: Change from baseline, expected to last >3 days ?Walks in Home: Needs assistance ?Is this a change from baseline?: Change from baseline, expected to last >3 days ?Does the patient have difficulty walking or climbing stairs?: Yes ?Weakness of Legs: Both ?Weakness of Arms/Hands: Both ? ?Permission Sought/Granted ?Permission sought to share information with : Family Supports ?Permission granted to share  information with : Yes, Verbal Permission Granted ? Share Information with NAME: husband Chriss Czar, daughter Maxcine Ham ?   ?   ?   ? ?Emotional Assessment ?Appearance:: Appears stated age ?Attitude/Demeanor/Rapport: Engaged ?Affect (typically observed): Appropriate, Pleasant ?Orientation: : Oriented to Self, Oriented to Place,  Oriented to Situation ?Alcohol / Substance Use: Not Applicable ?Psych Involvement: No (comment) ? ?Admission diagnosis:  Shock ([place]) [R57.9] ?Cellulitis of right lower extremity [L03.115] ?Patient Active Problem List  ? Diagnosis Date Noted  ? HLA B27 (HLA B27 positive) 04/26/2021  ? Shock ([place]) 04/26/2021  ? Cellulitis of both lower extremities 04/26/2021  ? High anion gap metabolic acidosis 47/58/3074  ? Hyperkalemia 04/26/2021  ? Acute metabolic encephalopathy 60/02/9845  ? Pneumothorax on right 04/26/2021  ? Anemia   ? GERD (gastroesophageal reflux disease)   ? Seizures ([place])   ? Pulmonary nodule   ? MS (multiple sclerosis) ([place])   ? AKI (acute kidney injury) ([place])   ? Septic shock ([place])   ? COPD  GOLD ? / still smoking  11/02/2020  ? Multiple pulmonary nodules determined by computed tomography of lung 11/02/2020  ? Seizure disorder ([place]) 09/27/2012  ? ?PCP:  [facility] [place] ?Pharmacy:   ?[id number] [place], [place]?[id number]?Phone: [phone]: [phone]? ? ? ? ?Social Determinants of Health (SDOH) Interventions ?  ? ?Readmission Risk Interventions ?   ? View : No data to display.  ?  ?  ?  ? ? ? ?

## 2021-05-03 NOTE — Progress Notes (Signed)
SLP Cancellation Note ? ?Patient Details ?Name: [name] ?MRN: [id number] ?DOB: 1960-12-11 ? ? ?Cancelled treatment:       Reason Eval/Treat Not Completed: Medical issues which prohibited therapy. Pt is tolerating ice chips per RN and SLP yesterday, but is not yet improving from a respiratory standpoint for an MBS or increased oral intake. Pt will need to wean down from HFNC O2 levels for swallow study. Await readiness.  ? ?Harlon Ditty, MA CCC-SLP  ?Acute Rehabilitation Services ?Secure Chat Preferred ?Office [phone] ? ?Toryn Dewalt, Riley Nearing ?05/03/2021, 9:04 AM ?

## 2021-05-03 NOTE — Evaluation (Signed)
Physical Therapy Evaluation ?Patient Details ?Name: [name] ?MRN: [id number] ?DOB: 01-04-1961 ?Today's Date: 05/03/2021 ? ?History of Present Illness ? Pt adm 4/17 with septic shock due to RLE cellulitis and PNA. Pt developed acute hypoxic respiratory failure and placed on bipap 4/19. Developed iatrogenic pneumothorax and chest tube placed. PMH - Multiple Sclerosis, seizure, ankylosing spondylitis, fibromyalgia  ?Clinical Impression ? Pt presents to PT with significant decline in functional mobility due to profound weakness, decr activity tolerance, and pain in BLE's. Pt appears to have little reserve and likely progress will be slow and steady. Recommend LTACH if respiratory symptoms persist.    ?   ? ?Recommendations for follow up therapy are one component of a multi-disciplinary discharge planning process, led by the attending physician.  Recommendations may be updated based on patient status, additional functional criteria and insurance authorization. ? ?Follow Up Recommendations PT at Long-term acute care hospital ? ?  ?Assistance Recommended at Discharge Frequent or constant Supervision/Assistance  ?Patient can return home with the following ? Two people to help with walking and/or transfers;Two people to help with bathing/dressing/bathroom;Assistance with cooking/housework;Assist for transportation;Help with stairs or ramp for entrance ? ?  ?Equipment Recommendations Hospital bed;Wheelchair (measurements PT);Wheelchair cushion (measurements PT)  ?Recommendations for Other Services ? OT consult  ?  ?Functional Status Assessment Patient has had a recent decline in their functional status and demonstrates the ability to make significant improvements in function in a reasonable and predictable amount of time.  ? ?  ?Precautions / Restrictions Precautions ?Precautions: Fall;Other (comment) ?Precaution Comments: watch SpO2  ? ?  ? ?Mobility ? Bed Mobility ?  ?  ?  ?  ?  ?  ?  ?General bed mobility comments:  Not attempted with assist of 1 person ?  ? ?Transfers ?  ?  ?  ?  ?  ?  ?  ?  ?  ?  ?  ? ?Ambulation/Gait ?  ?  ?  ?  ?  ?  ?  ?  ? ?Stairs ?  ?  ?  ?  ?  ? ?Wheelchair Mobility ?  ? ?Modified Rankin (Stroke Patients Only) ?  ? ?  ? ?Balance   ?  ?  ?  ?  ?  ?  ?  ?  ?  ?  ?  ?  ?  ?  ?  ?  ?  ?  ?   ? ? ? ?Pertinent Vitals/Pain Pain Assessment ?Pain Assessment: 0-10 ?Pain Score: 8  ?Pain Location: BLE ?Pain Descriptors / Indicators: Aching, Throbbing ?Pain Intervention(s): Limited activity within patient's tolerance, Monitored during session, Repositioned  ? ? ?Home Living Family/patient expects to be discharged to:: Private residence ?Living Arrangements: Spouse/significant other;Children ?Available Help at Discharge: Family;Available PRN/intermittently ?Type of Home: House ?Home Access: Stairs to enter ?Entrance Stairs-Rails: Right ?Entrance Stairs-Number of Steps: 3-5 ?  ?Home Layout: One level ?Home Equipment: Rollator (4 wheels);Cane - single point;Shower seat;Grab bars - tub/shower ?   ?  ?Prior Function Prior Level of Function : Needs assist ?  ?  ?  ?Physical Assist : ADLs (physical) ?  ?ADLs (physical): IADLs ?Mobility Comments: Uses rollator or cane at times ?  ?  ? ? ?Hand Dominance  ? Dominant Hand: Right ? ?  ?Extremity/Trunk Assessment  ? Upper Extremity Assessment ?Upper Extremity Assessment: Defer to OT evaluation ?  ? ?Lower Extremity Assessment ?Lower Extremity Assessment: RLE deficits/detail;LLE deficits/detail ?RLE Deficits / Details: Decr dorsiflexion PROM. Strength grossly 2-/5 ?  LLE Deficits / Details: Strength grossly 2-/5 ?  ? ?   ?Communication  ? Communication: No difficulties  ?Cognition Arousal/Alertness: Awake/alert ?Behavior During Therapy: [facility] for tasks assessed/performed ?Overall Cognitive Status: Impaired/Different from baseline ?Area of Impairment: Orientation, Attention, Following commands, Awareness, Problem solving ?  ?  ?  ?  ?  ?  ?  ?  ?Orientation Level: Disoriented to,  Situation ?Current Attention Level: [facility] ?  ?Following Commands: Follows one step commands consistently, Follows one step commands with increased time ?  ?Awareness: Emergent ?Problem Solving: Slow processing ?  ?  ?  ? ?  ?General Comments General comments (skin integrity, edema, etc.): Pt on 35L HHFNC 100% with SpO2 >95% ? ?  ?Exercises General Exercises - Upper Extremity ?Shoulder Flexion: AAROM, Both, 10 reps, Supine ?General Exercises - Lower Extremity ?Ankle Circles/Pumps: AAROM, Both, 10 reps, Supine ?Short Arc Quad: AROM, Both, 10 reps, Supine ?Heel Slides: AAROM, Both, 10 reps, Supine ?Hip ABduction/ADduction: AAROM, Both, 10 reps, Supine  ? ?Assessment/Plan  ?  ?PT Assessment Patient needs continued PT services  ?PT Problem List Decreased strength;Decreased range of motion;Decreased activity tolerance;Decreased balance;Decreased mobility;Cardiopulmonary status limiting activity;Pain ? ?   ?  ?PT Treatment Interventions DME instruction;Gait training;Functional mobility training;Therapeutic activities;Therapeutic exercise;Balance training;Patient/family education   ? ?PT Goals (Current goals can be found in the Care Plan section)  ?Acute Rehab PT Goals ?Patient Stated Goal: To be able to get around at home like she was ?PT Goal Formulation: With patient ?Time For Goal Achievement: 05/17/21 ?Potential to Achieve Goals: Fair ? ?  ?Frequency Min 3X/week ?  ? ? ?Co-evaluation   ?  ?  ?  ?  ? ? ?  ?AM-PAC PT "6 Clicks" Mobility  ?Outcome Measure Help needed turning from your back to your side while in a flat bed without using bedrails?: Total ?Help needed moving from lying on your back to sitting on the side of a flat bed without using bedrails?: Total ?Help needed moving to and from a bed to a chair (including a wheelchair)?: Total ?Help needed standing up from a chair using your arms (e.g., wheelchair or bedside chair)?: Total ?Help needed to walk in hospital room?: Total ?Help needed climbing 3-5 steps  with a railing? : Total ?6 Click Score: 6 ? ?  ?End of Session Equipment Utilized During Treatment: Oxygen ?Activity Tolerance: Patient limited by fatigue ?Patient left: in bed;with call bell/phone within reach ?  ?PT Visit Diagnosis: Other abnormalities of gait and mobility (R26.89);Muscle weakness (generalized) (M62.81);Pain ?Pain - Right/Left:  (bilateral) ?Pain - part of body: Leg ?  ? ?Time: 3235-5732 ?PT Time Calculation (min) (ACUTE ONLY): 20 min ? ? ?Charges:   PT Evaluation ?$PT Eval Moderate Complexity: 1 Mod ?  ?  ?   ? ? ?[facility] PT ?Acute Rehabilitation Services ?Office [phone] ? ? ?Angelina Ok [facility] ?05/03/2021, 11:16 AM ? ?

## 2021-05-03 NOTE — Progress Notes (Addendum)
? ?NAME:  [name], MRN:  [id number], DOB:  [birth year], LOS: 7 ?ADMISSION DATE:  04/26/2021, CONSULTATION DATE:  04/17 ?REFERRING MD:  EDP, CHIEF COMPLAINT:  Septic shock  ? ?History of Present Illness:  ?This is a 32 yof w/ h/o MS (walks w/ walker at baseline). She started being treated w/ Ocrelizumab back in Feb. Per pt and family about 4 weeks after began to notice increased RLE swelling, scaling and weakness. She attributed this to an expected side effects of the medication. Then about 5-7d prior to presentation she developed increased weakness, worsening right leg pain, poor PO intake, and neck pain. These symptoms continued to worsen to point pain was no longer bearable and she was so weak that she was no longer able to get OOB.  ?  ?In ER she was  hypotensive in 70s, tachycardic, encephalopathic, wbc ct 17.2, CO2 9 w/ + anion gap metabolic acidosis, BUN 201, creatinine 7.81 and K 6.2 ?Pertinent  Medical History  ?MS walks w/ walker, GERD, HLA B27 positive, Pulm nodule, seizure, sacroiliitis, ankylosing spondylitis, fibromyalgia ?Significant Hospital Events: ?Including procedures, antibiotic start and stop dates in addition to other pertinent events   ?4/17 admitted w/ septic shock working dx cellulitis (RLE), Cultures sent, MRI ordered C-spine, CT lower ext ordered, Zosyn and Vanc started  ?04/19-BiPAP placed. Another chest tube placed for persistent pneumothorax.  ?04/20-Lateral chest tube removed.  ?4/23-still with air leak in chest tube ? ?Interim History / Subjective:  ?Desaturation overnight. CXR obtained but no acute findings. No acute concerns voiced. Appears more oriented this am. On HFNC, required 35 L briefly. Was on nonrebreather and HFNC upon exam. The non-rebreather was removed and patient still stating well.  ? ?Review of Systems:   ?Negative unless stated in the subjective.  ?Objective   ?Blood pressure 133/87, pulse (!) 126, temperature 99.3 ?F (37.4 ?C), temperature source Oral, resp.  rate (!) 29, height 5' 4"  (1.626 m), weight 59.1 kg, SpO2 100 %. ?   ?FiO2 (%):  [85 %-100 %] 100 %  ? ?Intake/Output Summary (Last 24 hours) at 05/03/2021 0710 ?Last data filed at 05/03/2021 0071 ?Gross per 24 hour  ?Intake 2164.61 ml  ?Output 1230 ml  ?Net 934.61 ml  ? ?1.2 L urine output and 1 stool output ?Filed Weights  ? 05/01/21 0307 05/02/21 0234 05/03/21 0127  ?Weight: 59.2 kg 59.1 kg 59.1 kg  ? ?Examination: ?General: NAD ?HENT:NCAT, [place] present.  ?Lungs: Rhonchi and wheeze present present ?Cardiovascular: Tachycardic, 2+ pulses ?Abdomen: normal bowel sounds, no TTP ?Extremities: bilateral LE bandaged ?Neuro: alert and oriented x4.  ?GU: catheter in placed.  ? ?Diagnostics: ?Mag wnl. WBC normal. Hgb at 8.0. Platletets low but stable. AKI resolved.  ?CXR done and showed no acute findings. Potential increased congestion.  ? ?Consults  ?Nephrology ?Resolved Hospital Problem list   ?Hyperkalemia s/p IVF and insulin ?Acute metabolic encephalopathy after renal fxn improvement ?AKI ?Leukocytosis ?Assessment & Plan:  ?Sepsis secondary to bilateral lower extremity cellulitis ?Pneumonia ?Metabolic encephalopathy ?Leukocytosis ?-S/p ocrelizumab for MS as outpatient therapy.  ?-Lower extremity cellulitis felt to be related to ocrelizumab ?-Off pressors ?-Off hydrocortisone ?-On ceftriaxone-will plan for 10 days total (day 8/10) ?-Blood cultures 4/17 negative to date ?-Leukocytosis resolved. Today will be day 8 of antibiotics. Plan for total 10 days.  ?- Cefepime 4/17. ?Zosyn 4/18-19. ?Vancomycin 4/17, 4/19. ?Ceftriaxone 4/20-date. ? ? ?Acute hypoxemic respiratory failure  ?-Multifactorial, maybe volume overloaded as she is net positive. Will diurese with 40 mg of  IV lasix.  ?-Secondary to underlying obstructive lung disease ?-Secondary to pneumonia ?-Continue to wean oxygen supplementation as tolerated, still requiring high flow oxygen ? ?Hypernatremia ?Improved. Previously on free water flushes. CTM.  ? ?Iatrogenic  pneumothorax ?-Chest tube remains in place ?-Chest tube still with air leak today, most recent chest x-ray shows no pneumothorax ?-Continue chest tube to suction. Decreased pressure this am so will repeat CXR this afternoon.  ? ?Chronic obstructive pulmonary disease ?Tobacco use disorder ?Emphysema ?-Duoneb nebs q 6hrs ?-Nicotine patch in place ? ?Hyperglycemia ?-CGB within range. Continue SSI ? ?Agitation/anxiety ?-Continue seroquel 25 mg morning and 50 mg at bedtime.  ?-Continue BuSpar 10 mg TID ?-Gabapentin 300 mg BID.  ?-Maintain pain medications ? ?History of cavitary pulmonary nodule, left upper lobe ?-Interval decrease in size of nodule. No PFTs at baseline.  ?-Following up with Dr. Melvyn Novas in the outpatient setting, to follow-up as outpatient ? ? ?Best Practice (right click and "Reselect all SmartList Selections" daily)  ?Diet/type: NPO with ice chips ?DVT prophylaxis: prophylactic heparin  ?GI prophylaxis: N/A ?Lines: N/A ?Foley:  Yes ?Continuous: None ? ?Code Status:  full code ?Last date of multidisciplinary goals of care discussion. Will update family today.  ?Labs   ?Mag wnl. WBC normal. Hgb at 8.0. Platletets low but stable. AKI resolved.  ? ?Past Medical History:  ?She,  has a past medical history of Allergy, Anemia, GERD (gastroesophageal reflux disease), HLA B27 (HLA B27 positive), UTI (urinary tract infection), Hyperplastic colon polyp, Hypertension, IBS (irritable bowel syndrome), Multiple sclerosis ([place]), and Seizures ([place]).  ?Surgical History:  ? ?Past Surgical History:  ?Procedure Laterality Date  ? APPENDECTOMY    ? BLADDER SURGERY  2007  ? CESAREAN SECTION    ? x 2   ? ORIF TOE FRACTURE Left 06/27/2013  ? Procedure: OPEN REDUCTION INTERNAL FIXATION (ORIF) LEFT FIFTH METATARSAL;  Surgeon: Wylene Simmer, MD;  Location: [facility];  Service: Orthopedics;  Laterality: Left;  ? PARTIAL HYSTERECTOMY  2006  ? TONSILLECTOMY    ? TUBAL LIGATION    ?  ?Social History:  ? reports that she has  been smoking cigarettes. She has a 30.00 pack-year smoking history. She has never used smokeless tobacco. She reports current alcohol use of about 1.0 standard drink per week. She reports that she does not use drugs.  ?Family History:  ?Her family history includes Breast cancer in her maternal grandmother; Diabetes in her maternal aunt; Heart disease in her father; Irritable bowel syndrome in her brother and sister. There is no history of Colon cancer.  ?Allergies ?Allergies  ?Allergen Reactions  ? Lamictal [Lamotrigine] Rash  ? Other   ?  Cats: Rash  ?Shrimp scampi sauce: rash  ? Tramadol   ?  Lowers seizure threshold  ?  ?The patient is critically ill with multiple organ systems failure and requires high complexity decision making for assessment and support, frequent evaluation and titration of therapies, application of advanced monitoring technologies and extensive interpretation of multiple databases. Critical Care Time devoted to patient care services described in this note independent of APP/resident time (if applicable)  is 31 minutes.  ? ?Idamae Schuller, MD ?Tillie Rung. [facility] ?Internal Medicine Residency, PGY-1  ?[phone] ?

## 2021-05-04 ENCOUNTER — Inpatient Hospital Stay (HOSPITAL_COMMUNITY): Payer: Medicare Other

## 2021-05-04 DIAGNOSIS — R579 Shock, unspecified: Secondary | ICD-10-CM | POA: Diagnosis not present

## 2021-05-04 LAB — BASIC METABOLIC PANEL
Anion gap: 7 (ref 5–15)
BUN: 28 mg/dL — ABNORMAL HIGH (ref 8–23)
CO2: 29 mmol/L (ref 22–32)
Calcium: 8.1 mg/dL — ABNORMAL LOW (ref 8.9–10.3)
Chloride: 109 mmol/L (ref 98–111)
Creatinine, Ser: 0.47 mg/dL (ref 0.44–1.00)
GFR, Estimated: >60 mL/min (ref >60)
Glucose, Bld: 132 mg/dL — ABNORMAL HIGH (ref 70–99)
Potassium: 4.9 mmol/L (ref 3.5–5.1)
Sodium: 145 mmol/L (ref 135–145)

## 2021-05-04 LAB — GLUCOSE, CAPILLARY
Glucose-Capillary: 117 mg/dL — ABNORMAL HIGH (ref 70–99)
Glucose-Capillary: 113 mg/dL — ABNORMAL HIGH (ref 70–99)
Glucose-Capillary: 135 mg/dL — ABNORMAL HIGH (ref 70–99)
Glucose-Capillary: 190 mg/dL — ABNORMAL HIGH (ref 70–99)
Glucose-Capillary: 160 mg/dL — ABNORMAL HIGH (ref 70–99)
Glucose-Capillary: 189 mg/dL — ABNORMAL HIGH (ref 70–99)

## 2021-05-04 LAB — CBC
HCT: 28.6 % — ABNORMAL LOW (ref 36.0–46.0)
Hemoglobin: 8.6 g/dL — ABNORMAL LOW (ref 12.0–15.0)
MCH: 28.9 pg (ref 26.0–34.0)
MCHC: 30.1 g/dL (ref 30.0–36.0)
MCV: 96 fL (ref 80.0–100.0)
Platelets: 168 10*3/uL (ref 150–400)
RBC: 2.98 MIL/uL — ABNORMAL LOW (ref 3.87–5.11)
RDW: 14.6 % (ref 11.5–15.5)
WBC: 9.8 10*3/uL (ref 4.0–10.5)
nRBC: 0 % (ref 0.0–0.2)

## 2021-05-04 LAB — MAGNESIUM: Magnesium: 1.9 mg/dL (ref 1.7–2.4)

## 2021-05-04 MED ORDER — NALOXONE HCL 0.4 MG/ML IJ SOLN
INTRAMUSCULAR | Status: AC
  Administered 2021-05-04: 0.4 mg via INTRAVENOUS
  Filled 2021-05-04: qty 1

## 2021-05-04 MED ORDER — METHYLPREDNISOLONE SODIUM SUCC 125 MG IJ SOLR
INTRAMUSCULAR | Status: AC
Start: 2021-05-04 — End: 2021-05-04
  Filled 2021-05-04: qty 2

## 2021-05-04 MED ORDER — FUROSEMIDE 10 MG/ML IJ SOLN
40.0000 mg | Freq: Once | INTRAMUSCULAR | Status: AC
  Administered 2021-05-04: 40 mg via INTRAVENOUS
  Filled 2021-05-04: qty 4

## 2021-05-04 MED ORDER — BETHANECHOL CHLORIDE 10 MG PO TABS
10.0000 mg | ORAL_TABLET | Freq: Three times a day (TID) | ORAL | Status: AC
  Administered 2021-05-04 – 2021-05-05 (×6): 10 mg
  Filled 2021-05-04 (×6): qty 1

## 2021-05-04 MED ORDER — NALOXONE HCL 0.4 MG/ML IJ SOLN: 0.4000 mg | INTRAMUSCULAR | Status: DC | PRN

## 2021-05-04 MED ORDER — METHYLPREDNISOLONE SODIUM SUCC 125 MG IJ SOLR
125.0000 mg | Freq: Once | INTRAMUSCULAR | Status: AC
  Administered 2021-05-04: 125 mg via INTRAVENOUS

## 2021-05-04 MED ORDER — FUROSEMIDE 10 MG/ML IJ SOLN: 40.0000 mg | Freq: Once | INTRAMUSCULAR | Status: DC

## 2021-05-04 NOTE — Evaluation (Signed)
Occupational Therapy Evaluation ?Patient Details ?Name: [name] ?MRN: [id number] ?DOB: 1960/12/09 ?Today's Date: 05/04/2021 ? ? ?History of Present Illness Pt adm 4/17 with septic shock due to RLE cellulitis and PNA. Pt developed acute hypoxic respiratory failure and placed on bipap 4/19. Developed iatrogenic pneumothorax and chest tube placed. PMH - Multiple Sclerosis, seizure, ankylosing spondylitis, fibromyalgia  ? ?Clinical Impression ?  ?PTA, pt lives with family, typically ambulatory with Rollator vs cane, and Modified Independent with ADLs. Pt presents now with diagnoses above, requiring high levels of supplemental O2 and noted with deficits in cardiopulmonary tolerance, strength, and balance. Pt able to sit EOB briefly but required +2 physical assist to complete. While EOB, pt with increasing anxiety/fatigue and unable to progress OOB today. Pt requires Mod A for UB ADL and up to Total A for LB ADLs. Due to O2 needs and deconditioning, pt may need prolonged recovery time and may benefit from OT services at [facility].  ?  ?SpO2 >96% on 35 L HHFNC, 90% FiO2 ?HR primarily 110s ?BP WFL  ? ?Recommendations for follow up therapy are one component of a multi-disciplinary discharge planning process, led by the attending physician.  Recommendations may be updated based on patient status, additional functional criteria and insurance authorization.  ? ?Follow Up Recommendations ? OT at [facility]-term acute care hospital  ?  ?Assistance Recommended at Discharge Frequent or constant Supervision/Assistance  ?Patient can return home with the following Two people to help with walking and/or transfers;A lot of help with bathing/dressing/bathroom ? ?  ?Functional Status Assessment ? Patient has had a recent decline in their functional status and demonstrates the ability to make significant improvements in function in a reasonable and predictable amount of time.  ?Equipment Recommendations ? Hospital bed  ?  ?Recommendations for  Other Services   ? ? ?  ?Precautions / Restrictions Precautions ?Precautions: Fall;Other (comment) ?Precaution Comments: watch SpO2 ?Restrictions ?Weight Bearing Restrictions: No  ? ?  ? ?Mobility Bed Mobility ?Overal bed mobility: Needs Assistance ?Bed Mobility: Supine to Sit, Sit to Supine ?  ?  ?Supine to sit: Max assist, +2 for safety/equipment, +2 for physical assistance, HOB elevated ?Sit to supine: Total assist, +2 for physical assistance, +2 for safety/equipment, HOB elevated ?  ?General bed mobility comments: Able to assist in initiating B LE to EOB and reaching to lift trunk but required significant assist in all aspects. Total A x 2 to return to supine with increasing fatigue/anxiety ?  ? ?Transfers ?Overall transfer level: Needs assistance ?  ?  ?  ?  ?  ?  ?  ?  ?General transfer comment: Total A x 2 for scooting along bedside. unable to progress to standing due to fatigue, anxiety and adamant on removing grip socks ?  ? ?  ?Balance Overall balance assessment: Needs assistance ?Sitting-balance support: No upper extremity supported, Feet supported ?Sitting balance-Leahy Scale: Poor ?Sitting balance - Comments: Mod A with posterior/L lateral lean primarily though impulsively leaning forward at times when perseverating on removing grip socks ?Postural control: Posterior lean, Left lateral lean ?  ?  ?  ?  ?  ?  ?  ?  ?  ?  ?  ?  ?  ?  ?   ? ?ADL either performed or assessed with clinical judgement  ? ?ADL Overall ADL's : Needs assistance/impaired ?Eating/Feeding: Set up;Sitting;Bed level ?  ?Grooming: Set up;Sitting ?  ?Upper Body Bathing: Moderate assistance;Sitting ?  ?Lower Body Bathing: Maximal assistance;Sitting/lateral leans;Bed level ?  ?  Upper Body Dressing : Moderate assistance;Bed level;Sitting ?  ?Lower Body Dressing: Total assistance;Bed level;Sitting/lateral leans ?Lower Body Dressing Details (indicate cue type and reason): sock mgmt ?  ?  ?Toileting- Clothing Manipulation and Hygiene: Total  assistance;Bed level ?  ?  ?  ?  ?General ADL Comments: Limited primarily by significantly lowered endurance and increased O2 demands (HHFNC)  ? ? ? ?Vision Baseline Vision/History: 1 Wears glasses ?Ability to See in Adequate Light: 0 Adequate ?Patient Visual Report: No change from baseline ?Vision Assessment?: No apparent visual deficits  ?   ?Perception   ?  ?Praxis   ?  ? ?Pertinent Vitals/Pain Pain Assessment ?Pain Assessment: Faces ?Faces Pain Scale: Hurts a little bit ?Pain Location: lower BLE (bandaged areas) ?Pain Descriptors / Indicators: Grimacing, Guarding ?Pain Intervention(s): Limited activity within patient's tolerance, Monitored during session  ? ? ? ?Hand Dominance Right ?  ?Extremity/Trunk Assessment Upper Extremity Assessment ?Upper Extremity Assessment: Generalized weakness ?  ?Lower Extremity Assessment ?Lower Extremity Assessment: Defer to PT evaluation ?  ?  ?  ?Communication Communication ?Communication: No difficulties ?  ?Cognition Arousal/Alertness: Awake/alert ?Behavior During Therapy: [facility] for tasks assessed/performed, Anxious ?Overall Cognitive Status: Impaired/Different from baseline ?Area of Impairment: Attention, Following commands, Awareness, Problem solving, Safety/judgement ?  ?  ?  ?  ?  ?  ?  ?  ?  ?Current Attention Level: Selective ?  ?Following Commands: Follows one step commands consistently, Follows one step commands with increased time ?Safety/Judgement: Decreased awareness of deficits ?Awareness: Emergent ?Problem Solving: Slow processing, Decreased initiation, Requires verbal cues ?General Comments: pleasant, follows one step commands but benefits from cues to initiate/sequence. increased anxiety with movement/sitting EOB, perseverating on removing socks and reporting difficulty breathing; cues for breathing techniques and calming efforts needed ?  ?  ?General Comments    ? ?  ?Exercises   ?  ?Shoulder Instructions    ? ? ?Home Living Family/patient expects to be  discharged to:: Private residence ?Living Arrangements: Spouse/significant other;Children ?Available Help at Discharge: Family;Available PRN/intermittently ?Type of Home: House ?Home Access: Stairs to enter ?Entrance Stairs-Number of Steps: 3-5 ?Entrance Stairs-Rails: Right ?Home Layout: One level ?  ?  ?Bathroom Shower/Tub: Walk-in shower ?  ?Bathroom Toilet: Standard ?Bathroom Accessibility: Yes ?  ?Home Equipment: Rollator (4 wheels);Cane - single point;Shower seat;Grab bars - tub/shower ?  ?  ?  ? ?  ?Prior Functioning/Environment Prior Level of Function : Needs assist ?  ?  ?  ?Physical Assist : ADLs (physical) ?  ?ADLs (physical): IADLs ?Mobility Comments: Uses rollator or cane at times ?ADLs Comments: Modified Independent with ADLs. family complete IADLs ?  ? ?  ?  ?OT Problem List: Decreased strength;Decreased activity tolerance;Impaired balance (sitting and/or standing);Cardiopulmonary status limiting activity;Decreased cognition;Decreased safety awareness ?  ?   ?OT Treatment/Interventions: Self-care/ADL training;Therapeutic exercise;Energy conservation;DME and/or AE instruction;Therapeutic activities;Patient/family education;Balance training  ?  ?OT Goals(Current goals can be found in the care plan section) Acute Rehab OT Goals ?Patient Stated Goal: feel better soon ?OT Goal Formulation: With patient ?Time For Goal Achievement: 05/18/21 ?Potential to Achieve Goals: Fair  ?OT Frequency: Min 2X/week ?  ? ?Co-evaluation PT/OT/SLP Co-Evaluation/Treatment: Yes ?Reason for Co-Treatment: Complexity of the patient's impairments (multi-system involvement);For patient/therapist safety;To address functional/ADL transfers ?  ?OT goals addressed during session: ADL's and self-care;Strengthening/ROM ?  ? ?  ?AM-PAC OT "6 Clicks" Daily Activity     ?Outcome Measure Help from another person eating meals?: A Little ?Help from another person taking care of personal  grooming?: A Little ?Help from another person toileting,  which includes using toliet, bedpan, or urinal?: Total ?Help from another person bathing (including washing, rinsing, drying)?: A Lot ?Help from another person to put on and taking off regular upper body clothing?

## 2021-05-04 NOTE — Progress Notes (Signed)
? ?NAME:  [name], MRN:  [id number], DOB:  Aug 31, 1960, LOS: 8 ?ADMISSION DATE:  04/26/2021, CONSULTATION DATE:  04/17 ?REFERRING MD:  EDP, CHIEF COMPLAINT:  Septic shock  ? ?History of Present Illness:  ?This is a 82 yof w/ h/o MS (walks w/ walker at baseline). She started being treated w/ Ocrelizumab back in Feb. Per pt and family about 4 weeks after began to notice increased RLE swelling, scaling and weakness. She attributed this to an expected side effects of the medication. Then about 5-7d prior to presentation she developed increased weakness, worsening right leg pain, poor PO intake, and neck pain. These symptoms continued to worsen to point pain was no longer bearable and she was so weak that she was no longer able to get OOB.  ?  ?In ER she was  hypotensive in 70s, tachycardic, encephalopathic, wbc ct 17.2, CO2 9 w/ + anion gap metabolic acidosis, BUN 409, creatinine 7.81 and K 6.2 ?Pertinent  Medical History  ?MS walks w/ walker, GERD, HLA B27 positive, Pulm nodule, seizure, sacroiliitis, ankylosing spondylitis, fibromyalgia ?Significant Hospital Events: ?Including procedures, antibiotic start and stop dates in addition to other pertinent events   ?4/17 admitted w/ septic shock working dx cellulitis (RLE), Cultures sent, MRI ordered C-spine, CT lower ext ordered, Zosyn and Vanc started  ?04/19-BiPAP placed. Another chest tube placed for persistent pneumothorax.  ?04/20-Lateral chest tube removed.  ?4/23-still with air leak in chest tube ?Mental status improved. Patient on HFNC.  ? ?Interim History / Subjective:  ?Denied any acute concerns. States doing well.  ? ?Review of Systems:   ?Negative unless stated in the subjective.  ?Objective   ?Blood pressure 113/71, pulse (!) 117, temperature 97.8 ?F (36.6 ?C), temperature source Oral, resp. rate (!) 29, height 5' 4"  (1.626 m), weight 59.1 kg, SpO2 100 %. ?   ?FiO2 (%):  [100 %] 100 %  ? ?Intake/Output Summary (Last 24 hours) at 05/04/2021 0653 ?Last data  filed at 05/04/2021 8119 ?Gross per 24 hour  ?Intake 2784.81 ml  ?Output 2935 ml  ?Net -150.19 ml  ? ?2.9 L urine output and 1x stool occurrence. ?Filed Weights  ? 05/01/21 0307 05/02/21 0234 05/03/21 0127  ?Weight: 59.2 kg 59.1 kg 59.1 kg  ? ?Examination: ?General: NAD ?HENT: MMM, NCAT, on HFNC 90 % ?Lungs: rhonci and wheeze present bilaterally ?Cardiovascular: tachycardic, sinus rhythm. 2+ radial pulses ?Abdomen: normal bowel sounds, no TTP ?Extremities: bilateral extremities wrapped. No asymmetery ?Neuro: alert and oriented x4 ?GU: catheter in place with light colored urine inside.  ? ?Diagnostics: ?Mag wnl. WBC normal. Hgb at 8.6. Platletets improved and wnl. AKI resolved. Hypernatremia resolved.  ?CXR done and showed no acute findings. Potential increased congestion.  ? ?Consults  ?Nephrology ?Resolved Hospital Problem list   ?Hyperkalemia s/p IVF and insulin ?Acute metabolic encephalopathy after renal fxn improvement ?AKI ?Leukocytosis ?Assessment & Plan:  ?Sepsis secondary to bilateral lower extremity cellulitis ?Pneumonia ?Metabolic encephalopathy ?Leukocytosis ?-S/p ocrelizumab for MS as outpatient therapy.  ?-Lower extremity cellulitis felt to be related to ocrelizumab ?-Patient remains off pressors and off hydrocortisone. On ceftriaxone-will plan for 10 days total (day 9/10) ?-Blood cultures 4/17 negative to date, leukocytosis resolved. Encephalopathy resolving as well.  ? ?Acute hypoxemic respiratory failure  ?-Multifactorial, maybe patient still has some volume overload as she is net positive overall since admission. Patient responded to diuresis yesterday with good UOP and improvement in breathing. Will give lasix 40 mg again today. Could be secondary to underlying obstructive lung disease  and secondary to pneumonia. Deconditioning playing a role as well.  ?-Continue to wean oxygen supplementation as tolerated, still requiring high flow oxygen ? ?Hypernatremia ?Resolved. Previously on free water  flushes. CTM.  ? ?Iatrogenic pneumothorax ?-Chest tube remains in place. Chest tube still with air leak today, most recent chest x-ray shows presence of pneumothorax on the lower pressure setting. Will increase the pressure back to 20 mmHg.  ?-Continue chest tube to suction on 20 mmHg pressure.  ?-Will repeat CXR later in the afternoon ? ?Chronic obstructive pulmonary disease ?Tobacco use disorder ?Emphysema ?-Duoneb nebs q 6hrs ?-Nicotine patch in place ? ?Hyperglycemia ?-CGB within range. Continue SSI ? ?Agitation/anxiety ?-Continue seroquel 25 mg morning and 50 mg at bedtime.  ?-Continue BuSpar 10 mg TID ?-Gabapentin 300 mg BID.  ?-Maintain pain medications ? ?History of cavitary pulmonary nodule, left upper lobe ?-Interval decrease in size of nodule. No PFTs at baseline.  ?-Following up with Dr. Melvyn Novas in the outpatient setting, to follow-up as outpatient ? ? ?Best Practice (right click and "Reselect all SmartList Selections" daily)  ?Diet/type: NPO with ice chips ?DVT prophylaxis: prophylactic heparin  ?GI prophylaxis: N/A ?Lines: N/A ?Foley:  Yes ?Continuous: None ? ?Code Status:  full code ?Last date of multidisciplinary goals of care discussion. Will update family today.  ? ?Past Medical History:  ?She,  has a past medical history of Allergy, Anemia, GERD (gastroesophageal reflux disease), HLA B27 (HLA B27 positive), UTI (urinary tract infection), Hyperplastic colon polyp, Hypertension, IBS (irritable bowel syndrome), Multiple sclerosis ([place]), and Seizures ([place]).  ?Surgical History:  ? ?Past Surgical History:  ?Procedure Laterality Date  ? APPENDECTOMY    ? BLADDER SURGERY  2007  ? CESAREAN SECTION    ? x 2   ? ORIF TOE FRACTURE Left 06/27/2013  ? Procedure: OPEN REDUCTION INTERNAL FIXATION (ORIF) LEFT FIFTH METATARSAL;  Surgeon: Wylene Simmer, MD;  Location: [place];  Service: Orthopedics;  Laterality: Left;  ? PARTIAL HYSTERECTOMY  2006  ? TONSILLECTOMY    ? TUBAL LIGATION    ?  ?Social  History:  ? reports that she has been smoking cigarettes. She has a 30.00 pack-year smoking history. She has never used smokeless tobacco. She reports current alcohol use of about 1.0 standard drink per week. She reports that she does not use drugs.  ?Family History:  ?Her family history includes Breast cancer in her maternal grandmother; Diabetes in her maternal aunt; Heart disease in her father; Irritable bowel syndrome in her brother and sister. There is no history of Colon cancer.  ?Allergies ?Allergies  ?Allergen Reactions  ? Lamictal [Lamotrigine] Rash  ? Other   ?  Cats: Rash  ?Shrimp scampi sauce: rash  ? Tramadol   ?  Lowers seizure threshold  ?  ?The patient is critically ill with multiple organ systems failure and requires high complexity decision making for assessment and support, frequent evaluation and titration of therapies, application of advanced monitoring technologies and extensive interpretation of multiple databases. Critical Care Time devoted to patient care services described in this note independent of APP/resident time (if applicable)  is 31 minutes.  ? ?Idamae Schuller, MD ?Tillie Rung. [facility] ?Internal Medicine Residency, PGY-1  ?[phone] ?

## 2021-05-04 NOTE — Progress Notes (Signed)
Physical Therapy Treatment ?Patient Details ?Name: [name] ?MRN: [id number] ?DOB: Jun 18, 1960 ?Today's Date: 05/04/2021 ? ? ?History of Present Illness Pt adm 4/17 with septic shock due to RLE cellulitis and PNA. Pt developed acute hypoxic respiratory failure and placed on bipap 4/19. Developed iatrogenic pneumothorax and chest tube placed. PMH - Multiple Sclerosis, seizure, ankylosing spondylitis, fibromyalgia ? ?  ?PT Comments  ? ? Able to assist pt to sitting EOB with 2 person assist with pt only able to tolerate 4-5 minutes due to anxiety. Pt unable to support self on EOB which was worsened by decr attention due to anxiety. Expect rehab will need to be slow and steady. Continue to recommend LTACH.    ?Recommendations for follow up therapy are one component of a multi-disciplinary discharge planning process, led by the attending physician.  Recommendations may be updated based on patient status, additional functional criteria and insurance authorization. ? ?Follow Up Recommendations ? PT at Long-term acute care hospital ?  ?  ?Assistance Recommended at Discharge Frequent or constant Supervision/Assistance  ?Patient can return home with the following Two people to help with walking and/or transfers;Two people to help with bathing/dressing/bathroom;Assistance with cooking/housework;Assist for transportation;Help with stairs or ramp for entrance ?  ?Equipment Recommendations ? Hospital bed;Wheelchair (measurements PT);Wheelchair cushion (measurements PT) (hoyer lift)  ?  ?Recommendations for Other Services   ? ? ?  ?Precautions / Restrictions Precautions ?Precautions: Fall;Other (comment) ?Precaution Comments: watch SpO2 ?Restrictions ?Weight Bearing Restrictions: No  ?  ? ?Mobility ? Bed Mobility ?Overal bed mobility: Needs Assistance ?Bed Mobility: Supine to Sit, Sit to Supine ?  ?  ?Supine to sit: Max assist, +2 for safety/equipment, +2 for physical assistance, HOB elevated ?Sit to supine: Total assist, +2  for physical assistance, +2 for safety/equipment, HOB elevated ?  ?General bed mobility comments: Able to assist in initiating B LE to EOB and reaching to lift trunk but required significant assist in all aspects. Total A x 2 to return to supine with increasing fatigue/anxiety ?  ? ?Transfers ?Overall transfer level: Needs assistance ?  ?  ?  ?  ?  ?  ?  ?  ?General transfer comment: Total A x 2 for scooting along bedside. unable to progress to standing due to fatigue, anxiety and adamant on removing grip socks ?  ? ?Ambulation/Gait ?  ?  ?  ?  ?  ?  ?  ?  ? ? ?Stairs ?  ?  ?  ?  ?  ? ? ?Wheelchair Mobility ?  ? ?Modified Rankin (Stroke Patients Only) ?  ? ? ?  ?Balance Overall balance assessment: Needs assistance ?Sitting-balance support: No upper extremity supported, Feet supported ?Sitting balance-Leahy Scale: Poor ?Sitting balance - Comments: Mod A with posterior/L lateral lean primarily though impulsively leaning forward at times when perseverating on removing grip socks ?Postural control: Posterior lean, Left lateral lean ?  ?  ?  ?  ?  ?  ?  ?  ?  ?  ?  ?  ?  ?  ?  ? ?  ?Cognition Arousal/Alertness: Awake/alert ?Behavior During Therapy: Anxious ?Overall Cognitive Status: Impaired/Different from baseline ?Area of Impairment: Attention, Following commands, Awareness, Problem solving, Safety/judgement ?  ?  ?  ?  ?  ?  ?  ?  ?  ?Current Attention Level: Selective ?  ?Following Commands: Follows one step commands consistently, Follows one step commands with increased time ?Safety/Judgement: Decreased awareness of deficits ?Awareness: Emergent ?Problem Solving: Slow processing,  Decreased initiation, Requires verbal cues ?General Comments: Pt very anxious with activity and difficult to redirect. Perseverating on removing socks when sitting EOB. ?  ?  ? ?  ?Exercises   ? ?  ?General Comments General comments (skin integrity, edema, etc.): VSS on 35L HHFNC. ?  ?  ? ?Pertinent Vitals/Pain Pain Assessment ?Pain  Assessment: Faces ?Faces Pain Scale: Hurts a little bit ?Pain Location: lower BLE (bandaged areas) ?Pain Descriptors / Indicators: Grimacing, Guarding ?Pain Intervention(s): Limited activity within patient's tolerance, Repositioned  ? ? ?Home Living Family/patient expects to be discharged to:: Private residence ?Living Arrangements: Spouse/significant other;Children ?Available Help at Discharge: Family;Available PRN/intermittently ?Type of Home: House ?Home Access: Stairs to enter ?Entrance Stairs-Rails: Right ?Entrance Stairs-Number of Steps: 3-5 ?  ?Home Layout: One level ?Home Equipment: Rollator (4 wheels);Cane - single point;Shower seat;Grab bars - tub/shower ?   ?  ?Prior Function    ?  ?  ?   ? ?PT Goals (current goals can now be found in the care plan section) Progress towards PT goals: Progressing toward goals ? ?  ?Frequency ? ? ? Min 3X/week ? ? ? ?  ?PT Plan Current plan remains appropriate  ? ? ?Co-evaluation PT/OT/SLP Co-Evaluation/Treatment: Yes ?Reason for Co-Treatment: Complexity of the patient's impairments (multi-system involvement);For patient/therapist safety ?PT goals addressed during session: Mobility/safety with mobility;Balance ?OT goals addressed during session: ADL's and self-care;Strengthening/ROM ?  ? ?  ?AM-PAC PT "6 Clicks" Mobility   ?Outcome Measure ? Help needed turning from your back to your side while in a flat bed without using bedrails?: Total ?Help needed moving from lying on your back to sitting on the side of a flat bed without using bedrails?: Total ?Help needed moving to and from a bed to a chair (including a wheelchair)?: Total ?Help needed standing up from a chair using your arms (e.g., wheelchair or bedside chair)?: Total ?Help needed to walk in hospital room?: Total ?Help needed climbing 3-5 steps with a railing? : Total ?6 Click Score: 6 ? ?  ?End of Session Equipment Utilized During Treatment: Oxygen ?Activity Tolerance: Patient limited by fatigue;Other (comment)  (anxiety) ?Patient left: in bed;with call bell/phone within reach ?Nurse Communication: Mobility status ?PT Visit Diagnosis: Other abnormalities of gait and mobility (R26.89);Muscle weakness (generalized) (M62.81);Pain ?Pain - Right/Left:  (bilateral) ?Pain - part of body: Leg ?  ? ? ?Time: 1610-9604 ?PT Time Calculation (min) (ACUTE ONLY): 23 min ? ?Charges:  $Therapeutic Activity: 8-22 mins          ?          ? ?[facility] ?Acute Rehabilitation Services ?Office [phone] ? ? ? ?Angelina Ok [facility] ?05/04/2021, 1:17 PM ? ?

## 2021-05-04 NOTE — Procedures (Signed)
Insertion of Chest Tube Procedure Note ? ?KATARYNA MCQUILKIN  ?[id number]  ?1960-10-28 ? ?Date:05/04/21  ?Time:5:06 PM  ? ? ?Provider Performing: Tomma Lightning  ? ?Procedure: Chest Tube Insertion (78588) ? ?Indication(s) ?Pneumothorax ? ?Consent ?Unable to obtain consent due to emergent nature of procedure. ? ?Anesthesia ?Topical only with 1% lidocaine  ? ? ?Time Out ?Verified patient identification, verified procedure, site/side was marked, verified correct patient position, special equipment/implants available, medications/allergies/relevant history reviewed, required imaging and test results available. ? ? ?Sterile Technique ?Maximal sterile technique was not able to be achieved due to emergent nature of procedure. ? ? ?Procedure Description ?Ultrasound not used to identify appropriate pleural anatomy for placement and overlying skin marked. Area of placement cleaned and draped in sterile fashion.  A 14 French pigtail pleural catheter was placed into the right pleural space using Seldinger technique. Appropriate return of air was obtained.  The tube was connected to [facility]rium and placed on -20 cm H2O wall suction. ? ? ?Complications/Tolerance ?None; patient tolerated the procedure well. ?Chest X-ray is ordered to verify placement. ? ? ?EBL ?Minimal ? ?Specimen(s) ?none ?

## 2021-05-04 NOTE — Code Documentation (Signed)
Responded to Code Stroke called at 1936 for L sided facial droop and dysarthira, LSN-unclear(pt here with encephalopathy). Pt received oxy IR 5mg  at 1550. 0.4mg  narcan was given on my arrival prior to completing NIH. CBG-160, NIH-9 for L facial droop(though this is inconsistent), dysarthria, B upper and lower extremity weakness, and LOC questions. CT not completed d/t tenous respiratory status. Dr. to bedside to evaluate pt-low suspicion for stroke. Pt not TNK/IR candidate d/t unclear LSN and no LVO signs. Plan MRI if PCP still concerned about stroke.  ?

## 2021-05-04 NOTE — Consult Note (Addendum)
NEUROLOGY CONSULTATION NOTE  ? ?Date of service: May 04, 2021 ?Patient Name: [name] ?MRN:  [id number] ?DOB:  06/12/1960 ?Reason for consult: "Stroke code for " ?Requesting Provider: Laurin Coder, MD ?_ _ _   _ __   _ __ _ _  __ __   _ __   __ _ ? ?History of Present Illness  ?[name] is a 61 y.o. female with PMH significant for GERD, Primary progressive MS and dependent on walker at baseline and recently started on Ocrevius in feb, seizures on Keppra (discharged from [facility] in 2015, see notes from Dr. Krista Blue dated 03/22/13) who presented with encephalopathy, generalized weakness, BL lower ext scaling and wounds. She was found to be in septic shock from pneumonia, acute renal failure and then developed iatrogenic pneumothorax and s/p chest tube placement. She is also being treated for BL leg cellulitis. ? ?A code stroke was activated on her today in the evening with a unclear LKW for a mild left facial asymmetry, dysarthria. On my evaluation, unclear if she truly has a left facial droop vs facial fold, maybe mild dysarthria but her mouth is also very dry. ? ?LKW: unclear and hard to establish given her encephalopathy and poor participation with exam and agitated easily. ?mRS: 4 ?tNKASE: not offered given unclear LKW ?Thrombectomy: not offered given low suspicion for LVO. ?NIHSS components Score: Comment  ?1a Level of Conscious 0_0  1_1  2_2  3_3      ?1b LOC Questions 0_4  1_5  2_6       ?1c LOC Commands 0_7  1_8  2_9       ?2 Best Gaze 0_10  1_11  2_12       ?3 Visual 0_13  1_14  2_15  3_16      ?4 Facial Palsy 0_17  1_18  2_19  3_20      ?5a Motor Arm - left 0_21  1_22  2_23  3_24  4_25  UN_26    ?5b Motor Arm - Right 0_27  1_28  2_29  3_30  4_31  UN_32    ?6a Motor Leg - Left 0_33  1_34  2_35  3_36  4_37  UN_38    ?6b Motor Leg - Right 0_39  1_40  2_41  3_42  4_43  UN_44    ?7 Limb Ataxia 0_45  1_46  2_47  3_48  UN_49     ?8 Sensory 0_50  1_51  2_52  UN_53      ?9 Best Language 0_54  1_55  2_56  3_57      ?10 Dysarthria 0_58  1_59  2_60  UN_61      ?11 Extinct. and Inattention 0_62   1_63  2_64       ?TOTAL: 8   ?  ?ROS  ? ?Unable to get detailed ROS given extreme agitation and refusal to participate with exam or answer questions. ? ?Past History  ? ?Past Medical History:  ?Diagnosis Date  ? Allergy   ? seasonal  ? Anemia   ? GERD (gastroesophageal reflux disease)   ? HLA B27 (HLA B27 positive)   ? Hx: UTI (urinary tract infection)   ? Hyperplastic colon polyp   ? Hypertension   ? IBS (irritable bowel syndrome)   ? Multiple sclerosis ([place])   ? Seizures ([place])   ? hx epilepsy-last seizure 9/14 after taking tramadol  ? ?Past Surgical History:  ?Procedure Laterality Date  ? APPENDECTOMY    ? BLADDER SURGERY  2007  ? CESAREAN SECTION    ? x 2   ? ORIF TOE FRACTURE Left 06/27/2013  ? Procedure: OPEN REDUCTION INTERNAL FIXATION (ORIF) LEFT FIFTH METATARSAL;  Surgeon: Wylene Simmer, MD;  Location: [facility];  Service: Orthopedics;  Laterality: Left;  ? PARTIAL HYSTERECTOMY  2006  ? TONSILLECTOMY    ?  TUBAL LIGATION    ? ?Family History  ?Problem Relation Age of Onset  ? Heart disease Father   ? Breast cancer Maternal Grandmother   ? Diabetes Maternal Aunt   ? Colon cancer Neg Hx   ? Irritable bowel syndrome Sister   ? Irritable bowel syndrome Brother   ? ?Social History  ? ?Socioeconomic History  ? Marital status: Married  ?  Spouse name: Jori Moll  ? Number of children: 3  ? Years of education: 4  ? Highest education level: Not on file  ?Occupational History  ? Occupation: Mortage   ?  Employer: [employer]  ?Tobacco Use  ? Smoking status: Every Day  ?  Packs/day: 1.00  ?  Years: 30.00  ?  Pack years: 30.00  ?  Types: Cigarettes  ? Smokeless tobacco: Never  ? Tobacco comments:  ?  1 ppd  ?Substance and Sexual Activity  ? Alcohol use: Yes  ?  Alcohol/week: 1.0 standard drink  ?  Types: 1 Glasses of wine per week  ?  Comment: OCC.  ? Drug use: No  ? Sexual activity: Not on file  ?Other Topics Concern  ? Not on file  ?Social History Narrative  ? 2 caffeine drinks daily . Patient lives at  home with her husband Jori Moll) and her two children.   ? Patient has three children.  ? Patient has some a high school education and took some college courses.  ? Right handed.  ? ?Social Determinants of Health  ? ?Financial Resource Strain: Not on file  ?Food Insecurity: Not on file  ?Transportation Needs: Not on file  ?Physical Activity: Not on file  ?Stress: Not on file  ?Social Connections: Not on file  ? ?Allergies  ?Allergen Reactions  ? Lamictal [Lamotrigine] Rash  ? Other   ?  Cats: Rash  ?Shrimp scampi sauce: rash  ? Tramadol   ?  Lowers seizure threshold  ? ? ?Medications  ? ?Medications Prior to Admission  ?Medication Sig Dispense Refill Last Dose  ? Cholecalciferol (VITAMIN D3) 25 MCG (1000 UT) CAPS Take 1,000 Units by mouth daily.   04/25/2021  ? gabapentin (NEURONTIN) 300 MG capsule Take 300 mg by mouth 2 (two) times daily.   04/25/2021  ? glycopyrrolate (ROBINUL) 1 MG tablet Take 1 mg by mouth 2 (two) times daily.   04/25/2021  ? hydrOXYzine (ATARAX) 25 MG tablet Take 25 mg by mouth 2 (two) times daily as needed for anxiety.   04/25/2021  ? ibuprofen (ADVIL,MOTRIN) 200 MG tablet Take 600 mg by mouth every 6 (six) hours as needed for moderate pain.   04/25/2021  ? levETIRAcetam (KEPPRA) 500 MG tablet Take 1 tablet (500 mg total) by mouth 2 (two) times daily. 60 tablet 12 04/26/2021  ? losartan (COZAAR) 25 MG tablet Take 25 mg by mouth every evening.    04/26/2021  ? Turmeric (QC TUMERIC COMPLEX PO) Take 475 mg by mouth daily.   04/25/2021  ?  ? ?Vitals  ? ?Vitals:  ? 05/04/21 1800 05/04/21 1855 05/04/21 1900 05/04/21 1920  ?BP: (!) 90/57  92/61   ?Pulse: (!) 103 (!) 105 (!) 105   ?Resp: (!) 25 (!) 21 (!) 25   ?Temp:    99 ?F (37.2 ?C)  ?TempSrc:    Axillary  ?SpO2: 100% 100% 100%   ?Weight:      ?Height:      ?  ? ?Body mass index is 22.36 kg/m?. ? ?Physical Exam  ? ?  General: Laying comfortably in bed; in no acute distress.  ?HENT: Normal oropharynx and mucosa. Normal external appearance of ears and nose.   ?Neck: Supple, no pain or tenderness  ?CV: No JVD. No peripheral edema.  ?Pulmonary: Symmetric Chest rise. Normal respiratory effort.  ?Abdomen: Soft to touch, non-tender.  ?Ext: No cyanosis, edema, or deformity, wrapped in bandages BL. ?Skin: No rash. Normal palpation of skin.   ?Musculoskeletal: Normal digits and nails by inspection. No clubbing. ? ?Neurologic Examination  ?Mental status/Cognition: Alert, oriented to self, not oriented to month or her age. Displays extreme agitation and refused to participate with exam. ?Speech/language: dysarthric, fluent, comprehension intact, object naming intact, repetition intact. ?Cranial nerves:  ? CN II Pupils equal and reactive to light, difficult to assess for VF deficit but she makes looks left and right and makes eye contact on both sides.  ? CN III,IV,VI EOM intact, no gaze preference or deviation, no nystagmus  ? CN V normal sensation in V1, V2, and V3 segments bilaterally  ? CN VII Milf flattening of nasolabial fold on the left   ? CN VIII normal hearing to speech   ? CN IX & X normal palatal elevation, no uvular deviation  ? CN XI 5/5 head turn and 5/5 shoulder shrug bilaterally  ? CN XII midline tongue protrusion  ? ?Motor:  ?Muscle bulk: poor, tone normal, pronator drift none ?Mvmt Root Nerve  Muscle Right Left Comments  ?SA C5/6 Ax Deltoid     ?EF C5/6 Mc Biceps 5 5   ?EE C6/7/8 Rad Triceps 5 5   ?WF C6/7 Med FCR     ?WE C7/8 PIN ECU     ?F Ab C8/T1 U ADM/FDI 5 4+   ?HF L1/2/3 Fem Illopsoas 3 3 Uses walker and has BL cellulitis and poor participation with exam. All of this precludes accurate assessment of leg strength  ?KE L2/3/4 Fem Quad     ?DF L4/5 D Peron Tib Ant     ?PF S1/2 Tibial Grc/Sol     ? ?Reflexes: ? Right Left Comments  ?Pectoralis     ? Biceps (C5/6) 2 2   ?Brachioradialis (C5/6) 2 2   ? Triceps (C6/7) 2 2   ? Patellar (L3/4) 2 2   ? Achilles (S1)     ? Hoffman     ? Plantar     ?Jaw jerk   ? ?Sensation: ? Light touch Intact BL  ? Pin prick   ?  Temperature   ? Vibration   ?Proprioception   ? ?Coordination/Complex Motor:  ?- Finger to Nose unable to assess due to patient refusal but movements are smooth. ?- Heel to shin unable to assess. ?- Rapid

## 2021-05-04 NOTE — Progress Notes (Signed)
eLink Physician-Brief Progress Note ?Patient Name: [name] ?DOB: 14-Jun-1960 ?MRN: [id number] ? ? ?Date of Service ? 05/04/2021  ?HPI/Events of Note ? Patient needs AM labs.  ?eICU Interventions ? AM labs ordered.  ? ? ? ?  ? ?Kerry Kass Nathanie Ottley ?05/04/2021, 11:27 PM ?

## 2021-05-05 ENCOUNTER — Inpatient Hospital Stay (HOSPITAL_COMMUNITY): Payer: Medicare Other

## 2021-05-05 DIAGNOSIS — R579 Shock, unspecified: Secondary | ICD-10-CM | POA: Diagnosis not present

## 2021-05-05 LAB — BASIC METABOLIC PANEL
Anion gap: 7 (ref 5–15)
BUN: 31 mg/dL — ABNORMAL HIGH (ref 8–23)
CO2: 29 mmol/L (ref 22–32)
Calcium: 8.3 mg/dL — ABNORMAL LOW (ref 8.9–10.3)
Chloride: 108 mmol/L (ref 98–111)
Creatinine, Ser: 0.53 mg/dL (ref 0.44–1.00)
GFR, Estimated: >60 mL/min (ref >60)
Glucose, Bld: 165 mg/dL — ABNORMAL HIGH (ref 70–99)
Potassium: 5.2 mmol/L — ABNORMAL HIGH (ref 3.5–5.1)
Sodium: 144 mmol/L (ref 135–145)

## 2021-05-05 LAB — CBC WITH DIFFERENTIAL/PLATELET
Abs Immature Granulocytes: 0.17 10*3/uL — ABNORMAL HIGH (ref 0.00–0.07)
Basophils Absolute: 0 10*3/uL (ref 0.0–0.1)
Basophils Relative: 0 %
Eosinophils Absolute: 0 10*3/uL (ref 0.0–0.5)
Eosinophils Relative: 0 %
HCT: 27.4 % — ABNORMAL LOW (ref 36.0–46.0)
Hemoglobin: 8.6 g/dL — ABNORMAL LOW (ref 12.0–15.0)
Immature Granulocytes: 2 %
Lymphocytes Relative: 1 %
Lymphs Abs: 0.1 10*3/uL — ABNORMAL LOW (ref 0.7–4.0)
MCH: 29.4 pg (ref 26.0–34.0)
MCHC: 31.4 g/dL (ref 30.0–36.0)
MCV: 93.5 fL (ref 80.0–100.0)
Monocytes Absolute: 0.1 10*3/uL (ref 0.1–1.0)
Monocytes Relative: 1 %
Neutro Abs: 10.3 10*3/uL — ABNORMAL HIGH (ref 1.7–7.7)
Neutrophils Relative %: 96 %
Platelets: 202 10*3/uL (ref 150–400)
RBC: 2.93 MIL/uL — ABNORMAL LOW (ref 3.87–5.11)
RDW: 14.2 % (ref 11.5–15.5)
WBC: 10.7 10*3/uL — ABNORMAL HIGH (ref 4.0–10.5)
nRBC: 0 % (ref 0.0–0.2)

## 2021-05-05 LAB — GLUCOSE, CAPILLARY
Glucose-Capillary: 162 mg/dL — ABNORMAL HIGH (ref 70–99)
Glucose-Capillary: 157 mg/dL — ABNORMAL HIGH (ref 70–99)
Glucose-Capillary: 148 mg/dL — ABNORMAL HIGH (ref 70–99)
Glucose-Capillary: 135 mg/dL — ABNORMAL HIGH (ref 70–99)
Glucose-Capillary: 129 mg/dL — ABNORMAL HIGH (ref 70–99)
Glucose-Capillary: 122 mg/dL — ABNORMAL HIGH (ref 70–99)

## 2021-05-05 LAB — MAGNESIUM: Magnesium: 2 mg/dL (ref 1.7–2.4)

## 2021-05-05 MED ORDER — MENTHOL 3 MG MT LOZG
1.0000 | LOZENGE | Freq: Four times a day (QID) | OROMUCOSAL | Status: DC
  Administered 2021-05-05 (×2): 3 mg via ORAL
  Filled 2021-05-05: qty 9

## 2021-05-05 MED ORDER — SODIUM CHLORIDE 0.9 % IV SOLN: 250.0000 mL | INTRAVENOUS | Status: DC

## 2021-05-05 MED ORDER — FUROSEMIDE 10 MG/ML IJ SOLN
40.0000 mg | Freq: Once | INTRAMUSCULAR | Status: AC
  Administered 2021-05-05: 40 mg via INTRAVENOUS
  Filled 2021-05-05: qty 4

## 2021-05-05 MED ORDER — MENTHOL 3 MG MT LOZG
1.0000 | LOZENGE | OROMUCOSAL | Status: DC | PRN
  Filled 2021-05-05 (×2): qty 9

## 2021-05-05 MED ORDER — IPRATROPIUM-ALBUTEROL 0.5-2.5 (3) MG/3ML IN SOLN
3.0000 mL | Freq: Two times a day (BID) | RESPIRATORY_TRACT | Status: DC
  Administered 2021-05-05 – 2021-05-16 (×22): 3 mL via RESPIRATORY_TRACT
  Filled 2021-05-05 (×21): qty 3

## 2021-05-05 MED ORDER — PHENYLEPHRINE HCL-NACL 20-0.9 MG/250ML-% IV SOLN
0.0000 ug/min | INTRAVENOUS | Status: DC
  Administered 2021-05-06: 75 ug/min via INTRAVENOUS
  Administered 2021-05-06 (×2): 25 ug/min via INTRAVENOUS
  Administered 2021-05-06: 90 ug/min via INTRAVENOUS
  Filled 2021-05-05 (×3): qty 250
  Filled 2021-05-05: qty 500
  Filled 2021-05-05: qty 250

## 2021-05-05 MED ORDER — FLUCONAZOLE 150 MG PO TABS
150.0000 mg | ORAL_TABLET | Freq: Once | ORAL | Status: AC
  Administered 2021-05-05: 150 mg
  Filled 2021-05-05 (×2): qty 1

## 2021-05-05 MED ORDER — LORAZEPAM 2 MG/ML IJ SOLN
0.5000 mg | Freq: Once | INTRAMUSCULAR | Status: AC
  Administered 2021-05-05: 0.5 mg via INTRAVENOUS
  Filled 2021-05-05: qty 1

## 2021-05-05 NOTE — Progress Notes (Signed)
Repeat chest x-ray shows 1 cm apical pneumothorax ? ?Both chest tubes are still in place and appear to be functioning well with air leak ? ?Patient appears more comfortable, did require 0.5 Ativan ? ?Will continue to monitor ? ?Repeat chest x-ray in a.m. ? ?Chest x-ray may be repeated if patient is struggling ?

## 2021-05-05 NOTE — Progress Notes (Signed)
eLink Physician-Brief Progress Note ?Patient Name: [name] ?DOB: 1960/09/01 ?MRN: [id number] ? ? ?Date of Service ? 05/05/2021  ?HPI/Events of Note ? Patient's blood pressure has been soft, although it improved with the stimulation of bathing her, she is still being diuresed.  ?eICU Interventions ? Peripheral  Phenylephrine gtt added to the [facility] in case it is needed.  ? ? ? ?  ? ?[name] ?05/05/2021, 11:04 PM ?

## 2021-05-05 NOTE — Progress Notes (Signed)
Nutrition Follow-up ? ?DOCUMENTATION CODES:  ? ?Not applicable ? ?INTERVENTION:  ? ?Continue tube feeding via Cortrak: ?Osmolite 1.2 at goal rate of 60 ml/h (1440 ml per day) ?Prosource TF 45 ml BID ? ?Provides 1728 kcal, 102 gm protein, 1181 ml free water daily. ? ?Continue MVI with minerals daily via tube. ? ?NUTRITION DIAGNOSIS:  ? ?Increased nutrient needs related to acute illness, wound healing as evidenced by estimated needs. ? ?Ongoing  ? ?GOAL:  ? ?Patient will meet greater than or equal to 90% of their needs ? ?Met with TF ? ?MONITOR:  ? ?TF tolerance, Skin, Labs ? ?REASON FOR ASSESSMENT:  ? ?Consult ?Enteral/tube feeding initiation and management ? ?ASSESSMENT:  ? ?61 yo female admitted with hypovolemic/septic shock r/t BLE cellulitis. PMH includes GERD, HTN, IBS, seizures, MS. ? ?Discussed patient in ICU rounds and with RN today. ?Patient is currently on 10 L salter, no longer requiring high flow. ?Patient is intermittently confused. ?Required placement of second chest tube on 4/25, 270 ml output x 24 hours. ?First chest tube remains in place, no output documented x 24 hours.  ? ?Cortrak placed 4/19, tip is gastric. ?Currently receiving Osmolite 1.2 at 60 ml/h with Prosource TF 45 ml BID. ?Tolerating TF without difficulty. ? ?S/P bedside swallow evaluation with SLP today. Diet advanced to full liquids. Expect intake will be poor. Will continue TF to meet 100% of estimated nutrition needs.  ? ?Labs reviewed. K 5.2 ?CBG: [phone] ? ?Medications reviewed and include Novolog, Keppra, MVI with minerals. ? ? ?Diet Order:   ?Diet Order   ? ?       ?  Diet full liquid Room service appropriate? Yes; Fluid consistency: Thin  Diet effective now       ?  ? ?  ?  ? ?  ? ? ?EDUCATION NEEDS:  ? ?Not appropriate for education at this time ? ?Skin:  Skin Assessment: Skin Integrity Issues: ?Skin Integrity Issues:: Other (Comment), Stage II ?Stage II: sacrum per MD note ?Other: bilateral pretibial wounds/cellulitis; R  foot non-pressure wound; MASD to sacrum & coccyx ? ?Last BM:  4/26 type 7 ? ?Height:  ? ?Ht Readings from Last 1 Encounters:  ?04/26/21 5' 4"  (1.626 m)  ? ? ?Weight:  ? ?Wt Readings from Last 1 Encounters:  ?05/03/21 59.1 kg  ? ? ?BMI:  Body mass index is 22.36 kg/m?. ? ?Estimated Nutritional Needs:  ? ?Kcal:  1700-1900 ? ?Protein:  95-110 gm ? ?Fluid:  1.7-1.9 L ? ? ? ?Lucas Mallow RD, LDN, CNSC ?Please refer to Amion for contact information.                                                       ? ?

## 2021-05-05 NOTE — Progress Notes (Signed)
eLink Physician-Brief Progress Note ?Patient Name: [name] ?DOB: [birth year] ?MRN: [id number] ? ? ?Date of Service ? 05/05/2021  ?HPI/Events of Note ? Patient at risk of attempting to climb out of bed and falling.out of bed in the process.  ?eICU Interventions ? Bilateral wrist restraints ordered.  ? ? ? ?  ? ?Thomasene Lot Adithi Gammon ?05/05/2021, 8:20 PM ?

## 2021-05-05 NOTE — Progress Notes (Signed)
An USGPIV (ultrasound guided PIV) has been placed for short-term vasopressor infusion. A correctly placed ivWatch must be used when administering Vasopressors. Should this treatment be needed beyond 72 hours, central line access should be obtained.  It will be the responsibility of the bedside nurse to follow best practice to prevent extravasations.   ?

## 2021-05-05 NOTE — Progress Notes (Signed)
? ?NAME:  [name], MRN:  [id number], DOB:  1960/04/18, LOS: 9 ?ADMISSION DATE:  04/26/2021, CONSULTATION DATE:  04/17 ?REFERRING MD:  EDP, CHIEF COMPLAINT:  Septic shock  ? ?History of Present Illness:  ?This is a 27 yof w/ h/o MS (walks w/ walker at baseline). She started being treated w/ Ocrelizumab back in Feb. Per pt and family about 4 weeks after began to notice increased RLE swelling, scaling and weakness. She attributed this to an expected side effects of the medication. Then about 5-7d prior to presentation she developed increased weakness, worsening right leg pain, poor PO intake, and neck pain. These symptoms continued to worsen to point pain was no longer bearable and she was so weak that she was no longer able to get OOB.  ?  ?In ER she was  hypotensive in 70s, tachycardic, encephalopathic, wbc ct 17.2, CO2 9 w/ + anion gap metabolic acidosis, BUN 203, creatinine 7.81 and K 6.2 ?Pertinent  Medical History  ?MS walks w/ walker, GERD, HLA B27 positive, Pulm nodule, seizure, sacroiliitis, ankylosing spondylitis, fibromyalgia ?Significant Hospital Events: ?Including procedures, antibiotic start and stop dates in addition to other pertinent events   ?4/17 admitted w/ septic shock working dx cellulitis (RLE), Cultures sent, MRI ordered C-spine, CT lower ext ordered, Zosyn and Vanc started  ?04/19-BiPAP placed. Another chest tube placed for persistent pneumothorax.  ?04/20-Lateral chest tube removed.  ?4/23-still with air leak in chest tube ?Mental status improved. Patient on HFNC.  ?04/25- Enlarging pneumothorax requiring another chest tube to be placed.  ? ?Interim History / Subjective:  ?Code stroke called overnight night due to nursing staff noting dysarthria and facial droop. Neurologist assessed at bedside but patient had no focal deficit. Patient states she is doing well. NAC. Does have a sore throat.  ? ?Review of Systems:   ?Negative unless stated in the subjective.  ?Objective   ?Blood pressure  (!) 97/51, pulse 96, temperature 98.9 ?F (37.2 ?C), temperature source Axillary, resp. rate 13, height 5' 4"  (1.626 m), weight 59.1 kg, SpO2 99 %. ?   ?FiO2 (%):  [90 %-100 %] 90 %  ? ?Intake/Output Summary (Last 24 hours) at 05/05/2021 0720 ?Last data filed at 05/05/2021 5597 ?Gross per 24 hour  ?Intake 1791.76 ml  ?Output 2493 ml  ?Net -701.24 ml  ? ?2.2 L urine output and 1x stool occurrence. ?Filed Weights  ? 05/01/21 0307 05/02/21 0234 05/03/21 0127  ?Weight: 59.2 kg 59.1 kg 59.1 kg  ? ?Examination: ?General: NAD ?HENT: dry mouth membrane, NCAT, on HFNC 25 L of 70 %  ?Lungs: wheeze present bilaterally, 2 chest tubes with air leak present ?Cardiovascular: tachycardic, sinus rhythm. 2+ radial pulses ?Abdomen: normal bowel sounds, no TTP ?Extremities: bilateral extremities wrapped. No asymmetery ?Neuro: alert and oriented x4, CNII-XII intact. Sensation intact in all extremities, strength intact in all extremities. No focal deficits.  ?Skin: Stage 2 sacral pressure ulcer  ?GU: catheter in place with light colored urine inside.  ? ?Diagnostics: ?Mag wnl. Leukocytosis at 10.7. Hgb at 8.6. Platletets wnl. AKI resolved. BUN elevated and creatinine normal.  ?Consults  ?Nephrology ?Resolved Hospital Problem list   ?Hyperkalemia s/p IVF and insulin ?Acute metabolic encephalopathy after renal fxn improvement ?AKI ?Leukocytosis ?Hypernatremia ?Assessment & Plan:  ?Sepsis secondary to bilateral lower extremity cellulitis ?Pneumonia ?Metabolic encephalopathy ?Leukocytosis ?-S/p ocrelizumab for MS as outpatient therapy.  ?-Lower extremity cellulitis felt to be related to ocrelizumab ?-Patient remains off pressors and off hydrocortisone. On ceftriaxone-will plan for 10 days  total (day 10/10). Will dc today.  ?-Blood cultures 4/17 negative to date, mild leukocytosis this am. Encephalopathy resolving as well but appears to be waxing and waning consistent with hospital delirium.  ?-CTM monitor her for signs and symptoms of infection  after abx discontinuation.  ? ?Acute hypoxemic respiratory failure  ?-Multifactorial, has chronic lung disease, along with deconditioning. Patient responded to diuresis yesterday with good UOP and improvement in breathing. Concern was present for pneumonia and she is on abx. Today is last day for abx.  ?-Continue to wean oxygen supplementation as tolerated, still requiring high flow oxygen ? ?Iatrogenic pneumothorax ?S/p 2 chest tubes. No active pneumothorax on CXR this am. Air leak still present from chest tube. Oxygen requirement improving.  ?-Continue chest tube to suction on 20 mmHg pressure.  ?-CTM ? ?Chronic obstructive pulmonary disease ?Tobacco use disorder ?Emphysema ?Chronic. Wheezing on exam this morning. Will add additional dose of duoneb.  ?-Duoneb nebs q 6hrs ?-Nicotine patch in place ? ?Hyperglycemia ?-CGB within range. Continue SSI ? ?Agitation/anxiety ?-Continue seroquel 25 mg morning and 50 mg at bedtime.  ?-Continue BuSpar 10 mg TID ?-Gabapentin 300 mg BID.  ?-Maintain pain medications ? ?Sacral Pressure Ulcer ?Stage 2 pressure ulcer noted. Will add protective foam pads and advise pressure offloading.  ?-CTM ? ?History of cavitary pulmonary nodule, left upper lobe ?-Interval decrease in size of nodule. No PFTs at baseline.  ?-Following up with Dr. Melvyn Novas in the outpatient setting, to follow-up as outpatient ? ? ?Best Practice (right click and "Reselect all SmartList Selections" daily)  ?Diet/type: NPO with ice chips ?DVT prophylaxis: prophylactic heparin  ?GI prophylaxis: N/A ?Lines: N/A ?Foley:  Yes ?Continuous: None ? ?Code Status:  full code ?Last date of multidisciplinary goals of care discussion. Will update family today.  ? ?Past Medical History:  ?She,  has a past medical history of Allergy, Anemia, GERD (gastroesophageal reflux disease), HLA B27 (HLA B27 positive), UTI (urinary tract infection), Hyperplastic colon polyp, Hypertension, IBS (irritable bowel syndrome), Multiple sclerosis ([place]),  and Seizures ([place]).  ?Surgical History:  ? ?Past Surgical History:  ?Procedure Laterality Date  ? APPENDECTOMY    ? BLADDER SURGERY  2007  ? CESAREAN SECTION    ? x 2   ? ORIF TOE FRACTURE Left 06/27/2013  ? Procedure: OPEN REDUCTION INTERNAL FIXATION (ORIF) LEFT FIFTH METATARSAL;  Surgeon: Wylene Simmer, MD;  Location: [facility];  Service: Orthopedics;  Laterality: Left;  ? PARTIAL HYSTERECTOMY  2006  ? TONSILLECTOMY    ? TUBAL LIGATION    ?  ?Social History:  ? reports that she has been smoking cigarettes. She has a 30.00 pack-year smoking history. She has never used smokeless tobacco. She reports current alcohol use of about 1.0 standard drink per week. She reports that she does not use drugs.  ?Family History:  ?Her family history includes Breast cancer in her maternal grandmother; Diabetes in her maternal aunt; Heart disease in her father; Irritable bowel syndrome in her brother and sister. There is no history of Colon cancer.  ?Allergies ?Allergies  ?Allergen Reactions  ? Lamictal [Lamotrigine] Rash  ? Other   ?  Cats: Rash  ?Shrimp scampi sauce: rash  ? Tramadol   ?  Lowers seizure threshold  ?  ?The patient is critically ill with multiple organ systems failure and requires high complexity decision making for assessment and support, frequent evaluation and titration of therapies, application of advanced monitoring technologies and extensive interpretation of multiple databases. Critical Care Time devoted  to patient care services described in this note independent of APP/resident time (if applicable)  is 31 minutes.  ? ?Idamae Schuller, MD ?Tillie Rung. [facility] ?Internal Medicine Residency, PGY-1  ?[phone] ?

## 2021-05-05 NOTE — Progress Notes (Signed)
Speech Language Pathology Treatment: Dysphagia  ?Patient Details ?Name: ELDINE RENCHER ?MRN: [id number] ?DOB: 1960-06-18 ?Today's Date: 05/05/2021 ?Time: 2683-4196 ?SLP Time Calculation (min) (ACUTE ONLY): 25 min ? ?Assessment / Plan / Recommendation ?Clinical Impression ? Patient seen by SLP for skilled therapy session focused on dysphagia goals/PO readiness. Her spouse arrived during session and  both were educated regarding POC regarding dysphagia. Patient significantly more alert, voice sounds clear and strong and she does not appear to be as confused but does appear with some anxiety. She was oriented x4. She was changed from HFNC to [place] and currently on 10L with SpO2 at 92-96%. SpO2 does drop to 80% rather quickly when nasal cannula is not in place. After oral care (patient brushing her own teeth) she tolerated straw sips of thin liquids (water) and a couple bites of puree (applesauce). She was initially apprehensive about drinking liquids and would only take small sips, however as she got more comfortable, she took more frequent and larger sips. She exhibited one instance of congested sounding cough but voice remained strong and clear and SpO2 and RR remained WFL. SLP is recommending to initiate Full liquids diet and SLP to continue to f/u for ability to upgrade solid textures.  ? ?  ?HPI HPI: Patient is a 61 y.o. female with PMH: MS, ANEMIA, GERD, HTN, IBS, seizures. She presented to the hospital c/o weakness and leg pain for 3 weeks. In the ER she was hypotensive, tachycardic and required vasopressors. She recieved 5/2 L fluid overnight and was hyperkalemic, in renal failure and had leukocytosis. CXR in ER showed pneumothorax and chest tube was placed. She is being treated for Septic shock due to cellulitis in setting of immunocompromised state improved. SLP swallow eval ordered due to RN concern for patient's ability to swallow PO's. ?  ?   ?SLP Plan ? Continue with current plan of care ? ?  ?   ?Recommendations for follow up therapy are one component of a multi-disciplinary discharge planning process, led by the attending physician.  Recommendations may be updated based on patient status, additional functional criteria and insurance authorization. ?  ? ?Recommendations  ?Diet recommendations: Thin liquid;Other(comment) (full liquids) ?Medication Administration: Whole meds with puree ?Supervision: Staff to assist with self feeding;Patient able to self feed;Full supervision/cueing for compensatory strategies ?Compensations: Slow rate;Small sips/bites;Minimize environmental distractions ?Postural Changes and/or Swallow Maneuvers: Seated upright 90 degrees;Upright 30-60 min after meal  ?   ?    ?   ? ? ? ? Oral Care Recommendations: Oral care BID ?Follow Up Recommendations: Other (comment) (TBD) ?Assistance recommended at discharge: Intermittent Supervision/Assistance ?SLP Visit Diagnosis: Dysphagia, unspecified (R13.10) ?Plan: Continue with current plan of care ? ? ? ? ?  ?  ? ? ?Angela Nevin, MA, CCC-SLP ?Speech Therapy ? ?

## 2021-05-05 NOTE — Plan of Care (Signed)
?  Problem: Education: ?Goal: Knowledge of General Education information will improve ?Description: Including pain rating scale, medication(s)/side effects and non-pharmacologic comfort measures ?Outcome: Progressing ?  ?Problem: Health Behavior/Discharge Planning: ?Goal: Ability to manage health-related needs will improve ?Outcome: Progressing ?  ?Problem: Clinical Measurements: ?Goal: Ability to maintain clinical measurements within normal limits will improve ?Outcome: Progressing ?Goal: Will remain free from infection ?Outcome: Progressing ?Goal: Diagnostic test results will improve ?Outcome: Progressing ?Goal: Respiratory complications will improve ?Outcome: Progressing ?Goal: Cardiovascular complication will be avoided ?Outcome: Progressing ?  ?Problem: Activity: ?Goal: Risk for activity intolerance will decrease ?Outcome: Progressing ?  ?Problem: Nutrition: ?Goal: Adequate nutrition will be maintained ?Outcome: Progressing ?  ?Problem: Elimination: ?Goal: Will not experience complications related to bowel motility ?Outcome: Progressing ?Goal: Will not experience complications related to urinary retention ?Outcome: Progressing ?  ?Problem: Pain Managment: ?Goal: General experience of comfort will improve ?Outcome: Progressing ?  ?Problem: Skin Integrity: ?Goal: Risk for impaired skin integrity will decrease ?Outcome: Progressing ?  ?Problem: Coping: ?Goal: Level of anxiety will decrease ?Outcome: Not Progressing ?  ?Problem: Safety: ?Goal: Ability to remain free from injury will improve ?Outcome: Not Progressing ?  ?

## 2021-05-05 NOTE — TOC Progression Note (Signed)
Transition of Care (TOC) - Progression Note  ? ? ?Patient Details  ?Name: [name] ?MRN: [id number] ?Date of Birth: 1960/12/27 ? ?Transition of Care (TOC) CM/SW Contact  ?Joanne Chars, LCSW ?Phone Number: ?05/05/2021, 12:14 PM ? ?Clinical Narrative:   [facility].  ? ? ? ?Expected Discharge Plan: [place] ([facility]) ?Barriers to Discharge: Other (must enter comment) (acceptance and insurance approval for LTAC) ? ?Expected Discharge Plan and Services ?Expected Discharge Plan: [place] ([facility]) ?In-house Referral: Clinical Social Work ?  ?Post Acute Care Choice: Long Term Acute Care (LTAC) ?Living arrangements for the past 2 months: [place] ?                ?  ?  ?  ?  ?  ?  ?  ?  ?  ?  ? ? ?Social Determinants of Health (SDOH) Interventions ?  ? ?Readmission Risk Interventions ?   ? View : No data to display.  ?  ?  ?  ? ? ?

## 2021-05-05 NOTE — Progress Notes (Signed)
RT to pt bedside for increased work of breathing. Pt with crackles bilateral and in obvious distress. Pt taken off of HFNC 10L and placed back on HHFNC 35L 100%. Pt initial SpO2 76% but did rise to 99%. FiO2 and Flowrate to be weaned as pt tolerates. PRN albuterol treatment given as well. Per Dr. Wynona Neat will hold off on bipap at this time. RT will continue to monitor and be available as needed.  ?

## 2021-05-06 ENCOUNTER — Inpatient Hospital Stay (HOSPITAL_COMMUNITY): Payer: Medicare Other

## 2021-05-06 DIAGNOSIS — R579 Shock, unspecified: Secondary | ICD-10-CM | POA: Diagnosis not present

## 2021-05-06 LAB — BASIC METABOLIC PANEL
Anion gap: 8 (ref 5–15)
BUN: 33 mg/dL — ABNORMAL HIGH (ref 8–23)
CO2: 29 mmol/L (ref 22–32)
Calcium: 8.6 mg/dL — ABNORMAL LOW (ref 8.9–10.3)
Chloride: 108 mmol/L (ref 98–111)
Creatinine, Ser: 0.51 mg/dL (ref 0.44–1.00)
GFR, Estimated: >60 mL/min (ref >60)
Glucose, Bld: 108 mg/dL — ABNORMAL HIGH (ref 70–99)
Potassium: 4.8 mmol/L (ref 3.5–5.1)
Sodium: 145 mmol/L (ref 135–145)

## 2021-05-06 LAB — CBC
HCT: 28.7 % — ABNORMAL LOW (ref 36.0–46.0)
Hemoglobin: 8.6 g/dL — ABNORMAL LOW (ref 12.0–15.0)
MCH: 28.6 pg (ref 26.0–34.0)
MCHC: 30 g/dL (ref 30.0–36.0)
MCV: 95.3 fL (ref 80.0–100.0)
Platelets: 224 10*3/uL (ref 150–400)
RBC: 3.01 MIL/uL — ABNORMAL LOW (ref 3.87–5.11)
RDW: 14.5 % (ref 11.5–15.5)
WBC: 12.7 10*3/uL — ABNORMAL HIGH (ref 4.0–10.5)
nRBC: 0 % (ref 0.0–0.2)

## 2021-05-06 LAB — TROPONIN I (HIGH SENSITIVITY)
Troponin I (High Sensitivity): 18 ng/L — ABNORMAL HIGH (ref <18)
Troponin I (High Sensitivity): 19 ng/L — ABNORMAL HIGH (ref <18)

## 2021-05-06 LAB — GLUCOSE, CAPILLARY
Glucose-Capillary: 121 mg/dL — ABNORMAL HIGH (ref 70–99)
Glucose-Capillary: 97 mg/dL (ref 70–99)
Glucose-Capillary: 98 mg/dL (ref 70–99)
Glucose-Capillary: 124 mg/dL — ABNORMAL HIGH (ref 70–99)
Glucose-Capillary: 167 mg/dL — ABNORMAL HIGH (ref 70–99)
Glucose-Capillary: 137 mg/dL — ABNORMAL HIGH (ref 70–99)

## 2021-05-06 MED ORDER — MIDAZOLAM HCL 2 MG/2ML IJ SOLN
INTRAMUSCULAR | Status: AC
  Administered 2021-05-06: 2 mg
  Filled 2021-05-06: qty 2

## 2021-05-06 MED ORDER — FENTANYL CITRATE (PF) 100 MCG/2ML IJ SOLN
INTRAMUSCULAR | Status: AC
  Administered 2021-05-06: 100 ug
  Filled 2021-05-06: qty 2

## 2021-05-06 MED ORDER — FUROSEMIDE 10 MG/ML IJ SOLN
20.0000 mg | Freq: Once | INTRAMUSCULAR | Status: AC
  Administered 2021-05-06: 20 mg via INTRAVENOUS

## 2021-05-06 MED ORDER — NUTRISOURCE FIBER PO PACK
1.0000 | PACK | Freq: Two times a day (BID) | ORAL | Status: DC
  Filled 2021-05-06 (×2): qty 1

## 2021-05-06 MED ORDER — LIDOCAINE HCL (PF) 1 % IJ SOLN
INTRAMUSCULAR | Status: AC
  Administered 2021-05-06: 1 mL
  Filled 2021-05-06: qty 5

## 2021-05-06 MED ORDER — PHENYLEPHRINE HCL-NACL 20-0.9 MG/250ML-% IV SOLN
0.0000 ug/min | INTRAVENOUS | Status: AC
  Administered 2021-05-07: 65 ug/min via INTRAVENOUS

## 2021-05-06 MED ORDER — MIDODRINE HCL 5 MG PO TABS
2.5000 mg | ORAL_TABLET | Freq: Three times a day (TID) | ORAL | Status: DC
  Administered 2021-05-06: 2.5 mg
  Filled 2021-05-06: qty 1

## 2021-05-06 MED ORDER — FUROSEMIDE 10 MG/ML IJ SOLN
INTRAMUSCULAR | Status: AC
  Filled 2021-05-06: qty 2

## 2021-05-06 MED ORDER — MENTHOL 3 MG MT LOZG
1.0000 | LOZENGE | Freq: Four times a day (QID) | OROMUCOSAL | Status: DC | PRN
  Filled 2021-05-06: qty 9

## 2021-05-06 NOTE — Progress Notes (Addendum)
PCCM Interval Progress Note ? ?Asked to see pt at bedside due to desaturation and increased WOB.  CXR showed mod to large R PTX, new from prior despite 2 chest tubes in place. ? ?On exam, slightly increased work of breathing. Sats in mid 68s. ?Chest tubes look to be in place, good positioning on CXR. ?Suction tubing disconnected and reconnected, no obstructions noted in tubing. ?100cc air manually aspirated out of apical tube then placed back no suction. ? ?Sats improved to 92%. ?WOB remains a bit increased. ? ?Don't think any benefit of 3rd tube at this point.  Will repeat CXR at 2100, E-MD aware to follow. If PTX persists and pt remains symptomatic, will re-evaluate with Dr. Gaynell Face and possibly place new tube aimed superiorly into area of PTX and remove pigtail considering likely not functioning (it appears to be positioned well per CXR; therefore, might not be functioning correctly). ? ?ADDENDUM 2120: ? ?Repeat CXR unchanged. On exam, pt certainly more comfortable and not in distress.  Discussed with Dr. Gaynell Face and given that she's had multiple chest tubes at this point, we feel CT would be best next step to evaluate parenchyma and better idea of current tube positions in relation to the PTX pocket. ?Hopefully pt remains stable overnight and ideally, day team can ask IR to evaluate for new tube under fluoro guidance in AM. ? ?CT ordered for now. Continue current chest tubes to suction and day team to re-evaluate in AM with consideration for IR consult. ? ? ?Rutherford Guys, PA - C ?[facility] ?For pager details, please see AMION or use Epic chat  ?After 1900, please call [facility] for cross coverage needs ?05/06/2021, 8:00 PM ? ?

## 2021-05-06 NOTE — Progress Notes (Signed)
? ?NAME:  [name], MRN:  [id number], DOB:  February 17, 1960, LOS: 10 ?ADMISSION DATE:  04/26/2021, CONSULTATION DATE:  04/17 ?REFERRING MD:  EDP, CHIEF COMPLAINT:  Septic shock  ? ?History of Present Illness:  ?This is a 58 yof w/ h/o MS (walks w/ walker at baseline). She started being treated w/ Ocrelizumab back in Feb. Per pt and family about 4 weeks after began to notice increased RLE swelling, scaling and weakness. She attributed this to an expected side effects of the medication. Then about 5-7d prior to presentation she developed increased weakness, worsening right leg pain, poor PO intake, and neck pain. These symptoms continued to worsen to point pain was no longer bearable and she was so weak that she was no longer able to get OOB.  ?  ?In ER she was  hypotensive in 70s, tachycardic, encephalopathic, wbc ct 17.2, CO2 9 w/ + anion gap metabolic acidosis, BUN 811, creatinine 7.81 and K 6.2 ?Pertinent  Medical History  ?MS walks w/ walker, GERD, HLA B27 positive, Pulm nodule, seizure, sacroiliitis, ankylosing spondylitis, fibromyalgia ?Significant Hospital Events: ?Including procedures, antibiotic start and stop dates in addition to other pertinent events   ?4/17 admitted w/ septic shock working dx cellulitis (RLE), Cultures sent, MRI ordered C-spine, CT lower ext ordered, Zosyn and Vanc started  ?04/19-BiPAP placed. Another chest tube placed for persistent pneumothorax.  ?04/20-Lateral chest tube removed.  ?4/23-still with air leak in chest tube ?Mental status improved. Patient on HFNC.  ?04/25- Enlarging pneumothorax requiring another chest tube to be placed.  ?04/26-Abx course completed.  ? ?Interim History / Subjective:  ?No acute complaints, states doing well. Throat is feeling better and her vaginal itching is better as well.  ? ?Review of Systems:   ?Negative unless stated in the subjective.  ?Objective   ?Blood pressure (!) 97/51, pulse 83, temperature 98 ?F (36.7 ?C), temperature source Axillary,  resp. rate 20, height 5' 4"  (1.626 m), weight 64 kg, SpO2 100 %. ?   ?FiO2 (%):  [70 %-100 %] 80 %  ? ?Intake/Output Summary (Last 24 hours) at 05/06/2021 0713 ?Last data filed at 05/06/2021 0603 ?Gross per 24 hour  ?Intake 1907.37 ml  ?Output 2550 ml  ?Net -642.63 ml  ? ?1.9 L urine output and 2x stool occurrence. ?Filed Weights  ? 05/02/21 0234 05/03/21 0127 05/06/21 0500  ?Weight: 59.1 kg 59.1 kg 64 kg  ? ?Examination: ?General: NAD ?HENT: MMM, NCAT, on HFNC. ?Lungs: rhonchi present bilaterally, bibasilar rales present, 2 chest tubes with air leak present ?Cardiovascular: tachycardic, sinus rhythm. 2+ radial pulses ?Abdomen: normal bowel sounds, no TTP ?Extremities: bilateral extremities wrapped. No asymmetery ?Neuro: alert and oriented x4, CNII-XII grossly intact. ?Skin: Stage 2 sacral pressure ulcer  ?GU: catheter in place with light colored urine inside.  ? ?Diagnostics: ?Mag wnl. Leukocytosis at 12.7. Hgb at 8.6. Platletets wnl. AKI resolved. BUN elevated and creatinine normal.  ?Consults  ?Nephrology ?Resolved Hospital Problem list   ?Hyperkalemia s/p IVF and insulin ?Acute metabolic encephalopathy after renal fxn improvement ?AKI ?Leukocytosis ?Hypernatremia ?Assessment & Plan:  ?Sepsis secondary to bilateral lower extremity cellulitis ?Pneumonia ?Metabolic encephalopathy ?Leukocytosis ?-S/p ocrelizumab for MS as outpatient therapy.  ?-Lower extremity cellulitis felt to be related to ocrelizumab ?-Patient was off pressors but phenylephrine gtt started yesterday and weaned off this am. Was on Ceftriaxone and last day was 05/06/21. ?-Blood cultures 4/17 negative to date, mild leukocytosis which is slightly worse this am. Encephalopathy resolving as well but appears to be waxing  and waning consistent with hospital delirium.  ?-CTM monitor her for signs and symptoms of infection after abx discontinuation.  ? ?Acute hypoxemic respiratory failure  ?-Multifactorial, has chronic lung disease, along with  deconditioning. Patient responded to diuresis yesterday with good UOP and improvement in breathing. Concern was present for pneumonia and she is on abx. Rales on today's exam, so we will continue with lasix today.  ?-Continue to wean oxygen supplementation as tolerated, still requiring high flow oxygen ? ?Iatrogenic pneumothorax ?S/p 2 chest tubes. Small apical pneumothorax on CXR this yesterday afternoon. Repeat CXR shows no pneumothorax. Air leak still present from chest tube. Oxygen requirement improving.  ?-Continue chest tube to suction on 40 mmHg pressure.  ?-Will reach out to CT Surgery to see options for her pneumothorax.  ?-CTM ? ?Chronic obstructive pulmonary disease ?Tobacco use disorder ?Emphysema ?Chronic. Appears to be complicating her presentation.  ?-Duoneb nebs q 6hrs ?-Nicotine patch in place ? ?Hyperglycemia ?-CGB within range. Continue SSI ? ?Agitation/anxiety ?-Continue seroquel 25 mg morning and 50 mg at bedtime.  ?-Continue BuSpar 10 mg TID ?-Gabapentin 300 mg BID.  ?-Maintain pain medications ? ?Sacral Pressure Ulcer ?Stage 2 pressure ulcer noted. Will add protective foam pads and advise pressure offloading.  ?-CTM ? ?History of cavitary pulmonary nodule, left upper lobe ?-Interval decrease in size of nodule. No PFTs at baseline.  ?-Following up with Dr. Melvyn Novas in the outpatient setting, to follow-up as outpatient ? ? ?Best Practice (right click and "Reselect all SmartList Selections" daily)  ?Diet/type: NPO with ice chips ?DVT prophylaxis: prophylactic heparin  ?GI prophylaxis: N/A ?Lines: N/A ?Foley:  Yes ?Continuous: None ? ?Code Status:  full code ?Last date of multidisciplinary goals of care discussion. Will update family today.  ? ?Past Medical History:  ?She,  has a past medical history of Allergy, Anemia, GERD (gastroesophageal reflux disease), HLA B27 (HLA B27 positive), UTI (urinary tract infection), Hyperplastic colon polyp, Hypertension, IBS (irritable bowel syndrome), Multiple  sclerosis ([place]), and Seizures ([place]).  ?Surgical History:  ? ?Past Surgical History:  ?Procedure Laterality Date  ? APPENDECTOMY    ? BLADDER SURGERY  2007  ? CESAREAN SECTION    ? x 2   ? ORIF TOE FRACTURE Left 06/27/2013  ? Procedure: OPEN REDUCTION INTERNAL FIXATION (ORIF) LEFT FIFTH METATARSAL;  Surgeon: Wylene Simmer, MD;  Location: [facility];  Service: Orthopedics;  Laterality: Left;  ? PARTIAL HYSTERECTOMY  2006  ? TONSILLECTOMY    ? TUBAL LIGATION    ?  ?Social History:  ? reports that she has been smoking cigarettes. She has a 30.00 pack-year smoking history. She has never used smokeless tobacco. She reports current alcohol use of about 1.0 standard drink per week. She reports that she does not use drugs.  ?Family History:  ?Her family history includes Breast cancer in her maternal grandmother; Diabetes in her maternal aunt; Heart disease in her father; Irritable bowel syndrome in her brother and sister. There is no history of Colon cancer.  ?Allergies ?Allergies  ?Allergen Reactions  ? Lamictal [Lamotrigine] Rash  ? Other   ?  Cats: Rash  ?Shrimp scampi sauce: rash  ? Tramadol   ?  Lowers seizure threshold  ?  ?The patient is critically ill with multiple organ systems failure and requires high complexity decision making for assessment and support, frequent evaluation and titration of therapies, application of advanced monitoring technologies and extensive interpretation of multiple databases. Critical Care Time devoted to patient care services described in this  note independent of APP/resident time (if applicable)  is 31 minutes.  ? ?Idamae Schuller, MD ?Tillie Rung. [facility] ?Internal Medicine Residency, PGY-1  ?[phone] ?

## 2021-05-06 NOTE — Progress Notes (Signed)
Called to bedside to assess patient ? ?CXR shows increasing pneumothorax and bedside US was inconclusive from my evaluation ? ?No air leak noted in chest tube at time of assessment ? ?Decision made to place large bore tube, d/w patient and patients spouse ? ?Additional cc time 88min ?

## 2021-05-06 NOTE — Progress Notes (Signed)
Physical Therapy Treatment ?Patient Details ?Name: [name] ?MRN: [id number] ?DOB: Dec 30, 1960 ?Today's Date: 05/06/2021 ? ? ?History of Present Illness Pt adm 4/17 with septic shock due to RLE cellulitis and PNA. Pt developed acute hypoxic respiratory failure and placed on bipap 4/19. Developed iatrogenic pneumothorax and chest tube placed. PMH - Multiple Sclerosis, seizure, ankylosing spondylitis, fibromyalgia ? ?  ?PT Comments  ? ? Pt making slow progress. Able to work on upright sitting and trunk strengthening by working in chair position with bed. Pt continues to be limited by respiratory status. Continue to recommend LTACH.    ?Recommendations for follow up therapy are one component of a multi-disciplinary discharge planning process, led by the attending physician.  Recommendations may be updated based on patient status, additional functional criteria and insurance authorization. ? ?Follow Up Recommendations ? PT at [facility] acute care hospital ?  ?  ?Assistance Recommended at Discharge Frequent or constant Supervision/Assistance  ?Patient can return home with the following Two people to help with walking and/or transfers;Two people to help with bathing/dressing/bathroom;Assistance with cooking/housework;Assist for transportation;Help with stairs or ramp for entrance ?  ?Equipment Recommendations ? Hospital bed;Wheelchair (measurements PT);Wheelchair cushion (measurements PT) (hoyer lift)  ?  ?Recommendations for Other Services   ? ? ?  ?Precautions / Restrictions Precautions ?Precautions: Fall;Other (comment) ?Precaution Comments: watch SpO2 ?Restrictions ?Weight Bearing Restrictions: No  ?  ? ?Mobility ? Bed Mobility ?Overal bed mobility: Needs Assistance ?  ?  ?  ?  ?  ?  ?General bed mobility comments: Placed bed in chair position and pt able to pull herself forward into upright sitting with unsupported back with min guard assist. ?  ? ?Transfers ?  ?  ?  ?  ?  ?  ?  ?  ?  ?  ?  ? ?Ambulation/Gait ?  ?   ?  ?  ?  ?  ?  ?  ? ? ?Stairs ?  ?  ?  ?  ?  ? ? ?Wheelchair Mobility ?  ? ?Modified Rankin (Stroke Patients Only) ?  ? ? ?  ?Balance Overall balance assessment: Needs assistance ?Sitting-balance support: Bilateral upper extremity supported, Feet unsupported, Single extremity supported ?Sitting balance-Leahy Scale: Poor ?Sitting balance - Comments: Pt able to maintain upright sitting with trunk away from bed using UE support ?Postural control: Posterior lean ?  ?  ?  ?  ?  ?  ?  ?  ?  ?  ?  ?  ?  ?  ?  ? ?  ?Cognition Arousal/Alertness: Awake/alert ?Behavior During Therapy: Anxious ?Overall Cognitive Status: Impaired/Different from baseline ?Area of Impairment: Attention, Following commands, Awareness, Problem solving, Safety/judgement ?  ?  ?  ?  ?  ?  ?  ?  ?  ?Current Attention Level: Selective ?  ?Following Commands: Follows one step commands consistently, Follows one step commands with increased time ?Safety/Judgement: Decreased awareness of deficits ?Awareness: Emergent ?Problem Solving: Slow processing, Decreased initiation, Requires verbal cues ?  ?  ?  ? ?  ?Exercises General Exercises - Lower Extremity ?Ankle Circles/Pumps: AAROM, Both, 10 reps, Supine ?Long Arc Quad: AAROM, Both, 10 reps, Seated ?Other Exercises ?Other Exercises: Pulling trunk forward from bed x 10 ?Other Exercises: Reaching with UE's 5 x 2 reps ?Other Exercises: Trunk rotation in sitting forward x 5 ?Other Exercises: Facilitated trunk extension x 5 in sitting forward ? ?  ?General Comments General comments (skin integrity, edema, etc.): VSS on 35L HHFNC ?  ?  ? ?  Pertinent Vitals/Pain Pain Assessment ?Pain Assessment: No/denies pain  ? ? ?Home Living   ?  ?  ?  ?  ?  ?  ?  ?  ?  ?   ?  ?Prior Function    ?  ?  ?   ? ?PT Goals (current goals can now be found in the care plan section) Progress towards PT goals: Progressing toward goals ? ?  ?Frequency ? ? ? Min 3X/week ? ? ? ?  ?PT Plan Current plan remains appropriate  ? ? ?Co-evaluation    ?  ?  ?  ?  ? ?  ?AM-PAC PT "6 Clicks" Mobility   ?Outcome Measure ? Help needed turning from your back to your side while in a flat bed without using bedrails?: Total ?Help needed moving from lying on your back to sitting on the side of a flat bed without using bedrails?: Total ?Help needed moving to and from a bed to a chair (including a wheelchair)?: Total ?Help needed standing up from a chair using your arms (e.g., wheelchair or bedside chair)?: Total ?Help needed to walk in hospital room?: Total ?Help needed climbing 3-5 steps with a railing? : Total ?6 Click Score: 6 ? ?  ?End of Session Equipment Utilized During Treatment: Oxygen ?Activity Tolerance: Patient limited by fatigue ?Patient left: in bed;with call bell/phone within reach ?Nurse Communication: Mobility status ?PT Visit Diagnosis: Other abnormalities of gait and mobility (R26.89);Muscle weakness (generalized) (M62.81) ?  ? ? ?Time: [id number] ?PT Time Calculation (min) (ACUTE ONLY): 15 min ? ?Charges:  $Therapeutic Activity: 8-22 mins          ?          ? ?[facility] PT ?Acute Rehabilitation Services ?Office [phone] ? ? ? ?Shary Decamp [facility] ?05/06/2021, 10:48 AM ? ?

## 2021-05-06 NOTE — Progress Notes (Signed)
SLP Cancellation Note ? ?Patient Details ?Name: [name] ?MRN: [id number] ?DOB: [birth year] ? ? ?Cancelled treatment:       Reason Eval/Treat Not Completed: Medical issues which prohibited therapy;Other (comment) (patient back on HFNC currently at 40L, having chest tube replaced. SLP will continue to follow for ability to take PO's) ? ? ?Angela Nevin, MA, CCC-SLP ?Speech Therapy ? ?

## 2021-05-06 NOTE — Procedures (Signed)
Insertion of Chest Tube Procedure Note ? ?[name]  ?[id number]  ?[birth year] ? ?Date:05/06/21  ?Time:12:52 PM  ? ? ?Provider Performing: Tomma Lightning  ? ?Procedure: Chest Tube Insertion (25852) ? ?Indication(s) ?Pneumothorax ? ?Consent ?Risks of the procedure as well as the alternatives and risks of each were explained to the patient and/or caregiver.  Consent for the procedure was obtained and is signed in the bedside chart ? ?Anesthesia ?Topical only with 1% lidocaine  ? ? ?Time Out ?Verified patient identification, verified procedure, site/side was marked, verified correct patient position, special equipment/implants available, medications/allergies/relevant history reviewed, required imaging and test results available. ? ? ?Sterile Technique ?Maximal sterile technique including full sterile barrier drape, hand hygiene, sterile gown, sterile gloves, mask, hair covering, sterile ultrasound probe cover (if used). ? ? ?Procedure Description ?Area of placement cleaned and draped in sterile fashion.  A 28 French chest tube was placed into the right pleural space using Kelly dissection. Appropriate return of air was obtained.  The tube was connected to atrium and placed on -20 cm H2O wall suction. ? ? ?Complications/Tolerance ?None; patient tolerated the procedure well. ?Chest X-ray is ordered to verify placement. ? ? ?EBL ?Minimal ? ?Specimen(s) ?none ?

## 2021-05-06 NOTE — Progress Notes (Addendum)
eLink Physician-Brief Progress Note ?Patient Name: [name] ?DOB: 07/21/1960 ?MRN: [id number] ? ? ?Date of Service ? 05/06/2021  ?HPI/Events of Note ? Patient with abrupt development of shortness of breath and a drop in her saturation from the 93 % range to 86 %. Per bedside RN she has diminished breath sounds on the right  (which is her baseline), and bilateral crackles. Patient did complain of some chest discomfort which has abated, EKG shows sinus tachycardia but is otherwise unremarkable.  ?eICU Interventions ? Stat portable CXR to r/o pneumothorax vs pulmonary edema, 12 lead EKG, Trend Troponin, Lasix 20 mg iv x 1,  patient placed on 100 % oxygen and saturation has climbed back up to 95 %.  ? ? ? ?  ? ?[name] ?05/06/2021, 7:23 PM ?

## 2021-05-07 ENCOUNTER — Inpatient Hospital Stay (HOSPITAL_COMMUNITY): Payer: Medicare Other

## 2021-05-07 DIAGNOSIS — A419 Sepsis, unspecified organism: Secondary | ICD-10-CM

## 2021-05-07 DIAGNOSIS — J9311 Primary spontaneous pneumothorax: Secondary | ICD-10-CM | POA: Diagnosis not present

## 2021-05-07 DIAGNOSIS — R6521 Severe sepsis with septic shock: Secondary | ICD-10-CM | POA: Diagnosis not present

## 2021-05-07 DIAGNOSIS — J9601 Acute respiratory failure with hypoxia: Secondary | ICD-10-CM

## 2021-05-07 LAB — BASIC METABOLIC PANEL
Anion gap: 9 (ref 5–15)
Anion gap: 6 (ref 5–15)
BUN: 28 mg/dL — ABNORMAL HIGH (ref 8–23)
BUN: 24 mg/dL — ABNORMAL HIGH (ref 8–23)
CO2: 28 mmol/L (ref 22–32)
CO2: 31 mmol/L (ref 22–32)
Calcium: 8.5 mg/dL — ABNORMAL LOW (ref 8.9–10.3)
Calcium: 8.7 mg/dL — ABNORMAL LOW (ref 8.9–10.3)
Chloride: 102 mmol/L (ref 98–111)
Chloride: 106 mmol/L (ref 98–111)
Creatinine, Ser: 0.6 mg/dL (ref 0.44–1.00)
Creatinine, Ser: 0.58 mg/dL (ref 0.44–1.00)
GFR, Estimated: >60 mL/min (ref >60)
GFR, Estimated: >60 mL/min (ref >60)
Glucose, Bld: 106 mg/dL — ABNORMAL HIGH (ref 70–99)
Glucose, Bld: 98 mg/dL (ref 70–99)
Potassium: 5.5 mmol/L — ABNORMAL HIGH (ref 3.5–5.1)
Potassium: 5 mmol/L (ref 3.5–5.1)
Sodium: 139 mmol/L (ref 135–145)
Sodium: 143 mmol/L (ref 135–145)

## 2021-05-07 LAB — CBC
HCT: 32.5 % — ABNORMAL LOW (ref 36.0–46.0)
Hemoglobin: 10.1 g/dL — ABNORMAL LOW (ref 12.0–15.0)
MCH: 28.9 pg (ref 26.0–34.0)
MCHC: 31.1 g/dL (ref 30.0–36.0)
MCV: 92.9 fL (ref 80.0–100.0)
Platelets: 317 10*3/uL (ref 150–400)
RBC: 3.5 MIL/uL — ABNORMAL LOW (ref 3.87–5.11)
RDW: 15.2 % (ref 11.5–15.5)
WBC: 16.4 10*3/uL — ABNORMAL HIGH (ref 4.0–10.5)
nRBC: 0 % (ref 0.0–0.2)

## 2021-05-07 LAB — GLUCOSE, CAPILLARY
Glucose-Capillary: 122 mg/dL — ABNORMAL HIGH (ref 70–99)
Glucose-Capillary: 115 mg/dL — ABNORMAL HIGH (ref 70–99)
Glucose-Capillary: 101 mg/dL — ABNORMAL HIGH (ref 70–99)
Glucose-Capillary: 119 mg/dL — ABNORMAL HIGH (ref 70–99)
Glucose-Capillary: 101 mg/dL — ABNORMAL HIGH (ref 70–99)
Glucose-Capillary: 164 mg/dL — ABNORMAL HIGH (ref 70–99)

## 2021-05-07 LAB — PROCALCITONIN: Procalcitonin: 0.31 ng/mL

## 2021-05-07 MED ORDER — SODIUM CHLORIDE 0.9 % IV SOLN
3.0000 g | Freq: Three times a day (TID) | INTRAVENOUS | Status: AC
  Administered 2021-05-07 – 2021-05-12 (×15): 3 g via INTRAVENOUS
  Filled 2021-05-07 (×15): qty 8

## 2021-05-07 MED ORDER — ACETAMINOPHEN 160 MG/5ML PO SOLN
650.0000 mg | Freq: Four times a day (QID) | ORAL | Status: DC | PRN
  Administered 2021-05-07 – 2021-05-15 (×5): 650 mg via ORAL
  Filled 2021-05-07 (×6): qty 20.3

## 2021-05-07 MED ORDER — BUDESONIDE 0.5 MG/2ML IN SUSP
0.5000 mg | Freq: Two times a day (BID) | RESPIRATORY_TRACT | Status: DC
  Administered 2021-05-07 – 2021-05-15 (×18): 0.5 mg via RESPIRATORY_TRACT
  Filled 2021-05-07 (×18): qty 2

## 2021-05-07 MED ORDER — MIDODRINE HCL 5 MG PO TABS
5.0000 mg | ORAL_TABLET | Freq: Three times a day (TID) | ORAL | Status: DC
  Administered 2021-05-07 – 2021-05-11 (×13): 5 mg
  Filled 2021-05-07 (×13): qty 1

## 2021-05-07 MED ORDER — LACTATED RINGERS IV BOLUS
500.0000 mL | Freq: Once | INTRAVENOUS | Status: AC
  Administered 2021-05-07: 500 mL via INTRAVENOUS

## 2021-05-07 MED ORDER — ARFORMOTEROL TARTRATE 15 MCG/2ML IN NEBU
15.0000 ug | INHALATION_SOLUTION | Freq: Two times a day (BID) | RESPIRATORY_TRACT | Status: DC
  Administered 2021-05-07 – 2021-05-15 (×18): 15 ug via RESPIRATORY_TRACT
  Filled 2021-05-07 (×19): qty 2

## 2021-05-07 MED ORDER — PHENYLEPHRINE HCL-NACL 20-0.9 MG/250ML-% IV SOLN
0.0000 ug/min | INTRAVENOUS | Status: DC
  Administered 2021-05-07: 45 ug/min via INTRAVENOUS
  Administered 2021-05-07: 70 ug/min via INTRAVENOUS
  Administered 2021-05-08: 85 ug/min via INTRAVENOUS
  Filled 2021-05-07: qty 250
  Filled 2021-05-07: qty 500

## 2021-05-07 MED ORDER — LACTATED RINGERS IV SOLN: INTRAVENOUS | Status: DC

## 2021-05-07 MED ORDER — MIDODRINE HCL 5 MG PO TABS: 5.0000 mg | ORAL_TABLET | Freq: Three times a day (TID) | ORAL | Status: DC

## 2021-05-07 NOTE — Progress Notes (Signed)
PCCM Interval Progress Note ? ?Discussed with Dr Ladona Ridgel from radiology after CT chest. ? ?Large right PTX persists.  Pigtail catheter in appropriate position.  Lateral large bore in RLL with tip possibly occluded. ? ?Pt clinically looking much better and more comfortable. Vitals improved from earlier, still on 100% NRB + 40L HFNC. ? ?Discussed with Dr. Gaynell Face again.  Given that pt clinically doing better, we will defer additional blind chest tube if possible and ask our day team to consult with IR for assistance with fluoro guidance first thing in the morning.  Large bore chest tube might also need repositioning. ? ?RN asked to gather supplies for chest tube and sahara and have them available at bedside in case pts clinical picture changes at all and shows signs of decline which will require emergent new chest tube placement. RN aware to call STAT if any changes. ? ? ?Rutherford Guys, PA - C ?[facility] ?For pager details, please see AMION or use Epic chat  ?After 1900, please call [facility] for cross coverage needs ?05/07/2021, 2:55 AM ? ?

## 2021-05-07 NOTE — Progress Notes (Signed)
eLink Physician-Brief Progress Note ?Patient Name: [name] ?DOB: 11-03-1960 ?MRN: [id number] ? ? ?Date of Service ? 05/07/2021  ?HPI/Events of Note ? CT chest resulted.  ?eICU Interventions ? Result reviewed and discussed with PCCM ground crew.  ? ? ? ?  ? ?[name] ?05/07/2021, 3:05 AM ?

## 2021-05-07 NOTE — Progress Notes (Signed)
Occupational Therapy Treatment ?Patient Details ?Name: [name] ?MRN: [id number] ?DOB: 27-Nov-1960 ?Today's Date: 05/07/2021 ? ? ?History of present illness Pt adm 4/17 with septic shock due to RLE cellulitis and PNA. Pt developed acute hypoxic respiratory failure and placed on bipap 4/19. Developed iatrogenic pneumothorax and chest tube placed. PMH - Multiple Sclerosis, seizure, ankylosing spondylitis, fibromyalgia ?  ?OT comments ? Cleared with RN for EOB activities emphasizing sitting balance, endurance and trunk control. Pt continues to require Max A x 2 for bed mobility but able to hold sitting balance with no more than Min A today. Pt with deficits in initiating, sequencing and attending to tasks - question involvement of anxiety vs cognitive deficits. Encouraged use of flutter valve and incentive spirometer (able to pull <482mL) throughout the day. DC recs remain appropriate. ? ?30 L, 80% FiO2 with SpO2 89% at rest, improving to 97% after activity.  ?BP pre activity: 109/57 ?BP during activity: 100/64 ?BP post activity: 94/64  ? ?Recommendations for follow up therapy are one component of a multi-disciplinary discharge planning process, led by the attending physician.  Recommendations may be updated based on patient status, additional functional criteria and insurance authorization. ?   ?Follow Up Recommendations ? OT at Long-term acute care hospital  ?  ?Assistance Recommended at Discharge Frequent or constant Supervision/Assistance  ?Patient can return home with the following ? Two people to help with walking and/or transfers;A lot of help with bathing/dressing/bathroom ?  ?Equipment Recommendations ? Hospital bed  ?  ?Recommendations for Other Services   ? ?  ?Precautions / Restrictions Precautions ?Precautions: Fall;Other (comment) ?Precaution Comments: watch SpO2, 2 chest tubes, flexiseal ?Restrictions ?Weight Bearing Restrictions: No  ? ? ?  ? ?Mobility Bed Mobility ?Overal bed mobility: Needs  Assistance ?Bed Mobility: Supine to Sit, Sit to Supine ?  ?  ?Supine to sit: Max assist, +2 for safety/equipment, +2 for physical assistance, HOB elevated ?Sit to supine: Max assist, +2 for physical assistance, +2 for safety/equipment ?  ?General bed mobility comments: Poor initiation and attention to bed mobility tasks requiring Max A x 2 ?  ? ?Transfers ?  ?  ?  ?  ?  ?  ?  ?  ?  ?General transfer comment: Attempted scooting along bedside with Max A x 2 with pt assisting to scoot hips back on bed but too fatigued to clear hips ?  ?  ?Balance Overall balance assessment: Needs assistance ?Sitting-balance support: Bilateral upper extremity supported, Feet unsupported, Single extremity supported ?Sitting balance-Leahy Scale: Poor ?Sitting balance - Comments: initially fair but with fatigue, requires Min A or UE support. impulsively leans forward/backward at times ?  ?  ?  ?  ?  ?  ?  ?  ?  ?  ?  ?  ?  ?  ?  ?   ? ?ADL either performed or assessed with clinical judgement  ? ?ADL Overall ADL's : Needs assistance/impaired ?  ?  ?Grooming: Minimal assistance;Sitting ?Grooming Details (indicate cue type and reason): attempted to engage in washing face EOB, able to reach face but unable to attend to task ?  ?  ?  ?  ?  ?  ?  ?  ?  ?  ?  ?  ?  ?  ?  ?General ADL Comments: Emphasis on EOB balance, endurance and postural control ?  ? ?Extremity/Trunk Assessment Upper Extremity Assessment ?Upper Extremity Assessment: Generalized weakness ?  ?Lower Extremity Assessment ?Lower Extremity Assessment: Defer to PT  evaluation ?  ?  ?  ? ?Vision   ?Vision Assessment?: No apparent visual deficits ?  ?Perception   ?  ?Praxis   ?  ? ?Cognition Arousal/Alertness: Awake/alert ?Behavior During Therapy: Anxious ?Overall Cognitive Status: Impaired/Different from baseline ?Area of Impairment: Attention, Following commands, Awareness, Problem solving, Safety/judgement ?  ?  ?  ?  ?  ?  ?  ?  ?  ?Current Attention Level: Selective ?  ?Following  Commands: Follows one step commands consistently, Follows one step commands with increased time ?Safety/Judgement: Decreased awareness of deficits ?Awareness: Emergent ?Problem Solving: Slow processing, Decreased initiation, Requires verbal cues ?General Comments: Pt very anxious with activity and difficult to redirect at times. Some intelligible speech; cognitive deficits vs anxiety? ?  ?  ?   ?Exercises Exercises: Other exercises ?Other Exercises ?Other Exercises: flutter valve x 5 ?Other Exercises: incentive spiromater x 5 (<44mL) ? ?  ?Shoulder Instructions   ? ? ?  ?General Comments RN cleared pt for EOB attempts. Husband initially present but left to sit in waiting room. Updated on activities after session.  ? ? ?Pertinent Vitals/ Pain       Pain Assessment ?Pain Assessment: Faces ?Faces Pain Scale: Hurts little more ?Pain Location: lower BLE (bandaged areas) ?Pain Descriptors / Indicators: Grimacing, Guarding ?Pain Intervention(s): Monitored during session ? ?Home Living   ?  ?  ?  ?  ?  ?  ?  ?  ?  ?  ?  ?  ?  ?  ?  ?  ?  ?  ? ?  ?Prior Functioning/Environment    ?  ?  ?  ?   ? ?Frequency ? Min 2X/week  ? ? ? ? ?  ?Progress Toward Goals ? ?OT Goals(current goals can now be found in the care plan section) ? Progress towards OT goals: Progressing toward goals ? ?Acute Rehab OT Goals ?Patient Stated Goal: improve breathing and pain in legs ?OT Goal Formulation: With patient ?Time For Goal Achievement: 05/18/21 ?Potential to Achieve Goals: Fair ?ADL Goals ?Pt Will Perform Upper Body Bathing: with min guard assist;sitting ?Pt Will Perform Lower Body Bathing: with min assist;sit to/from stand ?Pt Will Transfer to Toilet: with min assist;stand pivot transfer;bedside commode ?Additional ADL Goal #1: Pt to implement at least 3 energy conservation strategies during functional tasks ?Additional ADL Goal #2: Pt to improve activity tolerance > 5 min with stable vitals and decreased anxiety  ?Plan Discharge plan remains  appropriate   ? ?Co-evaluation ? ? ?   ?  ?  ?  ?  ? ?  ?AM-PAC OT "6 Clicks" Daily Activity     ?Outcome Measure ? ? Help from another person eating meals?: A Little ?Help from another person taking care of personal grooming?: A Little ?Help from another person toileting, which includes using toliet, bedpan, or urinal?: Total ?Help from another person bathing (including washing, rinsing, drying)?: A Lot ?Help from another person to put on and taking off regular upper body clothing?: A Lot ?Help from another person to put on and taking off regular lower body clothing?: Total ?6 Click Score: 12 ? ?  ?End of Session Equipment Utilized During Treatment: Oxygen ? ?OT Visit Diagnosis: Unsteadiness on feet (R26.81);Other abnormalities of gait and mobility (R26.89);Muscle weakness (generalized) (M62.81) ?  ?Activity Tolerance Patient limited by fatigue ?  ?Patient Left in bed;with call bell/phone within reach;with bed alarm set ?  ?Nurse Communication Mobility status ?  ? ?   ? ?  Time: 6759-1638 ?OT Time Calculation (min): 26 min ? ?Charges: OT General Charges ?$OT Visit: 1 Visit ?OT Treatments ?$Self Care/Home Management : 8-22 mins ?$Therapeutic Activity: 8-22 mins ? ?Bradd Canary, OTR/L ?Acute Rehab Services ?Office: [phone]  ? ?Lorre Munroe ?05/07/2021, 2:08 PM ?

## 2021-05-07 NOTE — Progress Notes (Signed)
61 year old woman with multiple sclerosis and ankylosing spondylitis admitted 4/17 with leg wounds, septic shock and AKI ?She has a history of left upper lobe cavitary lesion that was being followed as outpatient ?Course was complicated by right iatrogenic pneumothorax requiring pigtail's x2, large bore chest tube placed 4/27 due to enlarging pneumothorax. ? ?Overnight CT chest was obtained which I reviewed which shows large right pneumothorax and chest tube tip seems to be occluded by lung ? ?She is critically ill, on high flow oxygen 80% / 30 L ?Good urine output. ?On exam -decreased breath sounds apical and posterior, subcutaneous crepitus over right-sided chest wall, across midline on left, confused but follows one-step commands, very weak and deconditioned, soft and nontender abdomen, 1+ edema. ?S1-S2 regular ? ?Labs show normal renal function, mild hyperkalemia, low procalcitonin 0.31, mild increase in leukocytosis. ? ?Impression/plan ? ?Acute respiratory failure with hypoxia -underlying emphysema, treated for pneumonia with ceftriaxone, no pneumothorax contributing ?-Decrease high flow oxygen, maintain saturation 95% and above ? ?Septic shock -cultures negative, treated for lower extremity cellulitis, off antibiotics since 4/27, procalcitonin is low but white count slightly increasing and back on pressors since 4/27 which could be related to pneumothorax, will restart antibiotics if does not come off pressors again. ?-Cavitary lesion left upper lobe appears unchanged ? ?Iatrogenic right pneumothorax -chest x-ray this morning showed that lung was down, I adjusted large bore chest tube by pull it out to 12 cm at the skin and resutured, large air leak was obtained and repeat chest x-ray shows resolution of right pneumothorax. ?We will involve TCTS for persistent air leak ? ?My independent critical care time was 40 minutes ? ?Comer Locket Vassie Loll MD ? ?

## 2021-05-07 NOTE — Progress Notes (Signed)
? ?NAME:  [name], MRN:  [id number], DOB:  [birth year], LOS: 106 ?ADMISSION DATE:  04/26/2021, CONSULTATION DATE:  04/17 ?REFERRING MD:  EDP, CHIEF COMPLAINT:  Septic shock  ? ?History of Present Illness:  ?This is a 28 yof w/ h/o MS (walks w/ walker at baseline). She started being treated w/ Ocrelizumab back in Feb. Per pt and family about 4 weeks after began to notice increased RLE swelling, scaling and weakness. She attributed this to an expected side effects of the medication. Then about 5-7d prior to presentation she developed increased weakness, worsening right leg pain, poor PO intake, and neck pain. These symptoms continued to worsen to point pain was no longer bearable and she was so weak that she was no longer able to get OOB.  ?  ?In ER she was  hypotensive in 70s, tachycardic, encephalopathic, wbc ct 17.2, CO2 9 w/ + anion gap metabolic acidosis, BUN 696, creatinine 7.81 and K 6.2 ?Pertinent  Medical History  ?MS walks w/ walker, GERD, HLA B27 positive, Pulm nodule, seizure, sacroiliitis, ankylosing spondylitis, fibromyalgia ?Significant Hospital Events: ?Including procedures, antibiotic start and stop dates in addition to other pertinent events   ?4/17 admitted w/ septic shock working dx cellulitis (RLE), Cultures sent, MRI ordered C-spine, CT lower ext ordered, Zosyn and Vanc started  ?04/19-BiPAP placed. Another chest tube placed for persistent pneumothorax.  ?04/20-Lateral chest tube removed.  ?4/23-still with air leak in chest tube ?Mental status improved. Patient on HFNC.  ?04/25- Enlarging pneumothorax requiring another chest tube to be placed.  ?04/26-Abx course completed.  ?04/27-Large bore IV placed.  ?08/28-CT surgery consulted.  ? ?Interim History / Subjective:  ?Had desaturation yesterday and night team called. Assessed at bedside and repeat imaging showed occlusion of large bore chest tube. Patient's status improved without intervention. Denying any concerns at bedside.  ?Review of  Systems:   ?Negative unless stated in the subjective.  ?Objective   ?Blood pressure (!) 116/55, pulse 94, temperature 98.5 ?F (36.9 ?C), temperature source Axillary, resp. rate (!) 28, height 5' 4"  (1.626 m), weight 62.1 kg, SpO2 97 %. ?   ?FiO2 (%):  [40 %-100 %] 80 %  ? ?Intake/Output Summary (Last 24 hours) at 05/07/2021 0708 ?Last data filed at 05/07/2021 0600 ?Gross per 24 hour  ?Intake 2363.67 ml  ?Output 2010 ml  ?Net 353.67 ml  ? ?1.6 L urine output and 350 cc stool occurrence. ?Filed Weights  ? 05/03/21 0127 05/06/21 0500 05/07/21 0500  ?Weight: 59.1 kg 64 kg 62.1 kg  ? ?Examination: ?General: NAD ?HENT: NCAT, on HFNC. ?Lungs: rhonchi present bilaterally, wheeze present, 2 chest tubes, large bore with air leak. Crepitus on bilateral upper chest.  ?Cardiovascular: tachycardic, sinus rhythm. 2+ radial pulses ?Abdomen: normal bowel sounds, no TTP ?Extremities: bilateral extremities wrapped. No asymmetery ?Neuro: alert and oriented x4 ?Skin: Stage 2 sacral pressure ulcer  ?GU: catheter in place with light colored urine inside.  ? ?Diagnostics: ?Mag wnl. Leukocytosis at 16.4. Hgb at 10.1 [place] 317. AKI resolved. BUN elevated and creatinine normal.  ?Consults  ?Nephrology ?Resolved Hospital Problem list   ?Hyperkalemia s/p IVF and insulin ?Acute metabolic encephalopathy after renal fxn improvement ?AKI ?Leukocytosis ?Hypernatremia ?Assessment & Plan:  ?Acute hypoxemic respiratory failure  ?Iatrogenic pneumothorax ?Sepsis 2/2 to Pneumonia ?Leukocytosis ?-Multifactorial, has chronic lung disease, along with deconditioning. Her pneumothorax intermittent worsening playing a role as well. She is s/p 2 chest tubes. Patient responded to diuresis yesterday with good UOP and improvement in breathing. Concern was  present for pneumonia and she has completed abx. Given new leukocytosis, will start Unasyn. Patient was off pressors but phenylephrine gtt started 04/26 with intermittent requirement.  ?-Continue to wean oxygen  supplementation as tolerated, still requiring high flow oxygen ?-Will continue suction on both chest tubes ?-Plan for CT Surgery consult given recurrent pneumothorax.  ? ?Bilateral lower extremity cellulitis ?-S/p ocrelizumab for MS as outpatient therapy.  ?-Lower extremity cellulitis felt to be related to ocrelizumab ?-Was on Ceftriaxone and last day was 05/06/21. ?-Blood cultures 4/17 negative to date, mild leukocytosis which is slightly worse this am (see above).  ?-CTM monitor her for signs and symptoms of infection  ? ?Metabolic encephalopathy ?Encephalopathy resolving as well but appears to be waxing and waning consistent with hospital delirium.  ? ?Hyperkalemia ?5.5 this am. Will repeat in the afternoon and treat if still elevated.  ? ?Chronic obstructive pulmonary disease ?Tobacco use disorder ?Emphysema ?Chronic. Appears to be complicating her presentation.  ?-Duoneb nebs q 6hrs, will start brovana, and pulmicort today.  ?-Nicotine patch in place ? ?Hyperglycemia ?-CGB within range. Continue SSI ? ?Agitation/anxiety ?-Continue seroquel 25 mg morning and 50 mg at bedtime.  ?-Continue BuSpar 10 mg TID ?-Gabapentin 300 mg BID.  ?-Maintain pain medications ? ?Sacral Pressure Ulcer ?Stage 2 pressure ulcer noted. Protective foam pads in place and continue pressure offloading.  ?-CTM ? ?History of cavitary pulmonary nodule, left upper lobe ?-Interval decrease in size of nodule. No PFTs at baseline.  ?-Following up with Dr. Melvyn Novas in the outpatient setting, to follow-up as outpatient ? ? ?Best Practice (right click and "Reselect all SmartList Selections" daily)  ?Diet/type: full liquids ?DVT prophylaxis: prophylactic heparin  ?GI prophylaxis: N/A ?Lines: N/A ?Foley:  Yes ?Continuous: None ? ?Code Status:  full code ?Last date of multidisciplinary goals of care discussion. Will update family today.  ? ?Past Medical History:  ?She,  has a past medical history of Allergy, Anemia, GERD (gastroesophageal reflux disease),  HLA B27 (HLA B27 positive), UTI (urinary tract infection), Hyperplastic colon polyp, Hypertension, IBS (irritable bowel syndrome), Multiple sclerosis ([place]), and Seizures ([place]).  ?Surgical History:  ? ?Past Surgical History:  ?Procedure Laterality Date  ? APPENDECTOMY    ? BLADDER SURGERY  2007  ? CESAREAN SECTION    ? x 2   ? ORIF TOE FRACTURE Left 06/27/2013  ? Procedure: OPEN REDUCTION INTERNAL FIXATION (ORIF) LEFT FIFTH METATARSAL;  Surgeon: Wylene Simmer, MD;  Location: [place];  Service: Orthopedics;  Laterality: Left;  ? PARTIAL HYSTERECTOMY  2006  ? TONSILLECTOMY    ? TUBAL LIGATION    ?  ?Social History:  ? reports that she has been smoking cigarettes. She has a 30.00 pack-year smoking history. She has never used smokeless tobacco. She reports current alcohol use of about 1.0 standard drink per week. She reports that she does not use drugs.  ?Family History:  ?Her family history includes Breast cancer in her maternal grandmother; Diabetes in her maternal aunt; Heart disease in her father; Irritable bowel syndrome in her brother and sister. There is no history of Colon cancer.  ?Allergies ?Allergies  ?Allergen Reactions  ? Lamictal [Lamotrigine] Rash  ? Other   ?  Cats: Rash  ?Shrimp scampi sauce: rash  ? Tramadol   ?  Lowers seizure threshold  ?  ?The patient is critically ill with multiple organ systems failure and requires high complexity decision making for assessment and support, frequent evaluation and titration of therapies, application of advanced monitoring technologies  and extensive interpretation of multiple databases. Critical Care Time devoted to patient care services described in this note independent of APP/resident time (if applicable)  is 31 minutes.  ? ?Idamae Schuller, MD ?[facility]. [facility] ?Internal Medicine Residency, PGY-1  ?[phone] ?

## 2021-05-07 NOTE — Progress Notes (Signed)
Pt transported to CT2 and back to 2M05 without incidence.  ?

## 2021-05-07 NOTE — Consult Note (Addendum)
[address]       [place] [address]             [phone]        [name] [facility] Medical Record #[id number] Date of Birth: [birth year]  Referring: CCM Primary Care: [facility] Primary Cardiologist:None  Chief Complaint:     Iatrogenic Recurrent Pneumothorax  History of Present Illness:      [name] is a 61 yo female with known history of Advanced Multiple Sclerosis, Pulmonary nodule and seizure disorder.  The patient presented via EMS to the ED on 4/17 with complaints of increased weakness over the past several days.  The patient previously was able to ambulate with the assistance of a walker, however she was no longer able to do this.  She also had bilateral lower extremity wounds that had been present for months.  She was also mildly confused on presentation.  Workup in the ED showed the patient to be in acute renal failure with a creatinine > 7.  She had leukocytosis and evidence of pneumonia of the left upper lobe.  Blood cultures were obtained and she was initiated on ABX per suspected sepsis.  She was hyperkalemic and treated appropriately.  Urinalysis workup showed no evidence of infection, but did show dehydration and she was treated with fluids.  She has been receiving Ocrevus infusions since February.  Her husband state she has been having pain in the neck, back, and legs which she was receiving Ibuprofen.  She was admitted for further workup.  Critical care consult was obtained to help manage septic shock.  She required support with Levophed and vasopressin.  Nephrology was consulted for renal issues and fortunately dialysis was able to be avoided.  She developed Iatrogenic pneumothorax on 4/17 and CCM placed a pigtail catheter.  Her respiratory status remained tenuous.  She required transition to BIPAP.  Unfortunately despite pigtail catheter being in place her pneumothorax increased.  She underwent placement of a second  pigtail catheter on 04/28/2021, with subsequent removal of previously placed chest tube.  The remaining chest tube remained on suction with resolution of pneumothorax.  They attempted to wean patient off suction.  Repeat CXR on 4/24 showed re-development of pneumothorax, resulting in chest tube being placed on suction.  She developed stroke symptoms and code stroke was initiated.  Neurology consult was obtained and they did not feel symptoms were related to stroke.  The patient's repeat CXR showed persistent pneumothorax.  She required placement of another pigtail catheter on 05/04/2021.  Follow up CXR showed a small 1 cm apical pneumothorax with persistent air leak.  On 4/27 CCM was called to bedside due to respiratory issues.  CXR showed increase pneumothorax.  A large bore chest tube was placed without no air leak noted.  Patient again developed worsening respiratory status despite large chest tube in place.  CXR showed a large right pneumothorax.  CT scan of chest obtained and showed right anterior pigtail catheter in good position.  Large bore chest tube possibly with tip occulusion.  IR was consulted and did not feel additional CT would be needed at this time.  Cardiothoracic surgical consultation requested.  Currently the patient is doing better.  She continues to have some shortness of breath which comes and goes.    Current Activity/ Functional Status: Patient was not independent with mobility/ambulation, transfers, ADL's, IADL's.   Zubrod Score: At the time of surgery this patient's most appropriate  activity status/level should be described as: []     0    Normal activity, no symptoms []     1    Restricted in physical strenuous activity but ambulatory, able to do out light work []     2    Ambulatory and capable of self care, unable to do work activities, up and about                 more than 50%  Of the time                            []     3    Only limited self care, in bed greater than 50% of  waking hours []     4    Completely disabled, no self care, confined to bed or chair []     5    Moribund  Past Medical History:  Diagnosis Date   Allergy    seasonal   Anemia    GERD (gastroesophageal reflux disease)    HLA B27 (HLA B27 positive)    Hx: UTI (urinary tract infection)    Hyperplastic colon polyp    Hypertension    IBS (irritable bowel syndrome)    Multiple sclerosis (HCC)    Seizures (HCC)    hx epilepsy-last seizure 9/14 after taking tramadol    Past Surgical History:  Procedure Laterality Date   APPENDECTOMY     BLADDER SURGERY  2007   CESAREAN SECTION     x 2    ORIF TOE FRACTURE Left 06/27/2013   Procedure: OPEN REDUCTION INTERNAL FIXATION (ORIF) LEFT FIFTH METATARSAL;  Surgeon: Toni Arthurs, MD;  Location:  SUR[facility]ERY CENTER;  Service: Orthopedics;  Laterality: Left;   PARTIAL HYSTERECTOMY  2006   TONSILLECTOMY     TUBAL LIGATION      Social History   Tobacco Use  Smoking Status Every Day   Packs/day: 1.00   Years: 30.00   Pack years: 30.00   Types: Cigarettes  Smokeless Tobacco Never  Tobacco Comments   1 ppd    Social History   Substance and Sexual Activity  Alcohol Use Yes   Alcohol/week: 1.0 standard drink   Types: 1 Glasses of wine per week   Comment: OCC.     Allergies  Allergen Reactions   Lamictal [Lamotrigine] Rash   Other     Cats: Rash  Shrimp scampi sauce: rash   Tramadol     Lowers seizure threshold    Current Facility-Administered Medications  Medication Dose Route Frequency Provider Last Rate Last Admin   0.9 %  sodium chloride infusion   Intravenous PRN Olalere, Adewale A, MD   Stopped at 05/05/21 1600   0.9 %  sodium chloride infusion  250 mL Intravenous Continuous Migdalia Dk, MD   Held at 05/06/21 0000   albuterol (PROVENTIL) (2.5 MG/3ML) 0.083% nebulizer solution 2.5 mg  2.5 mg Nebulization Q4H PRN Olalere, Adewale A, MD   2.5 mg at 05/05/21 1629   Ampicillin-Sulbactam (UNASYN) 3 g in sodium  chloride 0.9 % 100 mL IVPB  3 g Intravenous Q8H Cyril Mourning V, MD 200 mL/hr at 05/07/21 0911 3 g at 05/07/21 0911   arformoterol (BROVANA) nebulizer solution 15 mcg  15 mcg Nebulization BID Oretha Milch, MD   15 mcg at 05/07/21 0846   budesonide (PULMICORT) nebulizer solution 0.5 mg  0.5 mg Nebulization BID Oretha Milch, MD  0.5 mg at 05/07/21 0847   busPIRone (BUSPAR) tablet 10 mg  10 mg Per Tube TID Olalere, Adewale A, MD   10 mg at 05/07/21 1007   chlorhexidine (PERIDEX) 0.12 % solution 15 mL  15 mL Mouth Rinse BID Max Fickle B, MD   15 mL at 05/07/21 1007   Chlorhexidine Gluconate Cloth 2 % PADS 6 each  6 each Topical Daily Lorin Glass, MD   6 each at 05/07/21 1008   feeding supplement (OSMOLITE 1.2 CAL) liquid 1,000 mL  1,000 mL Per Tube Continuous Max Fickle B, MD 60 mL/hr at 05/06/21 1604 1,000 mL at 05/06/21 1604   feeding supplement (PROSource TF) liquid 45 mL  45 mL Per Tube BID Max Fickle B, MD   45 mL at 05/07/21 1007   gabapentin (NEURONTIN) 250 MG/5ML solution 300 mg  300 mg Per Tube Q12H Max Fickle B, MD   300 mg at 05/07/21 1010   glycopyrrolate (ROBINUL) tablet 1 mg  1 mg Per Tube BID Max Fickle B, MD   1 mg at 05/07/21 1007   guaiFENesin-codeine 100-10 MG/5ML solution 5 mL  5 mL Per Tube Q6H Millen, Jessica B, RPH   5 mL at 05/07/21 1007   heparin injection 5,000 Units  5,000 Units Subcutaneous Q8H Lorin Glass, MD   5,000 Units at 05/07/21 0512   insulin aspart (novoLOG) injection 0-15 Units  0-15 Units Subcutaneous Q4H Gwenevere Abbot, MD   2 Units at 05/07/21 0336   ipratropium-albuterol (DUONEB) 0.5-2.5 (3) MG/3ML nebulizer solution 3 mL  3 mL Nebulization BID Olalere, Adewale A, MD   3 mL at 05/07/21 0735   levETIRAcetam (KEPPRA) 100 MG/ML solution 500 mg  500 mg Per Tube BID Max Fickle B, MD   500 mg at 05/07/21 1007   MEDLINE mouth rinse  15 mL Mouth Rinse q12n4p Max Fickle B, MD   15 mL at 05/06/21 1700   melatonin tablet 5  mg  5 mg Per Tube QHS Olalere, Adewale A, MD   5 mg at 05/06/21 2142   menthol-cetylpyridinium (CEPACOL) lozenge 3 mg  1 lozenge Oral Q6H PRN Gwenevere Abbot, MD       midodrine (PROAMATINE) tablet 5 mg  5 mg Per Tube Q8H Olalere, Adewale A, MD   5 mg at 05/07/21 0912   multivitamin with minerals tablet 1 tablet  1 tablet Per Tube Daily Max Fickle B, MD   1 tablet at 05/07/21 1007   naloxone (NARCAN) injection 0.4 mg  0.4 mg Intravenous PRN Olalere, Adewale A, MD   0.4 mg at 05/04/21 1956   ondansetron (ZOFRAN) injection 4 mg  4 mg Intravenous Q6H PRN Lorin Glass, MD   4 mg at 05/06/21 2042   oxyCODONE (Oxy IR/ROXICODONE) immediate release tablet 5 mg  5 mg Per Tube Q6H PRN Olalere, Adewale A, MD   5 mg at 05/07/21 0125   phenylephrine (NEO-SYNEPHRINE) 20mg /NS premix infusion  0-150 mcg/min Intravenous Titrated Ogan, Okoronkwo U, MD 26.3 mL/hr at 05/07/21 0900 35 mcg/min at 05/07/21 0900   QUEtiapine (SEROQUEL) tablet 25 mg  25 mg Oral q morning Olalere, Adewale A, MD   25 mg at 05/07/21 1007   QUEtiapine (SEROQUEL) tablet 50 mg  50 mg Per Tube QHS Olalere, Adewale A, MD   50 mg at 05/06/21 2143   sodium chloride flush (NS) 0.9 % injection 10 mL  10 mL Other Q8H Simonne Martinet, NP   10 mL at  05/07/21 1008   sodium chloride flush (NS) 0.9 % injection 10-40 mL  10-40 mL Intracatheter Q12H Lorin Glass, MD   10 mL at 05/06/21 2146   sodium chloride flush (NS) 0.9 % injection 10-40 mL  10-40 mL Intracatheter PRN Lorin Glass, MD   3 mL at 05/06/21 1610   white petrolatum (VASELINE) gel   Topical PRN Olalere, Adewale A, MD        Medications Prior to Admission  Medication Sig Dispense Refill Last Dose   Cholecalciferol (VITAMIN D3) 25 MCG (1000 UT) CAPS Take 1,000 Units by mouth daily.   04/25/2021   gabapentin (NEURONTIN) 300 MG capsule Take 300 mg by mouth 2 (two) times daily.   04/25/2021   glycopyrrolate (ROBINUL) 1 MG tablet Take 1 mg by mouth 2 (two) times daily.   04/25/2021    hydrOXYzine (ATARAX) 25 MG tablet Take 25 mg by mouth 2 (two) times daily as needed for anxiety.   04/25/2021   ibuprofen (ADVIL,MOTRIN) 200 MG tablet Take 600 mg by mouth every 6 (six) hours as needed for moderate pain.   04/25/2021   levETIRAcetam (KEPPRA) 500 MG tablet Take 1 tablet (500 mg total) by mouth 2 (two) times daily. 60 tablet 12 04/26/2021   losartan (COZAAR) 25 MG tablet Take 25 mg by mouth every evening.    04/26/2021   Turmeric (QC TUMERIC COMPLEX PO) Take 475 mg by mouth daily.   04/25/2021    Family History  Problem Relation Age of Onset   Heart disease Father    Breast cancer Maternal Grandmother    Diabetes Maternal Aunt    Colon cancer Neg Hx    Irritable bowel syndrome Sister    Irritable bowel syndrome Brother      Review of Systems:   ROS Pertinent items are noted in HPI.     Cardiac Review of Systems: Y or  [    ]= no  Chest Pain [    ]  Resting SOB [   ] Exertional SOB  [  ]  Orthopnea [  ]   Pedal Edema [   ]    Palpitations [  ] Syncope  [  ]   Presyncope [   ]  General Review of Systems: [Y] = yes [  ]=no Constitional: recent weight change [  ]; anorexia [  ]; fatigue [  ]; nausea [  ]; night sweats [  ]; fever [  ]; or chills [  ]                                                               Dental: Last Dentist visit:   Eye : blurred vision [  ]; diplopia [   ]; vision changes [  ];  Amaurosis fugax[  ]; Resp: cough [  ];  wheezing[  ];  hemoptysis[  ]; shortness of breath[  ]; paroxysmal nocturnal dyspnea[  ]; dyspnea on exertion[  ]; or orthopnea[  ];  GI:  gallstones[  ], vomiting[  ];  dysphagia[  ]; melena[  ];  hematochezia [  ]; heartburn[  ];   Hx of  Colonoscopy[  ]; GU: kidney stones [  ]; hematuria[  ];   dysuria [  ];  nocturia[  ];  history of     obstruction [  ]; urinary frequency [  ]             Skin: rash, swelling[  ];, hair loss[  ];  peripheral edema[  ];  or itching[  ]; Musculosketetal: myalgias[  ];  joint swelling[  ];  joint  erythema[  ];  joint pain[  ];  back pain[  ];  Heme/Lymph: bruising[  ];  bleeding[  ];  anemia[  ];  Neuro: TIA[  ];  headaches[  ];  stroke[  ];  vertigo[  ];  seizures[  ];   paresthesias[  ];  difficulty walking[  ];  Psych:depression[  ]; anxiety[  ];  Endocrine: diabetes[  ];  thyroid dysfunction[  ];              Physical Exam: BP (!) 106/56   Pulse (!) 103   Temp 99.6 F (37.6 C) (Axillary)   Resp 18   Ht 5\' 4"  (1.626 m)   Wt 62.1 kg   SpO2 100%   BMI 23.50 kg/m   General appearance: alert, cooperative, and no distress Head: Normocephalic, without obvious abnormality, atraumatic Resp: rhonchi bilaterally Cardio: regular rate and rhythm, S1, S2 normal, no murmur, click, rub or gallop Neurologic: Grossly normal  Diagnostic Studies & Laboratory data:     Recent Radiology Findings:   DG Chest 1 View  Result Date: 05/06/2021 CLINICAL DATA:  Right pneumothorax EXAM: CHEST  1 VIEW COMPARISON:  Previous studies including the examination done earlier today FINDINGS: There is interval placement of a larger caliber third right chest tube. There is almost complete resolution of left pneumothorax. There is possible minimal left apical pneumothorax. There is increase in amount of subcutaneous emphysema in the right chest wall. Increased density in the left lower lung fields obscuring left hemidiaphragm may suggest pleural effusion and atelectasis/pneumonia. Enteric tube is noted traversing the esophagus. IMPRESSION: There is almost complete clearing of right pneumothorax after placement of third right chest tube.No significant interval changes are noted in the lung fields. Electronically Signed   By: Ernie Avena M.D.   On: 05/06/2021 12:15   CT CHEST WO CONTRAST  Addendum Date: 05/07/2021   ADDENDUM REPORT: 05/07/2021 03:18 ADDENDUM: Given worsening of imaging findings over time, repositioning of large-bore chest tube is recommended. There is also communication of cutaneous  emphysema and pneumothorax in the right lateral chest wall chest wall adjacent to the insertion site of the large-bore chest tube between the T5 and T6 intercostal space. Findings were relayed to PA Desai at 3:09 a.m. Electronically Signed   By: Thornell Sartorius M.D.   On: 05/07/2021 03:18   Addendum Date: 05/07/2021   ADDENDUM REPORT: 05/07/2021 02:59 ADDENDUM: Critical findings were reported to PA Rahul Desai at 12:41 a.m. No obvious evidence of bronchopleural fistula is seen. Findings may be related to recent large-bore chest tube placement. Repositioning is recommended. Electronically Signed   By: Thornell Sartorius M.D.   On: 05/07/2021 02:59   Result Date: 05/07/2021 CLINICAL DATA:  Pneumothorax. EXAM: CT CHEST WITHOUT CONTRAST TECHNIQUE: Multidetector CT imaging of the chest was performed following the standard protocol without IV contrast. RADIATION DOSE REDUCTION: This exam was performed according to the departmental dose-optimization program which includes automated exposure control, adjustment of the mA and/or kV according to patient size and/or use of iterative reconstruction technique. COMPARISON:  04/26/2021, 05/07/2021. FINDINGS: Cardiovascular: The heart is normal in size and there is  a small pericardial effusion. Multi-vessel coronary artery calcifications are noted. There is atherosclerotic calcification of the aorta without evidence of aneurysm. The pulmonary trunk is normal in caliber. Mediastinum/Nodes: A rim calcified nodule is present in the right lobe of the thyroid gland. The trachea is within normal limits. An enteric tube is noted in the esophagus. No mediastinal or axillary lymphadenopathy. Evaluation of the hilar regions is limited due to lack of IV contrast. There is extensive pneumomediastinum. Lungs/Pleura: There is a large right pneumothorax with chest tubes in place. There is a chest tube entering the anterior upper chest which appears appropriate in position. There is a large bore  chest tube entering the right lateral chest wall with occlusion of the tip which terminates in the right lower lobe. The distal tip of the patchy consolidation is noted in the right upper lobe and there is right middle lobe collapse. Atelectasis is noted in the right lower lobe. Emphysematous changes are present in the lungs bilaterally. There is a cavitary lesion in the left upper lobe measuring 2.5 cm, axial image 71. Scattered atelectasis or infiltrate is noted in the left lung. There are small bilateral pleural effusions. Upper Abdomen: An enteric tube is seen in the stomach. Bilateral renal cysts are noted. Musculoskeletal: There is extensive subcutaneous emphysema in the anterior and posterior chest walls bilaterally and lateral chest wall on the right. Degenerative changes are present in the midthoracic spine. No acute osseous abnormality. IMPRESSION: 1. Large right pneumothorax with 2 chest tubes in place. The anterior pigtail catheter appears appropriate in position. There is a lateral large-bore chest tube on the right which terminates in the right lower lobe in the tip of the chest tube appears occluded. Repositioning is suggested. 2. Right upper lobe consolidation and right middle lobe collapse with patchy atelectasis or infiltrate bilaterally. 3. Small bilateral pleural effusions. 4. Cavitary lesion in the left upper lobe, not significantly changed from the prior exam. 5. Extensive pneumomediastinum and subcutaneous emphysema. 6. Emphysema. 7. Aortic atherosclerosis and coronary artery calcifications. Electronically Signed: By: Thornell Sartorius M.D. On: 05/07/2021 02:27   DG CHEST PORT 1 VIEW  Result Date: 05/07/2021 CLINICAL DATA:  61 year old female with history of pneumothorax. EXAM: PORTABLE CHEST 1 VIEW COMPARISON:  May 07, 2021. FINDINGS: Since the previous imaging study the large-bore chest tube has been retracted approximately 1.5-2 cm. The pigtail catheter appears to have been advanced, tip  projects over the upper mediastinum likely in the anterior lung. Feeding tube in place. EKG leads project over the chest. Extensive RIGHT sided subcutaneous emphysema has improved slightly since previous imaging. Cardiomediastinal contours and hilar structures are stable. No visible RIGHT pneumothorax. LEFT chest with continued airspace disease at the LEFT lung base obscuring LEFT hemidiaphragm. On limited assessment there is no acute skeletal process. IMPRESSION: 1. Interval slight retraction of the large-bore RIGHT-sided chest tube. 2. The RIGHT pigtail catheter appears to have been advanced, tip projects over the upper mediastinum likely in the anterior lung this could also perhaps be due to further re-expansion of the RIGHT lung since previous imaging. Could consider slight retraction based on position as warranted. 3. Continued but reduced subcutaneous emphysema. 4. No visible RIGHT pneumothorax. 5. Continued airspace disease at the LEFT lung base. Electronically Signed   By: Donzetta Kohut M.D.   On: 05/07/2021 09:08   DG CHEST PORT 1 VIEW  Result Date: 05/07/2021 CLINICAL DATA:  Follow-up pneumothorax EXAM: PORTABLE CHEST 1 VIEW COMPARISON:  05/06/2021, 05/05/2021, 05/04/2021 FINDINGS: Esophageal tube tip  is below the diaphragm but incompletely visualized. 2 right-sided chest tubes remain in place. Increasing subcutaneous emphysema in the right neck and chest wall. Persistent moderate to large right pneumothorax. Increasing airspace disease at the right mid lung and right base. Dense consolidation left lung base. Diffuse reticular and ground-glass opacity throughout the left thorax. Stable cardiac size. IMPRESSION: 1. Similar positioning of the right-sided chest tube side with grossly stable size of moderate to large right pneumothorax 2. Increasing subcutaneous emphysema in the right chest wall and neck 3. Increasing airspace disease in the right mid lung and right base. No change in dense left lung base  consolidation with diffuse interstitial and ground-glass disease Electronically Signed   By: Jasmine Pang M.D.   On: 05/07/2021 01:03   DG Chest Port 1 View  Result Date: 05/06/2021 CLINICAL DATA:  Pneumothorax EXAM: PORTABLE CHEST 1 VIEW COMPARISON:  05/06/2021 FINDINGS: Right chest tube remain in place with continued moderate to large right pneumothorax, unchanged. Subcutaneous emphysema throughout the right chest wall is also unchanged. Left lower lobe airspace opacity continues, stable. Heart is normal size. IMPRESSION: Stable moderate to large right pneumothorax with right chest tubes in place. Stable left lower lobe opacity. Electronically Signed   By: Charlett Nose M.D.   On: 05/06/2021 21:19   DG Chest Port 1 View  Result Date: 05/06/2021 CLINICAL DATA:  Shortness of breath EXAM: PORTABLE CHEST 1 VIEW COMPARISON:  05/06/2021 at 1919 hours FINDINGS: Moderate to large right pneumothorax, unchanged. Two indwelling right chest tubes. Extensive subcutaneous emphysema along the right lateral chest wall. Patchy left mid/lower lung opacity, unchanged. The heart is normal in size. Enteric tube coursing into the stomach. IMPRESSION: Moderate to large right pneumothorax, unchanged. Two indwelling right chest tubes. Electronically Signed   By: Charline Bills M.D.   On: 05/06/2021 20:30   DG Chest Port 1 View  Result Date: 05/06/2021 CLINICAL DATA:  Acute respiratory failure EXAM: PORTABLE CHEST 1 VIEW COMPARISON:  Chest x-ray dated April 16, 2021 FINDINGS: Cardiac and mediastinal contours are unchanged. Moderate to large right pneumothorax, increased in size when compared with prior exam. Interval removal of more laterally positioned right-sided chest tube. Two right-sided chest tubes remain in place. Bilateral interstitial opacities, unchanged when compared with prior exam. No large pleural effusion. IMPRESSION: Moderate to large right pneumothorax, increased in size when compared with prior exam.  Interval removal of more laterally positioned right-sided chest tube. Two right-sided chest tubes remain in place. Critical Value/emergent results were called by telephone at the time of interpretation on 05/06/2021 at 7:41 pm to provider Juanetta Snow , who verbally acknowledged these results. Electronically Signed   By: Allegra Lai M.D.   On: 05/06/2021 19:42   DG CHEST PORT 1 VIEW  Result Date: 05/06/2021 CLINICAL DATA:  Pneumothorax. EXAM: PORTABLE CHEST 1 VIEW COMPARISON:  Same day. FINDINGS: Stable position of 2 right-sided chest tubes. Mild right apical pneumothorax is noted which has enlarged slightly compared to prior exam. Feeding tube is seen entering stomach. Stable left basilar atelectasis or infiltrate is noted with associated pleural effusion. IMPRESSION: Stable position of 2 right-sided chest tubes with mildly increased right apical pneumothorax. Stable left basilar atelectasis or infiltrate is noted with associated pleural effusion. Electronically Signed   By: Lupita Raider M.D.   On: 05/06/2021 11:09   DG CHEST PORT 1 VIEW  Result Date: 05/06/2021 CLINICAL DATA:  61 year old female with history of weakness. Oxygen dependent patient. EXAM: PORTABLE CHEST 1 VIEW COMPARISON:  Multiple priors, most recently chest x-ray 05/05/2021. FINDINGS: A feeding tube is seen extending into the abdomen, however, the tip of the feeding tube extends below the lower margin of the image. Two right-sided small bore chest tubes are noted, with tips reformed in the lateral aspect of the mid right hemithorax and medial aspect of the upper right hemithorax. Lung volumes are low. Diffuse interstitial prominence throughout the lungs bilaterally (left-greater-than-right). Additional left basilar opacity which may reflect atelectasis and/or consolidation, with small left pleural effusion. No definite right pleural effusion. No appreciable pneumothorax. Pulmonary vasculature does not appearing origin. Heart size  is normal. Upper mediastinal contours are within normal limits. Atherosclerotic calcifications in the thoracic aorta. Subcutaneous emphysema in the right chest wall. IMPRESSION: 1. Support apparatus, as above. 2. The appearance the chest is most suggestive of severe bronchitis and bilateral multilobar bronchopneumonia (left-greater-than-right). 3. Small left pleural effusion with atelectasis and/or consolidation in the left lung base. 4. Known thick-walled aggressive appearing left upper lobe cavitary nodule better demonstrated on prior chest CT. Electronically Signed   By: Trudie Reed M.D.   On: 05/06/2021 08:14   DG CHEST PORT 1 VIEW  Result Date: 05/05/2021 CLINICAL DATA:  Follow-up pneumothorax. EXAM: PORTABLE CHEST 1 VIEW COMPARISON:  05/05/2021, earlier today FINDINGS: Two right-sided chest tubes are unchanged in position from previous exam. Small right apical pneumothorax is new when compared with the previous study. This measures 1.1 cm over the apex. Similar appearance of right chest wall soft tissue emphysema which extends into the right lower neck. Feeding tube tip courses below the level of the hemidiaphragms. Stable cardiomediastinal contours. Left lung base opacity is unchanged and may represent airspace disease or atelectasis. Mild diffuse increase interstitial markings are again noted bilaterally and appear unchanged. IMPRESSION: 1. Small recurrent right apical pneumothorax measures approximately 1.1 cm in maximum thickness. Unchanged position of right-sided chest tubes. 2. No change in aeration to the left lung base. 3. Stable support apparatus. 4. Stable appearance of right chest wall soft tissue emphysema. These results will be called to the ordering clinician or representative by the Radiologist Assistant, and communication documented in the PACS or [employer]. Electronically Signed   By: Signa Kell M.D.   On: 05/05/2021 17:28     I have independently reviewed the above  radiologic studies and discussed with the patient   Recent Lab Findings: Lab Results  Component Value Date   WBC 16.4 (H) 05/07/2021   HGB 10.1 (L) 05/07/2021   HCT 32.5 (L) 05/07/2021   PLT 317 05/07/2021   GLUCOSE 106 (H) 05/07/2021   ALT 21 04/28/2021   AST 39 04/28/2021   NA 139 05/07/2021   K 5.5 (H) 05/07/2021   CL 102 05/07/2021   CREATININE 0.60 05/07/2021   BUN 28 (H) 05/07/2021   CO2 28 05/07/2021   INR 1.3 (H) 04/26/2021   HGBA1C 5.0 04/26/2021    Assessment / Plan:      Iatrogenic Pneumothorax- patient has had 4 chest tubes place.. currently the anterior pigtail is in place and large bore tube placed was occluded with tip in the lung... the tube was adjusted this morning with follow up CXR showing no evidence of pneumothorax... there is a small air leak and we recommend leaving chest tube to suction, do not attempt to wean at this time Advanced MS Septic Shock- improving, off all drips, on Unasyn, CCM followin Dispo- patient is not a surgical candidate at this time, CTs in place are functioning with  small air leak.. CXR w/o pneumothorax after repositioning- of large bore chest tube.... leave on suction and do not attempt to wean at this time... we will follow along with CCM  Lowella Dandy, PA-C 05/07/2021 10:18 AM    Agree with above. Persistent pneumothorax.  On review of the CT scan there appears to be good apical control.  Would keep chest tube to suction for now and management per PCCM.  No plans for surgical intervention at this time.  [name]

## 2021-05-08 ENCOUNTER — Inpatient Hospital Stay (HOSPITAL_COMMUNITY): Payer: Medicare Other

## 2021-05-08 DIAGNOSIS — G9341 Metabolic encephalopathy: Secondary | ICD-10-CM | POA: Diagnosis not present

## 2021-05-08 DIAGNOSIS — J939 Pneumothorax, unspecified: Secondary | ICD-10-CM

## 2021-05-08 DIAGNOSIS — J9601 Acute respiratory failure with hypoxia: Secondary | ICD-10-CM | POA: Diagnosis not present

## 2021-05-08 DIAGNOSIS — L8990 Pressure ulcer of unspecified site, unspecified stage: Secondary | ICD-10-CM | POA: Insufficient documentation

## 2021-05-08 LAB — MAGNESIUM: Magnesium: 1.7 mg/dL (ref 1.7–2.4)

## 2021-05-08 LAB — BASIC METABOLIC PANEL
Anion gap: 7 (ref 5–15)
BUN: 20 mg/dL (ref 8–23)
CO2: 28 mmol/L (ref 22–32)
Calcium: 8.5 mg/dL — ABNORMAL LOW (ref 8.9–10.3)
Chloride: 105 mmol/L (ref 98–111)
Creatinine, Ser: 0.5 mg/dL (ref 0.44–1.00)
GFR, Estimated: >60 mL/min (ref >60)
Glucose, Bld: 133 mg/dL — ABNORMAL HIGH (ref 70–99)
Potassium: 4.7 mmol/L (ref 3.5–5.1)
Sodium: 140 mmol/L (ref 135–145)

## 2021-05-08 LAB — CBC WITH DIFFERENTIAL/PLATELET
Abs Immature Granulocytes: 0.15 10*3/uL — ABNORMAL HIGH (ref 0.00–0.07)
Basophils Absolute: 0 10*3/uL (ref 0.0–0.1)
Basophils Relative: 0 %
Eosinophils Absolute: 0.3 10*3/uL (ref 0.0–0.5)
Eosinophils Relative: 2 %
HCT: 27.7 % — ABNORMAL LOW (ref 36.0–46.0)
Hemoglobin: 9 g/dL — ABNORMAL LOW (ref 12.0–15.0)
Immature Granulocytes: 1 %
Lymphocytes Relative: 3 %
Lymphs Abs: 0.4 10*3/uL — ABNORMAL LOW (ref 0.7–4.0)
MCH: 29.4 pg (ref 26.0–34.0)
MCHC: 32.5 g/dL (ref 30.0–36.0)
MCV: 90.5 fL (ref 80.0–100.0)
Monocytes Absolute: 0.7 10*3/uL (ref 0.1–1.0)
Monocytes Relative: 5 %
Neutro Abs: 13.2 10*3/uL — ABNORMAL HIGH (ref 1.7–7.7)
Neutrophils Relative %: 89 %
Platelets: 305 10*3/uL (ref 150–400)
RBC: 3.06 MIL/uL — ABNORMAL LOW (ref 3.87–5.11)
RDW: 15.5 % (ref 11.5–15.5)
WBC: 14.8 10*3/uL — ABNORMAL HIGH (ref 4.0–10.5)
nRBC: 0 % (ref 0.0–0.2)

## 2021-05-08 LAB — GLUCOSE, CAPILLARY
Glucose-Capillary: 150 mg/dL — ABNORMAL HIGH (ref 70–99)
Glucose-Capillary: 155 mg/dL — ABNORMAL HIGH (ref 70–99)
Glucose-Capillary: 130 mg/dL — ABNORMAL HIGH (ref 70–99)
Glucose-Capillary: 113 mg/dL — ABNORMAL HIGH (ref 70–99)
Glucose-Capillary: 115 mg/dL — ABNORMAL HIGH (ref 70–99)
Glucose-Capillary: 164 mg/dL — ABNORMAL HIGH (ref 70–99)

## 2021-05-08 LAB — PROCALCITONIN: Procalcitonin: 0.21 ng/mL

## 2021-05-08 LAB — PHOSPHORUS: Phosphorus: 3.6 mg/dL (ref 2.5–4.6)

## 2021-05-08 MED ORDER — LIDOCAINE HCL (PF) 1 % IJ SOLN
INTRAMUSCULAR | Status: AC
  Administered 2021-05-08: 5 mL
  Filled 2021-05-08: qty 5

## 2021-05-08 MED ORDER — LIDOCAINE HCL (PF) 1 % IJ SOLN
INTRAMUSCULAR | Status: AC
Start: 2021-05-08 — End: 2021-05-08
  Administered 2021-05-08: 5 mL
  Filled 2021-05-08: qty 5

## 2021-05-08 MED ORDER — LACTATED RINGERS IV BOLUS
1000.0000 mL | Freq: Once | INTRAVENOUS | Status: AC
  Administered 2021-05-08: 1000 mL via INTRAVENOUS

## 2021-05-08 MED ORDER — FENTANYL CITRATE (PF) 100 MCG/2ML IJ SOLN
50.0000 ug | Freq: Once | INTRAMUSCULAR | Status: AC
  Administered 2021-05-08: 50 ug via INTRAVENOUS
  Filled 2021-05-08: qty 2

## 2021-05-08 NOTE — Procedures (Signed)
Insertion of Chest Tube Procedure Note ? ?[name]  ?[id number]  ?December 04, 1960 ? ?Date:05/08/21  ?Time:11:48 AM  ? ? ?Provider Performing: Comer Locket. Vashon Arch  ? ?Procedure: Chest Tube Insertion (62952) ? ?Indication(s) ?Pneumothorax ? ?Consent ?Risks of the procedure as well as the alternatives and risks of each were explained to the patient and/or caregiver.  Consent for the procedure was obtained and is signed in the bedside chart ? ?Anesthesia ?Topical only with 1% lidocaine  ? ? ?Time Out ?Verified patient identification, verified procedure, site/side was marked, verified correct patient position, special equipment/implants available, medications/allergies/relevant history reviewed, required imaging and test results available. ? ? ?Sterile Technique ?Maximal sterile technique including full sterile barrier drape, hand hygiene, sterile gown, sterile gloves, mask, hair covering, sterile ultrasound probe cover (if used). ? ? ?Procedure Description ?Ultrasound not used to identify appropriate pleural anatomy for placement and overlying skin marked. Area of placement cleaned and draped in sterile fashion.  A 28 French chest tube was placed into the right pleural space using trochar. Appropriate return of air was obtained.  The tube was connected to atrium and placed on -20 cm H2O wall suction. ? ? ?Complications/Tolerance ?None; patient tolerated the procedure well. ?Chest X-ray is ordered to verify placement. ? ? ?EBL ?Minimal ? ?Specimen(s) ?none ? ?Comer Locket Vassie Loll MD ?

## 2021-05-08 NOTE — Progress Notes (Signed)
? ?NAME:  [name], MRN:  [id number], DOB:  14-Jul-1960, LOS: 82 ?ADMISSION DATE:  04/26/2021, CONSULTATION DATE:  04/17 ?REFERRING MD:  EDP, CHIEF COMPLAINT:  Septic shock  ? ?History of Present Illness:  ?This is a 43 yof w/ h/o MS (walks w/ walker at baseline). She started being treated w/ Ocrelizumab back in Feb. Per pt and family about 4 weeks after began to notice increased RLE swelling, scaling and weakness. She attributed this to an expected side effects of the medication. Then about 5-7d prior to presentation she developed increased weakness, worsening right leg pain, poor PO intake, and neck pain. These symptoms continued to worsen to point pain was no longer bearable and she was so weak that she was no longer able to get OOB.  ?  ?In ER she was  hypotensive in 70s, tachycardic, encephalopathic, wbc ct 17.2, CO2 9 w/ + anion gap metabolic acidosis, BUN 449, creatinine 7.81 and K 6.2 ?Pertinent  Medical History  ?MS walks w/ walker, GERD, HLA B27 positive, Pulm nodule, seizure, sacroiliitis, ankylosing spondylitis, fibromyalgia ? ?Significant Hospital Events: ?Including procedures, antibiotic start and stop dates in addition to other pertinent events   ?4/17 admitted w/ septic shock working dx cellulitis (RLE), Cultures sent, MRI ordered C-spine, CT lower ext ordered, Zosyn and Vanc started  ?04/19-BiPAP placed. Another chest tube placed for persistent pneumothorax.  ?04/20-Lateral chest tube removed.  ?4/23-still with air leak in chest tube ?Mental status improved. Patient on HFNC.  ?04/25- Enlarging pneumothorax requiring another chest tube to be placed.  ?04/26-Abx course completed.  ?04/27-Large bore chest tube placed.  ?04/28-chest tube adjusted with reexpansion,CT surgery consulted.  ? ?Interim History / Subjective:  ? ?Critically ill, remains on high flow nasal cannula, 80% / 20 L. ?Low-grade febrile ?Patient denies chest pain, dyspnea at baseline. ?Off Neo-Synephrine drip ? ?Objective   ?Blood  pressure 133/73, pulse (!) 106, temperature 98 ?F (36.7 ?C), temperature source Oral, resp. rate (!) 25, height 5' 4"  (1.626 m), weight 62.5 kg, SpO2 91 %. ?   ?FiO2 (%):  [60 %-80 %] 80 %  ? ?Intake/Output Summary (Last 24 hours) at 05/08/2021 0908 ?Last data filed at 05/08/2021 0800 ?Gross per 24 hour  ?Intake 4280.99 ml  ?Output 2481 ml  ?Net 1799.99 ml  ? ?1.6 L urine output and 350 cc stool occurrence. ?Filed Weights  ? 05/06/21 0500 05/07/21 0500 05/08/21 0500  ?Weight: 64 kg 62.1 kg 62.5 kg  ? ?Examination: ?General: Chronically ill-appearing, frail woman, lying in bed ?HENT: NCAT, on HFNC. ?Lungs: Decreased breath sounds on right, crackles left base, 2 chest tubes, both without air leak crepitus on right upper chest wall ?Cardiovascular: tachycardic, sinus rhythm. 2+ radial pulses ?Abdomen: normal bowel sounds, no TTP ?Extremities: bilateral extremities wrapped. No asymmetery ?Neuro: alert and oriented x4 ?Skin: Stage 2 sacral pressure ulcer  ?GU: catheter in place with light colored urine inside.  ? ?Labs show normal electrolytes, decreasing leukocytosis, stable anemia with slight drop from 10-9 ? ?Chest x-ray dependently reviewed shows large right apical pneumothorax, both chest tubes in position large bore tube within the lung parenchyma ?Consults  ?Nephrology ?Resolved Hospital Problem list   ?Hyperkalemia s/p IVF and insulin ?Acute metabolic encephalopathy after renal fxn improvement ?AKI ?Leukocytosis ?Hypernatremia ?Assessment & Plan:  ?Acute hypoxemic respiratory failure  ?Iatrogenic pneumothorax ?Sepsis 2/2 to Pneumonia ?Leukocytosis ?-Multifactorial, has chronic lung disease, along with deconditioning. Her pneumothorax intermittent worsening playing a role as well. She is s/p 2 chest tubes. Patient  responded to diuresis yesterday with good UOP and improvement in breathing. Concern was present for pneumonia and she has completed abx. Given new leukocytosis, will start Unasyn. Patient was off pressors  but phenylephrine gtt started 04/26 with intermittent requirement.  ?-Continue to wean oxygen supplementation as tolerated, still requiring high flow oxygen ?-Unfortunately right pneumothorax has recurred and right large bore chest tube allowed to be repositioned. ?Ensure pigtail is being flushed ?-Discussed with CT surgery who will replace large bore right chest tube ? ?Septic shock ?Bilateral lower extremity cellulitis ?-S/p ocrelizumab for MS as outpatient therapy.  ?-Lower extremity cellulitis felt to be related to ocrelizumab ?-Was on Ceftriaxone and last day was 05/06/21. ?-Blood cultures 4/17 negative to date, mild leukocytosis which is slightly worse this am (see above).  ?-Low procalcitonin reassuring, off pressors now but continue Unasyn for pneumomediastinum ? ? ? ? ?Chronic obstructive pulmonary disease ?Tobacco use disorder ?Emphysema ?Left upper lobe cavitary lesion first noted 10/2020, noted to be decreasing -Dr. Melvyn Novas  ?-Duoneb nebs q 6hrs,continue  brovana, and pulmicort  ?-Nicotine patch in place ? ?Hyperglycemia ?-CGB within range. Continue SSI ? ?Acute metabolic encephalopathy/hypoactive delirium ?- waxing and waning consistent with hospital delirium.  ?-Continue seroquel 25 mg morning and 50 mg at bedtime.  ?-Continue BuSpar 10 mg TID ?-Gabapentin 300 mg BID.  ?-Maintain pain medications ? ?Sacral Pressure Ulcer ?Stage 2 pressure ulcer noted. Protective foam pads in place and continue pressure offloading.  ?-CTM ? ? ? ?Best Practice (right click and "Reselect all SmartList Selections" daily)  ?Diet/type: full liquids ?DVT prophylaxis: prophylactic heparin  ?GI prophylaxis: N/A ?Lines: N/A ?Foley:  Yes ?Continuous: None ? ?Code Status:  full code ?Last date of multidisciplinary goals of care discussion.  ?Husband updated ? ?  ?The patient is critically ill with multiple organ systems failure and requires high complexity decision making for assessment and support, frequent evaluation and titration  of therapies, application of advanced monitoring technologies and extensive interpretation of multiple databases. Critical Care Time devoted to patient care services described in this note independent of APP/resident time (if applicable)  is 32 minutes.  ? ?Kara Mead MD. Shade Flood. ?[facility] ?Pager : [phone] ? ?If no response to pager , please call [phone] until 7 pm ?After 7:00 pm call Elink  [phone]  ? ?05/08/2021 ? ?

## 2021-05-08 NOTE — Progress Notes (Signed)
Patient asked me to call an update husband on needing to replace chest tube.  I call and updated with her present and she spoke to him briefly as well.   ?

## 2021-05-09 ENCOUNTER — Inpatient Hospital Stay (HOSPITAL_COMMUNITY): Payer: Medicare Other

## 2021-05-09 DIAGNOSIS — J9601 Acute respiratory failure with hypoxia: Secondary | ICD-10-CM | POA: Diagnosis not present

## 2021-05-09 DIAGNOSIS — J939 Pneumothorax, unspecified: Secondary | ICD-10-CM | POA: Diagnosis not present

## 2021-05-09 DIAGNOSIS — A419 Sepsis, unspecified organism: Secondary | ICD-10-CM | POA: Diagnosis not present

## 2021-05-09 DIAGNOSIS — R6521 Severe sepsis with septic shock: Secondary | ICD-10-CM | POA: Diagnosis not present

## 2021-05-09 LAB — BASIC METABOLIC PANEL
Anion gap: 9 (ref 5–15)
BUN: 17 mg/dL (ref 8–23)
CO2: 24 mmol/L (ref 22–32)
Calcium: 8.4 mg/dL — ABNORMAL LOW (ref 8.9–10.3)
Chloride: 106 mmol/L (ref 98–111)
Creatinine, Ser: 0.42 mg/dL — ABNORMAL LOW (ref 0.44–1.00)
GFR, Estimated: >60 mL/min (ref >60)
Glucose, Bld: 126 mg/dL — ABNORMAL HIGH (ref 70–99)
Potassium: 4.5 mmol/L (ref 3.5–5.1)
Sodium: 139 mmol/L (ref 135–145)

## 2021-05-09 LAB — MAGNESIUM: Magnesium: 1.7 mg/dL (ref 1.7–2.4)

## 2021-05-09 LAB — GLUCOSE, CAPILLARY
Glucose-Capillary: 138 mg/dL — ABNORMAL HIGH (ref 70–99)
Glucose-Capillary: 117 mg/dL — ABNORMAL HIGH (ref 70–99)
Glucose-Capillary: 165 mg/dL — ABNORMAL HIGH (ref 70–99)
Glucose-Capillary: 111 mg/dL — ABNORMAL HIGH (ref 70–99)
Glucose-Capillary: 111 mg/dL — ABNORMAL HIGH (ref 70–99)
Glucose-Capillary: 141 mg/dL — ABNORMAL HIGH (ref 70–99)

## 2021-05-09 LAB — CBC WITH DIFFERENTIAL/PLATELET
Abs Immature Granulocytes: 0.14 10*3/uL — ABNORMAL HIGH (ref 0.00–0.07)
Basophils Absolute: 0 10*3/uL (ref 0.0–0.1)
Basophils Relative: 0 %
Eosinophils Absolute: 0.2 10*3/uL (ref 0.0–0.5)
Eosinophils Relative: 2 %
HCT: 26.2 % — ABNORMAL LOW (ref 36.0–46.0)
Hemoglobin: 8.5 g/dL — ABNORMAL LOW (ref 12.0–15.0)
Immature Granulocytes: 1 %
Lymphocytes Relative: 3 %
Lymphs Abs: 0.3 10*3/uL — ABNORMAL LOW (ref 0.7–4.0)
MCH: 29.1 pg (ref 26.0–34.0)
MCHC: 32.4 g/dL (ref 30.0–36.0)
MCV: 89.7 fL (ref 80.0–100.0)
Monocytes Absolute: 0.6 10*3/uL (ref 0.1–1.0)
Monocytes Relative: 5 %
Neutro Abs: 9.4 10*3/uL — ABNORMAL HIGH (ref 1.7–7.7)
Neutrophils Relative %: 89 %
Platelets: 262 10*3/uL (ref 150–400)
RBC: 2.92 MIL/uL — ABNORMAL LOW (ref 3.87–5.11)
RDW: 15.5 % (ref 11.5–15.5)
WBC: 10.6 10*3/uL — ABNORMAL HIGH (ref 4.0–10.5)
nRBC: 0 % (ref 0.0–0.2)

## 2021-05-09 LAB — PHOSPHORUS: Phosphorus: 4 mg/dL (ref 2.5–4.6)

## 2021-05-09 LAB — PROCALCITONIN: Procalcitonin: 0.18 ng/mL

## 2021-05-09 NOTE — Progress Notes (Signed)
? ?NAME:  [name], MRN:  [id number], DOB:  October 31, 1960, LOS: 46 ?ADMISSION DATE:  04/26/2021, CONSULTATION DATE:  04/17 ?REFERRING MD:  EDP, CHIEF COMPLAINT:  Septic shock  ? ?History of Present Illness:  ?This is a 77 yof w/ h/o MS (walks w/ walker at baseline). She started being treated w/ Ocrelizumab back in Feb. Per pt and family about 4 weeks after began to notice increased RLE swelling, scaling and weakness. She attributed this to an expected side effects of the medication. Then about 5-7d prior to presentation she developed increased weakness, worsening right leg pain, poor PO intake, and neck pain. These symptoms continued to worsen to point pain was no longer bearable and she was so weak that she was no longer able to get OOB.  ?  ?In ER she was  hypotensive in 70s, tachycardic, encephalopathic, wbc ct 17.2, CO2 9 w/ + anion gap metabolic acidosis, BUN 937, creatinine 7.81 and K 6.2 ?Pertinent  Medical History  ?MS walks w/ walker, GERD, HLA B27 positive, Pulm nodule, seizure, sacroiliitis, ankylosing spondylitis, fibromyalgia ? ?Significant Hospital Events: ?Including procedures, antibiotic start and stop dates in addition to other pertinent events   ?4/17 admitted w/ septic shock working dx cellulitis (RLE), Cultures sent, MRI ordered C-spine, CT lower ext ordered, Zosyn and Vanc started  ?04/19-BiPAP placed. Another chest tube placed for persistent pneumothorax.  ?04/20-Lateral chest tube removed.  ?4/23-still with air leak in chest tube ?Mental status improved. Patient on HFNC.  ?04/25- Enlarging pneumothorax requiring another chest tube to be placed.  ?04/26-Abx course completed.  ?04/27-Large bore chest tube placed.  ?04/28-chest tube adjusted with reexpansion,CT surgery consulted. Unasyn started.  ? ?Interim History / Subjective:  ?No acute events overnight. Denies any concerns.  ?Objective   ?Blood pressure 124/64, pulse (!) 109, temperature 97.7 ?F (36.5 ?C), temperature source Oral, resp.  rate (!) 25, height 5' 4"  (1.626 m), weight 63.9 kg, SpO2 95 %. ?   ?FiO2 (%):  [60 %-80 %] 60 %  ? ?Intake/Output Summary (Last 24 hours) at 05/09/2021 0703 ?Last data filed at 05/09/2021 0600 ?Gross per 24 hour  ?Intake 2008.86 ml  ?Output 2150 ml  ?Net -141.14 ml  ? ?2.1 L urine output. ?Filed Weights  ? 05/07/21 0500 05/08/21 0500 05/09/21 0330  ?Weight: 62.1 kg 62.5 kg 63.9 kg  ? ?Examination: ?General: Chronically ill-appearing, frail woman, lying in bed ?HENT: NCAT, on HFNC. ?Lungs: rhonchi present diffusely no wheeze. Crepitus appreciated on right upper chest.  ?Cardiovascular: tachycardic, 2+ pulses.  ?Abdomen: normal bowel sounds, no TTP ?Extremities: bilateral extremities wrapped. No asymmetery ?Neuro: alert and oriented x4 ?Skin: Stage 2 sacral pressure ulcer  ?GU: catheter in place with light colored urine inside.  ? ?Labs show normal electrolytes, decreasing leukocytosis at 10k this am, stable anemia with slight drop from 9-8.5. ? ?Chest x-ray dependently reviewed shows no pneumothorax.  ?Consults  ? ?Resolved Hospital Problem list   ?Hyperkalemia s/p IVF and insulin ?Acute metabolic encephalopathy after renal fxn improvement ?AKI ?Leukocytosis ?Hypernatremia ?Assessment & Plan:  ?Acute hypoxemic respiratory failure  ?Iatrogenic pneumothorax ?Sepsis 2/2 to Pneumonia ?Pneumomediastinum ?Leukocytosis ?-Multifactorial, has chronic lung disease, along with deconditioning. Her pneumothorax intermittent worsening playing a role as well. She is s/p 2 chest tubes, no air leak and CXR shows no pneumothorax. Concern was present for pneumonia and she has completed abx. Given new leukocytosis, Unasyn started and on day 3/5. Patient was off pressors but phenylephrine gtt started 04/26 with intermittent requirement. Has been  off since yesterday. Crepitus with significant improvement as only present in upper right chest.  ?-Continue to wean oxygen supplementation as tolerated, still requiring high flow oxygen. ?-CT  surgery following; appreciate their assistance in taking care of this patient ? ?Septic shock ?Bilateral lower extremity cellulitis ?-S/p ocrelizumab for MS as outpatient therapy.  ?-Lower extremity cellulitis felt to be related to ocrelizumab ?-Was on Ceftriaxone and last day was 05/06/21. ?-Blood cultures 4/17 negative to date, mild leukocytosis which is slightly worse this am (see above).  ?-Low procalcitonin reassuring, off pressors now but continue Unasyn for pneumomediastinum ? ?Chronic obstructive pulmonary disease ?Tobacco use disorder ?Emphysema ?Left upper lobe cavitary lesion first noted 10/2020, noted to be decreasing -Dr. Melvyn Novas  ?-Duoneb nebs q 6hrs,continue  brovana, and pulmicort  ?-Nicotine patch in place ? ?Hyperglycemia ?-CGB within range. Continue SSI ? ?Acute metabolic encephalopathy/hypoactive delirium ?- waxing and waning consistent with hospital delirium. Alert and oriented this am.  ?-Continue seroquel 25 mg morning and 50 mg at bedtime.  ?-Continue BuSpar 10 mg TID ?-Gabapentin 300 mg BID.  ?-Maintain pain medications ? ?Sacral Pressure Ulcer ?Stage 2 pressure ulcer noted. Protective foam pads in place and continue pressure offloading.  ?-CTM ? ?Best Practice (right click and "Reselect all SmartList Selections" daily)  ?Diet/type: full liquids ?DVT prophylaxis: prophylactic heparin  ?GI prophylaxis: N/A ?Lines: N/A ?Foley:  Yes ?Continuous: None ? ?Code Status:  full code ?Last date of multidisciplinary goals of care discussion.  ?Husband updated ? ?  ?Idamae Schuller, MD ?Tillie Rung. [facility] ?Internal Medicine Residency, PGY-1  ?If no response to pager , please call [phone] until 7 pm ?After 7:00 pm call Elink  [phone]  ? ?05/09/2021 ? ?

## 2021-05-09 NOTE — Progress Notes (Signed)
Pt was taken off of high flow [place] and was placed on 5 L [place] salter. Pt is tolerating well at this time.  ?

## 2021-05-10 ENCOUNTER — Inpatient Hospital Stay (HOSPITAL_COMMUNITY): Payer: Medicare Other

## 2021-05-10 DIAGNOSIS — R579 Shock, unspecified: Secondary | ICD-10-CM | POA: Diagnosis not present

## 2021-05-10 LAB — GLUCOSE, CAPILLARY
Glucose-Capillary: 123 mg/dL — ABNORMAL HIGH (ref 70–99)
Glucose-Capillary: 95 mg/dL (ref 70–99)
Glucose-Capillary: 141 mg/dL — ABNORMAL HIGH (ref 70–99)
Glucose-Capillary: 106 mg/dL — ABNORMAL HIGH (ref 70–99)
Glucose-Capillary: 121 mg/dL — ABNORMAL HIGH (ref 70–99)
Glucose-Capillary: 121 mg/dL — ABNORMAL HIGH (ref 70–99)

## 2021-05-10 LAB — BASIC METABOLIC PANEL
Anion gap: 8 (ref 5–15)
BUN: 21 mg/dL (ref 8–23)
CO2: 23 mmol/L (ref 22–32)
Calcium: 8.4 mg/dL — ABNORMAL LOW (ref 8.9–10.3)
Chloride: 107 mmol/L (ref 98–111)
Creatinine, Ser: 0.53 mg/dL (ref 0.44–1.00)
GFR, Estimated: >60 mL/min (ref >60)
Glucose, Bld: 140 mg/dL — ABNORMAL HIGH (ref 70–99)
Potassium: 4.6 mmol/L (ref 3.5–5.1)
Sodium: 138 mmol/L (ref 135–145)

## 2021-05-10 LAB — CBC WITH DIFFERENTIAL/PLATELET
Abs Immature Granulocytes: 0.1 10*3/uL — ABNORMAL HIGH (ref 0.00–0.07)
Basophils Absolute: 0 10*3/uL (ref 0.0–0.1)
Basophils Relative: 0 %
Eosinophils Absolute: 0.3 10*3/uL (ref 0.0–0.5)
Eosinophils Relative: 3 %
HCT: 28.5 % — ABNORMAL LOW (ref 36.0–46.0)
Hemoglobin: 8.9 g/dL — ABNORMAL LOW (ref 12.0–15.0)
Immature Granulocytes: 1 %
Lymphocytes Relative: 4 %
Lymphs Abs: 0.3 10*3/uL — ABNORMAL LOW (ref 0.7–4.0)
MCH: 28.5 pg (ref 26.0–34.0)
MCHC: 31.2 g/dL (ref 30.0–36.0)
MCV: 91.3 fL (ref 80.0–100.0)
Monocytes Absolute: 0.5 10*3/uL (ref 0.1–1.0)
Monocytes Relative: 6 %
Neutro Abs: 6.9 10*3/uL (ref 1.7–7.7)
Neutrophils Relative %: 86 %
Platelets: 282 10*3/uL (ref 150–400)
RBC: 3.12 MIL/uL — ABNORMAL LOW (ref 3.87–5.11)
RDW: 15.9 % — ABNORMAL HIGH (ref 11.5–15.5)
WBC: 8.1 10*3/uL (ref 4.0–10.5)
nRBC: 0 % (ref 0.0–0.2)

## 2021-05-10 LAB — MAGNESIUM: Magnesium: 1.8 mg/dL (ref 1.7–2.4)

## 2021-05-10 MED ORDER — FUROSEMIDE 10 MG/ML IJ SOLN
40.0000 mg | Freq: Once | INTRAMUSCULAR | Status: AC
Start: 2021-05-10 — End: 2021-05-10
  Administered 2021-05-10: 40 mg via INTRAVENOUS
  Filled 2021-05-10: qty 4

## 2021-05-10 NOTE — Progress Notes (Signed)
Speech Language Pathology Treatment: Dysphagia  ?Patient Details ?Name: [name] ?MRN: [id number] ?DOB: August 20, 1960 ?Today's Date: 05/10/2021 ?Time: 0910-0920 ?SLP Time Calculation (min) (ACUTE ONLY): 10 min ? ?Assessment / Plan / Recommendation ?Clinical Impression ? Pt demonstrates minimal interest in PO. She will take sips if offered, but is refusing full liquid meals and doesn't appear to be requesting much to drink. SHe has a bit of delayed coughing with liquids, which may have been unrelated. She again reports painful swallowing prior to admit. Though her O2 requirements are decreasing she is very frail with two chest tubes, in pain in her legs. SLP will f/u later this week for solid trials or MBS if pt continues to improve and shows interest in food.   ?HPI HPI: Patient is a 61 y.o. female with PMH: MS, ANEMIA, GERD, HTN, IBS, seizures. She presented to the hospital c/o weakness and leg pain for 3 weeks. In the ER she was hypotensive, tachycardic and required vasopressors. She recieved 5/2 L fluid overnight and was hyperkalemic, in renal failure and had leukocytosis. CXR in ER showed pneumothorax and chest tube was placed. She is being treated for Septic shock due to cellulitis in setting of immunocompromised state improved. SLP swallow eval ordered due to RN concern for patient's ability to swallow PO's. ?  ?   ?SLP Plan ? Continue with current plan of care ? ?  ?  ?Recommendations for follow up therapy are one component of a multi-disciplinary discharge planning process, led by the attending physician.  Recommendations may be updated based on patient status, additional functional criteria and insurance authorization. ?  ? ?Recommendations  ?Diet recommendations: Thin liquid ?Medication Administration: Via alternative means ?Supervision: Staff to assist with self feeding;Patient able to self feed;Full supervision/cueing for compensatory strategies ?Compensations: Slow rate;Small sips/bites;Minimize  environmental distractions ?Postural Changes and/or Swallow Maneuvers: Seated upright 90 degrees;Upright 30-60 min after meal  ?   ?    ?   ? ? ? ? Plan: Continue with current plan of care ? ? ? ? ?  ?  ? ? ?Salaam Battershell, Riley Nearing ? ?05/10/2021, 10:27 AM ?

## 2021-05-10 NOTE — Progress Notes (Signed)
eLink Physician-Brief Progress Note ?Patient Name: [name] ?DOB: [birth year] ?MRN: [id number] ? ? ?Date of Service ? 05/10/2021  ?HPI/Events of Note ? Patient at risk of pulling out her lines and tubes, bilateral mittens have not worked in the past as she pulls the mittens off with her teeth.  ?eICU Interventions ? Bilateral soft wrist restraints ordered.  ? ? ? ?  ? ?Frederik Pear ?05/10/2021, 8:19 PM ?

## 2021-05-10 NOTE — Progress Notes (Addendum)
? ?NAME:  [name], MRN:  [id number], DOB:  06-04-1960, LOS: 68 ?ADMISSION DATE:  04/26/2021, CONSULTATION DATE:  04/17 ?REFERRING MD:  EDP, CHIEF COMPLAINT:  Septic shock  ? ?History of Present Illness:  ?This is a 53 yof w/ h/o MS (walks w/ walker at baseline). She started being treated w/ Ocrelizumab back in Feb. Per pt and family about 4 weeks after began to notice increased RLE swelling, scaling and weakness. She attributed this to an expected side effects of the medication. Then about 5-7d prior to presentation she developed increased weakness, worsening right leg pain, poor PO intake, and neck pain. These symptoms continued to worsen to point pain was no longer bearable and she was so weak that she was no longer able to get OOB.  ?  ?In ER she was  hypotensive in 70s, tachycardic, encephalopathic, wbc ct 17.2, CO2 9 w/ + anion gap metabolic acidosis, BUN 268, creatinine 7.81 and K 6.2 ?Pertinent  Medical History  ?MS walks w/ walker, GERD, HLA B27 positive, Pulm nodule, seizure, sacroiliitis, ankylosing spondylitis, fibromyalgia ? ?Significant Hospital Events: ?Including procedures, antibiotic start and stop dates in addition to other pertinent events   ?4/17 admitted w/ septic shock working dx cellulitis (RLE), Cultures sent, MRI ordered C-spine, CT lower ext ordered, Zosyn and Vanc started  ?04/19-BiPAP placed. Another chest tube placed for persistent pneumothorax.  ?04/20-Lateral chest tube removed.  ?4/23-still with air leak in chest tube ?Mental status improved. Patient on HFNC.  ?04/25- Enlarging pneumothorax requiring another chest tube to be placed.  ?04/26-Abx course completed.  ?04/27-Large bore chest tube placed.  ?04/28-chest tube adjusted with reexpansion,CT surgery consulted. Unasyn started.  ? 4/29 - Off pressors ? ?Interim History / Subjective:  ?Placed on 5 L [place] ?Both CT with no leaks even with cough ?Anterior CT placed to water seal>> CXR this afternoon at 2 PM to assess  ?Large  bore will remain to 20 mm wall suction ?Pt. Is awake and alert ?WBC 8.1, HGB 8.9, Platelets 282, HCT 28.5 , T Max 99.9 ?Na 138, K 4.6, BUN 21, Creatinine 0.53, Calcium 8.4 ?Net + 6.5 L  ?CXR with stable patchy bibasilar opacities with possible small bilateral pleural ?effusions. No pneumothorax >> Reviewed by me ? ?Objective   ?Blood pressure 107/64, pulse 94, temperature 97.6 ?F (36.4 ?C), temperature source Axillary, resp. rate 17, height 5' 4"  (1.626 m), weight 63.4 kg, SpO2 100 %. ?   ?FiO2 (%):  [50 %] 50 %  ? ?Intake/Output Summary (Last 24 hours) at 05/10/2021 0816 ?Last data filed at 05/10/2021 0800 ?Gross per 24 hour  ?Intake 1601.91 ml  ?Output 2693 ml  ?Net -1091.09 ml  ?2.1 L urine output. ?Filed Weights  ? 05/08/21 0500 05/09/21 0330 05/10/21 0500  ?Weight: 62.5 kg 63.9 kg 63.4 kg  ? ?Examination: ?General: Chronically ill-appearing, frail woman, lying in bed, awake and alert ?HENT: NCAT, on HFNC. ?Lungs: Bilateral chest excursion, Few rhonchi present diffusely minimal wheeze. Crepitus appreciated on right upper chest. CT Sites are clean dry and intact. CT without leaks even with cough ?Cardiovascular: SR-ST,  2+ pulses. No RMG appreciated ?Abdomen: normal bowel sounds, no TTP, ND. BS +. Tolerating TF at 67  ?Extremities: bilateral extremities wrapped. Pedal pulses palpable, no edema, wounds better per nursing and patient  ?Neuro: alert and oriented x4, asking appropriate questions , MAE x 4, generalized weakness  ?Skin: Stage 2 sacral pressure ulcer  ?GU: Pure [place] for output. ? ? ?Consults  ? ?  Resolved Hospital Problem list   ?Hyperkalemia s/p IVF and insulin ?Acute metabolic encephalopathy after renal fxn improvement ?AKI ?Leukocytosis ?Hypernatremia ?Assessment & Plan:  ?Acute hypoxemic respiratory failure  ?Iatrogenic pneumothorax ?Sepsis 2/2 to Pneumonia ?Pneumomediastinum ?Leukocytosis ?-Multifactorial, has chronic lung disease, along with deconditioning. Her pneumothorax intermittent worsening  playing a role as well. She is s/p 2 chest tubes, no air leak and CXR shows no pneumothorax. Concern was present for pneumonia and she has completed abx. Given  leukocytosis, Unasyn started and on day 4/5. Patient was off pressors but phenylephrine gtt started 04/26 with intermittent requirement. Has been off since 4/29. Crepitus with significant improvement as only present in upper right chest.  ?-Continue to wean oxygen supplementation as tolerated, Weaned to 5 L on 05/10/2021 ?- CT surgery following; appreciate their assistance in taking care of this patient ?- Will place anterior CT to Water Seal with CXR at 2 pm today ?- Large bore to remain to 20 cm suction  ?- Lasix as needed for effusions or net + fluid balance>> 40 mg x 1 on 05/10/2021 ? ?Septic shock ?Bilateral lower extremity cellulitis ?-S/p ocrelizumab for MS as outpatient therapy.  ?-Lower extremity cellulitis felt to be related to ocrelizumab ?-Was on Ceftriaxone and last day was 05/06/21. ?-Blood cultures 4/17 negative final , improving leukocytosis  ?-Low procalcitonin reassuring, off pressors now but continue Unasyn for pneumomediastinum, on day 4 of 5 on 05/10/2021 ? ?Chronic obstructive pulmonary disease ?Tobacco use disorder ?Emphysema ?Left upper lobe cavitary lesion first noted 10/2020, noted to be decreasing -Dr. Melvyn Novas  ?- Duoneb nebs q 6hrs,continue  brovana, and pulmicort  ?- Nicotine patch in place ?- Qualifies for Lung Cancer Screening. ? ?Hyperglycemia ?-CGB within range. Continue SSI ? ?Acute metabolic encephalopathy/hypoactive delirium ?- waxing and waning consistent with hospital delirium. Alert and oriented 05/10/21  ?-Continue seroquel 25 mg morning and 50 mg at bedtime.  ?-Continue BuSpar 10 mg TID ?-Gabapentin 300 mg BID.  ?-Maintain pain medications ? ?Sacral Pressure Ulcer ?Stage 2 pressure ulcer noted. Protective foam pads in place and continue pressure offloading.  ?-CTM ? ?Nutrition ?Tolerating TF, started on clear liquids ? ?PCCM APP  time 35 minutes ? ?Best Practice (right click and "Reselect all SmartList Selections" daily)  ?Diet/type: full liquids ?DVT prophylaxis: prophylactic heparin  ?GI prophylaxis: N/A ?Lines: N/A ?Foley:  Yes ?Continuous: None ? ?Code Status:  full code ?Last date of multidisciplinary goals of care discussion.  ?Husband updated in full 05/10/2021 by phone ? ?  ?Magdalen Spatz, MSN, AGACNP-BC ?[facility] ?See Amion for personal pager ?PCCM on call pager [phone]  ?05/10/2021 ? ?

## 2021-05-10 NOTE — Progress Notes (Signed)
Physical Therapy Treatment ?Patient Details ?Name: [name] ?MRN: [id number] ?DOB: Sep 19, 1960 ?Today's Date: 05/10/2021 ? ? ?History of Present Illness Pt adm 4/17 with septic shock due to RLE cellulitis and PNA. Pt developed acute hypoxic respiratory failure and placed on bipap 4/19. Developed iatrogenic pneumothorax and chest tube placed. PMH - Multiple Sclerosis, seizure, ankylosing spondylitis, fibromyalgia ? ?  ?PT Comments  ? ? Treatment session focused on functional bed mobility and seated balance EOB. The patient presents complaining of her back feeling "itchy" but with good arousal and command following. She requires mod A to get to/from EOB and min-mod A to maintain sitting balance. She is able to maintain sitting balance for ~8 minutes with R UE support on bed rail but has a posterior lean when unsupported. When cued to pull forward she will momentarily but quickly reverts back to the posterior lean. When first sitting she complained of lightheadedness but it improved with time. The pt continues to be limited by functional strength, balance, and mobility. Pt would benefit from continued PT services to maximize independence, transfer ability, strength, and balance. Plan and d/c recommendations remain unchanged. PT to follow up acutely as able. ?Orthostatic BPs ? ?Supine 113/58  ?Sitting 105/70  ?Sitting after 3 min 115/90  ?Supine 108/63  ? ?  ?   ?Recommendations for follow up therapy are one component of a multi-disciplinary discharge planning process, led by the attending physician.  Recommendations may be updated based on patient status, additional functional criteria and insurance authorization. ? ?Follow Up Recommendations ? PT at Long-term acute care hospital ?  ?  ?Assistance Recommended at Discharge Frequent or constant Supervision/Assistance  ?Patient can return home with the following Two people to help with walking and/or transfers;Two people to help with bathing/dressing/bathroom;Assistance  with cooking/housework;Assist for transportation;Help with stairs or ramp for entrance ?  ?Equipment Recommendations ? Hospital bed;Wheelchair (measurements PT);Wheelchair cushion (measurements PT)  ?  ?   ?Precautions / Restrictions Precautions ?Precautions: Fall;Other (comment) ?Precaution Comments: watch SpO2, 2 chest tubes, flexiseal ?Restrictions ?Weight Bearing Restrictions: No  ?  ? ?Mobility ? Bed Mobility ?Overal bed mobility: Needs Assistance ?Bed Mobility: Supine to Sit, Sit to Supine ?  ?  ?Supine to sit: +2 for physical assistance, Mod assist ?Sit to supine: +2 for physical assistance, Mod assist ?  ?General bed mobility comments: Pt able to follow commands and engage LE movements. She requires support for truncal assist and to fully sit up. Sit to supine she requires assistance lifting her LE into bed but is able to help some with truncal control. ?  ? ? ? ? ?  ? ? ? ? ? ?  ?Balance Overall balance assessment: Needs assistance ?Sitting-balance support: Feet unsupported, Single extremity supported ?Sitting balance-Leahy Scale: Poor ?Sitting balance - Comments: Pt. able to maintain sitting balance while using R UE on railing. She was able to pull forward to an upright posture with B SPT hand support. VSS on 7L [place] ?Postural control: Posterior lean ?  ?  ?  ?  ?  ?  ?  ?  ?  ?  ?  ?  ?  ?  ?  ? ?  ?Cognition Arousal/Alertness: Awake/alert ?Behavior During Therapy: [facility] for tasks assessed/performed ?Overall Cognitive Status: Impaired/Different from baseline ?Area of Impairment: Safety/judgement, Attention, Problem solving ?  ?  ?  ?  ?  ?  ?  ?  ?  ?Current Attention Level: Selective ?  ?  ?Safety/Judgement: Decreased awareness of safety ?  ?  Problem Solving: Slow processing, Decreased initiation ?General Comments: Pt complaining of back itching before session. When moving she comlains of her leg cramping and seems to hyperfocus on the discomfort. ?  ?  ? ?  ?   ?General Comments General comments (skin  integrity, edema, etc.): Pt tolerates EOB sitting well, originally complains of lightheadness that went away, BP taken multiple times and stabilized during the sit. ?  ?  ? ?Pertinent Vitals/Pain Pain Assessment ?Pain Assessment: Faces ?Faces Pain Scale: Hurts little more ?Pain Location: L LE ?Pain Descriptors / Indicators: Grimacing, Guarding, Cramping ?Pain Intervention(s): Repositioned, Monitored during session  ? ? ? ?PT Goals (current goals can now be found in the care plan section) Acute Rehab PT Goals ?Patient Stated Goal: To be able to get around at home like she was ?PT Goal Formulation: With patient ?Time For Goal Achievement: 05/17/21 ?Potential to Achieve Goals: Fair ?Progress towards PT goals: Progressing toward goals ? ?  ?Frequency ? ? ? Min 3X/week ? ? ? ?  ?PT Plan Current plan remains appropriate  ? ? ?   ?AM-PAC PT "6 Clicks" Mobility   ?Outcome Measure ? Help needed turning from your back to your side while in a flat bed without using bedrails?: A Lot ?Help needed moving from lying on your back to sitting on the side of a flat bed without using bedrails?: A Lot ?Help needed moving to and from a bed to a chair (including a wheelchair)?: Total ?Help needed standing up from a chair using your arms (e.g., wheelchair or bedside chair)?: Total ?Help needed to walk in hospital room?: Total ?Help needed climbing 3-5 steps with a railing? : Total ?6 Click Score: 8 ? ?  ?End of Session Equipment Utilized During Treatment: Oxygen ?Activity Tolerance: Patient limited by fatigue ?Patient left: in bed;with call bell/phone within reach ?Nurse Communication: Mobility status ?PT Visit Diagnosis: Other abnormalities of gait and mobility (R26.89);Muscle weakness (generalized) (M62.81);Pain ?Pain - Right/Left: Left ?Pain - part of body: Leg ?  ? ? ?Time: 2458-0998 ?PT Time Calculation (min) (ACUTE ONLY): 29 min ? ?Charges:  $Therapeutic Exercise: 8-22 mins ?$Therapeutic Activity: 8-22 mins          ?           ? ?Lorie Apley, SPT ?Acute Rehab Services ? ? ? ?Lorie Apley ?05/10/2021, 1:20 PM ? ?

## 2021-05-11 ENCOUNTER — Inpatient Hospital Stay (HOSPITAL_COMMUNITY): Payer: Medicare Other

## 2021-05-11 DIAGNOSIS — R579 Shock, unspecified: Secondary | ICD-10-CM | POA: Diagnosis not present

## 2021-05-11 LAB — BASIC METABOLIC PANEL
Anion gap: 10 (ref 5–15)
BUN: 25 mg/dL — ABNORMAL HIGH (ref 8–23)
CO2: 23 mmol/L (ref 22–32)
Calcium: 9 mg/dL (ref 8.9–10.3)
Chloride: 107 mmol/L (ref 98–111)
Creatinine, Ser: 0.6 mg/dL (ref 0.44–1.00)
GFR, Estimated: >60 mL/min (ref >60)
Glucose, Bld: 117 mg/dL — ABNORMAL HIGH (ref 70–99)
Potassium: 4.4 mmol/L (ref 3.5–5.1)
Sodium: 140 mmol/L (ref 135–145)

## 2021-05-11 LAB — CBC WITH DIFFERENTIAL/PLATELET
Abs Immature Granulocytes: 0.14 10*3/uL — ABNORMAL HIGH (ref 0.00–0.07)
Basophils Absolute: 0 10*3/uL (ref 0.0–0.1)
Basophils Relative: 1 %
Eosinophils Absolute: 0.3 10*3/uL (ref 0.0–0.5)
Eosinophils Relative: 5 %
HCT: 31.5 % — ABNORMAL LOW (ref 36.0–46.0)
Hemoglobin: 9.9 g/dL — ABNORMAL LOW (ref 12.0–15.0)
Immature Granulocytes: 3 %
Lymphocytes Relative: 6 %
Lymphs Abs: 0.4 10*3/uL — ABNORMAL LOW (ref 0.7–4.0)
MCH: 28.5 pg (ref 26.0–34.0)
MCHC: 31.4 g/dL (ref 30.0–36.0)
MCV: 90.8 fL (ref 80.0–100.0)
Monocytes Absolute: 0.5 10*3/uL (ref 0.1–1.0)
Monocytes Relative: 9 %
Neutro Abs: 4.3 10*3/uL (ref 1.7–7.7)
Neutrophils Relative %: 76 %
Platelets: 344 10*3/uL (ref 150–400)
RBC: 3.47 MIL/uL — ABNORMAL LOW (ref 3.87–5.11)
RDW: 16.6 % — ABNORMAL HIGH (ref 11.5–15.5)
WBC: 5.6 10*3/uL (ref 4.0–10.5)
nRBC: 0 % (ref 0.0–0.2)

## 2021-05-11 LAB — MAGNESIUM: Magnesium: 2 mg/dL (ref 1.7–2.4)

## 2021-05-11 LAB — GLUCOSE, CAPILLARY
Glucose-Capillary: 136 mg/dL — ABNORMAL HIGH (ref 70–99)
Glucose-Capillary: 89 mg/dL (ref 70–99)
Glucose-Capillary: 131 mg/dL — ABNORMAL HIGH (ref 70–99)
Glucose-Capillary: 116 mg/dL — ABNORMAL HIGH (ref 70–99)
Glucose-Capillary: 120 mg/dL — ABNORMAL HIGH (ref 70–99)
Glucose-Capillary: 126 mg/dL — ABNORMAL HIGH (ref 70–99)

## 2021-05-11 MED ORDER — OXYCODONE HCL 5 MG PO TABS
5.0000 mg | ORAL_TABLET | Freq: Once | ORAL | Status: AC
  Administered 2021-05-11: 5 mg
  Filled 2021-05-11: qty 1

## 2021-05-11 MED ORDER — FUROSEMIDE 10 MG/ML IJ SOLN
40.0000 mg | Freq: Once | INTRAMUSCULAR | Status: AC
  Administered 2021-05-11: 40 mg via INTRAVENOUS
  Filled 2021-05-11: qty 4

## 2021-05-11 NOTE — Progress Notes (Signed)
Occupational Therapy Treatment ?Patient Details ?Name: [name] ?MRN: [id number] ?DOB: August 19, 1960 ?Today's Date: 05/11/2021 ? ? ?History of present illness Pt adm 4/17 with septic shock due to RLE cellulitis and PNA. Pt developed acute hypoxic respiratory failure and placed on bipap 4/19. Developed iatrogenic pneumothorax and chest tube placed. PMH - Multiple Sclerosis, seizure, ankylosing spondylitis, fibromyalgia ?  ?OT comments ? Pt making slow progress toward all OT goals. Pt motivated to move and sat EOB today for simple adls. Pt able to siton EOB at times with min guard but unable to complete a task at EOB while maintaining balance. Pt fatigues quickly. Will continue to focus on energy conservation and adls up at EOB and eventually in standing.  ?  ? ?Recommendations for follow up therapy are one component of a multi-disciplinary discharge planning process, led by the attending physician.  Recommendations may be updated based on patient status, additional functional criteria and insurance authorization. ?   ?Follow Up Recommendations ? OT at Long-term acute care hospital  ?  ?Assistance Recommended at Discharge Frequent or constant Supervision/Assistance  ?Patient can return home with the following ? Two people to help with walking and/or transfers;A lot of help with bathing/dressing/bathroom ?  ?Equipment Recommendations ? Hospital bed  ?  ?Recommendations for Other Services   ? ?  ?Precautions / Restrictions Precautions ?Precautions: Fall;Other (comment) ?Precaution Comments: watch SpO2, 2 chest tubes, flexiseal ?Restrictions ?Weight Bearing Restrictions: No  ? ? ?  ? ?Mobility Bed Mobility ?Overal bed mobility: Needs Assistance ?Bed Mobility: Supine to Sit, Sit to Supine ?  ?  ?Supine to sit: Max assist ?Sit to supine: Max assist ?  ?General bed mobility comments: Pt  very weak in her trunk when moving but was able to engage and assist in sitting when given exact instructions. ?  ? ?Transfers ?  ?  ?  ?   ?  ?  ?  ?  ?  ?General transfer comment: Pt unable to clear hips from bed to attempt standing. Pt sat EOB for appx 8 min before attempt and was exhausted. ?  ?  ?Balance Overall balance assessment: Needs assistance ?Sitting-balance support: Feet unsupported, Single extremity supported ?Sitting balance-Leahy Scale: Poor ?Sitting balance - Comments: Pt could support self on EOB in sittng for brief moments but fatiguedq quickly and had posterior lean. ?Postural control: Posterior lean ?  ?  ?Standing balance comment: unable to stand ?  ?  ?  ?  ?  ?  ?  ?  ?  ?  ?  ?   ? ?ADL either performed or assessed with clinical judgement  ? ?ADL Overall ADL's : Needs assistance/impaired ?Eating/Feeding: Set up;Sitting;Bed level ?Eating/Feeding Details (indicate cue type and reason): also attempted EOB. Pt very weak and needs total assist to maintain balance if she is focused on feeding self. ?Grooming: Minimal assistance;Sitting ?Grooming Details (indicate cue type and reason): washed face in bed with set up. ?  ?  ?  ?  ?  ?  ?  ?  ?  ?  ?Toileting- Clothing Manipulation and Hygiene: Total assistance;Bed level ?Toileting - Clothing Manipulation Details (indicate cue type and reason): flexiseal and purewik ?  ?  ?Functional mobility during ADLs: Maximal assistance ?General ADL Comments: Focused on feeding and grooming on EOB. pt very willing to sit EOB but fatigued very quickly. had difficult time maintaining balance while doing a task. ?  ? ?Extremity/Trunk Assessment Upper Extremity Assessment ?Upper Extremity Assessment: Generalized weakness ?  ?  Lower Extremity Assessment ?Lower Extremity Assessment: Defer to PT evaluation ?  ?  ?  ? ?Vision   ?Vision Assessment?: No apparent visual deficits ?  ?Perception Perception ?Perception: Within Functional Limits ?  ?Praxis Praxis ?Praxis: Intact ?  ? ?Cognition Arousal/Alertness: Awake/alert ?Behavior During Therapy: Anxious ?Overall Cognitive Status: Impaired/Different from  baseline ?Area of Impairment: Attention, Safety/judgement, Awareness, Problem solving ?  ?  ?  ?  ?  ?  ?  ?  ?  ?Current Attention Level: Selective ?  ?Following Commands: Follows one step commands consistently, Follows one step commands with increased time ?Safety/Judgement: Decreased awareness of safety ?Awareness: Emergent ?Problem Solving: Slow processing, Decreased initiation ?General Comments: Pt very anxious with mobility and trying new things but did well with encouragment. ?  ?  ?   ?Exercises Other Exercises ?Other Exercises: Trunk rotation in sitting forward x 5 ?Other Exercises: Facilitated trunk extension x 5 in sitting forward ? ?  ?Shoulder Instructions   ? ? ?  ?General Comments Pt limited by fatigue, pain, anxiety and multiple lines.  ? ? ?Pertinent Vitals/ Pain       Pain Assessment ?Pain Assessment: 0-10 ?Pain Score: 2  ?Pain Location: all over ?Pain Descriptors / Indicators: Grimacing, Sore ?Pain Intervention(s): Limited activity within patient's tolerance, Monitored during session, Repositioned, Premedicated before session ? ?Home Living   ?  ?  ?  ?  ?  ?  ?  ?  ?  ?  ?  ?  ?  ?  ?  ?  ?  ?  ? ?  ?Prior Functioning/Environment    ?  ?  ?  ?   ? ?Frequency ? Min 2X/week  ? ? ? ? ?  ?Progress Toward Goals ? ?OT Goals(current goals can now be found in the care plan section) ? Progress towards OT goals: Progressing toward goals ? ?Acute Rehab OT Goals ?Patient Stated Goal: to get stronger ?OT Goal Formulation: With patient ?Time For Goal Achievement: 05/18/21 ?Potential to Achieve Goals: Fair ?ADL Goals ?Pt Will Perform Upper Body Bathing: with min guard assist;sitting ?Pt Will Perform Lower Body Bathing: with min assist;sit to/from stand ?Pt Will Transfer to Toilet: with min assist;stand pivot transfer;bedside commode ?Additional ADL Goal #1: Pt to implement at least 3 energy conservation strategies during functional tasks ?Additional ADL Goal #2: Pt to improve activity tolerance > 5 min with  stable vitals and decreased anxiety  ?Plan Discharge plan remains appropriate   ? ?Co-evaluation ? ? ?   ?  ?  ?  ?  ? ?  ?AM-PAC OT "6 Clicks" Daily Activity     ?Outcome Measure ? ? Help from another person eating meals?: A Little ?Help from another person taking care of personal grooming?: A Little ?Help from another person toileting, which includes using toliet, bedpan, or urinal?: Total ?Help from another person bathing (including washing, rinsing, drying)?: A Lot ?Help from another person to put on and taking off regular upper body clothing?: A Lot ?Help from another person to put on and taking off regular lower body clothing?: Total ?6 Click Score: 12 ? ?  ?End of Session Equipment Utilized During Treatment: Oxygen ? ?OT Visit Diagnosis: Unsteadiness on feet (R26.81);Other abnormalities of gait and mobility (R26.89);Muscle weakness (generalized) (M62.81) ?  ?Activity Tolerance Patient limited by fatigue ?  ?Patient Left in bed;with call bell/phone within reach;with bed alarm set ?  ?Nurse Communication Mobility status ?  ? ?   ? ?Time: [id number] ?OT  Time Calculation (min): 31 min ? ?Charges: OT General Charges ?$OT Visit: 1 Visit ?OT Treatments ?$Self Care/Home Management : 23-37 mins ? ? ?Glenford Peers ?05/11/2021, 11:05 AM ?

## 2021-05-11 NOTE — Progress Notes (Signed)
? ?NAME:  [name], MRN:  [id number], DOB:  19-Apr-1960, LOS: 19 ?ADMISSION DATE:  04/26/2021, CONSULTATION DATE:  04/17 ?REFERRING MD:  EDP, CHIEF COMPLAINT:  Septic shock  ? ?History of Present Illness:  ?This is a 68 yof w/ h/o MS (walks w/ walker at baseline). She started being treated w/ Ocrelizumab back in Feb. Per pt and family about 4 weeks after began to notice increased RLE swelling, scaling and weakness. She attributed this to an expected side effects of the medication. Then about 5-7d prior to presentation she developed increased weakness, worsening right leg pain, poor PO intake, and neck pain. These symptoms continued to worsen to point pain was no longer bearable and she was so weak that she was no longer able to get OOB.  ?  ?In ER she was  hypotensive in 70s, tachycardic, encephalopathic, wbc ct 17.2, CO2 9 w/ + anion gap metabolic acidosis, BUN 412, creatinine 7.81 and K 6.2 ?Pertinent  Medical History  ?MS walks w/ walker, GERD, HLA B27 positive, Pulm nodule, seizure, sacroiliitis, ankylosing spondylitis, fibromyalgia ? ?Significant Hospital Events: ?Including procedures, antibiotic start and stop dates in addition to other pertinent events   ?4/17 admitted w/ septic shock working dx cellulitis (RLE), Cultures sent, MRI ordered C-spine, CT lower ext ordered, Zosyn and Vanc started  ?04/19-BiPAP placed. Another chest tube placed for persistent pneumothorax.  ?04/20-Lateral chest tube removed.  ?4/23-still with air leak in chest tube ?Mental status improved. Patient on HFNC.  ?04/25- Enlarging pneumothorax requiring another chest tube to be placed.  ?04/26-Abx course completed.  ?04/27-Large bore chest tube placed.  ?04/28-chest tube adjusted with reexpansion,CT surgery consulted. Unasyn started.  ? 4/29 - Off pressors ?5/1 PTX resolved with both chest tubes to water seal, no air leak in tubes ? ?Interim History / Subjective:  ? ?A little agitated overnight, requiring application of soft wrist  restraints as she was pulling supplemental O2 off. Doing well this morning. ? ?Objective   ?Blood pressure (!) 111/54, pulse 94, temperature 98.5 ?F (36.9 ?C), temperature source Oral, resp. rate 19, height _0  (1.626 m), weight 62.7 kg, SpO2 100 %. ?   ?   ? ?Intake/Output Summary (Last 24 hours) at 05/11/2021 0743 ?Last data filed at 05/11/2021 0700 ?Gross per 24 hour  ?Intake 2140.29 ml  ?Output 2638 ml  ?Net -497.71 ml  ?2.1 L urine output. ?Filed Weights  ? 05/09/21 0330 05/10/21 0500 05/11/21 0500  ?Weight: 63.9 kg 63.4 kg 62.7 kg  ? ?Examination: ?General appearance: 61 y.o., female, NAD, conversant  ?Eyes: PERRL, tracking appropriately ?HENT: NCAT; dry MM ?Lungs: CTAB, no crackles, no wheeze, with normal respiratory effort ?CV: RRR, no murmur  ?Abdomen: Soft, non-tender; non-distended, BS present  ?Extremities: No peripheral edema, warm ?Skin: Normal turgor and texture; no rash ?Psych: Appropriate affect ?Neuro: Attentive, grossly nonfocal ? ?Chest tubes to water seal without air leak ? ?CXR reviewed by me no PTX ? ?Chemistries, CBC stable, no new culture data ? ?Consults  ? ?Resolved Hospital Problem list   ?Hyperkalemia s/p IVF and insulin ?Acute metabolic encephalopathy after renal fxn improvement ?AKI ?Leukocytosis ?Hypernatremia ?Assessment & Plan:  ?Acute hypoxemic respiratory failure  ?Iatrogenic pneumothorax ?Septic shock 2/2 to Pneumonia, shock resolved ?Pneumomediastinum ?-unasyn day 4/5 ?-Continue to wean oxygen supplementation as tolerated ?-CT surgery has evaluated; appreciate their assistance in taking care of this patient ?-Chest tubes clamped today with repeat CXR at 1100. If stable CXR then will remove anterior pigtail and repeat CXR at  1500, consider removal of 68F chest tube later ? ?Chronic obstructive pulmonary disease ?Tobacco use disorder ?Emphysema ?Left upper lobe cavitary lesion first noted 10/2020, noted to be decreasing -Dr. Melvyn Novas  ?- Duoneb nebs q 6hrs,continue  brovana, and  pulmicort  ?- Nicotine patch in place ?- Qualifies for Lung Cancer Screening ? ?Hyperglycemia ?-CGB within range. Continue SSI ? ?Acute metabolic encephalopathy/hypoactive delirium ?Waxing and waning course consistent with hospital delirium.  ?-Continue seroquel 25 mg morning and 50 mg at bedtime.  ?-Continue BuSpar 10 mg TID ?-Gabapentin 300 mg BID.  ?-Maintain pain medications ?-Delirium precautions ? ?Sacral Pressure Ulcer ?Stage 2 pressure ulcer noted. Protective foam pads in place and continue pressure offloading.  ?-CTM ? ?Nutrition ?Tolerating TF, started on clear liquids ? ?Best Practice (right click and "Reselect all SmartList Selections" daily)  ?Diet/type: full liquids ?DVT prophylaxis: prophylactic heparin  ?GI prophylaxis: N/A ?Lines: N/A ?Foley:  Yes ?Continuous: None ? ?Code Status:  full code ?Last date of multidisciplinary goals of care discussion.  ?Husband updated in full 05/10/2021 by phone ? ?This patient is critically ill with acute hypoxic respiratory failure; which, requires frequent high complexity decision making, assessment, support, evaluation, and titration of therapies. This was completed through the application of advanced monitoring technologies and extensive interpretation of multiple databases. During this encounter critical care time was devoted to patient care services described in this note for 34 minutes. ? ? ?Walker Shadow  ?[place] ? ?05/11/2021 ? ?

## 2021-05-12 ENCOUNTER — Inpatient Hospital Stay (HOSPITAL_COMMUNITY): Payer: Medicare Other

## 2021-05-12 DIAGNOSIS — R579 Shock, unspecified: Secondary | ICD-10-CM | POA: Diagnosis not present

## 2021-05-12 LAB — BASIC METABOLIC PANEL
Anion gap: 10 (ref 5–15)
BUN: 32 mg/dL — ABNORMAL HIGH (ref 8–23)
CO2: 26 mmol/L (ref 22–32)
Calcium: 8.8 mg/dL — ABNORMAL LOW (ref 8.9–10.3)
Chloride: 104 mmol/L (ref 98–111)
Creatinine, Ser: 0.6 mg/dL (ref 0.44–1.00)
GFR, Estimated: >60 mL/min (ref >60)
Glucose, Bld: 122 mg/dL — ABNORMAL HIGH (ref 70–99)
Potassium: 4.3 mmol/L (ref 3.5–5.1)
Sodium: 140 mmol/L (ref 135–145)

## 2021-05-12 LAB — CBC
HCT: 28.6 % — ABNORMAL LOW (ref 36.0–46.0)
Hemoglobin: 8.9 g/dL — ABNORMAL LOW (ref 12.0–15.0)
MCH: 28 pg (ref 26.0–34.0)
MCHC: 31.1 g/dL (ref 30.0–36.0)
MCV: 89.9 fL (ref 80.0–100.0)
Platelets: 319 10*3/uL (ref 150–400)
RBC: 3.18 MIL/uL — ABNORMAL LOW (ref 3.87–5.11)
RDW: 16.6 % — ABNORMAL HIGH (ref 11.5–15.5)
WBC: 6.3 10*3/uL (ref 4.0–10.5)
nRBC: 0 % (ref 0.0–0.2)

## 2021-05-12 LAB — GLUCOSE, CAPILLARY
Glucose-Capillary: 117 mg/dL — ABNORMAL HIGH (ref 70–99)
Glucose-Capillary: 113 mg/dL — ABNORMAL HIGH (ref 70–99)
Glucose-Capillary: 132 mg/dL — ABNORMAL HIGH (ref 70–99)
Glucose-Capillary: 113 mg/dL — ABNORMAL HIGH (ref 70–99)
Glucose-Capillary: 97 mg/dL (ref 70–99)
Glucose-Capillary: 147 mg/dL — ABNORMAL HIGH (ref 70–99)

## 2021-05-12 MED ORDER — ENSURE ENLIVE PO LIQD
237.0000 mL | Freq: Three times a day (TID) | ORAL | Status: DC
  Administered 2021-05-13 – 2021-05-18 (×3): 237 mL via ORAL

## 2021-05-12 MED ORDER — OSMOLITE 1.2 CAL PO LIQD: 1000.0000 mL | ORAL | Status: DC

## 2021-05-12 MED ORDER — OSMOLITE 1.2 CAL PO LIQD
1000.0000 mL | ORAL | Status: DC
  Administered 2021-05-12 (×2): 1000 mL
  Filled 2021-05-12 (×2): qty 1000

## 2021-05-12 NOTE — Progress Notes (Signed)
? ?NAME:  [name], MRN:  [id number], DOB:  1960-12-15, LOS: 47 ?ADMISSION DATE:  04/26/2021, CONSULTATION DATE:  04/17 ?REFERRING MD:  EDP, CHIEF COMPLAINT:  Septic shock  ? ?History of Present Illness:  ?This is a 61 yof w/ h/o MS (walks w/ walker at baseline). She started being treated w/ Ocrelizumab back in Feb. Per pt and family about 4 weeks after began to notice increased RLE swelling, scaling and weakness. She attributed this to an expected side effects of the medication. Then about 5-7d prior to presentation she developed increased weakness, worsening right leg pain, poor PO intake, and neck pain. These symptoms continued to worsen to point pain was no longer bearable and she was so weak that she was no longer able to get OOB.  ?  ?In ER she was  hypotensive in 70s, tachycardic, encephalopathic, wbc ct 17.2, CO2 9 w/ + anion gap metabolic acidosis, BUN 326, creatinine 7.81 and K 6.2 ?Pertinent  Medical History  ?MS walks w/ walker, GERD, HLA B27 positive, Pulm nodule, seizure, sacroiliitis, ankylosing spondylitis, fibromyalgia ? ?Significant Hospital Events: ?Including procedures, antibiotic start and stop dates in addition to other pertinent events   ?4/17 admitted w/ septic shock working dx cellulitis (RLE), Cultures sent, MRI ordered C-spine, CT lower ext ordered, Zosyn and Vanc started  ?04/19-BiPAP placed. Another chest tube placed for persistent pneumothorax.  ?04/20-Lateral chest tube removed.  ?4/23-still with air leak in chest tube ?Mental status improved. Patient on HFNC.  ?04/25- Enlarging pneumothorax requiring another chest tube to be placed.  ?04/26-Abx course completed.  ?04/27-Large bore chest tube placed.  ?04/28-chest tube adjusted with reexpansion,CT surgery consulted. Unasyn started.  ? 4/29 - Off pressors ?5/1 PTX resolved with both chest tubes to water seal, no air leak in tubes ? ?Interim History / Subjective:  ? ?Chest tubes removed yesterday without recurrence of PTX. A little  agitated again in the evening.  ? ?Objective   ?Blood pressure 99/70, pulse (!) 110, temperature 97.6 ?F (36.4 ?C), temperature source Oral, resp. rate 16, height 5' 4"  (1.626 m), weight 61.2 kg, SpO2 96 %. ?   ?   ? ?Intake/Output Summary (Last 24 hours) at 05/12/2021 1403 ?Last data filed at 05/12/2021 0800 ?Gross per 24 hour  ?Intake 1979.9 ml  ?Output 1210 ml  ?Net 769.9 ml  ?2.1 L urine output. ?Filed Weights  ? 05/10/21 0500 05/11/21 0500 05/12/21 0445  ?Weight: 63.4 kg 62.7 kg 61.2 kg  ? ?Examination: ?General appearance: 61 y.o., female, NAD, female, NAD, conversant  ?Eyes: PERRL, tracking appropriately ?HENT: NCAT; dry MM ?Lungs: CTAB, with normal respiratory effort ?CV: RRR, no murmur  ?Abdomen: Soft, non-tender; non-distended, BS present  ?Extremities: No peripheral edema, warm ?Skin: Normal turgor and texture; no rash ?Neuro: Attentive, grossly nonfocal ? ?CXR reviewed by me no PTX but ?developing retrocardiac opacity ? ?Chemistries, CBC stable, no new culture data ? ?Consults  ? ?Resolved Hospital Problem list   ?Hyperkalemia s/p IVF and insulin ?Acute metabolic encephalopathy after renal fxn improvement ?AKI ?Leukocytosis ?Hypernatremia ?Assessment & Plan:  ?Acute hypoxemic respiratory failure, improving ?Iatrogenic pneumothorax, resolved ?Septic shock 2/2 to Pneumonia, shock resolved ?Pneumomediastinum, resolved ?-unasyn day 5/5 ?-Continue to wean oxygen supplementation as tolerated ?-aspiration precautions ?-PT, OOB to chair, mobilize ? ?Chronic obstructive pulmonary disease ?Tobacco use disorder ?Emphysema ?Left upper lobe cavitary lesion first noted 10/2020, noted to be decreasing -Dr. Melvyn Novas  ?- Duoneb nebs q 6hrs,continue  brovana, and pulmicort  ?- Nicotine patch in place ?- Qualifies for  Lung Cancer Screening ? ?Hyperglycemia ?-CGB within range. Continue SSI ? ?Acute metabolic encephalopathy/hypoactive delirium ?Waxing and waning course consistent with hospital delirium.  ?-Continue seroquel 25 mg morning and 50  mg at bedtime.  ?-Continue BuSpar 10 mg TID ?-Gabapentin 300 mg BID.  ?-Maintain pain medications ?-Delirium precautions ? ?Sacral Pressure Ulcer ?Stage 2 pressure ulcer noted. Protective foam pads in place and continue pressure offloading.  ?-CTM ? ?Nutrition ?Tolerating TF, started on full liquids ? ?Best Practice (right click and "Reselect all SmartList Selections" daily)  ?Diet/type: full liquids ?DVT prophylaxis: prophylactic heparin  ?GI prophylaxis: N/A ?Lines: N/A ?Foley:  Yes - order placed for removal ?Continuous: None ? ?Code Status:  full code ?Last date of multidisciplinary goals of care discussion.  ?Husband updated in full 05/10/2021 by phone ? ? ? ?Walker Shadow  ?[place] ? ?05/12/2021 ? ?

## 2021-05-12 NOTE — TOC Progression Note (Signed)
Transition of Care (TOC) - Progression Note  ? ? ?Patient Details  ?Name: [name] ?MRN: [id number] ?Date of Birth: 1960-11-17 ? ?Transition of Care (TOC) CM/SW Contact  ?Lorri Frederick, LCSW ?Phone Number: ?05/12/2021, 2:29 PM ? ?Clinical Narrative:    ?CSW spoke with [facility].  Insurance auth still under appeal.  CSW informed by PT that SNF would be appropriate back up plan, CSW spoke with pt about sending referral out for SNF as back up plan and pt is agreeable to this.  Choice document given, permission given to send out referral in hub.  ? ?CSW LM with pt husband, Windy Fast.   ? ?Referral sent out on hub for SNF. ? ? ?Expected Discharge Plan: [facility] Term Acute Care ([facility]) ?Barriers to Discharge: Other (must enter comment) (acceptance and insurance approval for LTAC) ? ?Expected Discharge Plan and Services ?Expected Discharge Plan: Long Term Acute Care (LT[facility]) ?In-house Referral: Clinical Social Work ?  ?Post Acute Care Choice: Long Term Acute Care (LT[facility]) ?Living arrangements for the past 2 months: Single Family Home ?                ?  ?  ?  ?  ?  ?  ?  ?  ?  ?  ? ? ?Social Determinants of Health (SDOH) Interventions ?  ? ?Readmission Risk Interventions ?   ? View : No data to display.  ?  ?  ?  ? ? ?

## 2021-05-12 NOTE — Progress Notes (Signed)
Nutrition Follow-up ? ?DOCUMENTATION CODES:  ? ?Not applicable ? ?INTERVENTION:  ? ?Change to nocturnal tube feeding via Cortrak: ?Osmolite 1.2 at 70 ml/h x 14 hours (980 ml per day) ?Prosource TF 45 ml BID ? ?Provides 1256 kcal, 76 gm protein, 804 ml free water daily. ? ?Continue MVI with minerals daily via tube. ? ?Add PO supplements: Ensure Enlive po TID, each supplement provides 350 kcal and 20 grams of protein. ? ?NUTRITION DIAGNOSIS:  ? ?Increased nutrient needs related to acute illness, wound healing as evidenced by estimated needs. ? ?Ongoing  ? ?GOAL:  ? ?Patient will meet greater than or equal to 90% of their needs ? ?Met with TF ? ?MONITOR:  ? ?TF tolerance, Skin, Labs ? ?REASON FOR ASSESSMENT:  ? ?Consult ?Enteral/tube feeding initiation and management ? ?ASSESSMENT:  ? ?61 yo female admitted with hypovolemic/septic shock r/t BLE cellulitis. PMH includes GERD, HTN, IBS, seizures, MS. ? ?Discussed patient in ICU rounds and with RN today. ?Chest tubes removed Tuesday. ?Cortrak placed 4/19, tip is gastric. ?Currently receiving Osmolite 1.2 at 60 ml/h with Prosource TF 45 ml BID. ?Tolerating TF without difficulty. ? ?SLP following. Patient remains on full liquids.  ?Will add PO supplements and change to nocturnal tube feedings. ? ?Labs reviewed.  ?CBG: [phone] ? ?Medications reviewed and include Novolog, Keppra, MVI with minerals. ? ?Diet Order:   ?Diet Order   ? ?       ?  Diet full liquid Room service appropriate? Yes; Fluid consistency: Thin  Diet effective now       ?  ? ?  ?  ? ?  ? ? ?EDUCATION NEEDS:  ? ?Not appropriate for education at this time ? ?Skin:  Skin Assessment: Skin Integrity Issues: ?Skin Integrity Issues:: Other (Comment), Stage II ?Stage II: sacrum ?Other: bilateral pretibial wounds/cellulitis; R foot non-pressure wound; MASD to sacrum & coccyx ? ?Last BM:  5/3 type 6 ? ?Height:  ? ?Ht Readings from Last 1 Encounters:  ?04/26/21 _0  (1.626 m)  ? ? ?Weight:  ? ?Wt Readings from  Last 1 Encounters:  ?05/12/21 61.2 kg  ? ? ?BMI:  Body mass index is 23.16 kg/m?. ? ?Estimated Nutritional Needs:  ? ?Kcal:  1700-1900 ? ?Protein:  95-110 gm ? ?Fluid:  1.7-1.9 L ? ? ? ?Lucas Mallow RD, LDN, CNSC ?Please refer to Amion for contact information.                                                       ? ?

## 2021-05-12 NOTE — Progress Notes (Signed)
Physical Therapy Treatment ?Patient Details ?Name: [name] ?MRN: [id number] ?DOB: [birth year] ?Today's Date: 05/12/2021 ? ? ?History of Present Illness Pt adm 4/17 with septic shock due to RLE cellulitis and PNA. Pt developed acute hypoxic respiratory failure and placed on bipap 4/19. Developed iatrogenic pneumothorax and chest tube placed. PMH - Multiple Sclerosis, seizure, ankylosing spondylitis, fibromyalgia ? ?  ?PT Comments  ? ? Continued progression of functional mobility and transfers today. The patient had her chest tubes removed and was more willing to participate in mobility to day. She requires mod A to get to EOB and it able to maintain unsupported sitting for a short time. 2 attempts to stand were made but patient was unable to clear hips from bed, she became anxious during each attempt. She then performed a lateral scoot x3 to get to the chair and required max A +2 for hip clearance and sequencing cues. She continues to be limited by overall functional strength, balance, and endurance, as well as increased anxiety with new movements. Pt would benefit from continued PT services to maximize her transfer ability, strength, and balance. Plan and d/c recommendations remain unchanged. PT to follow up acutely as able.  ?  ?Recommendations for follow up therapy are one component of a multi-disciplinary discharge planning process, led by the attending physician.  Recommendations may be updated based on patient status, additional functional criteria and insurance authorization. ? ?Follow Up Recommendations ? PT at Long-term acute care hospital ?  ?  ?Assistance Recommended at Discharge Frequent or constant Supervision/Assistance  ?Patient can return home with the following Two people to help with walking and/or transfers;Two people to help with bathing/dressing/bathroom;Assistance with cooking/housework;Assist for transportation;Help with stairs or ramp for entrance ?  ?Equipment Recommendations ? Hospital  bed;Wheelchair (measurements PT);Wheelchair cushion (measurements PT)  ?  ?   ?Precautions / Restrictions Precautions ?Precautions: Fall;Other (comment) ?Precaution Comments: watch SpO2, flexiseal ?Restrictions ?Weight Bearing Restrictions: No  ?  ? ?Mobility ? Bed Mobility ?Overal bed mobility: Needs Assistance ?Bed Mobility: Supine to Sit ?  ?  ?Supine to sit: Max assist ?  ?  ?General bed mobility comments: Pt initiates LE movement but requires assit to get them fully off the bed, requires trunk assist to come up to sit ?  ? ?Transfers ?Overall transfer level: Needs assistance ?  ?Transfers: Bed to chair/wheelchair/BSC ?  ?  ?  ?  ?  ? Lateral/Scoot Transfers: Max assist, +2 physical assistance ?General transfer comment: Attempted stand and pt unable to lift hips from bed with +2 assist, Lateral scoot to chair performed, 3 scoots, pt initiated movement but requires assist for hip boost and sequencing cues ?  ? ? ? ? ?  ?Balance Overall balance assessment: Needs assistance ?Sitting-balance support: Feet supported ?Sitting balance-Leahy Scale: Fair ?Sitting balance - Comments: Pt able to sit EOB shortly w/o support and able to lean to help with lateral scoot ?  ?  ?  ?Standing balance comment: unable to stand ?  ?  ?  ?  ?  ?  ?  ?  ?  ?  ?  ?  ? ?  ?Cognition Arousal/Alertness: Awake/alert ?Behavior During Therapy: Anxious ?Overall Cognitive Status: Impaired/Different from baseline ?Area of Impairment: Attention, Safety/judgement, Awareness, Problem solving ?  ?  ?  ?  ?  ?  ?  ?  ?  ?Current Attention Level: Selective ?  ?  ?Safety/Judgement: Decreased awareness of safety, Decreased awareness of deficits ?Awareness: Emergent ?Problem Solving: Slow processing,  Decreased initiation ?General Comments: Pt very anxious during mobility, but she was willing to try. ?  ?  ? ?  ?   ?General Comments General comments (skin integrity, edema, etc.): Pt remained anxious during session, worrying she would hurt the SPT + PT.  She was encouraged that she would not and was willing to scoot to chair. ?  ?  ? ?Pertinent Vitals/Pain Pain Assessment ?Faces Pain Scale: Hurts even more ?Pain Location: L quad ?Pain Descriptors / Indicators: Grimacing, Cramping ?Pain Intervention(s): Limited activity within patient's tolerance, Repositioned  ? ? ? ?PT Goals (current goals can now be found in the care plan section) Acute Rehab PT Goals ?Patient Stated Goal: To be able to get around at home like she was ?PT Goal Formulation: With patient ?Time For Goal Achievement: 05/17/21 ?Potential to Achieve Goals: Fair ?Progress towards PT goals: Progressing toward goals ? ?  ?Frequency ? ? ? Min 3X/week ? ? ? ?  ?PT Plan Current plan remains appropriate  ? ? ?   ?AM-PAC PT "6 Clicks" Mobility   ?Outcome Measure ? Help needed turning from your back to your side while in a flat bed without using bedrails?: A Lot ?Help needed moving from lying on your back to sitting on the side of a flat bed without using bedrails?: A Lot ?Help needed moving to and from a bed to a chair (including a wheelchair)?: Total ?Help needed standing up from a chair using your arms (e.g., wheelchair or bedside chair)?: Total ?Help needed to walk in hospital room?: Total ?Help needed climbing 3-5 steps with a railing? : Total ?6 Click Score: 8 ? ?  ?End of Session Equipment Utilized During Treatment: Oxygen ?Activity Tolerance: Patient limited by pain ?Patient left: in chair;with call bell/phone within reach;with chair alarm set ?Nurse Communication: Mobility status ?PT Visit Diagnosis: Other abnormalities of gait and mobility (R26.89);Muscle weakness (generalized) (M62.81);Pain ?Pain - Right/Left: Left ?Pain - part of body: Leg ?  ? ? ?Time: :[id number] ?PT Time Calculation (min) (ACUTE ONLY): 32 min ? ?Charges:  $Therapeutic Activity: 23-37 mins          ?          ? ?Thermon Leyland, SPT ?Acute Rehab Services ? ? ? ?Thermon Leyland ?05/12/2021, 9:41 AM ? ?

## 2021-05-12 NOTE — [place] FL2 (Signed)
?[place] MEDICAID FL2 LEVEL OF CARE SCREENING TOOL  ?  ? ?IDENTIFICATION  ?Patient Name: ?[name] Birthdate: 10-30-1960 Sex: female Admission Date (Current Location): ?04/26/2021  ?[place] and [place] Number: ? [place] ?  Facility and Address:  ?[facility], [place] [address], [place], [place] [address] ?     Provider Number: ?[id number]  ?Attending Physician Name and Address:  ?Maryjane Hurter, MD ? Relative Name and Phone Number:  ?Pardini,Ronald Spouse [phone]  [phone] ?   ?Current Level of Care: ?Hospital Recommended Level of Care: ?[place] Prior Approval Number: ?  ? ?Date Approved/Denied: ?  PASRR Number: ?[id number] A ? ?Discharge Plan: ?SNF ?  ? ?Current Diagnoses: ?Patient Active Problem List  ? Diagnosis Date Noted  ? Acute respiratory failure with hypoxia ([place])   ? Pressure injury of skin 05/08/2021  ? HLA B27 (HLA B27 positive) 04/26/2021  ? Shock ([place]) 04/26/2021  ? Cellulitis of both lower extremities 04/26/2021  ? High anion gap metabolic acidosis 94/85/4627  ? Hyperkalemia 04/26/2021  ? Acute metabolic encephalopathy 03/50/0938  ? Pneumothorax on right 04/26/2021  ? Anemia   ? GERD (gastroesophageal reflux disease)   ? Seizures ([place])   ? Pulmonary nodule   ? MS (multiple sclerosis) ([place])   ? AKI (acute kidney injury) ([place])   ? Septic shock ([place])   ? COPD  GOLD ? / still smoking  11/02/2020  ? Multiple pulmonary nodules determined by computed tomography of lung 11/02/2020  ? Seizure disorder ([place]) 09/27/2012  ? ? ?Orientation RESPIRATION BLADDER Height & Weight   ?  ?Self, Time, Situation, Place ? O2 Incontinent Weight: 134 lb 14.7 oz (61.2 kg) ?Height:  5' 4"  (162.6 cm)  ?BEHAVIORAL SYMPTOMS/MOOD NEUROLOGICAL BOWEL NUTRITION STATUS  ?  Convulsions/Seizures Incontinent NG/panda  ?AMBULATORY STATUS COMMUNICATION OF NEEDS Skin   ?Total Care Verbally Other (Comment) (ecchymosis, redness) ?  ?  ?  ?    ?     ?     ? ? ?Personal Care Assistance  Level of Assistance  ?Bathing, Feeding, Dressing, Total care Bathing Assistance: Maximum assistance ?Feeding assistance: Limited assistance ?Dressing Assistance: Maximum assistance ?Total Care Assistance: Maximum assistance  ? ?Functional Limitations Info  ?Sight, Hearing, Speech Sight Info: Adequate ?Hearing Info: Adequate ?Speech Info: Adequate  ? ? ?SPECIAL CARE FACTORS FREQUENCY  ?PT (By licensed PT), OT (By licensed OT)   ?  ?PT Frequency: 5x week ?OT Frequency: 5x week ?  ?  ?  ?   ? ? ?Contractures Contractures Info: Not present  ? ? ?Additional Factors Info  ?Code Status, Allergies Code Status Info: full ?Allergies Info: Lamictal (Lamotrigine), Other, Tramadol ?  ?  ?  ?   ? ?Current Medications (05/12/2021):  This is the current hospital active medication list ?Current Facility-Administered Medications  ?Medication Dose Route Frequency Provider Last Rate Last Admin  ? 0.9 %  sodium chloride infusion   Intravenous PRN Olalere, Adewale A, MD 10 mL/hr at 05/12/21 0700 Infusion Verify at 05/12/21 0700  ? 0.9 %  sodium chloride infusion  250 mL Intravenous Continuous Frederik Pear, MD   Held at 05/06/21 0000  ? acetaminophen (TYLENOL) 160 MG/5ML solution 650 mg  650 mg Oral Q6H PRN Mauri Brooklyn, MD   650 mg at 05/11/21 1308  ? albuterol (PROVENTIL) (2.5 MG/3ML) 0.083% nebulizer solution 2.5 mg  2.5 mg Nebulization Q4H PRN Olalere, Adewale A, MD   2.5 mg at 05/08/21 0141  ? arformoterol (  BROVANA) nebulizer solution 15 mcg  15 mcg Nebulization BID Rigoberto Noel, MD   15 mcg at 05/12/21 0846  ? budesonide (PULMICORT) nebulizer solution 0.5 mg  0.5 mg Nebulization BID Rigoberto Noel, MD   0.5 mg at 05/12/21 0849  ? busPIRone (BUSPAR) tablet 10 mg  10 mg Per Tube TID Olalere, Adewale A, MD   10 mg at 05/12/21 1016  ? chlorhexidine (PERIDEX) 0.12 % solution 15 mL  15 mL Mouth Rinse BID Simonne Maffucci B, MD   15 mL at 05/12/21 1016  ? Chlorhexidine Gluconate Cloth 2 % PADS 6 each  6 each Topical Daily Candee Furbish, MD   6 each at 05/11/21 2205  ? feeding supplement (OSMOLITE 1.2 CAL) liquid 1,000 mL  1,000 mL Per Tube Continuous Juanito Doom, MD 60 mL/hr at 05/12/21 0800 Infusion Verify at 05/12/21 0800  ? feeding supplement (PROSource TF) liquid 45 mL  45 mL Per Tube BID Simonne Maffucci B, MD   45 mL at 05/12/21 1015  ? gabapentin (NEURONTIN) 250 MG/5ML solution 300 mg  300 mg Per Tube Q12H Simonne Maffucci B, MD   300 mg at 05/12/21 1016  ? glycopyrrolate (ROBINUL) tablet 1 mg  1 mg Per Tube BID Simonne Maffucci B, MD   1 mg at 05/12/21 1015  ? guaiFENesin-codeine 100-10 MG/5ML solution 5 mL  5 mL Per Tube Q6H Millen, Jessica B, RPH   5 mL at 05/12/21 1016  ? heparin injection 5,000 Units  5,000 Units Subcutaneous Q8H Candee Furbish, MD   5,000 Units at 05/12/21 0500  ? insulin aspart (novoLOG) injection 0-15 Units  0-15 Units Subcutaneous Q4H Idamae Schuller, MD   2 Units at 05/12/21 1243  ? ipratropium-albuterol (DUONEB) 0.5-2.5 (3) MG/3ML nebulizer solution 3 mL  3 mL Nebulization BID Olalere, Adewale A, MD   3 mL at 05/12/21 0848  ? levETIRAcetam (KEPPRA) 100 MG/ML solution 500 mg  500 mg Per Tube BID Simonne Maffucci B, MD   500 mg at 05/12/21 1015  ? MEDLINE mouth rinse  15 mL Mouth Rinse q12n4p Simonne Maffucci B, MD   15 mL at 05/12/21 1245  ? melatonin tablet 5 mg  5 mg Per Tube QHS Olalere, Adewale A, MD   5 mg at 05/11/21 2004  ? menthol-cetylpyridinium (CEPACOL) lozenge 3 mg  1 lozenge Oral Q6H PRN Idamae Schuller, MD      ? multivitamin with minerals tablet 1 tablet  1 tablet Per Tube Daily Juanito Doom, MD   1 tablet at 05/12/21 1015  ? naloxone [facility]) injection 0.4 mg  0.4 mg Intravenous PRN Olalere, Adewale A, MD   0.4 mg at 05/04/21 1956  ? ondansetron (ZOFRAN) injection 4 mg  4 mg Intravenous Q6H PRN Candee Furbish, MD   4 mg at 05/06/21 2042  ? oxyCODONE (Oxy IR/ROXICODONE) immediate release tablet 5 mg  5 mg Per Tube Q6H PRN Olalere, Adewale A, MD   5 mg at 05/09/21 1111  ? QUEtiapine  (SEROQUEL) tablet 25 mg  25 mg Oral q morning Olalere, Adewale A, MD   25 mg at 05/12/21 1015  ? QUEtiapine (SEROQUEL) tablet 50 mg  50 mg Per Tube QHS Olalere, Adewale A, MD   50 mg at 05/11/21 2112  ? sodium chloride flush (NS) 0.9 % injection 10 mL  10 mL Other Q8H Erick Colace, NP   10 mL at 05/12/21 1015  ? sodium chloride flush (NS) 0.9 %  injection 10-40 mL  10-40 mL Intracatheter Q12H Candee Furbish, MD   10 mL at 05/12/21 1012  ? sodium chloride flush (NS) 0.9 % injection 10-40 mL  10-40 mL Intracatheter PRN Candee Furbish, MD   3 mL at 05/06/21 2400  ? white petrolatum (VASELINE) gel   Topical PRN Olalere, Adewale A, MD      ? ? ? ?Discharge Medications: ?Please see discharge summary for a list of discharge medications. ? ?Relevant Imaging Results: ? ?Relevant Lab Results: ? ? ?Additional Information ?SSN: [id number]. ? ?Joanne Chars, LCSW ? ? ? ? ?

## 2021-05-12 NOTE — Progress Notes (Signed)
Speech Language Pathology Treatment: Dysphagia  ?Patient Details ?Name: [name] ?MRN: [id number] ?DOB: 1960/07/18 ?Today's Date: 05/12/2021 ?Time: 1340-1400 ?SLP Time Calculation (min) (ACUTE ONLY): 20 min ? ?Assessment / Plan / Recommendation ?Clinical Impression ? Patient seen by SLP for skilled session focused on dysphagia goals. Patient as awake and alert in bed, still with some confusion telling SLP "They're letting me go home today". Per RN she was informed that patient will be discharging from ICU today and so that appears to be the confusion for patient. SLP helped patient reposition in bed and then set up cup of water with straw and after verbal cues for patient to initiate taking sips, she was able to pick up cup and give self sips. She exhibited one mild delayed cough response but no change in vitals or vocal quality. SLP discussed modified barium swallow test with patient and she verbalized that she is ready to particiapte in this. MBS will be scheduled for next date as today radiology department does not have any available time slots.  ? ?  ?HPI HPI: Patient is a 61 y.o. female with PMH: MS, ANEMIA, GERD, HTN, IBS, seizures. She presented to the hospital c/o weakness and leg pain for 3 weeks. In the ER she was hypotensive, tachycardic and required vasopressors. She recieved 5/2 L fluid overnight and was hyperkalemic, in renal failure and had leukocytosis. CXR in ER showed pneumothorax and chest tube was placed. She is being treated for Septic shock due to cellulitis in setting of immunocompromised state improved. SLP swallow eval ordered due to RN concern for patient's ability to swallow PO's. Patient had a second chest tube placed 4/25 and later that evening code stroke was called; neurology did not see neurological focality. On 4/27 a second, large bore chest tube was replaced. Chest tubes removed 5/2. ?  ?   ?SLP Plan ? Continue with current plan of care ? ?  ?  ?Recommendations for follow up  therapy are one component of a multi-disciplinary discharge planning process, led by the attending physician.  Recommendations may be updated based on patient status, additional functional criteria and insurance authorization. ?  ? ?Recommendations  ?Diet recommendations: Thin liquid ?Medication Administration: Via alternative means ?Supervision: Staff to assist with self feeding;Patient able to self feed;Full supervision/cueing for compensatory strategies ?Compensations: Slow rate;Small sips/bites;Minimize environmental distractions ?Postural Changes and/or Swallow Maneuvers: Seated upright 90 degrees;Upright 30-60 min after meal  ?   ?    ?   ? ? ? ? Follow Up Recommendations: Other (comment) ?Assistance recommended at discharge: Frequent or constant Supervision/Assistance ?SLP Visit Diagnosis: Dysphagia, unspecified (R13.10) ?Plan: Continue with current plan of care ? ? ? ? ?  ?Angela Nevin, MA, CCC-SLP ?Speech Therapy ? ?

## 2021-05-13 ENCOUNTER — Inpatient Hospital Stay (HOSPITAL_COMMUNITY): Payer: Medicare Other

## 2021-05-13 DIAGNOSIS — R579 Shock, unspecified: Secondary | ICD-10-CM | POA: Diagnosis not present

## 2021-05-13 LAB — CBC
HCT: 28.9 % — ABNORMAL LOW (ref 36.0–46.0)
Hemoglobin: 9.1 g/dL — ABNORMAL LOW (ref 12.0–15.0)
MCH: 28.3 pg (ref 26.0–34.0)
MCHC: 31.5 g/dL (ref 30.0–36.0)
MCV: 90 fL (ref 80.0–100.0)
Platelets: 346 10*3/uL (ref 150–400)
RBC: 3.21 MIL/uL — ABNORMAL LOW (ref 3.87–5.11)
RDW: 16.7 % — ABNORMAL HIGH (ref 11.5–15.5)
WBC: 6 10*3/uL (ref 4.0–10.5)
nRBC: 0.7 % — ABNORMAL HIGH (ref 0.0–0.2)

## 2021-05-13 LAB — BASIC METABOLIC PANEL
Anion gap: 11 (ref 5–15)
BUN: 28 mg/dL — ABNORMAL HIGH (ref 8–23)
CO2: 22 mmol/L (ref 22–32)
Calcium: 9 mg/dL (ref 8.9–10.3)
Chloride: 105 mmol/L (ref 98–111)
Creatinine, Ser: 0.57 mg/dL (ref 0.44–1.00)
GFR, Estimated: >60 mL/min (ref >60)
Glucose, Bld: 140 mg/dL — ABNORMAL HIGH (ref 70–99)
Potassium: 4.4 mmol/L (ref 3.5–5.1)
Sodium: 138 mmol/L (ref 135–145)

## 2021-05-13 LAB — GLUCOSE, CAPILLARY
Glucose-Capillary: 129 mg/dL — ABNORMAL HIGH (ref 70–99)
Glucose-Capillary: 101 mg/dL — ABNORMAL HIGH (ref 70–99)
Glucose-Capillary: 128 mg/dL — ABNORMAL HIGH (ref 70–99)
Glucose-Capillary: 92 mg/dL (ref 70–99)
Glucose-Capillary: 98 mg/dL (ref 70–99)
Glucose-Capillary: 97 mg/dL (ref 70–99)

## 2021-05-13 MED ORDER — BUSPIRONE HCL 10 MG PO TABS
10.0000 mg | ORAL_TABLET | Freq: Three times a day (TID) | ORAL | Status: DC
  Filled 2021-05-13 (×2): qty 1

## 2021-05-13 MED ORDER — GLYCOPYRROLATE 1 MG PO TABS
1.0000 mg | ORAL_TABLET | Freq: Two times a day (BID) | ORAL | Status: DC
  Administered 2021-05-13 – 2021-05-14 (×3): 1 mg via ORAL
  Filled 2021-05-13 (×4): qty 1

## 2021-05-13 MED ORDER — QUETIAPINE FUMARATE 50 MG PO TABS: 50.0000 mg | ORAL_TABLET | Freq: Every day | ORAL | Status: DC

## 2021-05-13 MED ORDER — ADULT MULTIVITAMIN W/MINERALS CH
1.0000 | ORAL_TABLET | Freq: Every day | ORAL | Status: DC
  Administered 2021-05-14 – 2021-05-18 (×5): 1 via ORAL
  Filled 2021-05-13 (×5): qty 1

## 2021-05-13 MED ORDER — MELATONIN 5 MG PO TABS
5.0000 mg | ORAL_TABLET | Freq: Every day | ORAL | Status: DC
  Administered 2021-05-13 – 2021-05-17 (×5): 5 mg via ORAL
  Filled 2021-05-13 (×5): qty 1

## 2021-05-13 MED ORDER — GUAIFENESIN-CODEINE 100-10 MG/5ML PO SOLN
5.0000 mL | Freq: Four times a day (QID) | ORAL | Status: DC
  Administered 2021-05-13 – 2021-05-16 (×10): 5 mL via ORAL
  Filled 2021-05-13 (×10): qty 5

## 2021-05-13 MED ORDER — LEVETIRACETAM 100 MG/ML PO SOLN
500.0000 mg | Freq: Two times a day (BID) | ORAL | Status: DC
  Administered 2021-05-13 – 2021-05-18 (×10): 500 mg via ORAL
  Filled 2021-05-13 (×11): qty 5

## 2021-05-13 MED ORDER — OXYCODONE HCL 5 MG PO TABS
5.0000 mg | ORAL_TABLET | Freq: Four times a day (QID) | ORAL | Status: DC | PRN
  Administered 2021-05-13 – 2021-05-17 (×4): 5 mg via ORAL
  Filled 2021-05-13 (×4): qty 1

## 2021-05-13 MED ORDER — GABAPENTIN 250 MG/5ML PO SOLN
300.0000 mg | Freq: Two times a day (BID) | ORAL | Status: DC
  Administered 2021-05-13 – 2021-05-18 (×10): 300 mg via ORAL
  Filled 2021-05-13 (×13): qty 6

## 2021-05-13 NOTE — Progress Notes (Signed)
Modified Barium Swallow Progress Note ? ?Patient Details  ?Name: [name] ?MRN: [id number] ?Date of Birth: 02/23/1960 ? ?Today's Date: 05/13/2021 ? ?Modified Barium Swallow completed.  Full report located under Chart Review in the Imaging Section. ? ?Brief recommendations include the following: ? ?Clinical Impression ? Pt demosntrates a mild oropahrygneal dyspahgia related to decreased respiratory endurance and inability to sustain prolonged airway closure. Pt is easily fatigued and short of breath. There are instances of normal timely swallowing with adequate airway protection with thin liquids. But there are also instances of brief oral holding while panting with premature spillage and pooling of liquid in the pyriforms that is penetrated to the vocal folds during the swallow PAS 3/4/5. Pt did have one throat clear, but also later let penetrate sit on vocal folds without response. Quantity was so trace that it never passed to the trachea. Pt also had some mild vallecular residuals with solids, which is likely more related to dry mucosa than overt weakness. A liquid wash was helpful. Pt can continue single sips of thin liquids and also advance to soft solids with intermittent throat clearing encouraged. An esophageal sweep did show a barium tablet at the distal esophagus that did not pass with two liquid washes. Pt had frequent belching during study. If pt does not tolerate solids a GI consult may be warranted. Will f/u for tolerance. ?  ?Swallow Evaluation Recommendations ? ? Recommended Consults: Consider GI evaluation ? ? SLP Diet Recommendations: Dysphagia 3 (Mech soft) solids;Thin liquid ? ? Liquid Administration via: Cup;Straw ? ? Medication Administration: Whole meds with puree ? ? Supervision: Patient able to self feed ? ? Compensations: Slow rate;Small sips/bites;Clear throat intermittently;Follow solids with liquid ? ? Postural Changes: Remain semi-upright after after feeds/meals (Comment);Seated  upright at 90 degrees ? ? Oral Care Recommendations: Oral care BID ? ?   ? ? ?Harlon Ditty, MA CCC-SLP  ?Acute Rehabilitation Services ?Secure Chat Preferred ?Office [phone] ? ?Carrine Kroboth, Riley Nearing ?05/13/2021,10:49 AM ?

## 2021-05-13 NOTE — Progress Notes (Signed)
Physical Therapy Treatment ?Patient Details ?Name: [name] ?MRN: [id number] ?DOB: [birth year] ?Today's Date: 05/13/2021 ? ? ?History of Present Illness Pt adm 4/17 with septic shock due to RLE cellulitis and PNA. Pt developed acute hypoxic respiratory failure and placed on bipap 4/19. Developed iatrogenic pneumothorax and chest tube placed. PMH - Multiple Sclerosis, seizure, ankylosing spondylitis, fibromyalgia ? ?  ?PT Comments  ? ? Focus of session today was bed mobility, sitting balance, functional transfers, and self care. The patient tolerated well but continues to be nervous with movement and anxious during the sessions. Her anxiety casuses her to have decreased saftey awareness and require increased assist and cuing during mobility.  Pt. Shows overall improvement with her bed mobility and balance. She still requires up to mod A for bed mobility, max A for transfers, and at points total A due to poor eccentric control during transfers. She is min A for sitting balance but remains impulsive to prevent any LOB to the floor. When sitting she adopts a significant posterior lean due to fear of falling, limited LE on the floor, a decreased ability to correct any LOB. Strength, functional mobility, cognition are still limiting function. Pt. Would benefit from skilled PT to continue to address her bed mobility, transfer ability, strength, and balance. Discharge plan needs to be updated to SNF due to pt's continued improvement in functional status. Pt to follow acutely as appropriate.  ?   ?Recommendations for follow up therapy are one component of a multi-disciplinary discharge planning process, led by the attending physician.  Recommendations may be updated based on patient status, additional functional criteria and insurance authorization. ? ?Follow Up Recommendations ? Skilled nursing-short term rehab (<3 hours/day) ?  ?  ?Assistance Recommended at Discharge Frequent or constant Supervision/Assistance  ?Patient  can return home with the following Two people to help with walking and/or transfers;Two people to help with bathing/dressing/bathroom;Assistance with cooking/housework;Assist for transportation;Help with stairs or ramp for entrance ?  ?Equipment Recommendations ? Hospital bed;Wheelchair (measurements PT);Wheelchair cushion (measurements PT)  ?  ?   ?Precautions / Restrictions Precautions ?Precautions: Fall;Other (comment) ?Restrictions ?Weight Bearing Restrictions: No  ?  ? ?Mobility ? Bed Mobility ?Overal bed mobility: Needs Assistance ?Bed Mobility: Supine to Sit, Sit to Supine ?  ?  ?Supine to sit: Mod assist ?Sit to supine: Max assist ?  ?General bed mobility comments: Pt performs the majority of LE movements and initiates trunk. Requires assist for full sit up. When returning to bed pt started to slide down the EOB, she was unable to correct, and required total assist to scoot back in bed. Sit to supine max A for LE and trunk management. ?  ? ?Transfers ?Overall transfer level: Needs assistance ?  ?Transfers: Bed to chair/wheelchair/BSC ?  ?Stand pivot transfers: Max assist ?  ?  ?  ?  ?General transfer comment: Max A for stand pivot for boost to stand, steady, and for significant sequecning cues during the transfer. ?  ? ? ? ? ?  ?Balance Overall balance assessment: Needs assistance ?Sitting-balance support: Feet supported ?Sitting balance-Leahy Scale: Fair ?Sitting balance - Comments: Pt shows improved sitting tolerance requiring min A at times to maintain trunk support. ?Postural control: Posterior lean ?  ?Standing balance-Leahy Scale: Poor ?Standing balance comment: Requires max support to maintain standing ?  ?  ?  ?  ?  ?  ?  ?  ?  ?  ?  ?  ? ?  ?Cognition Arousal/Alertness: Awake/alert ?Behavior During Therapy:  Anxious, Impulsive ?Overall Cognitive Status: Impaired/Different from baseline ?Area of Impairment: Attention, Safety/judgement, Awareness, Problem solving ?  ?  ?  ?  ?  ?  ?  ?  ?  ?Current  Attention Level: Selective ?  ?  ?Safety/Judgement: Decreased awareness of safety, Decreased awareness of deficits ?Awareness: Emergent ?Problem Solving: Slow processing, Decreased initiation ?General Comments: Still anxious with new movements, requires heavy verbal and tactile cuing. Further decreased saftey awareness as anxiety increases ?  ?  ? ?  ?   ?General Comments General comments (skin integrity, edema, etc.): VSS on RA, NT present to replace purwick post session ?  ?  ? ?Pertinent Vitals/Pain Pain Assessment ?Pain Assessment: Faces ?Faces Pain Scale: Hurts a little bit ?Pain Location: L quad ?Pain Descriptors / Indicators: Grimacing, Cramping ?Pain Intervention(s): Limited activity within patient's tolerance, Repositioned  ? ? ?   ?   ? ?PT Goals (current goals can now be found in the care plan section) Acute Rehab PT Goals ?Patient Stated Goal: To be able to get around at home like she was ?PT Goal Formulation: With patient ?Time For Goal Achievement: 05/17/21 ?Potential to Achieve Goals: Fair ?Progress towards PT goals: Progressing toward goals ? ?  ?Frequency ? ? ? Min 3X/week ? ? ? ?  ?PT Plan Discharge plan needs to be updated  ? ? ?   ?AM-PAC PT "6 Clicks" Mobility   ?Outcome Measure ? Help needed turning from your back to your side while in a flat bed without using bedrails?: A Little ?Help needed moving from lying on your back to sitting on the side of a flat bed without using bedrails?: A Lot ?Help needed moving to and from a bed to a chair (including a wheelchair)?: A Lot ?Help needed standing up from a chair using your arms (e.g., wheelchair or bedside chair)?: A Lot ?Help needed to walk in hospital room?: Total ?Help needed climbing 3-5 steps with a railing? : Total ?6 Click Score: 11 ? ?  ?End of Session Equipment Utilized During Treatment: Gait belt ?Activity Tolerance: Patient limited by fatigue ?Patient left: with call bell/phone within reach;in bed;with nursing/sitter in room;with chair  alarm set ?Nurse Communication: Mobility status ?PT Visit Diagnosis: Other abnormalities of gait and mobility (R26.89);Muscle weakness (generalized) (M62.81);Pain ?Pain - Right/Left: Left ?Pain - part of body: Leg ?  ? ? ?Time: 1131-1200 ?PT Time Calculation (min) (ACUTE ONLY): 29 min ? ?Charges:  $Therapeutic Activity: 8-22 mins ?$Self Care/Home Management: 8-22          ?          ? ?Lorie Apley, SPT ?Acute Rehab Services ? ? ? ?Lorie Apley ?05/13/2021, 3:07 PM ? ?

## 2021-05-13 NOTE — Progress Notes (Signed)
?PROGRESS NOTE ? ?[name]  YPP:[id number] DOB: January 05, 1961 DOA: 04/26/2021 ?PCP: [facility] [place]  ? ?Brief Narrative: ? ?Patient is a 23 female with history of multiple sclerosis ambulatory with walker, ankylosing spondylitis, GERD, seizure disorder, fibromyalgia who presented here with complaints of increased right lower extremity swelling, scaling, progressive weakness, poor oral intake.  On vent since she was hypertensive, tachycardic, encephalopathic.  Lab work showed elevated leukocytes, elevated creatinine in the range of 7, hypokalemia.  Patient was admitted under PCCM service for management of septic shock secondary to cellulitis of right lower extremity, started on broad-spectrum antibiotics.  Hospital course remarkable for hypoxia.  Imaging showed bilateral pneumothorax, status post chest tube placement and removal.  She was also treated for aspiration pneumonia during this hospitalization.  PT/OT recommending SNF on discharge.  Patient transferred to our service on 5/4.Plan for removal of feeding tube today. ? ? ?Assessment & Plan: ? ?Principal Problem: ?  Shock ([place]) ?Active Problems: ?  Anemia ?  GERD (gastroesophageal reflux disease) ?  HLA B27 (HLA B27 positive) ?  Seizures ([place]) ?  Pulmonary nodule ?  MS (multiple sclerosis) ([place]) ?  AKI (acute kidney injury) ([place]) ?  Septic shock ([place]) ?  Cellulitis of both lower extremities ?  High anion gap metabolic acidosis ?  Hyperkalemia ?  Acute metabolic encephalopathy ?  Pneumothorax on right ?  Pressure injury of skin ?  Acute respiratory failure with hypoxia ([place]) ? ? ?Septic shock secondary to right lower extremity cellulitis: Presented with weakness, cellulitis of right lower extremity.  Found to be hypotensive on presentation, with leukocytosis, AKI, hyperkalemia, encephalopathy, hypoxic.  Initially required vasopressors.  Sepsis physiology has resolved.  Finished  antibiotics for aspiration pneumonia. ? ?Bilateral pneumothorax: Due  to RIJ central line complication , pigtail failed to fix it and then she got an intraparenchymal large bore chest tube.Status post bilateral chest tubes placement ,now removed.  CT surgery was following.    On room air today ? ?AKI/hyperkalemia: Presented with severe AKI.  Kidney function has normalized.  Nephrology was  following. ? ?History of COPD/tobacco use: Currently not in exacerbation.  Follows with Dr. Melvyn Novas.  She also has history of left upper lobe cavitary lesion.  Continue bronchodilators, nicotine patch.  We recommend to follow-up with pulmonology as an outpatient. ? ?Acute metabolic encephalopathy/delirium: Hospitalist remarkable for delirium.  She was started  on Seroquel, BuSpar .  We will hold these medications now because she is currently alert and oriented.  Continue gabapentin that she takes at home. ? ?Multiple sclerosis/ankylosing spondylitis: At baseline, she is able to walk with the help of walker. On ocrelizumab for MS as outpatient therapy.Follows with rheumatology.  Presented with weakness.  Currently deconditioned.  PT/OT recommending LTAC on DC. ? ?Dysphagia: Minimal intake during this hospitalization so tube feeding was started.   Speech therapy closely following.  Recommended dysphagia 3 diet today after MBS so tube will be removed.If she has persistant signs of dysphagia or aspiration, will request for GI consultation. ? ?Normocytic anemia: Most likely secondary to acute illness.  Currently hemoglobin stable in the range of 8-9.  Continue to monitor ? ?Hyperglycemia: Hemoglobin A1c of 5.  She was started on sliding scale insulin for hyperglycemia.  Currently stable ? ?Stage II sacral ulcer/chronic venous stasis changes/superficial lower extremity ulcers: Continue protective foam pads, continue pressure offloading.  Continue wound care ? ?Debility/deconditioning: PT/OT recommending skilled nursing facility on discharge.She lives with her family, ambulates with the help of walker.  Patient is more interested on home health but she will think about it. ? ? ? ? ? ?  ? ? ?Nutrition Problem: Increased nutrient needs ?Etiology: acute illness, wound healing ?Pressure Injury 05/07/21 Sacrum Mid Stage 2 -  Partial thickness loss of dermis presenting as a shallow open injury with a red, pink wound bed without slough. (Active)  ?05/07/21 2000  ?Location: Sacrum  ?Location Orientation: Mid  ?Staging: Stage 2 -  Partial thickness loss of dermis presenting as a shallow open injury with a red, pink wound bed without slough.  ?Wound Description (Comments):   ?Present on Admission: No  ?Dressing Type Foam - Lift dressing to assess site every shift;Moisture barrier 05/13/21 0300  ? ? ?DVT prophylaxis:heparin injection 5,000 Units Start: 04/26/21 1400 ? ? ?  Code Status: Full Code ? ?Family Communication: None at bedside ? ?Patient status:Inpatient ? ?Patient is from :Home ? ?Anticipated discharge to: SNF vs HH ? ?Estimated DC date:1-2 days ? ? ?Consultants: PCCM, cardiothoracic surgery ? ?Procedures: Chest tube placement ? ?Antimicrobials:  ?Anti-infectives (From admission, onward)  ? ? Start     Dose/Rate Route Frequency Ordered Stop  ? 05/07/21 0900  Ampicillin-Sulbactam (UNASYN) 3 g in sodium chloride 0.9 % 100 mL IVPB       ? 3 g ?200 mL/hr over 30 Minutes Intravenous Every 8 hours 05/07/21 0816 05/12/21 0135  ? 05/05/21 1015  fluconazole (DIFLUCAN) tablet 150 mg       ? 150 mg Per Tube  Once 05/05/21 0919 05/05/21 1015  ? 04/29/21 1400  cefTRIAXone (ROCEPHIN) 2 g in sodium chloride 0.9 % 100 mL IVPB       ? 2 g ?200 mL/hr over 30 Minutes Intravenous Every 24 hours 04/29/21 0831 05/05/21 1420  ? 04/28/21 0715  vancomycin (VANCOREADY) IVPB 750 mg/150 mL  Status:  Discontinued       ? 750 mg ?150 mL/hr over 60 Minutes Intravenous Every 24 hours 04/28/21 0620 04/28/21 0817  ? 04/27/21 2200  piperacillin-tazobactam (ZOSYN) IVPB 3.375 g  Status:  Discontinued       ? 3.375 g ?12.5 mL/hr over 240 Minutes  Intravenous Every 8 hours 04/27/21 2021 04/29/21 0831  ? 04/27/21 1100  piperacillin-tazobactam (ZOSYN) IVPB 3.375 g  Status:  Discontinued       ? 3.375 g ?12.5 mL/hr over 240 Minutes Intravenous Every 12 hours 04/26/21 1305 04/27/21 2021  ? 04/27/21 1000  vancomycin variable dose per unstable renal function (pharmacist dosing)  Status:  Discontinued       ?  Does not apply See admin instructions 04/26/21 1305 04/28/21 0817  ? 04/26/21 1315  vancomycin (VANCOREADY) IVPB 500 mg/100 mL       ? 500 mg ?100 mL/hr over 60 Minutes Intravenous NOW 04/26/21 1305 04/26/21 1801  ? 04/26/21 1100  vancomycin (VANCOCIN) IVPB 1000 mg/200 mL premix       ? 1,000 mg ?200 mL/hr over 60 Minutes Intravenous  Once 04/26/21 1056 04/26/21 1423  ? 04/26/21 1100  ceFEPIme (MAXIPIME) 2 g in sodium chloride 0.9 % 100 mL IVPB       ? 2 g ?200 mL/hr over 30 Minutes Intravenous  Once 04/26/21 1056 04/26/21 1143  ? ?  ? ? ?Subjective: ?Patient seen and examined at the bedside this afternoon.  Remains hemodynamically stable during my evaluation.  Alert and oriented, lying in bed.  Eating the lunch.  She feels better.  Denies any new complaints. ? ?Objective: ?Vitals:  ? 05/13/21 0400  05/13/21 0500 05/13/21 0600 05/13/21 0700  ?BP: (!) 91/57 106/64 117/63 110/61  ?Pulse: 92 (!) 110 (!) 113 (!) 104  ?Resp: 16 19 (!) 21 18  ?Temp:      ?TempSrc:      ?SpO2: 98% 96% 95% 96%  ?Weight:      ?Height:      ? ? ?Intake/Output Summary (Last 24 hours) at 05/13/2021 0726 ?Last data filed at 05/13/2021 0700 ?Gross per 24 hour  ?Intake 3052.45 ml  ?Output 1660 ml  ?Net 1392.45 ml  ? ?Filed Weights  ? 05/11/21 0500 05/12/21 0445 05/13/21 0346  ?Weight: 62.7 kg 61.2 kg 61.4 kg  ? ? ?Examination: ? ?General exam: Overall comfortable, not in distress, very deconditioned, weak ?HEENT: PERRL, feeding tube ?Respiratory system:  no wheezes or crackles  ?Cardiovascular system: S1 & S2 heard, RRR.  ?Gastrointestinal system: Abdomen is nondistended, soft and  nontender. ?Central nervous system: Alert and oriented ?Extremities: No edema, no clubbing ,no cyanosis, chronic venous stasis changes on bilateral lower extremities with erythematous changes/wrapped with dressings ?Skin:no ic

## 2021-05-14 DIAGNOSIS — R5381 Other malaise: Secondary | ICD-10-CM

## 2021-05-14 DIAGNOSIS — G35 Multiple sclerosis: Secondary | ICD-10-CM

## 2021-05-14 DIAGNOSIS — L03115 Cellulitis of right lower limb: Secondary | ICD-10-CM

## 2021-05-14 DIAGNOSIS — R1310 Dysphagia, unspecified: Secondary | ICD-10-CM

## 2021-05-14 DIAGNOSIS — L03116 Cellulitis of left lower limb: Secondary | ICD-10-CM

## 2021-05-14 DIAGNOSIS — N179 Acute kidney failure, unspecified: Secondary | ICD-10-CM | POA: Diagnosis not present

## 2021-05-14 DIAGNOSIS — J9601 Acute respiratory failure with hypoxia: Secondary | ICD-10-CM | POA: Diagnosis not present

## 2021-05-14 DIAGNOSIS — E8729 Other acidosis: Secondary | ICD-10-CM

## 2021-05-14 DIAGNOSIS — R1314 Dysphagia, pharyngoesophageal phase: Secondary | ICD-10-CM

## 2021-05-14 DIAGNOSIS — J939 Pneumothorax, unspecified: Secondary | ICD-10-CM | POA: Diagnosis not present

## 2021-05-14 DIAGNOSIS — A419 Sepsis, unspecified organism: Secondary | ICD-10-CM | POA: Diagnosis not present

## 2021-05-14 DIAGNOSIS — K219 Gastro-esophageal reflux disease without esophagitis: Secondary | ICD-10-CM

## 2021-05-14 LAB — GLUCOSE, CAPILLARY
Glucose-Capillary: 107 mg/dL — ABNORMAL HIGH (ref 70–99)
Glucose-Capillary: 83 mg/dL (ref 70–99)
Glucose-Capillary: 84 mg/dL (ref 70–99)
Glucose-Capillary: 90 mg/dL (ref 70–99)
Glucose-Capillary: 91 mg/dL (ref 70–99)
Glucose-Capillary: 95 mg/dL (ref 70–99)

## 2021-05-14 NOTE — Assessment & Plan Note (Addendum)
Culture data as above.  Completed antibiotic course.  Sepsis physiology resolved.  On room air.  ?

## 2021-05-14 NOTE — Assessment & Plan Note (Signed)
Uses rolling walker at baseline. ?-Therapy recommended SNF. ?

## 2021-05-14 NOTE — TOC Progression Note (Signed)
Transition of Care (TOC) - Progression Note  ? ? ?Patient Details  ?Name: [name] ?MRN: [id number] ?Date of Birth: Dec 17, 1960 ? ?Transition of Care (TOC) CM/SW Contact  ?Marilu Favre, RN ?Phone Number: ?05/14/2021, 12:04 PM ? ?Clinical Narrative:    ? ?Spoke to [name] with [employer] . She has not heard back from appeal from [facility] , however, since chaest tube and feeding tube patient does not meet criteria for LTAC.  ? ?NCM explained above to patient. Patient voiced understanding.  ? ?Patient interested in SNF. SNF bed offers given ( [email or web address][email or web address] . Patient will review and discuss with her husband .TOC will follow up with patient on decision.  ? ?Expected Discharge Plan: [place] (LTAC) ?Barriers to Discharge: Other (must enter comment) (acceptance and insurance approval for LTAC) ? ?Expected Discharge Plan and Services ?Expected Discharge Plan: [place] (LTAC) ?In-house Referral: Clinical Social Work ?  ?Post Acute Care Choice: Long Term Acute Care (LTAC) ?Living arrangements for the past 2 months: [place] ?                ?  ?  ?  ?  ?  ?  ?  ?  ?  ?  ? ? ?Social Determinants of Health (SDOH) Interventions ?  ? ?Readmission Risk Interventions ?   ? View : No data to display.  ?  ?  ?  ? ? ?

## 2021-05-14 NOTE — Assessment & Plan Note (Signed)
H&H stable.  Monitor intermittently. ?

## 2021-05-14 NOTE — Hospital Course (Addendum)
61 year old F with PMH of MS ambulatory with walker, ankylosing spondylitis, seizure disorder, fibromyalgia and GERD presenting with RLE swelling and scaling, as well as progressive weakness and poor p.o. intake, and admitted to ICU with septic shock due to RLE cellulitis, AKI and acute metabolic encephalopathy in the setting of sepsis and uremia, and admitted to ICU requiring vasopressor and broad-spectrum antibiotics.  Hospital course complicated by acute respiratory failure with hypoxia in the setting of aspiration pneumonia and bilateral pneumothorax due to R IJ central line complication requiring mechanical ventilation and bilateral chest tube.  Eventually, she was extubated and weaned off vasopressors.  Chest tube removed.  She was transferred to [facility] hospitalist service on 5/4.  Therapy recommends SNF.  On dysphagia 3 diet per SLP. ? ?Medically optimized for discharge to SNF. ?

## 2021-05-14 NOTE — Assessment & Plan Note (Signed)
SLP recommends dysphagia 3 diet. ?

## 2021-05-14 NOTE — Assessment & Plan Note (Addendum)
Likely due to aspiration pneumonia and bilateral pneumothorax.  Resolved.  On room air. ?-Discontinue ICS/LABA ?-DuoNeb as needed ?-Encourage incentive spirometry ?

## 2021-05-14 NOTE — Assessment & Plan Note (Signed)
At baseline, ambulates using walker.  On ocrelizumab outpatient. ?-Outpatient follow-up ?

## 2021-05-14 NOTE — Assessment & Plan Note (Signed)
-   Continue home Keppra 

## 2021-05-14 NOTE — Assessment & Plan Note (Signed)
Multifactorial including severe sepsis and uremia.  Resolved. ?

## 2021-05-14 NOTE — Progress Notes (Signed)
?PROGRESS NOTE ? ?[name] [id number] DOB: Jan 05, 1961  ? ?PCP: [facility] ? ?Patient is from: Home.  Lives with family.  Uses rolling walker at baseline. ? ?DOA: 04/26/2021 LOS: 18 ? ?Chief complaints ?Chief Complaint  ?Patient presents with  ? Weakness  ?  ? ?Brief Narrative / Interim history: ?61 year old F with PMH of MS ambulatory with walker, ankylosing spondylitis, seizure disorder, fibromyalgia and GERD presenting with RLE swelling and scaling, as well as progressive weakness and poor p.o. intake, and admitted to ICU with septic shock due to RLE cellulitis, AKI and acute metabolic encephalopathy in the setting of sepsis and uremia, and admitted to ICU requiring vasopressor and broad-spectrum antibiotics.  Hospital course complicated by acute respiratory failure with hypoxia in the setting of aspiration pneumonia and bilateral pneumothorax due to R IJ central line complication requiring mechanical ventilation and bilateral chest tube.  Eventually, she was extubated and weaned off vasopressors.  Chest tube removed.  She was transferred to [facility] hospitalist service on 5/4.  Therapy recommends SNF.  On dysphagia 3 diet per SLP.  ? ?Subjective: ?Seen and examined earlier this morning.  No major events overnight of this morning.  No complaints other than left-sided chest pain with cough.  This pain is better.  She denies shortness of breath.  She denies nausea or vomiting. ? ?Objective: ?Vitals:  ? 05/14/21 0804 05/14/21 0805 05/14/21 0826 05/14/21 1626  ?BP:   107/64 113/72  ?Pulse:   92 100  ?Resp:   18 18  ?Temp:   97.8 ?F (36.6 ?C) 98.3 ?F (36.8 ?C)  ?TempSrc:   Oral Oral  ?SpO2: 95% 95% 98% 95%  ?Weight:      ?Height:      ? ? ?Examination: ? ?GENERAL: No apparent distress.  Nontoxic. ?HEENT: MMM.  Vision and hearing grossly intact.  ?NECK: Supple.  No apparent JVD.  ?RESP: 98% on RA.  No IWOB.  Fair aeration bilaterally. ?CVS:  RRR. Heart sounds normal.  ?ABD/GI/GU: BS+. Abd soft,  NTND.  ?MSK/EXT:  Moves extremities.  BLE weakness.  Dressing over BLE DCI. ?SKIN: Somewhat scaly skin in right leg and foot.  Dressing over BLE DCI. ?NEURO: Awake, alert and oriented appropriately.  No apparent focal neuro deficit. ?PSYCH: Calm. Normal affect.  ? ?Procedures:  ?Intubation ?Chest tube placement and removal ? ?Microbiology summarized: ?4/17-COVID-19 and influenza PCR nonreactive. ?4/17-MRSA PCR screen negative. ?4/17-urine culture with Gardnerella vaginalis ?4/17-blood culture NGTD ?4/21-urine culture with 40,000 colonies of mixed ? ?Assessment and Plan: ?* Septic shock due to RLE cellulitis in immunocompromised patient ?Culture data as above.  Completed antibiotic course.  Sepsis physiology resolved.  On room air.  ? ?Acute respiratory failure with hypoxia ([place]) ?Likely due to aspiration pneumonia and bilateral pneumothorax.  Resolved.  On room air. ?-Continue nebulizers. ?-Encourage incentive spirometry ? ?Bilateral pneumothorax ?Felt to be a complication from RIJ central line.  Pigtail failed to fix it.  Required intraparenchymal large bore chest tube, now removed.  Currently on room air.  CT surgery seems to have signed off. ? ?AKI (acute kidney injury) ([place]) ?Resolved. ? ?MS (multiple sclerosis) ([place]) ?At baseline, ambulates using walker.  On ocrelizumab outpatient. ?-Outpatient follow-up ? ?Dysphagia ?SLP recommends dysphagia 3 diet. ? ?Acute metabolic encephalopathy ?Multifactorial including severe sepsis and uremia.  Resolved. ? ?Physical deconditioning ?Uses rolling walker at baseline. ?-Therapy recommended SNF. ? ?Normocytic anemia ?H&H stable.  Monitor intermittently. ? ?Seizure disorder ([place]) ?Continue home [place]. ? ? ?Pressure skin injury: ?Pressure Injury  05/07/21 Sacrum Mid Stage 2 -  Partial thickness loss of dermis presenting as a shallow open injury with a red, pink wound bed without slough. (Active)  ?05/07/21 2000  ?Location: Sacrum  ?Location Orientation: Mid  ?Staging: Stage 2  -  Partial thickness loss of dermis presenting as a shallow open injury with a red, pink wound bed without slough.  ?Wound Description (Comments):   ?Present on Admission: No  ?Dressing Type Foam - Lift dressing to assess site every shift 05/14/21 0000  ? ?DVT prophylaxis:  ?heparin injection 5,000 Units Start: 04/26/21 1400 ? ?Code Status: Full code ?Family Communication: Patient and/or RN. Available if any question.  ?Level of care: Med-Surg ?Status is: Inpatient ?Remains inpatient appropriate because: Safe disposition/SNF ? ? ?Final disposition: SNF ?Consultants:  ?Pulmonology admitted patient. ?Cardiothoracic surgery ? ?Sch Meds:  ?Scheduled Meds: ? arformoterol  15 mcg Nebulization BID  ? budesonide (PULMICORT) nebulizer solution  0.5 mg Nebulization BID  ? chlorhexidine  15 mL Mouth Rinse BID  ? Chlorhexidine Gluconate Cloth  6 each Topical Daily  ? feeding supplement  237 mL Oral TID BM  ? gabapentin  300 mg Oral Q12H  ? glycopyrrolate  1 mg Oral BID  ? guaiFENesin-codeine  5 mL Oral Q6H  ? heparin  5,000 Units Subcutaneous Q8H  ? insulin aspart  0-15 Units Subcutaneous Q4H  ? ipratropium-albuterol  3 mL Nebulization BID  ? levETIRAcetam  500 mg Oral BID  ? mouth rinse  15 mL Mouth Rinse q12n4p  ? melatonin  5 mg Oral QHS  ? multivitamin with minerals  1 tablet Oral Daily  ? sodium chloride flush  10 mL Other Q8H  ? sodium chloride flush  10-40 mL Intracatheter Q12H  ? ?Continuous Infusions: ? sodium chloride Stopped (05/12/21 0954)  ? sodium chloride Stopped (05/06/21 0000)  ? ?PRN Meds:.sodium chloride, acetaminophen (TYLENOL) oral liquid 160 mg/5 mL, albuterol, menthol-cetylpyridinium, naLOXone (NARCAN)  injection, ondansetron (ZOFRAN) IV, oxyCODONE, sodium chloride flush, white petrolatum ? ?Antimicrobials: ?Anti-infectives (From admission, onward)  ? ? Start     Dose/Rate Route Frequency Ordered Stop  ? 05/07/21 0900  Ampicillin-Sulbactam (UNASYN) 3 g in sodium chloride 0.9 % 100 mL IVPB       ? 3 g ?200  mL/hr over 30 Minutes Intravenous Every 8 hours 05/07/21 0816 05/12/21 0135  ? 05/05/21 1015  fluconazole (DIFLUCAN) tablet 150 mg       ? 150 mg Per Tube  Once 05/05/21 0919 05/05/21 1015  ? 04/29/21 1400  cefTRIAXone (ROCEPHIN) 2 g in sodium chloride 0.9 % 100 mL IVPB       ? 2 g ?200 mL/hr over 30 Minutes Intravenous Every 24 hours 04/29/21 0831 05/05/21 1420  ? 04/28/21 0715  vancomycin (VANCOREADY) IVPB 750 mg/150 mL  Status:  Discontinued       ? 750 mg ?150 mL/hr over 60 Minutes Intravenous Every 24 hours 04/28/21 0620 04/28/21 0817  ? 04/27/21 2200  piperacillin-tazobactam (ZOSYN) IVPB 3.375 g  Status:  Discontinued       ? 3.375 g ?12.5 mL/hr over 240 Minutes Intravenous Every 8 hours 04/27/21 2021 04/29/21 0831  ? 04/27/21 1100  piperacillin-tazobactam (ZOSYN) IVPB 3.375 g  Status:  Discontinued       ? 3.375 g ?12.5 mL/hr over 240 Minutes Intravenous Every 12 hours 04/26/21 1305 04/27/21 2021  ? 04/27/21 1000  vancomycin variable dose per unstable renal function (pharmacist dosing)  Status:  Discontinued       ?  Does not apply See admin instructions 04/26/21 1305 04/28/21 0817  ? 04/26/21 1315  vancomycin (VANCOREADY) IVPB 500 mg/100 mL       ? 500 mg ?100 mL/hr over 60 Minutes Intravenous NOW 04/26/21 1305 04/26/21 1801  ? 04/26/21 1100  vancomycin (VANCOCIN) IVPB 1000 mg/200 mL premix       ? 1,000 mg ?200 mL/hr over 60 Minutes Intravenous  Once 04/26/21 1056 04/26/21 1423  ? 04/26/21 1100  ceFEPIme (MAXIPIME) 2 g in sodium chloride 0.9 % 100 mL IVPB       ? 2 g ?200 mL/hr over 30 Minutes Intravenous  Once 04/26/21 1056 04/26/21 1143  ? ?  ? ? ? ?I have personally reviewed the following labs and images: ?CBC: ?Recent Labs  ?Lab 05/08/21 ?0406 05/09/21 ?0110 05/10/21 ?0102 05/11/21 ?1055 05/12/21 ?0139 05/13/21 ?0116  ?WBC 14.8* 10.6* 8.1 5.6 6.3 6.0  ?NEUTROABS 13.2* 9.4* 6.9 4.3  --   --   ?HGB 9.0* 8.5* 8.9* 9.9* 8.9* 9.1*  ?HCT 27.7* 26.2* 28.5* 31.5* 28.6* 28.9*  ?MCV 90.5 89.7 91.3 90.8 89.9 90.0   ?PLT 305 262 282 344 319 346  ? ?BMP &GFR ?Recent Labs  ?Lab 05/08/21 ?0406 05/09/21 ?0110 05/10/21 ?0102 05/11/21 ?1049 05/12/21 ?0139 05/13/21 ?0116  ?NA 140 139 138 140 140 138  ?K 4.7 4.5 4.6 4

## 2021-05-14 NOTE — Care Management Important Message (Signed)
Important Message ? ?Patient Details  ?Name: [name] ?MRN: [id number] ?Date of Birth: [birth year] ? ? ?Medicare Important Message Given:  Yes ? ? ? ? ?Zoiey Christy ?05/14/2021, 4:37 PM ?

## 2021-05-14 NOTE — Progress Notes (Signed)
Speech Language Pathology Treatment: Dysphagia  ?Patient Details ?Name: [name] ?MRN: [id number] ?DOB: [birth year] ?Today's Date: 05/14/2021 ?Time: [id number] ?SLP Time Calculation (min) (ACUTE ONLY): 22 min ? ?Assessment / Plan / Recommendation ?Clinical Impression ? Pt seen post MBS (5/4) for diet tolerance and training in compensatory strategies to increase safety with PO intake. Educated pt regarding results of MBS and trained in use of intermittent throat clear, small bites/sips and alternation of bites with sips to improve airway protection and swallow efficiency. Given min verbal, but intermittent cues, pt performed strategies with 80% accuracy with sips of thin liquids via straw and bites of mechanical soft solids. Across multiple trials pt reported x1 feeling liquids on her vocal cords and asked clinician if she should clear her throat. Encouraged continued intermittent throat clearing to reduce risk for aspiration per MBS recommendations. RN and pt report some continued difficulty with swallowing pills and a GI consult may be warranted. For now, continue current diet with pills whole in puree. Will f/u briefly for tolerance and training/carry-over in use of strategies.  ?  ?HPI HPI: Patient is a 61 y.o. female with PMH: MS, ANEMIA, GERD, HTN, IBS, seizures. She presented to the hospital c/o weakness and leg pain for 3 weeks. In the ER she was hypotensive, tachycardic and required vasopressors. She recieved 5/2 L fluid overnight and was hyperkalemic, in renal failure and had leukocytosis. CXR in ER showed pneumothorax and chest tube was placed. She is being treated for Septic shock due to cellulitis in setting of immunocompromised state improved. SLP swallow eval ordered due to RN concern for patient's ability to swallow PO's. Patient had a second chest tube placed 4/25 and later that evening code stroke was called; neurology did not see neurological focality. On 4/27 a second, large bore chest tube was  replaced. Chest tubes removed 5/2. ?  ?   ?SLP Plan ? Continue with current plan of care ? ?  ?  ?Recommendations for follow up therapy are one component of a multi-disciplinary discharge planning process, led by the attending physician.  Recommendations may be updated based on patient status, additional functional criteria and insurance authorization. ?  ? ?Recommendations  ?Diet recommendations: Dysphagia 3 (mechanical soft);Thin liquid ?Liquids provided via: Straw;Cup ?Medication Administration: Whole meds with puree ?Supervision: Intermittent supervision to cue for compensatory strategies;Staff to assist with self feeding ?Compensations: Slow rate;Small sips/bites;Clear throat intermittently;Follow solids with liquid ?Postural Changes and/or Swallow Maneuvers: Seated upright 90 degrees;Upright 30-60 min after meal  ?   ?    ?   ? ? ? ? Oral Care Recommendations: Oral care BID ?Follow Up Recommendations: Long-term institutional care without follow-up therapy ?Assistance recommended at discharge: Frequent or constant Supervision/Assistance ?SLP Visit Diagnosis: Dysphagia, unspecified (R13.10) ?Plan: Continue with current plan of care ? ? ? ? ?  ?  ?Nancy Dense, MA, CCC-SLP ?Acute Rehabilitation Services ?Office Number: [phone] ? ? ?[name] ? ?05/14/2021, 11:17 AM ?

## 2021-05-14 NOTE — Assessment & Plan Note (Addendum)
Felt to be a complication from RIJ central line.  Pigtail failed to fix it.  Required intraparenchymal large bore chest tube, now removed.  Currently on room air.  CT surgery seems to have signed off. ?

## 2021-05-14 NOTE — Assessment & Plan Note (Signed)
Resolved

## 2021-05-14 NOTE — Plan of Care (Signed)
  Problem: Health Behavior/Discharge Planning: Goal: Ability to manage health-related needs will improve Outcome: Progressing   

## 2021-05-15 DIAGNOSIS — J9601 Acute respiratory failure with hypoxia: Secondary | ICD-10-CM | POA: Diagnosis not present

## 2021-05-15 DIAGNOSIS — N179 Acute kidney failure, unspecified: Secondary | ICD-10-CM | POA: Diagnosis not present

## 2021-05-15 DIAGNOSIS — J939 Pneumothorax, unspecified: Secondary | ICD-10-CM | POA: Diagnosis not present

## 2021-05-15 DIAGNOSIS — A419 Sepsis, unspecified organism: Secondary | ICD-10-CM | POA: Diagnosis not present

## 2021-05-15 LAB — RENAL FUNCTION PANEL
Albumin: 2.8 g/dL — ABNORMAL LOW (ref 3.5–5.0)
Anion gap: 11 (ref 5–15)
BUN: 25 mg/dL — ABNORMAL HIGH (ref 8–23)
CO2: 22 mmol/L (ref 22–32)
Calcium: 9.3 mg/dL (ref 8.9–10.3)
Chloride: 103 mmol/L (ref 98–111)
Creatinine, Ser: 0.73 mg/dL (ref 0.44–1.00)
GFR, Estimated: >60 mL/min (ref >60)
Glucose, Bld: 88 mg/dL (ref 70–99)
Phosphorus: 5.9 mg/dL — ABNORMAL HIGH (ref 2.5–4.6)
Potassium: 4.2 mmol/L (ref 3.5–5.1)
Sodium: 136 mmol/L (ref 135–145)

## 2021-05-15 LAB — CBC
HCT: 31.1 % — ABNORMAL LOW (ref 36.0–46.0)
Hemoglobin: 9.6 g/dL — ABNORMAL LOW (ref 12.0–15.0)
MCH: 28.1 pg (ref 26.0–34.0)
MCHC: 30.9 g/dL (ref 30.0–36.0)
MCV: 90.9 fL (ref 80.0–100.0)
Platelets: 479 10*3/uL — ABNORMAL HIGH (ref 150–400)
RBC: 3.42 MIL/uL — ABNORMAL LOW (ref 3.87–5.11)
RDW: 17.3 % — ABNORMAL HIGH (ref 11.5–15.5)
WBC: 6.1 10*3/uL (ref 4.0–10.5)
nRBC: 0.3 % — ABNORMAL HIGH (ref 0.0–0.2)

## 2021-05-15 LAB — GLUCOSE, CAPILLARY
Glucose-Capillary: 109 mg/dL — ABNORMAL HIGH (ref 70–99)
Glucose-Capillary: 87 mg/dL (ref 70–99)
Glucose-Capillary: 88 mg/dL (ref 70–99)
Glucose-Capillary: 90 mg/dL (ref 70–99)
Glucose-Capillary: 93 mg/dL (ref 70–99)
Glucose-Capillary: 98 mg/dL (ref 70–99)

## 2021-05-15 LAB — MAGNESIUM: Magnesium: 2.2 mg/dL (ref 1.7–2.4)

## 2021-05-15 MED ORDER — GLYCOPYRROLATE 1 MG PO TABS
1.0000 mg | ORAL_TABLET | Freq: Two times a day (BID) | ORAL | Status: DC
  Administered 2021-05-15 – 2021-05-18 (×7): 1 mg via ORAL
  Filled 2021-05-15 (×9): qty 1

## 2021-05-15 NOTE — Progress Notes (Signed)
Provider paged to see if the order for blood sugar checks every 4 hrs should remain the same or if it's ok to d/c this order and possibly change to blood sugar check before meals and at bedtime. Waiting for new order  ?

## 2021-05-15 NOTE — Progress Notes (Signed)
?PROGRESS NOTE ? ?[name] [id number] DOB: [birth year]  ? ?[facility]P: [facility] ? ?Patient is from: Home.  Lives with family.  Uses rolling walker at baseline. ? ?DOA: 04/26/2021 LOS: 19 ? ?Chief complaints ?Chief Complaint  ?Patient presents with  ? Weakness  ?  ? ?Brief Narrative / Interim history: ?61 year old F with PMH of MS ambulatory with walker, ankylosing spondylitis, seizure disorder, fibromyalgia and GERD presenting with RLE swelling and scaling, as well as progressive weakness and poor p.o. intake, and admitted to ICU with septic shock due to RLE cellulitis, AKI and acute metabolic encephalopathy in the setting of sepsis and uremia, and admitted to ICU requiring vasopressor and broad-spectrum antibiotics.  Hospital course complicated by acute respiratory failure with hypoxia in the setting of aspiration pneumonia and bilateral pneumothorax due to R IJ central line complication requiring mechanical ventilation and bilateral chest tube.  Eventually, she was extubated and weaned off vasopressors.  Chest tube removed.  She was transferred to [facility] on 5/4.  Therapy recommends SNF.  On dysphagia 3 diet per SLP. ? ?Medically optimized for discharge to SNF.  ? ?Subjective: ?Seen and examined earlier this morning.  No major events overnight of this morning.  No complaints.  She denies shortness of breath.  Chest pain and cough improved. ? ?Objective: ?Vitals:  ? 05/14/21 2118 05/15/21 0409 05/15/21 0737 05/15/21 1631  ?BP:  106/64 (!) 98/59 103/60  ?Pulse:  100 93 86  ?Resp:  16 17 18   ?Temp:  97.9 ?F (36.6 ?C) 98 ?F (36.7 ?C) 98.1 ?F (36.7 ?C)  ?TempSrc:  Oral Oral Oral  ?SpO2: 94% 95% 98% 93%  ?Weight:      ?Height:      ? ? ?Examination: ? ?GENERAL: No apparent distress.  Nontoxic. ?HEENT: MMM.  Vision and hearing grossly intact.  ?NECK: Supple.  No apparent JVD.  ?RESP:  No IWOB.  Fair aeration bilaterally. ?CVS:  RRR. Heart sounds normal.  ?ABD/GI/GU: BS+. Abd soft,  NTND.  ?MSK/EXT:  Moves extremities.  BLE weakness.  Dressing over BLE DCI. ?SKIN: Somewhat scaly skin in right leg and foot distally. ?NEURO: Awake and alert. Oriented appropriately.  No apparent focal neuro deficit. ?PSYCH: Calm. Normal affect.  ? ?Procedures:  ?Intubation ?Chest tube placement and removal ? ?Microbiology summarized: ?4/17-COVID-19 and influenza PCR nonreactive. ?4/17-MRSA PCR screen negative. ?4/17-urine culture with Gardnerella vaginalis ?4/17-blood culture NGTD ?4/21-urine culture with 40,000 colonies of mixed ? ?Assessment and Plan: ?* Septic shock due to RLE cellulitis in immunocompromised patient ?Culture data as above.  Completed antibiotic course.  Sepsis physiology resolved.  On room air.  ? ?Acute respiratory failure with hypoxia ([place]) ?Likely due to aspiration pneumonia and bilateral pneumothorax.  Resolved.  On room air. ?-Continue nebulizers. ?-Encourage incentive spirometry ? ?Bilateral pneumothorax ?Felt to be a complication from RIJ central line.  Pigtail failed to fix it.  Required intraparenchymal large bore chest tube, now removed.  Currently on room air.  CT surgery seems to have signed off. ? ?AKI (acute kidney injury) ([place]) ?Resolved. ? ?MS (multiple sclerosis) ([place]) ?At baseline, ambulates using walker.  On ocrelizumab outpatient. ?-Outpatient follow-up ? ?Dysphagia ?SLP recommends dysphagia 3 diet. ? ?Acute metabolic encephalopathy ?Multifactorial including severe sepsis and uremia.  Resolved. ? ?Physical deconditioning ?Uses rolling walker at baseline. ?-Therapy recommended SNF. ? ?Normocytic anemia ?H&H stable.  Monitor intermittently. ? ?Seizure disorder ([place]) ?Continue home [place]. ? ? ?Pressure skin injury: ?Pressure Injury 05/07/21 Sacrum Mid Stage 2 -  Partial  thickness loss of dermis presenting as a shallow open injury with a red, pink wound bed without slough. (Active)  ?05/07/21 2000  ?Location: Sacrum  ?Location Orientation: Mid  ?Staging: Stage 2 -  Partial  thickness loss of dermis presenting as a shallow open injury with a red, pink wound bed without slough.  ?Wound Description (Comments):   ?Present on Admission: No  ?Dressing Type Foam - Lift dressing to assess site every shift 05/14/21 2230  ? ?DVT prophylaxis:  ?heparin injection 5,000 Units Start: 04/26/21 1400 ? ?Code Status: Full code ?Family Communication: Patient and/or RN. Available if any question.  ?Level of care: Med-Surg ?Status is: Inpatient ?Remains inpatient appropriate because: Safe disposition/SNF ? ? ?Final disposition: SNF ?Consultants:  ?Pulmonology admitted patient. ?Cardiothoracic surgery ? ?Sch Meds:  ?Scheduled Meds: ? arformoterol  15 mcg Nebulization BID  ? budesonide (PULMICORT) nebulizer solution  0.5 mg Nebulization BID  ? chlorhexidine  15 mL Mouth Rinse BID  ? Chlorhexidine Gluconate Cloth  6 each Topical Daily  ? feeding supplement  237 mL Oral TID BM  ? gabapentin  300 mg Oral Q12H  ? glycopyrrolate  1 mg Oral BID  ? guaiFENesin-codeine  5 mL Oral Q6H  ? heparin  5,000 Units Subcutaneous Q8H  ? insulin aspart  0-15 Units Subcutaneous Q4H  ? ipratropium-albuterol  3 mL Nebulization BID  ? levETIRAcetam  500 mg Oral BID  ? mouth rinse  15 mL Mouth Rinse q12n4p  ? melatonin  5 mg Oral QHS  ? multivitamin with minerals  1 tablet Oral Daily  ? sodium chloride flush  10-40 mL Intracatheter Q12H  ? ?Continuous Infusions: ? sodium chloride Stopped (05/12/21 0954)  ? sodium chloride Stopped (05/06/21 0000)  ? ?PRN Meds:.sodium chloride, acetaminophen (TYLENOL) oral liquid 160 mg/5 mL, albuterol, menthol-cetylpyridinium, naLOXone (NARCAN)  injection, ondansetron (ZOFRAN) IV, oxyCODONE, sodium chloride flush, white petrolatum ? ?Antimicrobials: ?Anti-infectives (From admission, onward)  ? ? Start     Dose/Rate Route Frequency Ordered Stop  ? 05/07/21 0900  Ampicillin-Sulbactam (UNASYN) 3 g in sodium chloride 0.9 % 100 mL IVPB       ? 3 g ?200 mL/hr over 30 Minutes Intravenous Every 8 hours  05/07/21 0816 05/12/21 0135  ? 05/05/21 1015  fluconazole (DIFLUCAN) tablet 150 mg       ? 150 mg Per Tube  Once 05/05/21 0919 05/05/21 1015  ? 04/29/21 1400  cefTRIAXone (ROCEPHIN) 2 g in sodium chloride 0.9 % 100 mL IVPB       ? 2 g ?200 mL/hr over 30 Minutes Intravenous Every 24 hours 04/29/21 0831 05/05/21 1420  ? 04/28/21 0715  vancomycin (VANCOREADY) IVPB 750 mg/150 mL  Status:  Discontinued       ? 750 mg ?150 mL/hr over 60 Minutes Intravenous Every 24 hours 04/28/21 0620 04/28/21 0817  ? 04/27/21 2200  piperacillin-tazobactam (ZOSYN) IVPB 3.375 g  Status:  Discontinued       ? 3.375 g ?12.5 mL/hr over 240 Minutes Intravenous Every 8 hours 04/27/21 2021 04/29/21 0831  ? 04/27/21 1100  piperacillin-tazobactam (ZOSYN) IVPB 3.375 g  Status:  Discontinued       ? 3.375 g ?12.5 mL/hr over 240 Minutes Intravenous Every 12 hours 04/26/21 1305 04/27/21 2021  ? 04/27/21 1000  vancomycin variable dose per unstable renal function (pharmacist dosing)  Status:  Discontinued       ?  Does not apply See admin instructions 04/26/21 1305 04/28/21 0817  ? 04/26/21 1315  vancomycin (VANCOREADY)  IVPB 500 mg/100 mL       ? 500 mg ?100 mL/hr over 60 Minutes Intravenous NOW 04/26/21 1305 04/26/21 1801  ? 04/26/21 1100  vancomycin (VANCOCIN) IVPB 1000 mg/200 mL premix       ? 1,000 mg ?200 mL/hr over 60 Minutes Intravenous  Once 04/26/21 1056 04/26/21 1423  ? 04/26/21 1100  ceFEPIme (MAXIPIME) 2 g in sodium chloride 0.9 % 100 mL IVPB       ? 2 g ?200 mL/hr over 30 Minutes Intravenous  Once 04/26/21 1056 04/26/21 1143  ? ?  ? ? ? ?I have personally reviewed the following labs and images: ?CBC: ?Recent Labs  ?Lab 05/09/21 ?0110 05/10/21 ?0102 05/11/21 ?1055 05/12/21 ?0139 05/13/21 ?0116 05/15/21 ?[id number]  ?WBC 10.6* 8.1 5.6 6.3 6.0 6.1  ?NEUTROABS 9.4* 6.9 4.3  --   --   --   ?HGB 8.5* 8.9* 9.9* 8.9* 9.1* 9.6*  ?HCT 26.2* 28.5* 31.5* 28.6* 28.9* 31.1*  ?MCV 89.7 91.3 90.8 89.9 90.0 90.9  ?PLT 262 282 344 319 346 479*  ? ?BMP &GFR ?Recent  Labs  ?Lab 05/09/21 ?0110 05/10/21 ?0102 05/11/21 ?1049 05/12/21 ?0139 05/13/21 ?0116 05/15/21 ?[id number]  ?NA 139 138 140 140 138 136  ?K 4.5 4.6 4.4 4.3 4.4 4.2  ?CL 106 107 107 104 105 103  ?CO2 24 23 23 26  2

## 2021-05-15 NOTE — Progress Notes (Signed)
Speech Language Pathology Treatment: Dysphagia  ?Patient Details ?Name: [name] ?MRN: [id number] ?DOB: 05-03-1960 ?Today's Date: 05/15/2021 ?Time: 7408-1448 ?SLP Time Calculation (min) (ACUTE ONLY): 21 min ? ?Assessment / Plan / Recommendation ?Clinical Impression ? [name] is doing well with her swallowing. She verbalized strategies given to her yesterday.  Encouraged intermittent throat clearing during course of meals.  She consumed solids and thin liquids during our session with no overt difficulty.  She prefers to remain on dysphagia 3 tray at this time and to take pills whole with puree unless large (then she will crush if able).  Independent with precautions. There are no further acute care SLP issues at this time. Pt will not need f/u SLP after D/C. Our service will sign off. Pt agrees with plan. ? ?  ?HPI HPI: Patient is a 61 y.o. female with PMH: MS, ANEMIA, GERD, HTN, IBS, seizures. She presented to the hospital c/o weakness and leg pain for 3 weeks. In the ER she was hypotensive, tachycardic and required vasopressors. She recieved 5/2 L fluid overnight and was hyperkalemic, in renal failure and had leukocytosis. CXR in ER showed pneumothorax and chest tube was placed. She is being treated for Septic shock due to cellulitis in setting of immunocompromised state improved. SLP swallow eval ordered due to RN concern for patient's ability to swallow PO's. Patient had a second chest tube placed 4/25 and later that evening code stroke was called; neurology did not see neurological focality. On 4/27 a second, large bore chest tube was replaced. Chest tubes removed 5/2. ?  ?   ?SLP Plan ? All goals met ? ?  ?  ?Recommendations for follow up therapy are one component of a multi-disciplinary discharge planning process, led by the attending physician.  Recommendations may be updated based on patient status, additional functional criteria and insurance authorization. ?  ? ?Recommendations  ?Diet  recommendations: Dysphagia 3 (mechanical soft);Thin liquid ?Liquids provided via: Straw;Cup ?Medication Administration: Whole meds with puree ?Supervision: Patient able to self feed ?Compensations: Slow rate;Small sips/bites;Clear throat intermittently;Follow solids with liquid  ?   ?    ?   ? ? ? ? Oral Care Recommendations: Oral care BID ?Follow Up Recommendations: No SLP follow up ?Plan: All goals met ? ? ? ? ?  ?  ? ?[name] L. Nancy Piggott, MA CCC/SLP ?Acute Rehabilitation Services ?Office number [phone] ?Pager [phone] ? ?[name] ? ?05/15/2021, 1:54 PM ?

## 2021-05-16 DIAGNOSIS — J9601 Acute respiratory failure with hypoxia: Secondary | ICD-10-CM | POA: Diagnosis not present

## 2021-05-16 DIAGNOSIS — N179 Acute kidney failure, unspecified: Secondary | ICD-10-CM | POA: Diagnosis not present

## 2021-05-16 DIAGNOSIS — A419 Sepsis, unspecified organism: Secondary | ICD-10-CM | POA: Diagnosis not present

## 2021-05-16 DIAGNOSIS — J939 Pneumothorax, unspecified: Secondary | ICD-10-CM | POA: Diagnosis not present

## 2021-05-16 LAB — GLUCOSE, CAPILLARY
Glucose-Capillary: 85 mg/dL (ref 70–99)
Glucose-Capillary: 86 mg/dL (ref 70–99)

## 2021-05-16 MED ORDER — IPRATROPIUM-ALBUTEROL 0.5-2.5 (3) MG/3ML IN SOLN: 3.0000 mL | Freq: Four times a day (QID) | RESPIRATORY_TRACT | Status: DC | PRN

## 2021-05-16 MED ORDER — GUAIFENESIN-CODEINE 100-10 MG/5ML PO SOLN
5.0000 mL | Freq: Four times a day (QID) | ORAL | Status: DC | PRN
Start: 2021-05-16 — End: 2021-05-18

## 2021-05-16 MED ORDER — ENOXAPARIN SODIUM 40 MG/0.4ML IJ SOSY
40.0000 mg | PREFILLED_SYRINGE | Freq: Every day | INTRAMUSCULAR | Status: DC
  Administered 2021-05-16 – 2021-05-18 (×3): 40 mg via SUBCUTANEOUS
  Filled 2021-05-16 (×3): qty 0.4

## 2021-05-16 NOTE — TOC Progression Note (Signed)
Transition of Care (TOC) - Progression Note  ? ? ?Patient Details  ?Name: MYLIAH MEDEL ?MRN: [id number] ?Date of Birth: 01/13/1960 ? ?Transition of Care (TOC) CM/SW Contact  ?Joanathan Affeldt, [place], [place] ?Phone Number: ?05/16/2021, 10:57 AM ? ?Clinical Narrative:    ?Phone call to patient to discuss bed offers. Patient states that she is thinking of returning home with [facility]. This social discussed recommendation for SNF, patient states that she was not agreeable with ned offers presented and needed to research them more. She and her husband will continue to discuss to make more of a definitive decision. Husband plans to research bed offers further. ? ?[employer], LCSW ?Transition of Care ? ? ? ? ?Expected Discharge Plan: Long Term Acute Care (LTAC) ?Barriers to Discharge: Other (must enter comment) (acceptance and insurance approval for LTAC) ? ?Expected Discharge Plan and Services ?Expected Discharge Plan: Long Term Acute Care (LTAC) ?In-house Referral: Clinical Social Work ?  ?Post Acute Care Choice: Long Term Acute Care (LTAC) ?Living arrangements for the past 2 months: Single Family Home ?                ?  ?  ?  ?  ?  ?  ?  ?  ?  ?  ? ? ?Social Determinants of Health (SDOH) Interventions ?  ? ?Readmission Risk Interventions ?   ? View : No data to display.  ?  ?  ?  ? ? ?

## 2021-05-16 NOTE — Progress Notes (Signed)
?PROGRESS NOTE ? ?[name] [id number] DOB: 1960/08/13  ? ?[facility]: [facility] ? ?Patient is from: Home.  Lives with family.  Uses rolling walker at baseline. ? ?DOA: 04/26/2021 LOS: 20 ? ?Chief complaints ?Chief Complaint  ?Patient presents with  ? Weakness  ?  ? ?Brief Narrative / Interim history: ?61 year old F with PMH of MS ambulatory with walker, ankylosing spondylitis, seizure disorder, fibromyalgia and GERD presenting with RLE swelling and scaling, as well as progressive weakness and poor p.o. intake, and admitted to ICU with septic shock due to RLE cellulitis, AKI and acute metabolic encephalopathy in the setting of sepsis and uremia, and admitted to ICU requiring vasopressor and broad-spectrum antibiotics.  Hospital course complicated by acute respiratory failure with hypoxia in the setting of aspiration pneumonia and bilateral pneumothorax due to R IJ central line complication requiring mechanical ventilation and bilateral chest tube.  Eventually, she was extubated and weaned off vasopressors.  Chest tube removed.  She was transferred to [facility] on 5/4.  Therapy recommends SNF.  On dysphagia 3 diet per SLP. ? ?Medically optimized for discharge to SNF.  ? ?Subjective: ?Seen and examined earlier this morning.  No major events overnight of this morning.  No complaints.  Patient's husband at bedside. ? ?Objective: ?Vitals:  ? 05/15/21 1930 05/15/21 1942 05/16/21 [id number] 05/16/21 0905  ?BP:  (!) 109/55 112/63 106/74  ?Pulse:  (!) 105 (!) 103 90  ?Resp:  18    ?Temp:  98 ?F (36.7 ?C) 98.2 ?F (36.8 ?C) 98.2 ?F (36.8 ?C)  ?TempSrc:  Oral Oral Oral  ?SpO2: 95% 94% 97% 94%  ?Weight:      ?Height:      ? ? ?Examination: ? ?GENERAL: No apparent distress.  Nontoxic. ?HEENT: MMM.  Vision and hearing grossly intact.  ?NECK: Supple.  No apparent JVD.  ?RESP:  No IWOB.  Fair aeration bilaterally. ?CVS:  RRR. Heart sounds normal.  ?ABD/GI/GU: BS+. Abd soft, NTND.  ?MSK/EXT:  Moves  extremities.  BLE weakness.  Dressing over BLE DCI.  Dry scaly skin distally. ?SKIN: As above. ?NEURO: Awake and alert. Oriented appropriately.  No apparent focal neuro deficit. ?PSYCH: Calm. Normal affect.  ? ?Procedures:  ?Intubation ?Chest tube placement and removal ? ?Microbiology summarized: ?4/17-COVID-19 and influenza PCR nonreactive. ?4/17-MRSA PCR screen negative. ?4/17-urine culture with Gardnerella vaginalis ?4/17-blood culture NGTD ?4/21-urine culture with 40,000 colonies of mixed ? ?Assessment and Plan: ?* Septic shock due to RLE cellulitis in immunocompromised patient ?Culture data as above.  Completed antibiotic course.  Sepsis physiology resolved.  On room air.  ? ?Acute respiratory failure with hypoxia ([place]) ?Likely due to aspiration pneumonia and bilateral pneumothorax.  Resolved.  On room air. ?-Discontinue ICS/LABA ?-DuoNeb as needed ?-Encourage incentive spirometry ? ?Bilateral pneumothorax ?Felt to be a complication from RIJ central line.  Pigtail failed to fix it.  Required intraparenchymal large bore chest tube, now removed.  Currently on room air.  CT surgery seems to have signed off. ? ?AKI (acute kidney injury) ([place]) ?Resolved. ? ?MS (multiple sclerosis) ([place]) ?At baseline, ambulates using walker.  On ocrelizumab outpatient. ?-Outpatient follow-up ? ?Dysphagia ?SLP recommends dysphagia 3 diet. ? ?Acute metabolic encephalopathy ?Multifactorial including severe sepsis and uremia.  Resolved. ? ?Physical deconditioning ?Uses rolling walker at baseline. ?-Therapy recommended SNF. ? ?Normocytic anemia ?H&H stable.  Monitor intermittently. ? ?Seizure disorder ([place]) ?Continue home [place]. ? ? ?Pressure skin injury: ?Pressure Injury 05/07/21 Sacrum Mid Stage 2 -  Partial thickness loss of dermis  presenting as a shallow open injury with a red, pink wound bed without slough. (Active)  ?05/07/21 2000  ?Location: Sacrum  ?Location Orientation: Mid  ?Staging: Stage 2 -  Partial thickness loss of dermis  presenting as a shallow open injury with a red, pink wound bed without slough.  ?Wound Description (Comments):   ?Present on Admission: No  ?Dressing Type Foam - Lift dressing to assess site every shift 05/15/21 2110  ? ?DVT prophylaxis:  ?enoxaparin (LOVENOX) injection 40 mg Start: 05/16/21 1000 ? ?Code Status: Full code ?Family Communication: Updated patient's husband at bedside. ?Level of care: Med-Surg ?Status is: Inpatient ?Remains inpatient appropriate because: Safe disposition/SNF.  Medically stable for discharge ? ? ?Final disposition: SNF ?Consultants:  ?Pulmonology admitted patient. ?Cardiothoracic surgery-signed off. ? ?Sch Meds:  ?Scheduled Meds: ? chlorhexidine  15 mL Mouth Rinse BID  ? enoxaparin (LOVENOX) injection  40 mg Subcutaneous Daily  ? feeding supplement  237 mL Oral TID BM  ? gabapentin  300 mg Oral Q12H  ? glycopyrrolate  1 mg Oral BID  ? ipratropium-albuterol  3 mL Nebulization BID  ? levETIRAcetam  500 mg Oral BID  ? mouth rinse  15 mL Mouth Rinse q12n4p  ? melatonin  5 mg Oral QHS  ? multivitamin with minerals  1 tablet Oral Daily  ? sodium chloride flush  10-40 mL Intracatheter Q12H  ? ?Continuous Infusions: ? sodium chloride Stopped (05/12/21 0954)  ? sodium chloride Stopped (05/06/21 0000)  ? ?PRN Meds:.sodium chloride, acetaminophen (TYLENOL) oral liquid 160 mg/5 mL, guaiFENesin-codeine, ipratropium-albuterol, menthol-cetylpyridinium, naLOXone (NARCAN)  injection, ondansetron (ZOFRAN) IV, oxyCODONE, sodium chloride flush, white petrolatum ? ?Antimicrobials: ?Anti-infectives (From admission, onward)  ? ? Start     Dose/Rate Route Frequency Ordered Stop  ? 05/07/21 0900  Ampicillin-Sulbactam (UNASYN) 3 g in sodium chloride 0.9 % 100 mL IVPB       ? 3 g ?200 mL/hr over 30 Minutes Intravenous Every 8 hours 05/07/21 0816 05/12/21 0135  ? 05/05/21 1015  fluconazole (DIFLUCAN) tablet 150 mg       ? 150 mg Per Tube  Once 05/05/21 0919 05/05/21 1015  ? 04/29/21 1400  cefTRIAXone (ROCEPHIN) 2 g  in sodium chloride 0.9 % 100 mL IVPB       ? 2 g ?200 mL/hr over 30 Minutes Intravenous Every 24 hours 04/29/21 0831 05/05/21 1420  ? 04/28/21 0715  vancomycin (VANCOREADY) IVPB 750 mg/150 mL  Status:  Discontinued       ? 750 mg ?150 mL/hr over 60 Minutes Intravenous Every 24 hours 04/28/21 0620 04/28/21 0817  ? 04/27/21 2200  piperacillin-tazobactam (ZOSYN) IVPB 3.375 g  Status:  Discontinued       ? 3.375 g ?12.5 mL/hr over 240 Minutes Intravenous Every 8 hours 04/27/21 2021 04/29/21 0831  ? 04/27/21 1100  piperacillin-tazobactam (ZOSYN) IVPB 3.375 g  Status:  Discontinued       ? 3.375 g ?12.5 mL/hr over 240 Minutes Intravenous Every 12 hours 04/26/21 1305 04/27/21 2021  ? 04/27/21 1000  vancomycin variable dose per unstable renal function (pharmacist dosing)  Status:  Discontinued       ?  Does not apply See admin instructions 04/26/21 1305 04/28/21 0817  ? 04/26/21 1315  vancomycin (VANCOREADY) IVPB 500 mg/100 mL       ? 500 mg ?100 mL/hr over 60 Minutes Intravenous NOW 04/26/21 1305 04/26/21 1801  ? 04/26/21 1100  vancomycin (VANCOCIN) IVPB 1000 mg/200 mL premix       ?  1,000 mg ?200 mL/hr over 60 Minutes Intravenous  Once 04/26/21 1056 04/26/21 1423  ? 04/26/21 1100  ceFEPIme (MAXIPIME) 2 g in sodium chloride 0.9 % 100 mL IVPB       ? 2 g ?200 mL/hr over 30 Minutes Intravenous  Once 04/26/21 1056 04/26/21 1143  ? ?  ? ? ? ?I have personally reviewed the following labs and images: ?CBC: ?Recent Labs  ?Lab 05/10/21 ?0102 05/11/21 ?1055 05/12/21 ?0139 05/13/21 ?0116 05/15/21 ?[id number]  ?WBC 8.1 5.6 6.3 6.0 6.1  ?NEUTROABS 6.9 4.3  --   --   --   ?HGB 8.9* 9.9* 8.9* 9.1* 9.6*  ?HCT 28.5* 31.5* 28.6* 28.9* 31.1*  ?MCV 91.3 90.8 89.9 90.0 90.9  ?PLT 282 344 319 346 479*  ? ?BMP &GFR ?Recent Labs  ?Lab 05/10/21 ?0102 05/11/21 ?1049 05/12/21 ?0139 05/13/21 ?0116 05/15/21 ?[id number]  ?NA 138 140 140 138 136  ?K 4.6 4.4 4.3 4.4 4.2  ?CL 107 107 104 105 103  ?CO2 23 23 26 22 22   ?GLUCOSE 140* 117* 122* 140* 88  ?BUN 21 25* 32*  28* 25*  ?CREATININE 0.53 0.60 0.60 0.57 0.73  ?CALCIUM 8.4* 9.0 8.8* 9.0 9.3  ?MG 1.8 2.0  --   --  2.2  ?PHOS  --   --   --   --  5.9*  ? ?Estimated Creatinine Clearance: 62.2 mL/min (by C-G formula b

## 2021-05-17 DIAGNOSIS — A419 Sepsis, unspecified organism: Secondary | ICD-10-CM | POA: Diagnosis not present

## 2021-05-17 DIAGNOSIS — R6521 Severe sepsis with septic shock: Secondary | ICD-10-CM | POA: Diagnosis not present

## 2021-05-17 NOTE — TOC CM/SW Note (Cosign Needed)
?  ?  Durable Medical Equipment  ?(From admission, onward)  ?  ? ? ?  ? ?  Start     Ordered  ? 05/17/21 1348  For home use only DME standard manual wheelchair with seat cushion  Once       ?Comments: Patient suffers from [place],  Septic shock due to RLE cellulitis in immunocompromised patient  which impairs their ability to perform daily activities like ambulating  in the home.  A cane  will not resolve issue with performing activities of daily living. A wheelchair will allow patient to safely perform daily activities. Patient can safely propel the wheelchair in the home or has a caregiver who can provide assistance. Length of need lifetime . ?Accessories: elevating leg rests (ELRs), wheel locks, extensions and anti-tippers. ? ?Seat and back cushions  ? 05/17/21 1348  ? ?  ?  ? ?  ?  ?

## 2021-05-17 NOTE — Progress Notes (Signed)
PROGRESS NOTE  [name]  WNU:[id number] DOB: 1960-09-29 DOA: 04/26/2021 PCP: [facility]   Brief Narrative:  Patient is a 43 female with history of multiple sclerosis ambulatory with walker, ankylosing spondylitis, GERD, seizure disorder, fibromyalgia who presented here with complaints of increased right lower extremity swelling, scaling, progressive weakness, poor oral intake.  On vent since she was hypertensive, tachycardic, encephalopathic.  Lab work showed elevated leukocytes, elevated creatinine in the range of 7, hypokalemia.  Patient was admitted under PCCM service for management of septic shock secondary to cellulitis of right lower extremity, started on broad-spectrum antibiotics.  Hospital course remarkable for hypoxia.  Imaging showed bilateral pneumothorax, status post chest tube placement and removal.  She was also treated for aspiration pneumonia during this hospitalization.  PT/OT recommending SNF on discharge but patient is interested in home health.  If she continues to decline SNF, will plan to discharge to home tomorrow.  Case manager notified.  Assessment & Plan:  Principal Problem:   Septic shock due to RLE cellulitis in immunocompromised patient Active Problems:   Bilateral pneumothorax   Acute respiratory failure with hypoxia (HCC)   MS (multiple sclerosis) (HCC)   AKI (acute kidney injury) (HCC)   Acute metabolic encephalopathy   Dysphagia   Physical deconditioning   Seizure disorder (HCC)   Normocytic anemia   GERD (gastroesophageal reflux disease)   HLA B27 (HLA B27 positive)   Pulmonary nodule   Cellulitis of both lower extremities   High anion gap metabolic acidosis   Hyperkalemia   Pressure injury of skin   Septic shock secondary to right lower extremity cellulitis: Presented with weakness, cellulitis of right lower extremity.  Found to be hypotensive on presentation, with leukocytosis, AKI, hyperkalemia, encephalopathy, hypoxic.   Initially required vasopressors.  Sepsis physiology has resolved.  Finished  antibiotics for aspiration pneumonia.  Bilateral pneumothorax: Due to RIJ central line complication , pigtail failed to fix it and then she got an intraparenchymal large bore chest tube.Status post bilateral chest tubes placement ,now removed.  CT surgery was following.    On room air today  AKI/hyperkalemia: Presented with severe AKI.  Kidney function has normalized.  Nephrology was  following.  History of COPD/tobacco use: Currently not in exacerbation.  Follows with Dr. Sherene Sires.  She also has history of left upper lobe cavitary lesion.  Continue bronchodilators, nicotine patch.  We recommend to follow-up with pulmonology as an outpatient.  Acute metabolic encephalopathy/delirium: Hospitalist remarkable for delirium.  She was started  on Seroquel, BuSpar .  We will hold these medications now because she is currently alert and oriented.  Continue gabapentin that she takes at home.  Multiple sclerosis/ankylosing spondylitis: At baseline, she is able to walk with the help of walker. On ocrelizumab for MS as outpatient therapy.Follows with rheumatology.  Presented with weakness.  Currently deconditioned.  PT/OT recommending LTAC /SNF on DC.  Dysphagia: Minimal intake during this hospitalization so tube feeding was started,now removed.   Speech therapy were closely following.  Recommended dysphagia 3 diet   Normocytic anemia: Most likely secondary to acute illness.  Currently hemoglobin stable in the range of 8-9.  Continue to monitor  Hyperglycemia: Hemoglobin A1c of 5.  She was started on sliding scale insulin for hyperglycemia.  Currently stable  Stage II sacral ulcer/chronic venous stasis changes/superficial lower extremity ulcers: Continue protective foam pads, continue pressure offloading.  Continue wound care  Debility/deconditioning: PT/OT recommending LTACH/skilled nursing facility on discharge.She lives with her  family, ambulates with  the help of walker.  Patient is interested in home health.     Nutrition Problem: Increased nutrient needs Etiology: acute illness, wound healing Pressure Injury 05/07/21 Sacrum Mid Stage 2 -  Partial thickness loss of dermis presenting as a shallow open injury with a red, pink wound bed without slough. (Active)  05/07/21 2000  Location: Sacrum  Location Orientation: Mid  Staging: Stage 2 -  Partial thickness loss of dermis presenting as a shallow open injury with a red, pink wound bed without slough.  Wound Description (Comments):   Present on Admission: No  Dressing Type Foam - Lift dressing to assess site every shift 05/17/21 0957    DVT prophylaxis:enoxaparin (LOVENOX) injection 40 mg Start: 05/16/21 1000     Code Status: Full Code  Family Communication: Husband  at bedside  Patient status:Inpatient  Patient is from :Home  Antic[facility]ated discharge to: [facility]   Estimated DC date:tomorrow   Consultants: PCCM, cardiothoracic surgery  Procedures: Chest tube placement  Antimicrobials:  Anti-infectives (From admission, onward)    Start     Dose/Rate Route Frequency Ordered Stop   05/07/21 0900  Ampicillin-Sulbactam (UNASYN) 3 g in sodium chloride 0.9 % 100 mL IVPB        3 g 200 mL/hr over 30 Minutes Intravenous Every 8 hours 05/07/21 0816 05/12/21 0135   05/05/21 1015  fluconazole (DIFLUCAN) tablet 150 mg        150 mg Per Tube  Once 05/05/21 0919 05/05/21 1015   04/29/21 1400  cefTRIAXone (ROCEPHIN) 2 g in sodium chloride 0.9 % 100 mL IVPB        2 g 200 mL/hr over 30 Minutes Intravenous Every 24 hours 04/29/21 0831 05/05/21 1420   04/28/21 0715  vancomycin (VANCOREADY) IVPB 750 mg/150 mL  Status:  Discontinued        750 mg 150 mL/hr over 60 Minutes Intravenous Every 24 hours 04/28/21 0620 04/28/21 0817   04/27/21 2200  piperacillin-tazobactam (ZOSYN) IVPB 3.375 g  Status:  Discontinued        3.375 g 12.5 mL/hr over 240 Minutes Intravenous Every  8 hours 04/27/21 2021 04/29/21 0831   04/27/21 1100  piperacillin-tazobactam (ZOSYN) IVPB 3.375 g  Status:  Discontinued        3.375 g 12.5 mL/hr over 240 Minutes Intravenous Every 12 hours 04/26/21 1305 04/27/21 2021   04/27/21 1000  vancomycin variable dose per unstable renal function (pharmacist dosing)  Status:  Discontinued         Does not apply See admin instructions 04/26/21 1305 04/28/21 0817   04/26/21 1315  vancomycin (VANCOREADY) IVPB 500 mg/100 mL        500 mg 100 mL/hr over 60 Minutes Intravenous NOW 04/26/21 1305 04/26/21 1801   04/26/21 1100  vancomycin (VANCOCIN) IVPB 1000 mg/200 mL premix        1,000 mg 200 mL/hr over 60 Minutes Intravenous  Once 04/26/21 1056 04/26/21 1423   04/26/21 1100  ceFEPIme (MAXIPIME) 2 g in sodium chloride 0.9 % 100 mL IVPB        2 g 200 mL/hr over 30 Minutes Intravenous  Once 04/26/21 1056 04/26/21 1143       Subjective: Patient seen and examined at the bedside this afternoon.  Hemodynamically stable, lying in bed.  Looks very deconditioned, weak.  Husband at the bedside.  She declines to go to skilled nursing facility.  We had a long discussion and she is really interested to go to home.  She  has support from her husband.  She was still waiting to see the physical therapist today and we learned that her wheelchair will be delivered tomorrow.  We are planning to send her home tomorrow  Objective: Vitals:   05/16/21 2055 05/17/21 0500 05/17/21 0617 05/17/21 0735  BP:   111/68 115/67  Pulse:   95 91  Resp:   18 16  Temp:   98.1 F (36.7 C) 98.1 F (36.7 C)  TempSrc:   Oral Oral  SpO2: 95%  95% 99%  Weight:  59.2 kg    Height:        Intake/Output Summary (Last 24 hours) at 05/17/2021 1258 Last data filed at 05/16/2021 2050 Gross per 24 hour  Intake 120 ml  Output 300 ml  Net -180 ml   Filed Weights   05/13/21 0346 05/14/21 0422 05/17/21 0500  Weight: 61.4 kg 62.1 kg 59.2 kg    Examination:   General exam: Overall  comfortable, not in distress, very deconditioned, chronically ill looking HEENT: PERRL Respiratory system:  no wheezes or crackles  Cardiovascular system: S1 & S2 heard, RRR.  Gastrointestinal system: Abdomen is nondistended, soft and nontender. Central nervous system: Alert and oriented Extremities: No edema, no clubbing ,no cyanosis, bilateral lower extremity chronic venous stasis changes with erythematous areas Skin: No rashes, no ulcers,no icterus      Data Reviewed: I have personally reviewed following labs and imaging studies  CBC: Recent Labs  Lab 05/11/21 1055 05/12/21 0139 05/13/21 0116 05/15/21 0046  WBC 5.6 6.3 6.0 6.1  NEUTROABS 4.3  --   --   --   HGB 9.9* 8.9* 9.1* 9.6*  HCT 31.5* 28.6* 28.9* 31.1*  MCV 90.8 89.9 90.0 90.9  PLT 344 319 346 479*   Basic Metabolic Panel: Recent Labs  Lab 05/11/21 1049 05/12/21 0139 05/13/21 0116 05/15/21 0046  NA 140 140 138 136  K 4.4 4.3 4.4 4.2  CL 107 104 105 103  CO2 23 26 22 22   GLUCOSE 117* 122* 140* 88  BUN 25* 32* 28* 25*  CREATININE 0.60 0.60 0.57 0.73  CALCIUM 9.0 8.8* 9.0 9.3  MG 2.0  --   --  2.2  PHOS  --   --   --  5.9*     No results found for this or any previous visit (from the past 240 hour(s)).   Radiology Studies: No results found.  Scheduled Meds:  enoxaparin (LOVENOX) injection  40 mg Subcutaneous Daily   feeding supplement  237 mL Oral TID BM   gabapentin  300 mg Oral Q12H   glycopyrrolate  1 mg Oral BID   levETIRAcetam  500 mg Oral BID   melatonin  5 mg Oral QHS   multivitamin with minerals  1 tablet Oral Daily   sodium chloride flush  10-40 mL Intracatheter Q12H   Continuous Infusions:  sodium chloride Stopped (05/12/21 0954)   sodium chloride Stopped (05/06/21 0000)     LOS: 21 days   Burnadette Pop, MD [facility] P5/08/2021, 12:58 PM

## 2021-05-17 NOTE — TOC Initial Note (Addendum)
Transition of Care (TOC) - Initial/Assessment Note  ? ? ?Patient Details  ?Name: [name] ?MRN: [id number] ?Date of Birth: 03/26/1960 ? ?Transition of Care (TOC) CM/SW Contact:    ?Marilu Favre, RN ?Phone Number: ?05/17/2021, 1:53 PM ? ?Clinical Narrative:                 ?Spoke to patient and husband ( with patient's consent ) at bedside. Patient from home. Husband is taking a leave of absence from work to stay with [name]. They both helped take care of patient's mother who had MS.  ? ? ?Patient prefers to go home with home health at discharge. Choice offered no preference.  ? ?Tommi Rumps with [facility] accepted referral for HHRN,PT,OT. Husband will need to learn wound care.  ? ?PT to see patient this afternoon. Last PT recommended hospital bed and wheel chair. Patient does not want hospital bed but does want wheel chair prefers smaller w/c to fit through door ways. Explained insurance goes by height and weight, NCM ordered wheel chair with [facility] with [facility] and made request of smaller wheel chair.  ? ?Patient on room air  ? ?Expected Discharge Plan: [place] ?Barriers to Discharge: Continued Medical Work up ? ? ?Patient Goals and CMS Choice ?Patient states their goals for this hospitalization and ongoing recovery are:: to return to home ?CMS Medicare.gov Compare Post Acute Care list provided to:: Patient ?Choice offered to / list presented to : Patient, Spouse ? ?Expected Discharge Plan and Services ?Expected Discharge Plan: [place] ?In-house Referral: Clinical Social Work ?Discharge Planning Services: CM Consult ?Post Acute Care Choice: Home Health ?Living arrangements for the past 2 months: [place] ?                ?DME Arranged: Wheelchair manual ?DME Agency: [employer] ?Date DME Agency Contacted: 05/17/21 ?Time DME Agency Contacted: 3500 ?Representative spoke with at DME Agency: Delana Meyer ?HH Arranged: PT, OT, RN ?[place] Agency: [place] ?Date HH Agency Contacted: 05/17/21 ?Time [place]: [phone] ?Representative spoke with at [place]: Tommi Rumps ? ?Prior Living Arrangements/Services ?Living arrangements for the past 2 months: [place] ?Lives with:: Spouse ?Patient language and need for interpreter reviewed:: Yes ?Do you feel safe going back to the place where you live?: Yes      ?Need for Family Participation in Patient Care: Yes (Comment) ?Care giver support system in place?: Yes (comment) ?Current home services: DME ?Criminal Activity/Legal Involvement Pertinent to Current Situation/Hospitalization: No - Comment as needed ? ?Activities of Daily Living ?Home Assistive Devices/Equipment: None ?ADL Screening (condition at time of admission) ?Patient's cognitive ability adequate to safely complete daily activities?: No ?Is the patient deaf or have difficulty hearing?: No ?Does the patient have difficulty seeing, even when wearing glasses/contacts?: No ?Does the patient have difficulty concentrating, remembering, or making decisions?: Yes ?Patient able to express need for assistance with ADLs?: No ?Does the patient have difficulty dressing or bathing?: Yes ?Independently performs ADLs?: No ?Communication: Appropriate for developmental age ?Dressing (OT): Needs assistance ?Is this a change from baseline?: Change from baseline, expected to last >3 days ?Grooming: Needs assistance ?Is this a change from baseline?: Change from baseline, expected to last >3 days ?Feeding: Needs assistance ?Is this a change from baseline?: Change from baseline, expected to last >3 days ?Bathing: Needs assistance ?Is this a change from baseline?: Change from baseline, expected to last >3 days ?Toileting: Needs assistance ?Is  this a change from baseline?: Change from baseline, expected to last >3days ?In/Out Bed: Needs assistance ?Is this a change from baseline?: Change from baseline, expected to last >3 days ?Walks in Home: Needs assistance ?Is this a  change from baseline?: Change from baseline, expected to last >3 days ?Does the patient have difficulty walking or climbing stairs?: Yes ?Weakness of Legs: Both ?Weakness of Arms/Hands: Both ? ?Permission Sought/Granted ?Permission sought to share information with : Family Supports ?Permission granted to share information with : No ? Share Information with NAME: husband Chriss Czar, daughter Maxcine Ham ?   ?   ?   ? ?Emotional Assessment ?Appearance:: Appears stated age ?Attitude/Demeanor/Rapport: Engaged ?Affect (typically observed): Accepting ?Orientation: : Oriented to Self, Oriented to Place, Oriented to  Time, Oriented to Situation ?Alcohol / Substance Use: Not Applicable ?Psych Involvement: No (comment) ? ?Admission diagnosis:  Shock ([place]) [R57.9] ?Cellulitis of right lower extremity [L03.115] ?Patient Active Problem List  ? Diagnosis Date Noted  ? Dysphagia 05/14/2021  ? Physical deconditioning 05/14/2021  ? Acute respiratory failure with hypoxia ([place])   ? Pressure injury of skin 05/08/2021  ? HLA B27 (HLA B27 positive) 04/26/2021  ? Cellulitis of both lower extremities 04/26/2021  ? High anion gap metabolic acidosis 09/81/1914  ? Hyperkalemia 04/26/2021  ? Acute metabolic encephalopathy 78/29/5621  ? Bilateral pneumothorax 04/26/2021  ? Normocytic anemia   ? GERD (gastroesophageal reflux disease)   ? Pulmonary nodule   ? MS (multiple sclerosis) ([place])   ? AKI (acute kidney injury) ([place])   ? Septic shock due to RLE cellulitis in immunocompromised patient   ? COPD  GOLD ? / still smoking  11/02/2020  ? Multiple pulmonary nodules determined by computed tomography of lung 11/02/2020  ? Seizure disorder ([place]) 09/27/2012  ? ?PCP:  [facility] [place] ?Pharmacy:   ?[id number] [place] [place]?[id number]?Phone: [phone]: [phone]? ? ? ? ?Social Determinants of Health (SDOH) Interventions ?  ? ?Readmission Risk Interventions ?   ? View : No data to  display.  ?  ?  ?  ? ? ? ?

## 2021-05-17 NOTE — Progress Notes (Signed)
Physical Therapy Treatment ?Patient Details ?Name: [name] ?MRN: [id number] ?DOB: 1960/04/04 ?Today's Date: 05/17/2021 ? ? ?History of Present Illness Pt adm 4/17 with septic shock due to RLE cellulitis and PNA. Pt developed acute hypoxic respiratory failure and placed on bipap 4/19. Developed iatrogenic pneumothorax and chest tube placed. PMH - Multiple Sclerosis, seizure, ankylosing spondylitis, fibromyalgia ? ?  ?PT Comments  ? ? Pt was seen for progression of mobility on side of bed and to stand, then to control balance to try to sidestep.  Pt is significantly weak on knees and even with support there physically cannot step to the side of the bed.  Follow up is recommended to SNF care, due to the significant depth of weakness and safety to stand.  Pt is talking about going directly home so will default to [place] and will need to have a wheelchair for safety to move.  Pt may also need help with EMT transport as she has a set of steps to get in.  Follow acutely for goals of PT as pt can tolerate, focusing on standing endurance, strength and balance toward gait skills.  ?Recommendations for follow up therapy are one component of a multi-disciplinary discharge planning process, led by the attending physician.  Recommendations may be updated based on patient status, additional functional criteria and insurance authorization. ? ?Follow Up Recommendations ? Skilled nursing-short term rehab (<3 hours/day) ?  ?  ?Assistance Recommended at Discharge Frequent or constant Supervision/Assistance  ?Patient can return home with the following Two people to help with walking and/or transfers;A lot of help with bathing/dressing/bathroom;Assistance with cooking/housework;Direct supervision/assist for medications management;Direct supervision/assist for financial management;Assist for transportation;Help with stairs or ramp for entrance ?  ?Equipment Recommendations ? Hospital bed;Wheelchair (measurements PT);Wheelchair cushion  (measurements PT)  ?  ?Recommendations for Other Services OT consult ? ? ?  ?Precautions / Restrictions Precautions ?Precautions: Fall;Other (comment) ?Precaution Comments: watch sats ?Restrictions ?Weight Bearing Restrictions: No  ?  ? ?Mobility ? Bed Mobility ?Overal bed mobility: Needs Assistance ?Bed Mobility: Supine to Sit, Sit to Supine ?  ?  ?Supine to sit: Mod assist ?Sit to supine: Max assist ?  ?General bed mobility comments: pt assists with trunk to get OOB but fairly weak to get back to bed ?  ? ?Transfers ?Overall transfer level: Needs assistance ?Equipment used: Rolling walker (2 wheels), 1 person hand held assist ?Transfers: Sit to/from Stand ?Sit to Stand: Mod assist ?  ?  ?  ?  ? Lateral/Scoot Transfers: Max assist ?General transfer comment: standing wtih support on R knee, unable to take even a sidestep with mm fatigue ?  ? ?Ambulation/Gait ?  ?  ?  ?  ?  ?  ?  ?  ? ? ?Stairs ?  ?  ?  ?  ?  ? ? ?Wheelchair Mobility ?  ? ?Modified Rankin (Stroke Patients Only) ?  ? ? ?  ?Balance Overall balance assessment: Needs assistance ?Sitting-balance support: Feet supported ?Sitting balance-Leahy Scale: Fair ?  ?  ?Standing balance support: Bilateral upper extremity supported, During functional activity ?Standing balance-Leahy Scale: Poor ?  ?  ?  ?  ?  ?  ?  ?  ?  ?  ?  ?  ?  ? ?  ?Cognition Arousal/Alertness: Awake/alert ?Behavior During Therapy: Anxious ?Overall Cognitive Status: Impaired/Different from baseline ?Area of Impairment: Awareness, Safety/judgement, Attention, Following commands ?  ?  ?  ?  ?  ?  ?  ?  ?Orientation Level:  Situation ?Current Attention Level: Selective ?  ?Following Commands: Follows one step commands with increased time ?Safety/Judgement: Decreased awareness of safety, Decreased awareness of deficits ?Awareness: Emergent, Intellectual ?Problem Solving: Slow processing, Requires verbal cues, Requires tactile cues ?General Comments: pt is unable to marshall standing power up effort  on walker but with direct assistance is more participatory ?  ?  ? ?  ?Exercises   ? ?  ?General Comments General comments (skin integrity, edema, etc.): pt is assisted to stand and take steps, able to sidestep once but then RLE fatigues even with direct support on the knee ?  ?  ? ?Pertinent Vitals/Pain Pain Assessment ?Pain Assessment: Faces ?Faces Pain Scale: No hurt ?Facial Expression: Relaxed, neutral ?Body Movements: Absence of movements ?Muscle Tension: Relaxed  ? ? ?Home Living   ?  ?  ?  ?  ?  ?  ?  ?  ?  ?   ?  ?Prior Function    ?  ?  ?   ? ?PT Goals (current goals can now be found in the care plan section) Acute Rehab PT Goals ?Patient Stated Goal: To be able to get around at home like she was ?Progress towards PT goals: Progressing toward goals ? ?  ?Frequency ? ? ? Min 3X/week ? ? ? ?  ?PT Plan Current plan remains appropriate  ? ? ?Co-evaluation   ?  ?  ?  ?  ? ?  ?AM-PAC PT "6 Clicks" Mobility   ?Outcome Measure ? Help needed turning from your back to your side while in a flat bed without using bedrails?: A Little ?Help needed moving from lying on your back to sitting on the side of a flat bed without using bedrails?: A Lot ?Help needed moving to and from a bed to a chair (including a wheelchair)?: A Lot ?Help needed standing up from a chair using your arms (e.g., wheelchair or bedside chair)?: A Lot ?Help needed to walk in hospital room?: Total ?Help needed climbing 3-5 steps with a railing? : Total ?6 Click Score: 11 ? ?  ?End of Session Equipment Utilized During Treatment: Gait belt ?Activity Tolerance: Patient limited by fatigue ?Patient left: with call bell/phone within reach;in bed;with nursing/sitter in room;with chair alarm set ?Nurse Communication: Mobility status ?PT Visit Diagnosis: Other abnormalities of gait and mobility (R26.89);Muscle weakness (generalized) (M62.81);Pain ?  ? ? ?Time: [id number] ?PT Time Calculation (min) (ACUTE ONLY): 29 min ? ?Charges:  $Therapeutic Exercise: 8-22  mins ?$Therapeutic Activity: 8-22 mins   ?Ramond Dial ?05/17/2021, 5:19 PM ? ?Mee Hives, PT PhD ?Acute Rehab Dept. Number: [facility] [id number] and [place] [phone] ? ? ?

## 2021-05-18 DIAGNOSIS — R6521 Severe sepsis with septic shock: Secondary | ICD-10-CM | POA: Diagnosis not present

## 2021-05-18 DIAGNOSIS — A419 Sepsis, unspecified organism: Secondary | ICD-10-CM | POA: Diagnosis not present

## 2021-05-18 NOTE — Progress Notes (Signed)
Dorina Hoyer to be D/C'd  per MD order.  Discussed with the patient and all questions fully answered. ? ?VSS, Skin clean, dry and intact without evidence of skin break down, no evidence of skin tears noted. ? ?IV catheter discontinued intact. Site without signs and symptoms of complications. Dressing and pressure applied. ? ?An After Visit Summary was printed and given to the patient. ? ?D/c education completed with patient/family including follow up instructions, medication list, d/c activities limitations if indicated, with other d/c instructions as indicated by MD - patient able to verbalize understanding, all questions fully answered.  ? ?Patient instructed to return to ED, call 911, or call MD for any changes in condition.  ? ?Patient to be escorted via WC, and D/C home via private auto.  ?

## 2021-05-18 NOTE — Progress Notes (Signed)
Occupational Therapy Treatment ?Patient Details ?Name: [name] ?MRN: [id number] ?DOB: [birth year] ?Today's Date: 05/18/2021 ? ? ?History of present illness Pt adm 4/17 with septic shock due to RLE cellulitis and PNA. Pt developed acute hypoxic respiratory failure and placed on bipap 4/19. Developed iatrogenic pneumothorax and chest tube placed. PMH - Multiple Sclerosis, seizure, ankylosing spondylitis, fibromyalgia ?  ?OT comments ? Session focused on education in preparation for planned discharge home today. Pt's husband plans to stay home to assist pt for > 2 months and opting for [facility] services rather than rehab facility stay. Educated re: DME safety, stair mgmt with w/c, ADL routine with energy conservation strategies, and caregiver body mechanics (gait belt provided). Pt and spouse verbalized understanding of education and eager to return home. Updated DC recs to reflect.  ? ?Recommendations for follow up therapy are one component of a multi-disciplinary discharge planning process, led by the attending physician.  Recommendations may be updated based on patient status, additional functional criteria and insurance authorization. ?   ?Follow Up Recommendations ? Home health OT (refusing SNF/LTACH rehab)  ?  ?Assistance Recommended at Discharge Frequent or constant Supervision/Assistance  ?Patient can return home with the following ? A lot of help with walking and/or transfers;A lot of help with bathing/dressing/bathroom ?  ?Equipment Recommendations ? Wheelchair (measurements OT);Wheelchair cushion (measurements OT) (declined hospital bed)  ?  ?Recommendations for Other Services   ? ?  ?Precautions / Restrictions Precautions ?Precautions: Fall ?Restrictions ?Weight Bearing Restrictions: No  ? ? ?  ? ?Mobility Bed Mobility ?Overal bed mobility: Needs Assistance ?Bed Mobility: Supine to Sit, Sit to Supine ?  ?  ?Supine to sit: Min assist ?Sit to supine: Min assist ?  ?General bed mobility comments: assisted from  flat bed, use of bedrails and handheld assist to lift trunk. Min A to get LE back into bed ?  ? ?Transfers ?  ?  ?  ?  ?  ?  ?  ?  ?  ?  ?  ?  ?Balance Overall balance assessment: Needs assistance ?Sitting-balance support: Feet supported ?Sitting balance-Leahy Scale: Fair ?Sitting balance - Comments: able to sit EOB without UE support but with fatigue, begins to rely on UE support ?  ?  ?  ?  ?  ?  ?  ?  ?  ?  ?  ?  ?  ?  ?  ?   ? ?ADL either performed or assessed with clinical judgement  ? ?ADL Overall ADL's : Needs assistance/impaired ?  ?  ?  ?  ?  ?  ?  ?  ?  ?  ?  ?  ?  ?  ?  ?  ?  ?  ?  ?General ADL Comments: Pt's husband present with focus on their plan to transition home instead of rehab including ADL routine (energy conservation; rec bathrobe for showering tasks to allow rest break/warmth prior to getting dressed), body mechanics for transfers with gait belt provided, stair mgmt with w/c (son and husband to assist; one in front and one in back), bed mobility (rec bedrails if difficulty getting in/out of bed) and w/c mgmt. ?  ? ?Extremity/Trunk Assessment Upper Extremity Assessment ?Upper Extremity Assessment: Generalized weakness ?  ?Lower Extremity Assessment ?Lower Extremity Assessment: Defer to PT evaluation ?  ?  ?  ? ?Vision   ?Vision Assessment?: No apparent visual deficits ?  ?Perception   ?  ?Praxis   ?  ? ?Cognition Arousal/Alertness: Awake/alert ?Behavior  During Therapy: WFL for tasks assessed/performed, Restless ?Overall Cognitive Status: Impaired/Different from baseline ?Area of Impairment: Awareness, Attention ?  ?  ?  ?  ?  ?  ?  ?  ?  ?Current Attention Level: Selective ?  ?  ?  ?Awareness: Emergent ?  ?General Comments: restless at times and benefits from cues for redirection. appears to be returning to baseline, much improved anxiety with movement ?  ?  ?   ?Exercises   ? ?  ?Shoulder Instructions   ? ? ?  ?General Comments    ? ? ?Pertinent Vitals/ Pain       Pain Assessment ?Pain  Assessment: Faces ?Faces Pain Scale: Hurts a little bit ?Pain Location: back ?Pain Descriptors / Indicators: Sore ?Pain Intervention(s): Monitored during session ? ?Home Living   ?  ?  ?  ?  ?  ?  ?  ?  ?  ?  ?  ?  ?  ?  ?  ?  ?  ?  ? ?  ?Prior Functioning/Environment    ?  ?  ?  ?   ? ?Frequency ? Min 2X/week  ? ? ? ? ?  ?Progress Toward Goals ? ?OT Goals(current goals can now be found in the care plan section) ? Progress towards OT goals: Progressing toward goals ? ?Acute Rehab OT Goals ?Patient Stated Goal: go home with husband ?OT Goal Formulation: With patient ?Time For Goal Achievement: 06/01/21 ?Potential to Achieve Goals: Good  ?Plan Discharge plan needs to be updated   ? ?Co-evaluation ? ? ?   ?  ?  ?  ?  ? ?  ?AM-PAC OT "6 Clicks" Daily Activity     ?Outcome Measure ? ? Help from another person eating meals?: None ?Help from another person taking care of personal grooming?: A Little ?Help from another person toileting, which includes using toliet, bedpan, or urinal?: A Lot ?Help from another person bathing (including washing, rinsing, drying)?: A Lot ?Help from another person to put on and taking off regular upper body clothing?: A Little ?Help from another person to put on and taking off regular lower body clothing?: A Lot ?6 Click Score: 16 ? ?  ?End of Session   ? ?OT Visit Diagnosis: Unsteadiness on feet (R26.81);Other abnormalities of gait and mobility (R26.89);Muscle weakness (generalized) (M62.81) ?  ?Activity Tolerance Patient tolerated treatment well;Patient limited by fatigue ?  ?Patient Left in bed;with call bell/phone within reach;with family/visitor present;with nursing/sitter in room ?  ?Nurse Communication Mobility status (RN present) ?  ? ?   ? ?Time: 7619-5093 ?OT Time Calculation (min): 27 min ? ?Charges: OT General Charges ?$OT Visit: 1 Visit ?OT Treatments ?$Self Care/Home Management : 23-37 mins ? ?Bradd Canary, OTR/L ?Acute Rehab Services ?Office: [phone]  ? ?Lorre Munroe ?05/18/2021,  10:29 AM ?

## 2021-05-18 NOTE — Discharge Summary (Signed)
Physician Discharge Summary  ?[name] MRN:[id number] DOB: 02/21/1960 DOA: 04/26/2021 ? ?PCP: [facility] ? ?Admit date: 04/26/2021 ?Discharge date: 05/18/2021 ? ?Admitted From: Home ?Disposition:  Home ? ?Discharge Condition:Stable ?CODE STATUS:FULL ?Diet recommendation: Dysphagia 3 diet ? ?Brief/Interim Summary: ? ?Patient is a 61 year old  female with history of multiple sclerosis ambulatory with walker, ankylosing spondylitis, GERD, seizure disorder, fibromyalgia who presented here with complaints of increased right lower extremity swelling, scaling, progressive weakness, poor oral intake.  On vent since she was hypertensive, tachycardic, encephalopathic.  Lab work showed elevated leukocytes, elevated creatinine in the range of 7, hypokalemia.  Patient was admitted under PCCM service for management of septic shock secondary to cellulitis of right lower extremity, started on broad-spectrum antibiotics.  Hospital course remarkable for hypoxia.  Imaging showed bilateral pneumothorax, status post chest tube placement and removal.  She was also treated for aspiration pneumonia during this hospitalization.  PT/OT recommending SNF on discharge but patient is interested in home health.  Medically stable for discharge. ? ?Following problems were addressed during her hospitalization: ? ?Septic shock secondary to right lower extremity cellulitis: Presented with weakness, cellulitis of right lower extremity.  Found to be hypotensive on presentation, with leukocytosis, AKI, hyperkalemia, encephalopathy, hypoxic.  Initially required vasopressors.  Sepsis physiology has resolved.  Finished  antibiotics for aspiration pneumonia. ?  ?Bilateral pneumothorax: Due to RIJ central line complication , pigtail failed to fix it and then she got an intraparenchymal large bore chest tube.Status post bilateral chest tubes placement ,now removed.  CT surgery was following.    On room air now ?  ?AKI/hyperkalemia:  Presented with severe AKI.  Kidney function has normalized.  Nephrology was  following. ?  ?History of COPD/tobacco use: Currently not in exacerbation.  Follows with Dr. Wert.  She also has history of left upper lobe cavitary lesion. We recommend to follow-up with pulmonology as an outpatient. ?  ?Acute metabolic encephalopathy/delirium: Resolved ?  ?Multiple sclerosis/ankylosing spondylitis: At baseline, she is able to walk with the help of walker. On ocrelizumab for MS as outpatient therapy.Follows with rheumatology.  Presented with weakness.  Currently deconditioned.  PT/OT recommending LTAC /SNF on DC.  Patient prefers home health. ?  ?Dysphagia: Minimal intake during this hospitalization so tube feeding was started,now removed.   Speech therapy were closely following.  Recommended dysphagia 3 diet  ?  ?Normocytic anemia: Most likely secondary to acute illness.  Currently hemoglobin stable in the range of 8-9.  Continue to monitor ?  ?Hyperglycemia: Hemoglobin A1c of 5.  She was started on sliding scale insulin for hyperglycemia.  Currently stable ?  ?Stage II sacral ulcer/chronic venous stasis changes/superficial lower extremity ulcers: Continue protective foam pads, continue pressure offloading.  Continue wound care.  She will be followed by home health care nurse ?  ?Debility/deconditioning: PT/OT recommending LTACH/skilled nursing facility on discharge.She lives with her family, ambulates with the help of walker.  Patient is interested in home health. ?  ?  ? ? ? ?Discharge Diagnoses:  ?Principal Problem: ?  Septic shock due to RLE cellulitis in immunocompromised patient ?Active Problems: ?  Bilateral pneumothorax ?  Acute respiratory failure with hypoxia (HCC) ?  MS (multiple sclerosis) (HCC) ?  AKI (acute kidney injury) (HCC) ?  Acute metabolic encephalopathy ?  Dysphagia ?  Physical deconditioning ?  Seizure disorder (HCC) ?  Normocytic anemia ?  GERD (gastroesophageal reflux disease) ?  HLA B27 (HLA B27  positive) ?  Pulmonary nodule ?    Cellulitis of both lower extremities ?  High anion gap metabolic acidosis ?  Hyperkalemia ?  Pressure injury of skin ? ? ? ?Discharge Instructions ? ?Discharge Instructions   ? ? Diet general   Complete by: As directed ?  ? Dysphagia 3 diet  ? Discharge instructions   Complete by: As directed ?  ? 1)Please follow up with your PCP in a week ?2)Follow up with Home health ?3)Follow up with your neurologist, rheumatologist  ? Discharge wound care:   Complete by: As directed ?  ? As per wound care nurse  ? Increase activity slowly   Complete by: As directed ?  ? ?  ? ?Allergies as of 05/18/2021   ? ?   Reactions  ? Lamictal [lamotrigine] Rash  ? Other   ? Cats: Rash  ?Shrimp scampi sauce: rash  ? Tramadol   ? Lowers seizure threshold  ? ?  ? ?  ?Medication List  ?  ? ?STOP taking these medications   ? ?losartan 25 MG tablet ?Commonly known as: COZAAR ?  ? ?  ? ?TAKE these medications   ? ?gabapentin 300 MG capsule ?Commonly known as: NEURONTIN ?Take 300 mg by mouth 2 (two) times daily. ?  ?glycopyrrolate 1 MG tablet ?Commonly known as: ROBINUL ?Take 1 mg by mouth 2 (two) times daily. ?  ?hydrOXYzine 25 MG tablet ?Commonly known as: ATARAX ?Take 25 mg by mouth 2 (two) times daily as needed for anxiety. ?  ?ibuprofen 200 MG tablet ?Commonly known as: ADVIL ?Take 600 mg by mouth every 6 (six) hours as needed for moderate pain. ?  ?levETIRAcetam 500 MG tablet ?Commonly known as: KEPPRA ?Take 1 tablet (500 mg total) by mouth 2 (two) times daily. ?  ?QC TUMERIC COMPLEX PO ?Take 475 mg by mouth daily. ?  ?Vitamin D3 25 MCG (1000 UT) Caps ?Take 1,000 Units by mouth daily. ?  ? ?  ? ?  ?  ? ? ?  ?Durable Medical Equipment  ?(From admission, onward)  ?  ? ? ?  ? ?  Start     Ordered  ? 05/17/21 1348  For home use only DME standard manual wheelchair with seat cushion  Once       ?Comments: Patient suffers from MS,  Septic shock due to RLE cellulitis in immunocompromised patient  which impairs their  ability to perform daily activities like ambulating  in the home.  A cane  will not resolve issue with performing activities of daily living. A wheelchair will allow patient to safely perform daily activities. Patient can safely propel the wheelchair in the home or has a caregiver who can provide assistance. Length of need lifetime . ?Accessories: elevating leg rests (ELRs), wheel locks, extensions and anti-tippers. ? ?Seat and back cushions  ? 05/17/21 1348  ? ?  ?  ? ?  ? ? ?  ?Discharge Care Instructions  ?(From admission, onward)  ?  ? ? ?  ? ?  Start     Ordered  ? 05/18/21 0000  Discharge wound care:       ?Comments: As per wound care nurse  ? 05/18/21 1046  ? ?  ?  ? ?  ? ? Follow-up Information   ? ? [facility] up.   ?Specialty: Home Health Services ?Contact information: ?[address] ?[address] ?[place] [address] ?[phone] ? ? ?  ?  ? ? [facility]. Schedule an appointment as soon as   possible for a visit in 1 week(s).   ?Specialty: Rheumatology ?Contact information: ?[address] ?[place]  [address] ?[phone] ? ? ?  ?  ? ?  ?  ? ?  ? ?Allergies  ?Allergen Reactions  ? Lamictal [Lamotrigine] Rash  ? Other   ?  Cats: Rash  ?Shrimp scampi sauce: rash  ? Tramadol   ?  Lowers seizure threshold  ? ? ?Consultations: ?PCCM ? ? ?Procedures/Studies: ?DG Chest 1 View ? ?Result Date: 05/06/2021 ?CLINICAL DATA:  Right pneumothorax EXAM: CHEST  1 VIEW COMPARISON:  Previous studies including the examination done earlier today FINDINGS: There is interval placement of a larger caliber third right chest tube. There is almost complete resolution of left pneumothorax. There is possible minimal left apical pneumothorax. There is increase in amount of subcutaneous emphysema in the right chest wall. Increased density in the left lower lung fields obscuring left hemidiaphragm may suggest pleural effusion and atelectasis/pneumonia. Enteric tube is noted traversing the  esophagus. IMPRESSION: There is almost complete clearing of right pneumothorax after placement of third right chest tube.No significant interval changes are noted in the lung fields. Electronically Signed   By:

## 2021-05-19 DIAGNOSIS — R1310 Dysphagia, unspecified: Secondary | ICD-10-CM | POA: Diagnosis not present

## 2021-05-19 DIAGNOSIS — R5381 Other malaise: Secondary | ICD-10-CM | POA: Diagnosis not present

## 2021-05-19 DIAGNOSIS — J9601 Acute respiratory failure with hypoxia: Secondary | ICD-10-CM | POA: Diagnosis not present

## 2021-05-19 DIAGNOSIS — A419 Sepsis, unspecified organism: Secondary | ICD-10-CM | POA: Diagnosis not present

## 2021-05-25 ENCOUNTER — Other Ambulatory Visit: Payer: Self-pay

## 2021-05-25 ENCOUNTER — Encounter (HOSPITAL_COMMUNITY): Payer: Self-pay | Admitting: Emergency Medicine

## 2021-05-25 ENCOUNTER — Inpatient Hospital Stay (HOSPITAL_COMMUNITY)
Admission: EM | Admit: 2021-05-25 | Discharge: 2021-06-03 | DRG: 871 | Disposition: A | Discharge status: Home Health | Payer: Medicare Other | Attending: Family Medicine | Admitting: Family Medicine

## 2021-05-25 ENCOUNTER — Emergency Department (HOSPITAL_BASED_OUTPATIENT_CLINIC_OR_DEPARTMENT_OTHER): Payer: Medicare Other

## 2021-05-25 ENCOUNTER — Emergency Department (HOSPITAL_COMMUNITY): Payer: Medicare Other

## 2021-05-25 DIAGNOSIS — M797 Fibromyalgia: Secondary | ICD-10-CM | POA: Diagnosis present

## 2021-05-25 DIAGNOSIS — F17210 Nicotine dependence, cigarettes, uncomplicated: Secondary | ICD-10-CM | POA: Diagnosis present

## 2021-05-25 DIAGNOSIS — Z90711 Acquired absence of uterus with remaining cervical stump: Secondary | ICD-10-CM

## 2021-05-25 DIAGNOSIS — L538 Other specified erythematous conditions: Secondary | ICD-10-CM | POA: Diagnosis not present

## 2021-05-25 DIAGNOSIS — L89323 Pressure ulcer of left buttock, stage 3: Secondary | ICD-10-CM | POA: Diagnosis present

## 2021-05-25 DIAGNOSIS — Z20822 Contact with and (suspected) exposure to covid-19: Secondary | ICD-10-CM | POA: Diagnosis present

## 2021-05-25 DIAGNOSIS — L89153 Pressure ulcer of sacral region, stage 3: Secondary | ICD-10-CM | POA: Diagnosis not present

## 2021-05-25 DIAGNOSIS — N390 Urinary tract infection, site not specified: Secondary | ICD-10-CM

## 2021-05-25 DIAGNOSIS — Z6822 Body mass index (BMI) 22.0-22.9, adult: Secondary | ICD-10-CM

## 2021-05-25 DIAGNOSIS — E43 Unspecified severe protein-calorie malnutrition: Secondary | ICD-10-CM | POA: Diagnosis not present

## 2021-05-25 DIAGNOSIS — M7989 Other specified soft tissue disorders: Secondary | ICD-10-CM

## 2021-05-25 DIAGNOSIS — K219 Gastro-esophageal reflux disease without esophagitis: Secondary | ICD-10-CM | POA: Diagnosis present

## 2021-05-25 DIAGNOSIS — I959 Hypotension, unspecified: Secondary | ICD-10-CM | POA: Diagnosis not present

## 2021-05-25 DIAGNOSIS — D649 Anemia, unspecified: Secondary | ICD-10-CM | POA: Diagnosis not present

## 2021-05-25 DIAGNOSIS — E861 Hypovolemia: Secondary | ICD-10-CM | POA: Diagnosis not present

## 2021-05-25 DIAGNOSIS — J449 Chronic obstructive pulmonary disease, unspecified: Secondary | ICD-10-CM | POA: Diagnosis present

## 2021-05-25 DIAGNOSIS — R159 Full incontinence of feces: Secondary | ICD-10-CM | POA: Diagnosis not present

## 2021-05-25 DIAGNOSIS — J3081 Allergic rhinitis due to animal (cat) (dog) hair and dander: Secondary | ICD-10-CM | POA: Diagnosis present

## 2021-05-25 DIAGNOSIS — A419 Sepsis, unspecified organism: Principal | ICD-10-CM

## 2021-05-25 DIAGNOSIS — I10 Essential (primary) hypertension: Secondary | ICD-10-CM | POA: Diagnosis present

## 2021-05-25 DIAGNOSIS — Z888 Allergy status to other drugs, medicaments and biological substances status: Secondary | ICD-10-CM

## 2021-05-25 DIAGNOSIS — Z9049 Acquired absence of other specified parts of digestive tract: Secondary | ICD-10-CM

## 2021-05-25 DIAGNOSIS — K580 Irritable bowel syndrome with diarrhea: Secondary | ICD-10-CM | POA: Diagnosis present

## 2021-05-25 DIAGNOSIS — E8729 Other acidosis: Secondary | ICD-10-CM

## 2021-05-25 DIAGNOSIS — R6520 Severe sepsis without septic shock: Secondary | ICD-10-CM | POA: Diagnosis not present

## 2021-05-25 DIAGNOSIS — N179 Acute kidney failure, unspecified: Secondary | ICD-10-CM

## 2021-05-25 DIAGNOSIS — L89159 Pressure ulcer of sacral region, unspecified stage: Secondary | ICD-10-CM | POA: Diagnosis not present

## 2021-05-25 DIAGNOSIS — Z8249 Family history of ischemic heart disease and other diseases of the circulatory system: Secondary | ICD-10-CM

## 2021-05-25 DIAGNOSIS — E876 Hypokalemia: Secondary | ICD-10-CM | POA: Diagnosis not present

## 2021-05-25 DIAGNOSIS — Z8719 Personal history of other diseases of the digestive system: Secondary | ICD-10-CM

## 2021-05-25 DIAGNOSIS — R531 Weakness: Secondary | ICD-10-CM | POA: Diagnosis not present

## 2021-05-25 DIAGNOSIS — Z86010 Personal history of colonic polyps: Secondary | ICD-10-CM

## 2021-05-25 DIAGNOSIS — N281 Cyst of kidney, acquired: Secondary | ICD-10-CM | POA: Diagnosis not present

## 2021-05-25 DIAGNOSIS — M79604 Pain in right leg: Secondary | ICD-10-CM

## 2021-05-25 DIAGNOSIS — L03116 Cellulitis of left lower limb: Secondary | ICD-10-CM | POA: Diagnosis not present

## 2021-05-25 DIAGNOSIS — R0902 Hypoxemia: Secondary | ICD-10-CM | POA: Diagnosis not present

## 2021-05-25 DIAGNOSIS — L309 Dermatitis, unspecified: Secondary | ICD-10-CM | POA: Diagnosis present

## 2021-05-25 DIAGNOSIS — R319 Hematuria, unspecified: Secondary | ICD-10-CM | POA: Diagnosis present

## 2021-05-25 DIAGNOSIS — E8809 Other disorders of plasma-protein metabolism, not elsewhere classified: Secondary | ICD-10-CM | POA: Diagnosis present

## 2021-05-25 DIAGNOSIS — E871 Hypo-osmolality and hyponatremia: Secondary | ICD-10-CM | POA: Diagnosis not present

## 2021-05-25 DIAGNOSIS — E875 Hyperkalemia: Secondary | ICD-10-CM | POA: Diagnosis not present

## 2021-05-25 DIAGNOSIS — E872 Acidosis, unspecified: Secondary | ICD-10-CM | POA: Diagnosis not present

## 2021-05-25 DIAGNOSIS — F419 Anxiety disorder, unspecified: Secondary | ICD-10-CM | POA: Diagnosis present

## 2021-05-25 DIAGNOSIS — G35 Multiple sclerosis: Secondary | ICD-10-CM | POA: Diagnosis present

## 2021-05-25 DIAGNOSIS — N19 Unspecified kidney failure: Secondary | ICD-10-CM

## 2021-05-25 DIAGNOSIS — L03115 Cellulitis of right lower limb: Secondary | ICD-10-CM | POA: Diagnosis present

## 2021-05-25 DIAGNOSIS — G40909 Epilepsy, unspecified, not intractable, without status epilepticus: Secondary | ICD-10-CM | POA: Diagnosis present

## 2021-05-25 DIAGNOSIS — R739 Hyperglycemia, unspecified: Secondary | ICD-10-CM | POA: Diagnosis not present

## 2021-05-25 DIAGNOSIS — R6521 Severe sepsis with septic shock: Secondary | ICD-10-CM | POA: Diagnosis not present

## 2021-05-25 DIAGNOSIS — M79605 Pain in left leg: Secondary | ICD-10-CM | POA: Diagnosis not present

## 2021-05-25 DIAGNOSIS — L8990 Pressure ulcer of unspecified site, unspecified stage: Secondary | ICD-10-CM

## 2021-05-25 DIAGNOSIS — Z4682 Encounter for fitting and adjustment of non-vascular catheter: Secondary | ICD-10-CM | POA: Diagnosis not present

## 2021-05-25 DIAGNOSIS — R000 Tachycardia, unspecified: Secondary | ICD-10-CM | POA: Diagnosis not present

## 2021-05-25 DIAGNOSIS — L0390 Cellulitis, unspecified: Secondary | ICD-10-CM | POA: Diagnosis not present

## 2021-05-25 DIAGNOSIS — L03119 Cellulitis of unspecified part of limb: Secondary | ICD-10-CM

## 2021-05-25 DIAGNOSIS — Z91018 Allergy to other foods: Secondary | ICD-10-CM

## 2021-05-25 DIAGNOSIS — Z7401 Bed confinement status: Secondary | ICD-10-CM | POA: Diagnosis not present

## 2021-05-25 DIAGNOSIS — M459 Ankylosing spondylitis of unspecified sites in spine: Secondary | ICD-10-CM | POA: Diagnosis not present

## 2021-05-25 DIAGNOSIS — Z9851 Tubal ligation status: Secondary | ICD-10-CM

## 2021-05-25 DIAGNOSIS — R9431 Abnormal electrocardiogram [ECG] [EKG]: Secondary | ICD-10-CM | POA: Diagnosis not present

## 2021-05-25 DIAGNOSIS — Z79899 Other long term (current) drug therapy: Secondary | ICD-10-CM

## 2021-05-25 DIAGNOSIS — E162 Hypoglycemia, unspecified: Secondary | ICD-10-CM | POA: Diagnosis present

## 2021-05-25 LAB — CBC WITH DIFFERENTIAL/PLATELET
Abs Immature Granulocytes: 0.1 10*3/uL — ABNORMAL HIGH (ref 0.00–0.07)
Basophils Absolute: 0 10*3/uL (ref 0.0–0.1)
Basophils Relative: 0 %
Eosinophils Absolute: 0 10*3/uL (ref 0.0–0.5)
Eosinophils Relative: 0 %
HCT: 36.4 % (ref 36.0–46.0)
Hemoglobin: 12 g/dL (ref 12.0–15.0)
Immature Granulocytes: 1 %
Lymphocytes Relative: 2 %
Lymphs Abs: 0.3 10*3/uL — ABNORMAL LOW (ref 0.7–4.0)
MCH: 28.9 pg (ref 26.0–34.0)
MCHC: 33 g/dL (ref 30.0–36.0)
MCV: 87.7 fL (ref 80.0–100.0)
Monocytes Absolute: 1.2 10*3/uL — ABNORMAL HIGH (ref 0.1–1.0)
Monocytes Relative: 9 %
Neutro Abs: 12.2 10*3/uL — ABNORMAL HIGH (ref 1.7–7.7)
Neutrophils Relative %: 88 %
Platelets: 461 10*3/uL — ABNORMAL HIGH (ref 150–400)
RBC: 4.15 MIL/uL (ref 3.87–5.11)
RDW: 16.1 % — ABNORMAL HIGH (ref 11.5–15.5)
WBC: 13.9 10*3/uL — ABNORMAL HIGH (ref 4.0–10.5)
nRBC: 0 % (ref 0.0–0.2)

## 2021-05-25 LAB — COMPREHENSIVE METABOLIC PANEL
ALT: 13 U/L (ref 0–44)
AST: 12 U/L — ABNORMAL LOW (ref 15–41)
Albumin: 2.6 g/dL — ABNORMAL LOW (ref 3.5–5.0)
Alkaline Phosphatase: 80 U/L (ref 38–126)
Anion gap: 22 — ABNORMAL HIGH (ref 5–15)
BUN: 125 mg/dL — ABNORMAL HIGH (ref 8–23)
CO2: 13 mmol/L — ABNORMAL LOW (ref 22–32)
Calcium: 7.4 mg/dL — ABNORMAL LOW (ref 8.9–10.3)
Chloride: 93 mmol/L — ABNORMAL LOW (ref 98–111)
Creatinine, Ser: 2.7 mg/dL — ABNORMAL HIGH (ref 0.44–1.00)
GFR, Estimated: 19 mL/min — ABNORMAL LOW (ref >60)
Glucose, Bld: 76 mg/dL (ref 70–99)
Potassium: 5.3 mmol/L — ABNORMAL HIGH (ref 3.5–5.1)
Sodium: 128 mmol/L — ABNORMAL LOW (ref 135–145)
Total Bilirubin: 0.8 mg/dL (ref 0.3–1.2)
Total Protein: 5.1 g/dL — ABNORMAL LOW (ref 6.5–8.1)

## 2021-05-25 LAB — LACTIC ACID, PLASMA
Lactic Acid, Venous: 1.2 mmol/L (ref 0.5–1.9)
Lactic Acid, Venous: 1.1 mmol/L (ref 0.5–1.9)

## 2021-05-25 MED ORDER — LACTATED RINGERS IV BOLUS
500.0000 mL | Freq: Once | INTRAVENOUS | Status: AC
  Administered 2021-05-25: 500 mL via INTRAVENOUS

## 2021-05-25 MED ORDER — VANCOMYCIN HCL IN DEXTROSE 1-5 GM/200ML-% IV SOLN
1000.0000 mg | Freq: Once | INTRAVENOUS | Status: AC
  Administered 2021-05-25: 1000 mg via INTRAVENOUS
  Filled 2021-05-25: qty 200

## 2021-05-25 MED ORDER — SODIUM ZIRCONIUM CYCLOSILICATE 10 G PO PACK
10.0000 g | PACK | Freq: Once | ORAL | Status: AC
  Administered 2021-05-25: 10 g via ORAL
  Filled 2021-05-25: qty 1

## 2021-05-25 MED ORDER — SODIUM ZIRCONIUM CYCLOSILICATE 10 G PO PACK: 10.0000 g | PACK | Freq: Every day | ORAL | Status: DC

## 2021-05-25 MED ORDER — SODIUM CHLORIDE 0.9 % IV SOLN
2.0000 g | Freq: Once | INTRAVENOUS | Status: AC
  Administered 2021-05-25: 2 g via INTRAVENOUS
  Filled 2021-05-25: qty 12.5

## 2021-05-25 MED ORDER — LACTATED RINGERS IV SOLN: INTRAVENOUS | Status: DC

## 2021-05-25 MED ORDER — LACTATED RINGERS IV BOLUS
1000.0000 mL | Freq: Once | INTRAVENOUS | Status: AC
  Administered 2021-05-25: 1000 mL via INTRAVENOUS

## 2021-05-25 MED ORDER — NOREPINEPHRINE 4 MG/250ML-% IV SOLN: 0.0000 ug/min | INTRAVENOUS | Status: DC

## 2021-05-25 NOTE — ED Triage Notes (Addendum)
Pt BIB [facility] with reports of worsening cellulitis on bilateral lower legs. Per pt she was told to "go to the hospital" due to the erythema of the leg wounds. Pt afebrile. Per Ems pt BP 70s systolic. Pt given bolus.  ?

## 2021-05-25 NOTE — Consult Note (Signed)
Nephrology Consult  ? ?Assessment/Recommendations:  ? ?AKI, likely secondary to sepsis/hypotension in the context of NSAID use ?-b/l cr seems to be around 0.5 ?-would recommend fluid resuscitation per sepsis protocol for now. Keep MAP >65, may need pressor support if fails to improve with fluids ?-UA pending- correlate/treat for possible UTI given u/s findings ?-based on exam, no indications for renal replacement therapy at this time but can consider this if she fails to improve from a renal perspective. Discussed this with patient ?-Maintain MAP>65 for optimal renal perfusion.  ?-Avoid nephrotoxic medications including NSAIDs and iodinated intravenous contrast exposure unless the latter is absolutely indicated.  Preferred narcotic agents for pain control are hydromorphone, fentanyl, and methadone. Morphine should not be used. Avoid Baclofen and avoid oral sodium phosphate and magnesium citrate based laxatives / bowel preps. Continue strict Input and Output monitoring, daily weights. Will monitor the patient closely with you and intervene or adjust therapy as indicated by changes in clinical status/labs  ?-Continue to monitor daily Cr, Dose meds for GFR<15 ? ?Hyperkalemia, mild ?-s/p lokelma x 1 dose, redose if hyperK again ? ?Hyponatremia ?-likely related to AKI, f/u on labs post isotonic fluid ? ?Anion gap metabolic acidosis ?-lactate wnl ?-likely related to AKI ?-receiving LR, if no improvement then would switch to bicarb based fluids ? ?Septic shock secondary to LE cellulitis ?-abx per primary service. Doppler neg for DVT ?-as above, would recommend checking UA and ruling out UTI ? ?Hypotension ?-receiving fluids, may need pressor support--per primary service ? ?[name] Candiss Norse ?[facility] ?05/25/2021 ?10:42 PM ? ? ?_____________________________________________________________________________________ ? ? ?History of Present Illness: [name] is a/an 61 y.o. female with a past medical history of  MS, ankylosing spondylitis, GERD, seizure disorder, fibromyalgia, recent admission for septic shock secondary to right lower extremity cellulitis/AKI/hyperkalemia/encephalopathy/hypoxia who presents to [facility] with worsening bilateral lower extremity cellulitis.  Was brought in by EMS.  Per EMS, was found to have blood pressure in the 37D systolic.  Was found to have AKI with a BUN of 125, creatinine 2.7, potassium 5.3, sodium 128, bicarb 13 with anion gap of 22, hemoglobin 12, white count 13.9.  Lactate normal.  Renal ultrasound performed in ER and there were changes suspicious for underlying UTI (well distended bladder with dependent echogenic material which may be reflective of underlying UTI) and mild fullness of the left renal pelvis without definitive calyceal dilatation.  There is cortical thinning bilaterally. ?Was recently discharged on 5/9 for septic shock secondary to right lower extremity cellulitis.  She did have AKI at that time which resolved with IV hydration.  Her creatinine was 0.73 on 05/15/2021. ?Patient does report that she took about 9-12 ibuprofen tablets since leaving the hospital a week ago. She does report that her appetite was poor at home due to lack of taste. She also reports that she felt like she had a UTI prior to leaving the hospital.  She otherwise denies any chest pain, shortness of breath, brain fog, new shakes/tremors, hiccups/pruritus, nausea/vomiting. ?In the ER she received Lokelma 10 g x 1 dose, LR 1.5 L bolus with 125 cc/h thereafter.  She has received cefepime and vancomycin. ? ?Medications:  ?Current Facility-Administered Medications  ?Medication Dose Route Frequency Provider Last Rate Last Admin  ? lactated ringers infusion   Intravenous Continuous Regan Lemming, MD 125 mL/hr at 05/25/21 2026 New Bag at 05/25/21 2026  ? ?Current Outpatient Medications  ?Medication Sig Dispense Refill  ? Cholecalciferol (VITAMIN D3) 25 MCG (1000 UT) CAPS Take  1,000 Units by mouth daily.    ?  gabapentin (NEURONTIN) 300 MG capsule Take 300 mg by mouth 2 (two) times daily.    ? hydrOXYzine (ATARAX) 25 MG tablet Take 25 mg by mouth 2 (two) times daily as needed for anxiety.    ? ibuprofen (ADVIL,MOTRIN) 200 MG tablet Take 600 mg by mouth every 6 (six) hours as needed for moderate pain.    ? levETIRAcetam (KEPPRA) 500 MG tablet Take 1 tablet (500 mg total) by mouth 2 (two) times daily. 60 tablet 12  ? Omega-3 Fatty Acids (FISH OIL) 1000 MG CAPS Take 1,000 capsules by mouth daily.    ? Turmeric (QC TUMERIC COMPLEX PO) Take 475 mg by mouth daily.    ?  ? ?ALLERGIES ?Lamictal [lamotrigine], Other, and Tramadol ? ?MEDICAL HISTORY ?Past Medical History:  ?Diagnosis Date  ? Allergy   ? seasonal  ? Anemia   ? GERD (gastroesophageal reflux disease)   ? HLA B27 (HLA B27 positive)   ? Hx: UTI (urinary tract infection)   ? Hyperplastic colon polyp   ? Hypertension   ? IBS (irritable bowel syndrome)   ? Multiple sclerosis ([place])   ? Seizures ([place])   ? hx epilepsy-last seizure 9/14 after taking tramadol  ?  ? ?SOCIAL HISTORY ?Social History  ? ?Socioeconomic History  ? Marital status: Married  ?  Spouse name: Jori Moll  ? Number of children: 3  ? Years of education: 9  ? Highest education level: Not on file  ?Occupational History  ? Occupation: Mortage   ?  Employer: [employer]  ?Tobacco Use  ? Smoking status: Every Day  ?  Packs/day: 1.00  ?  Years: 30.00  ?  Pack years: 30.00  ?  Types: Cigarettes  ? Smokeless tobacco: Never  ? Tobacco comments:  ?  1 ppd  ?Substance and Sexual Activity  ? Alcohol use: Yes  ?  Alcohol/week: 1.0 standard drink  ?  Types: 1 Glasses of wine per week  ?  Comment: OCC.  ? Drug use: No  ? Sexual activity: Not on file  ?Other Topics Concern  ? Not on file  ?Social History Narrative  ? 2 caffeine drinks daily . Patient lives at home with her husband Jori Moll) and her two children.   ? Patient has three children.  ? Patient has some a high school education and took some college courses.   ? Right handed.  ? ?Social Determinants of Health  ? ?Financial Resource Strain: Not on file  ?Food Insecurity: Not on file  ?Transportation Needs: Not on file  ?Physical Activity: Not on file  ?Stress: Not on file  ?Social Connections: Not on file  ?Intimate Partner Violence: Not on file  ?  ? ?FAMILY HISTORY ?Family History  ?Problem Relation Age of Onset  ? Heart disease Father   ? Breast cancer Maternal Grandmother   ? Diabetes Maternal Aunt   ? Colon cancer Neg Hx   ? Irritable bowel syndrome Sister   ? Irritable bowel syndrome Brother   ?  ? ?Review of Systems: ?12 systems reviewed ?Otherwise as per HPI, all other systems reviewed and negative ? ?Physical Exam: ?Vitals:  ? 05/25/21 2145 05/25/21 2230  ?BP: (!) 101/53 (!) 89/53  ?Pulse: 89 87  ?Resp: 19 14  ?Temp:    ?SpO2: 99% 99%  ? ?Total I/O ?In: 1506.4 [IV Piggyback:1506.4] ?Out: -  ? ?Intake/Output Summary (Last 24 hours) at 05/25/2021 2242 ?Last data filed at  05/25/2021 2232 ?Gross per 24 hour  ?Intake 1506.35 ml  ?Output --  ?Net 1506.35 ml  ? ?General: no acute distress ?HEENT: anicteric sclera, oropharynx clear without lesions, dry MMM ?CV: regular rate, normal rhythm, no murmurs, no gallops, no rubs ?Lungs: clear to auscultation bilaterally, normal work of breathing ?Abd: soft, non-tender, non-distended ?Skin/Ext: erythema w/ wounds b/l LE's, sensitive&hot to touch, b/l LE edema ?Psych: alert, engaged, appropriate mood and affect ?Neuro: normal speech, no gross focal deficits, no asterixis ? ?Test Results ?Reviewed ?Lab Results  ?Component Value Date  ? NA 128 (L) 05/25/2021  ? K 5.3 (H) 05/25/2021  ? CL 93 (L) 05/25/2021  ? CO2 13 (L) 05/25/2021  ? BUN 125 (H) 05/25/2021  ? CREATININE 2.70 (H) 05/25/2021  ? CALCIUM 7.4 (L) 05/25/2021  ? ALBUMIN 2.6 (L) 05/25/2021  ? PHOS 5.9 (H) 05/15/2021  ? ? ? ?I have reviewed all relevant outside healthcare records related to the patient's kidney injury.  ? ?

## 2021-05-25 NOTE — ED Provider Notes (Signed)
?[facility] EMERGENCY DEPARTMENT ?Provider Note ? ? ?CSN: [id number] ?Arrival date & time: 05/25/21  1521 ? ?  ? ?History ? ?Chief Complaint  ?Patient presents with  ? Wound Infection  ? ? ?[name] is a 61 y.o. female. ? ?HPI ? ?  61 year old  female with history of multiple sclerosis ambulatory with walker, ankylosing spondylitis, GERD, seizure disorder, fibromyalgia, recent admission for sepsis secondary to lower extremity cellulitis who presents with concern for worsening lower extremity cellulitis.  ? ?The patient was just admitted to the hospital from 04/26/2021 to 05/18/2021 due to right lower extremity swelling, progressive weakness, poor oral intake, was found to have septic shock secondary to a right lower extremity cellulitis, treated with IV antibiotics.  Her hospitalization was also complicated by bilateral pneumothoraces secondary to right IJ central line complication, status post chest tube placement, subsequently resolved. ? ?The patient states that since discharge, she has had worsening lower extremity swelling.  She endorses pain in her lower extremities and redness which has progressed over the past day.  Pain is moderate in intensity.  She has not been on antibiotics outpatient.  She denies any fevers or chills.  She denies any cough, chest pain, shortness of breath, abdominal pain.  She is on immunosuppressive medications due to her MS and ankylosing spondylitis. ? ?Home Medications ?Prior to Admission medications   ?Medication Sig Start Date End Date Taking? Authorizing Provider  ?Cholecalciferol (VITAMIN D3) 25 MCG (1000 UT) CAPS Take 1,000 Units by mouth daily.   Yes [provider]  ?gabapentin (NEURONTIN) 300 MG capsule Take 300 mg by mouth 2 (two) times daily. 02/15/21  Yes [provider]  ?hydrOXYzine (ATARAX) 25 MG tablet Take 25 mg by mouth 2 (two) times daily as needed for anxiety. 04/01/21  Yes [provider]  ?ibuprofen (ADVIL,MOTRIN)  200 MG tablet Take 600 mg by mouth every 6 (six) hours as needed for moderate pain.   Yes [provider]  ?levETIRAcetam (KEPPRA) 500 MG tablet Take 1 tablet (500 mg total) by mouth 2 (two) times daily. 10/19/12  Yes Levert Feinstein, MD  ?Omega-3 Fatty Acids (FISH OIL) 1000 MG CAPS Take 1,000 capsules by mouth daily.   Yes [provider]  ?Turmeric (QC TUMERIC COMPLEX PO) Take 475 mg by mouth daily.   Yes [provider]  ?   ? ?Allergies    ?Lamictal [lamotrigine], Other, and Tramadol   ? ?Review of Systems   ?Review of Systems  ?All other systems reviewed and are negative. ? ?Physical Exam ?Updated Vital Signs ?BP (!) 89/50   Pulse 92   Temp 97.8 ?F (36.6 ?C)   Resp 15   SpO2 99%  ?Physical Exam ?Vitals and nursing note reviewed.  ?Constitutional:   ?   General: She is not in acute distress. ?   Appearance: She is well-developed.  ?HENT:  ?   Head: Normocephalic and atraumatic.  ?Eyes:  ?   Conjunctiva/sclera: Conjunctivae normal.  ?Cardiovascular:  ?   Rate and Rhythm: Normal rate and regular rhythm.  ?Pulmonary:  ?   Effort: Pulmonary effort is normal. No respiratory distress.  ?   Breath sounds: Normal breath sounds.  ?Abdominal:  ?   Palpations: Abdomen is soft.  ?   Tenderness: There is no abdominal tenderness.  ?Musculoskeletal:     ?   General: Swelling and tenderness present.  ?   Cervical back: Neck supple.  ?   Comments: Tenderness, swelling,  right greater than left, ulcerative lesions present in the lower extremities, erythema, tenderness to palpation with 2+ to 3+ pitting edema in the right lower extremity, 1-2+ pitting edema in the left lower extremity  ?Skin: ?   Capillary Refill: Capillary refill takes less than 2 seconds.  ?   Findings: Lesion present.  ?   Comments: Erythema bilaterally in a stocking-like distribution in the lower extremities, superficial ulcerative lesion present in the right lower extremity  ?Neurological:  ?   General: No focal deficit present.  ?    Mental Status: She is alert and oriented to person, place, and time.  ?Psychiatric:     ?   Mood and Affect: Mood normal.  ? ? ?ED Results / Procedures / Treatments   ?Labs ?(all labs ordered are listed, but only abnormal results are displayed) ?Labs Reviewed  ?CBC WITH DIFFERENTIAL/PLATELET - Abnormal; Notable for the following components:  ?    Result Value  ? WBC 13.9 (*)   ? RDW 16.1 (*)   ? Platelets 461 (*)   ? Neutro Abs 12.2 (*)   ? Lymphs Abs 0.3 (*)   ? Monocytes Absolute 1.2 (*)   ? Abs Immature Granulocytes 0.10 (*)   ? All other components within normal limits  ?COMPREHENSIVE METABOLIC PANEL - Abnormal; Notable for the following components:  ? Sodium 128 (*)   ? Potassium 5.3 (*)   ? Chloride 93 (*)   ? CO2 13 (*)   ? BUN 125 (*)   ? Creatinine, Ser 2.70 (*)   ? Calcium 7.4 (*)   ? Total Protein 5.1 (*)   ? Albumin 2.6 (*)   ? AST 12 (*)   ? GFR, Estimated 19 (*)   ? Anion gap 22 (*)   ? All other components within normal limits  ?URINALYSIS, ROUTINE W REFLEX MICROSCOPIC - Abnormal; Notable for the following components:  ? APPearance CLOUDY (*)   ? Hgb urine dipstick SMALL (*)   ? Ketones, ur 5 (*)   ? Leukocytes,Ua LARGE (*)   ? WBC, UA >50 (*)   ? Bacteria, UA FEW (*)   ? All other components within normal limits  ?CULTURE, BLOOD (ROUTINE X 2)  ?CULTURE, BLOOD (ROUTINE X 2)  ?LACTIC ACID, PLASMA  ?LACTIC ACID, PLASMA  ?C-REACTIVE PROTEIN  ? ? ?EKG ?EKG Interpretation ? ?Date/Time:  Tuesday May 25 2021 19:59:36 EDT ?Ventricular Rate:  86 ?PR Interval:  135 ?QRS Duration: 130 ?QT Interval:  419 ?QTC Calculation: 502 ?R Axis:   -1 ?Text Interpretation: Sinus rhythm Nonspecific intraventricular conduction delay Artifact in lead(s) I II III aVR aVL aVF V1 V2 V5 V6 Poor baseline makes interpretation difficult Confirmed by Ernie Avena ([id number]) on 05/25/2021 8:41:00 PM ? ?Radiology ?US Renal ? ?Result Date: 05/25/2021 ?CLINICAL DATA:  Acute renal injury EXAM: RENAL / URINARY TRACT ULTRASOUND COMPLETE  COMPARISON:  None Available. FINDINGS: Right Kidney: Renal measurements: 11.0 x 4.7 x 4.1 cm. = volume: 110 mL. 1.9 cm upper pole simple cyst is noted. No further follow-up is recommended. Mild cortical thinning is noted. Left Kidney: Renal measurements: 11.3 x 4.5 x 4.1 cm. = volume: 108 mL. Cortical thinning is noted. Mild fullness of the renal pelvis is seen. Bladder: Well distended with dependent echogenic material which may be related to underlying UTI. Other: None. IMPRESSION: Changes suspicious for underlying urinary tract infection. Relate with laboratory values. Mild fullness of the left renal pelvis without definitive caliceal dilatation. Simple right renal cyst.  Electronically Signed   By: Alcide Clever M.D.   On: 05/25/2021 21:42  ? ?VAS [place] LOWER EXTREMITY VENOUS (DVT) (ONLY [facility] & [facility]) ? ?Result Date: 05/26/2021 ? Lower Venous DVT Study Patient Name:  EDDIE KOC [facility]  Date of Exam:   05/25/2021 Medical Rec #: [id number]        Accession #:    [id number] Date of Birth: 08/28/1960        Patient Gender: F Patient Age:   66 years Exam Location:  [facility] Procedure:      VAS [place] LOWER EXTREMITY VENOUS (DVT) Referring Phys: Ernie Avena --------------------------------------------------------------------------------  Indications: Severe pain, erythema, and swelling of bilateral lower legs.  Limitations: Poor ultrasound/tissue interface due to overlying edema and skin changes. Patient pain. Comparison Study: No recent prior studies. Performing Technologist: Jean Rosenthal RDMS, RVT  Examination Guidelines: A complete evaluation includes B-mode imaging, spectral Doppler, color Doppler, and power Doppler as needed of all accessible portions of each vessel. Bilateral testing is considered an integral part of a complete examination. Limited examinations for reoccurring indications may be performed as noted. The reflux portion of the exam is performed with the patient in reverse Trendelenburg.   +---------+---------------+---------+-----------+----------+---------------+ RIGHT    CompressibilityPhasicitySpontaneityPropertiesThrombus Aging  +---------+---------------+---------+-----------+----------+---------------+ CFV      F

## 2021-05-25 NOTE — ED Provider Notes (Signed)
Care of the patient assumed at the change of shift. History of septic shock from LE wound infection, just discharged a week ago after a month in the hospital is back with similar presentation.  ?Physical Exam  ?BP (!) 89/53   Pulse 87   Temp 97.8 ?F (36.6 ?C)   Resp 14   SpO2 99%  ? ?Physical Exam ? ?Procedures  ?Procedures ? ?ED Course / MDM  ? ?Clinical Course as of 05/26/21 0058  ?Tue May 25, 2021  ?1720 WBC(!): 13.9 [JL]  ?2046 Anion gap(!): 22 [JL]  ?2047 CO2(!): 13 [JL]  ?2047 Creatinine(!): 2.70 [JL]  ?2047 Potassium(!): 5.3 [JL]  ?2047 Sodium(!): 128 [JL]  ?2047 BUN(!): 125 [JL]  ?2344 Patient with continued soft blood pressures after 2000cc fluid total, finishing another bag now. Discussed with Dr. Julian Reil who would like to reassess her pressures after this next bag is done. If she remains hypotensive, may require pressors and ICU admission . [CS]  ?Wed May 26, 2021  ?0043 Patient's MAPs are remaining low. Will start levophed, spoke with Dr. Jayme Cloud, ICU, who will evaluate the patient for admission.  [CS]  ?  ?Clinical Course User Index ?[CS] Pollyann Savoy, MD ?[JL] Ernie Avena, MD  ? ?Medical Decision Making ?Problems Addressed: ?AKI (acute kidney injury) [facility]): acute illness or injury ?Cellulitis of lower extremity, unspecified laterality: acute illness or injury ?Hyperkalemia: acute illness or injury ?Septic shock (HCC): acute illness or injury that poses a threat to life or bodily functions ? ?Amount and/or Complexity of Data Reviewed ?Labs: ordered. Decision-making details documented in ED Course. ?Radiology: ordered. ? ?Risk ?Prescription drug management. ?Decision regarding hospitalization. ? ? ? ? ? ? ?  ?Pollyann Savoy, MD ?05/26/21 [phone] ? ?

## 2021-05-25 NOTE — Progress Notes (Addendum)
Lower extremity venous bilateral study completed. ? ?Preliminary results relayed to Armandina Gemma, MD. ? ?See CV Proc for preliminary results report.  ? ?Darlin Coco, RDMS, RVT ? ? ?

## 2021-05-25 NOTE — ED Notes (Signed)
US at bedside

## 2021-05-26 ENCOUNTER — Emergency Department (HOSPITAL_COMMUNITY): Payer: Medicare Other

## 2021-05-26 DIAGNOSIS — E875 Hyperkalemia: Secondary | ICD-10-CM | POA: Diagnosis present

## 2021-05-26 DIAGNOSIS — Z20822 Contact with and (suspected) exposure to covid-19: Secondary | ICD-10-CM | POA: Diagnosis present

## 2021-05-26 DIAGNOSIS — K219 Gastro-esophageal reflux disease without esophagitis: Secondary | ICD-10-CM | POA: Diagnosis present

## 2021-05-26 DIAGNOSIS — G40909 Epilepsy, unspecified, not intractable, without status epilepticus: Secondary | ICD-10-CM | POA: Diagnosis present

## 2021-05-26 DIAGNOSIS — L89153 Pressure ulcer of sacral region, stage 3: Secondary | ICD-10-CM | POA: Diagnosis present

## 2021-05-26 DIAGNOSIS — E871 Hypo-osmolality and hyponatremia: Secondary | ICD-10-CM | POA: Diagnosis present

## 2021-05-26 DIAGNOSIS — D649 Anemia, unspecified: Secondary | ICD-10-CM | POA: Diagnosis present

## 2021-05-26 DIAGNOSIS — E872 Acidosis, unspecified: Secondary | ICD-10-CM | POA: Diagnosis present

## 2021-05-26 DIAGNOSIS — N179 Acute kidney failure, unspecified: Secondary | ICD-10-CM

## 2021-05-26 DIAGNOSIS — R6521 Severe sepsis with septic shock: Secondary | ICD-10-CM | POA: Diagnosis present

## 2021-05-26 DIAGNOSIS — E861 Hypovolemia: Secondary | ICD-10-CM | POA: Diagnosis present

## 2021-05-26 DIAGNOSIS — A419 Sepsis, unspecified organism: Secondary | ICD-10-CM | POA: Diagnosis present

## 2021-05-26 DIAGNOSIS — L0390 Cellulitis, unspecified: Secondary | ICD-10-CM | POA: Diagnosis not present

## 2021-05-26 DIAGNOSIS — R319 Hematuria, unspecified: Secondary | ICD-10-CM | POA: Diagnosis present

## 2021-05-26 DIAGNOSIS — J449 Chronic obstructive pulmonary disease, unspecified: Secondary | ICD-10-CM | POA: Diagnosis present

## 2021-05-26 DIAGNOSIS — M797 Fibromyalgia: Secondary | ICD-10-CM | POA: Diagnosis present

## 2021-05-26 DIAGNOSIS — L03119 Cellulitis of unspecified part of limb: Secondary | ICD-10-CM | POA: Diagnosis not present

## 2021-05-26 DIAGNOSIS — N390 Urinary tract infection, site not specified: Secondary | ICD-10-CM | POA: Diagnosis present

## 2021-05-26 DIAGNOSIS — G35 Multiple sclerosis: Secondary | ICD-10-CM | POA: Diagnosis present

## 2021-05-26 DIAGNOSIS — I10 Essential (primary) hypertension: Secondary | ICD-10-CM | POA: Diagnosis present

## 2021-05-26 DIAGNOSIS — L03115 Cellulitis of right lower limb: Secondary | ICD-10-CM | POA: Diagnosis present

## 2021-05-26 DIAGNOSIS — R6520 Severe sepsis without septic shock: Secondary | ICD-10-CM | POA: Diagnosis not present

## 2021-05-26 DIAGNOSIS — E43 Unspecified severe protein-calorie malnutrition: Secondary | ICD-10-CM | POA: Diagnosis present

## 2021-05-26 DIAGNOSIS — N281 Cyst of kidney, acquired: Secondary | ICD-10-CM | POA: Diagnosis present

## 2021-05-26 DIAGNOSIS — M459 Ankylosing spondylitis of unspecified sites in spine: Secondary | ICD-10-CM | POA: Diagnosis present

## 2021-05-26 DIAGNOSIS — L89323 Pressure ulcer of left buttock, stage 3: Secondary | ICD-10-CM | POA: Diagnosis present

## 2021-05-26 DIAGNOSIS — L89159 Pressure ulcer of sacral region, unspecified stage: Secondary | ICD-10-CM | POA: Diagnosis not present

## 2021-05-26 DIAGNOSIS — M7989 Other specified soft tissue disorders: Secondary | ICD-10-CM | POA: Diagnosis not present

## 2021-05-26 DIAGNOSIS — L03116 Cellulitis of left lower limb: Secondary | ICD-10-CM | POA: Diagnosis present

## 2021-05-26 LAB — URINALYSIS, ROUTINE W REFLEX MICROSCOPIC
Bilirubin Urine: NEGATIVE
Glucose, UA: NEGATIVE mg/dL
Ketones, ur: 5 mg/dL — AB
Nitrite: NEGATIVE
Protein, ur: NEGATIVE mg/dL
Specific Gravity, Urine: 1.011 (ref 1.005–1.030)
WBC, UA: >50 WBC/hpf — ABNORMAL HIGH (ref 0–5)
pH: 5 (ref 5.0–8.0)

## 2021-05-26 LAB — RENAL FUNCTION PANEL
Albumin: 2.5 g/dL — ABNORMAL LOW (ref 3.5–5.0)
Anion gap: 14 (ref 5–15)
BUN: 79 mg/dL — ABNORMAL HIGH (ref 8–23)
CO2: 16 mmol/L — ABNORMAL LOW (ref 22–32)
Calcium: 7.9 mg/dL — ABNORMAL LOW (ref 8.9–10.3)
Chloride: 104 mmol/L (ref 98–111)
Creatinine, Ser: 1.14 mg/dL — ABNORMAL HIGH (ref 0.44–1.00)
GFR, Estimated: 54 mL/min — ABNORMAL LOW (ref >60)
Glucose, Bld: 142 mg/dL — ABNORMAL HIGH (ref 70–99)
Phosphorus: 3.8 mg/dL (ref 2.5–4.6)
Potassium: 3 mmol/L — ABNORMAL LOW (ref 3.5–5.1)
Sodium: 134 mmol/L — ABNORMAL LOW (ref 135–145)

## 2021-05-26 LAB — GLUCOSE, CAPILLARY
Glucose-Capillary: 204 mg/dL — ABNORMAL HIGH (ref 70–99)
Glucose-Capillary: 59 mg/dL — ABNORMAL LOW (ref 70–99)
Glucose-Capillary: 61 mg/dL — ABNORMAL LOW (ref 70–99)
Glucose-Capillary: 139 mg/dL — ABNORMAL HIGH (ref 70–99)
Glucose-Capillary: 133 mg/dL — ABNORMAL HIGH (ref 70–99)

## 2021-05-26 LAB — CBC
HCT: 23.4 % — ABNORMAL LOW (ref 36.0–46.0)
HCT: 29.5 % — ABNORMAL LOW (ref 36.0–46.0)
Hemoglobin: 7.6 g/dL — ABNORMAL LOW (ref 12.0–15.0)
Hemoglobin: 9.8 g/dL — ABNORMAL LOW (ref 12.0–15.0)
MCH: 28.5 pg (ref 26.0–34.0)
MCH: 28.2 pg (ref 26.0–34.0)
MCHC: 32.5 g/dL (ref 30.0–36.0)
MCHC: 33.2 g/dL (ref 30.0–36.0)
MCV: 87.6 fL (ref 80.0–100.0)
MCV: 85 fL (ref 80.0–100.0)
Platelets: 309 10*3/uL (ref 150–400)
Platelets: 386 10*3/uL (ref 150–400)
RBC: 2.67 MIL/uL — ABNORMAL LOW (ref 3.87–5.11)
RBC: 3.47 MIL/uL — ABNORMAL LOW (ref 3.87–5.11)
RDW: 16 % — ABNORMAL HIGH (ref 11.5–15.5)
RDW: 16.2 % — ABNORMAL HIGH (ref 11.5–15.5)
WBC: 7 10*3/uL (ref 4.0–10.5)
WBC: 8.9 10*3/uL (ref 4.0–10.5)
nRBC: 0 % (ref 0.0–0.2)
nRBC: 0 % (ref 0.0–0.2)

## 2021-05-26 LAB — BASIC METABOLIC PANEL
Anion gap: 14 (ref 5–15)
BUN: 79 mg/dL — ABNORMAL HIGH (ref 8–23)
CO2: 16 mmol/L — ABNORMAL LOW (ref 22–32)
Calcium: 7.9 mg/dL — ABNORMAL LOW (ref 8.9–10.3)
Chloride: 104 mmol/L (ref 98–111)
Creatinine, Ser: 1.11 mg/dL — ABNORMAL HIGH (ref 0.44–1.00)
GFR, Estimated: 56 mL/min — ABNORMAL LOW (ref >60)
Glucose, Bld: 142 mg/dL — ABNORMAL HIGH (ref 70–99)
Potassium: 3 mmol/L — ABNORMAL LOW (ref 3.5–5.1)
Sodium: 134 mmol/L — ABNORMAL LOW (ref 135–145)

## 2021-05-26 LAB — RESP PANEL BY RT-PCR (FLU A&B, COVID) ARPGX2
Influenza A by PCR: NEGATIVE
Influenza B by PCR: NEGATIVE
SARS Coronavirus 2 by RT PCR: NEGATIVE

## 2021-05-26 LAB — PROCALCITONIN: Procalcitonin: <0.1 ng/mL

## 2021-05-26 LAB — LACTIC ACID, PLASMA
Lactic Acid, Venous: 8.9 mmol/L (ref 0.5–1.9)
Lactic Acid, Venous: 1.9 mmol/L (ref 0.5–1.9)
Lactic Acid, Venous: 1.3 mmol/L (ref 0.5–1.9)

## 2021-05-26 LAB — MRSA NEXT GEN BY PCR, NASAL: MRSA by PCR Next Gen: NOT DETECTED

## 2021-05-26 LAB — PROTIME-INR
INR: 1.8 — ABNORMAL HIGH (ref 0.8–1.2)
Prothrombin Time: 20.3 seconds — ABNORMAL HIGH (ref 11.4–15.2)

## 2021-05-26 LAB — MAGNESIUM: Magnesium: 2 mg/dL (ref 1.7–2.4)

## 2021-05-26 LAB — CREATININE, SERUM
Creatinine, Ser: 1.02 mg/dL — ABNORMAL HIGH (ref 0.44–1.00)
GFR, Estimated: >60 mL/min (ref >60)

## 2021-05-26 LAB — C-REACTIVE PROTEIN: CRP: 14.1 mg/dL — ABNORMAL HIGH (ref <1.0)

## 2021-05-26 MED ORDER — PANTOPRAZOLE SODIUM 40 MG IV SOLR: 40.0000 mg | Freq: Every day | INTRAVENOUS | Status: DC

## 2021-05-26 MED ORDER — OXYCODONE HCL 5 MG PO TABS
5.0000 mg | ORAL_TABLET | Freq: Once | ORAL | Status: AC
  Administered 2021-05-26: 5 mg via ORAL
  Filled 2021-05-26: qty 1

## 2021-05-26 MED ORDER — CHLORHEXIDINE GLUCONATE CLOTH 2 % EX PADS
6.0000 | MEDICATED_PAD | Freq: Every day | CUTANEOUS | Status: DC
  Administered 2021-05-26: 6 via TOPICAL

## 2021-05-26 MED ORDER — DEXTROSE 50 % IV SOLN: 12.5000 g | INTRAVENOUS | Status: AC

## 2021-05-26 MED ORDER — DEXTROSE 50 % IV SOLN
INTRAVENOUS | Status: AC
  Administered 2021-05-26: 12.5 g via INTRAVENOUS
  Filled 2021-05-26: qty 50

## 2021-05-26 MED ORDER — DOCUSATE SODIUM 100 MG PO CAPS: 100.0000 mg | ORAL_CAPSULE | Freq: Two times a day (BID) | ORAL | Status: DC | PRN

## 2021-05-26 MED ORDER — ONDANSETRON HCL 4 MG/2ML IJ SOLN: 4.0000 mg | Freq: Four times a day (QID) | INTRAMUSCULAR | Status: DC | PRN

## 2021-05-26 MED ORDER — SODIUM CHLORIDE 0.9 % IV SOLN: INTRAVENOUS | Status: DC | PRN

## 2021-05-26 MED ORDER — SODIUM CHLORIDE 0.9 % IV SOLN
2.0000 g | Freq: Once | INTRAVENOUS | Status: AC
  Administered 2021-05-26: 2 g via INTRAVENOUS
  Filled 2021-05-26: qty 12.5

## 2021-05-26 MED ORDER — VANCOMYCIN VARIABLE DOSE PER UNSTABLE RENAL FUNCTION (PHARMACIST DOSING): Status: DC

## 2021-05-26 MED ORDER — SODIUM CHLORIDE 0.9 % IV SOLN
2.0000 g | Freq: Two times a day (BID) | INTRAVENOUS | Status: DC
  Administered 2021-05-26 – 2021-05-27 (×2): 2 g via INTRAVENOUS
  Filled 2021-05-26 (×2): qty 12.5

## 2021-05-26 MED ORDER — NYSTATIN 100000 UNIT/GM EX CREA
TOPICAL_CREAM | Freq: Two times a day (BID) | CUTANEOUS | Status: DC
  Administered 2021-05-27 – 2021-06-02 (×3): 1 via TOPICAL
  Filled 2021-05-26: qty 30

## 2021-05-26 MED ORDER — POTASSIUM CHLORIDE 10 MEQ/100ML IV SOLN
10.0000 meq | INTRAVENOUS | Status: AC
  Administered 2021-05-26 (×2): 10 meq via INTRAVENOUS
  Filled 2021-05-26 (×2): qty 100

## 2021-05-26 MED ORDER — DEXTROSE IN LACTATED RINGERS 5 % IV SOLN: INTRAVENOUS | Status: DC

## 2021-05-26 MED ORDER — POLYETHYLENE GLYCOL 3350 17 G PO PACK: 17.0000 g | PACK | Freq: Every day | ORAL | Status: DC | PRN

## 2021-05-26 MED ORDER — GABAPENTIN 300 MG PO CAPS
300.0000 mg | ORAL_CAPSULE | Freq: Two times a day (BID) | ORAL | Status: DC
Start: 2021-05-26 — End: 2021-06-04
  Administered 2021-05-26 – 2021-06-03 (×16): 300 mg via ORAL
  Filled 2021-05-26 (×16): qty 1

## 2021-05-26 MED ORDER — HYDROXYZINE HCL 25 MG PO TABS
25.0000 mg | ORAL_TABLET | Freq: Two times a day (BID) | ORAL | Status: DC | PRN
  Administered 2021-05-30: 25 mg via ORAL
  Filled 2021-05-26: qty 1

## 2021-05-26 MED ORDER — PANTOPRAZOLE SODIUM 40 MG PO TBEC
40.0000 mg | DELAYED_RELEASE_TABLET | Freq: Every day | ORAL | Status: DC
  Administered 2021-05-26 – 2021-06-02 (×8): 40 mg via ORAL
  Filled 2021-05-26 (×8): qty 1

## 2021-05-26 MED ORDER — HEPARIN SODIUM (PORCINE) 5000 UNIT/ML IJ SOLN
5000.0000 [IU] | Freq: Three times a day (TID) | INTRAMUSCULAR | Status: DC
  Administered 2021-05-26 – 2021-06-03 (×25): 5000 [IU] via SUBCUTANEOUS
  Filled 2021-05-26 (×25): qty 1

## 2021-05-26 MED ORDER — NOREPINEPHRINE 4 MG/250ML-% IV SOLN
0.0000 ug/min | INTRAVENOUS | Status: DC
  Administered 2021-05-26: 2 ug/min via INTRAVENOUS
  Administered 2021-05-27 – 2021-05-28 (×2): 4 ug/min via INTRAVENOUS
  Filled 2021-05-26 (×4): qty 250

## 2021-05-26 MED ORDER — ORAL CARE MOUTH RINSE
15.0000 mL | Freq: Two times a day (BID) | OROMUCOSAL | Status: DC
  Administered 2021-05-26 – 2021-06-03 (×15): 15 mL via OROMUCOSAL

## 2021-05-26 MED ORDER — CHLORHEXIDINE GLUCONATE CLOTH 2 % EX PADS
6.0000 | MEDICATED_PAD | Freq: Every day | CUTANEOUS | Status: DC
  Administered 2021-05-27 – 2021-06-02 (×7): 6 via TOPICAL

## 2021-05-26 MED ORDER — LEVETIRACETAM 500 MG PO TABS
500.0000 mg | ORAL_TABLET | Freq: Two times a day (BID) | ORAL | Status: DC
  Administered 2021-05-26 – 2021-06-03 (×17): 500 mg via ORAL
  Filled 2021-05-26 (×18): qty 1

## 2021-05-26 NOTE — ED Notes (Signed)
Consulting IV team ?

## 2021-05-26 NOTE — Progress Notes (Signed)
eLink Physician-Brief Progress Note ?Patient Name: [name] ?DOB: 06/16/1960 ?MRN: [id number] ? ? ?Date of Service ? 05/26/2021  ?HPI/Events of Note ? 61 year old woman admitted to ICU with septic shock and AKI. Has arrived to ICU and feels much better. Is on 2 mic/min levophed via peripheral line. RN reports hypoglycemia and poor appetite in general. Has LR infusing at 125 cc/hour.  PCCM consulted and admitting, their note is pending  ?eICU Interventions ? Changed fluids to D5 LR since hypoglycemic  ?PCCM placed other orders ?AM ;abs pending ?Call E link if needed  ? ? ? ?Intervention Category ?Major Interventions: Shock - evaluation and management ?Evaluation Type: New Patient Evaluation ? ?Brock Mokry G Stori Royse ?05/26/2021, 5:03 AM ?

## 2021-05-26 NOTE — Progress Notes (Signed)
eLink Physician-Brief Progress Note ?Patient Name: DENETRIA LUEVANOS ?DOB: May 12, 1960 ?MRN: [id number] ? ? ?Date of Service ? 05/26/2021  ?HPI/Events of Note ? Pain - Patient c/o bilateral LE pain. History of fibromyalgia.   ?eICU Interventions ? Plan: ?Oxycodone IR 5 mg PO X 1 now.  ?Restart home Neurontin 300 mg PO now and BID.   ? ? ? ?Intervention Category ?Major Interventions: Other: ? ?Delorese Sellin Dennard Nip ?05/26/2021, 9:09 PM ?

## 2021-05-26 NOTE — Evaluation (Signed)
Clinical/Bedside Swallow Evaluation ?Patient Details  ?Name: [name] ?MRN: [id number] ?Date of Birth: 1960-10-24 ? ?Today's Date: 05/26/2021 ?Time: SLP Start Time (ACUTE ONLY): 5809 SLP Stop Time (ACUTE ONLY): 0940 ?SLP Time Calculation (min) (ACUTE ONLY): 20 min ? ?Past Medical History:  ?Past Medical History:  ?Diagnosis Date  ? Allergy   ? seasonal  ? Anemia   ? GERD (gastroesophageal reflux disease)   ? HLA B27 (HLA B27 positive)   ? Hx: UTI (urinary tract infection)   ? Hyperplastic colon polyp   ? Hypertension   ? IBS (irritable bowel syndrome)   ? Multiple sclerosis ([place])   ? Seizures ([place])   ? hx epilepsy-last seizure 9/14 after taking tramadol  ? ?Past Surgical History:  ?Past Surgical History:  ?Procedure Laterality Date  ? APPENDECTOMY    ? BLADDER SURGERY  2007  ? CESAREAN SECTION    ? x 2   ? ORIF TOE FRACTURE Left 06/27/2013  ? Procedure: OPEN REDUCTION INTERNAL FIXATION (ORIF) LEFT FIFTH METATARSAL;  Surgeon: Wylene Simmer, MD;  Location: [place];  Service: Orthopedics;  Laterality: Left;  ? PARTIAL HYSTERECTOMY  2006  ? TONSILLECTOMY    ? TUBAL LIGATION    ? ?HPI:  ?Patient is a 61 y.o. female, recently admitted 4/17-5/9 for septic shock due to cellulitis of RLE, PMH: GERD, ultiple sclerosis (ambulatory with walker), anklyosing spondylitis, GERD, seizure disorder, fibromyalgia. She presented from home to ED after  [facility] wound nurse noted worsening LE wounds. In ED has had persistent hypotension with no improvement with fluids. PCCM consulted given hypotension despite fluid resuscitation and concern for septic shock. She was started on a clear liquids diet. CXR showed No evidence of acute cardiopulmonary disease.  ?  ?Assessment / Plan / Recommendation  ?Clinical Impression ? Patient presents with mild oropharyngeal dysphagia as per this bedside/clinical swallow evaluation with suspected mild pharyngeal and esophageal dysphagia as well based on patient's history (see MBS 05/13/21).  Voice was clear and strong, cough was weak and congested. She exhibited a mildly delayed cough after sips of thin liquids (water), however suspect this was more related to attempts to expectorate secretions rather than true penetration/aspiration. With regular texture PO's (graham cracker), patient exhibited mildly delayed mastication and oral transit with suspected impact from somewhat dry oral mucosa. SLP suspects patient's current swallow function is at or near baseline since recent hospitalization and recommending return to Dys 3 solids, thin liquids. SLP will f/u at least one more time to ensure PO toleration. ?SLP Visit Diagnosis: Dysphagia, unspecified (R13.10) ?   ?Aspiration Risk ? Mild aspiration risk  ?  ?Diet Recommendation Dysphagia 3 (Mech soft);Thin liquid  ? ?Liquid Administration via: Cup;Straw ?Medication Administration: Whole meds with puree ?Supervision: Patient able to self feed;Full supervision/cueing for compensatory strategies ?Compensations: Slow rate;Small sips/bites;Clear throat intermittently;Follow solids with liquid ?Postural Changes: Seated upright at 90 degrees;Remain upright for at least 30 minutes after po intake  ?  ?Other  Recommendations Oral Care Recommendations: Oral care BID   ? ?Recommendations for follow up therapy are one component of a multi-disciplinary discharge planning process, led by the attending physician.  Recommendations may be updated based on patient status, additional functional criteria and insurance authorization. ? ?Follow up Recommendations No SLP follow up  ? ? ?  ?Assistance Recommended at Discharge Intermittent Supervision/Assistance  ?Functional Status Assessment Patient has had a recent decline in their functional status and demonstrates the ability to make significant improvements in function in a reasonable  and predictable amount of time.  ?Frequency and Duration min 1 x/week  ?1 week ?  ?   ? ?Prognosis Prognosis for Safe Diet Advancement: Good  ? ?   ? ?Swallow Study   ?General Date of Onset: 05/26/21 ?HPI: Patient is a 61 y.o. female, recently admitted 4/17-5/9 for septic shock due to cellulitis of RLE, PMH: GERD, ultiple sclerosis (ambulatory with walker), anklyosing spondylitis, GERD, seizure disorder, fibromyalgia. She presented from home to ED after  [facility] wound nurse noted worsening LE wounds. In ED has had persistent hypotension with no improvement with fluids. PCCM consulted given hypotension despite fluid resuscitation and concern for septic shock. She was started on a clear liquids diet. CXR showed No evidence of acute cardiopulmonary disease. ?Type of Study: Bedside Swallow Evaluation ?Previous Swallow Assessment: MBS on 05/13/21 during recent previous admission ?Diet Prior to this Study: Thin liquids ?Temperature Spikes Noted: No ?Respiratory Status: Room air ?History of Recent Intubation: No ?Behavior/Cognition: Alert;Cooperative;Pleasant mood ?Oral Cavity Assessment: Dry ?Oral Care Completed by SLP: Recent completion by staff ?Oral Cavity - Dentition: Adequate natural dentition ?Vision: Functional for self-feeding ?Self-Feeding Abilities: Able to feed self ?Patient Positioning: Upright in bed ?Baseline Vocal Quality: Normal ?Volitional Cough: Congested;Weak ?Volitional Swallow: Able to elicit  ?  ?Oral/Motor/Sensory Function Overall Oral Motor/Sensory Function: Within functional limits   ?Ice Chips     ?Thin Liquid Thin Liquid: Within functional limits ?Presentation: Straw;Self Fed ?Pharyngeal  Phase Impairments:  (l)  ?  ?Nectar Thick     ?Honey Thick     ?Puree Puree: Not tested   ?Solid ? ? ?  Solid: Impaired ?Oral Phase Functional Implications: Other (comment) (mildly delayed mastication and oral transit) ?Pharyngeal Phase Impairments: Other (comments) (no overt s/s pharyngeal phase dysphagia observed)  ? ?  ? ?Sonia Baller, MA, CCC-SLP ?Speech Therapy ? ? ? ? ?

## 2021-05-26 NOTE — Progress Notes (Addendum)
[facility] ?NEPHROLOGY PROGRESS NOTE ? ?Assessment/ Plan: ?Pt is a 61 y.o. yo female with history of MS, ankylosing spondylitis, GERD, seizure disorder, fibromyalgia admitted with lower extremity cellulitis, AKI. ? ?#Acute kidney injury likely in the setting of hypotension, UTI/sepsis concomitant with use of NSAIDs.  She is currently on antibiotics for UTI.  Kidney ultrasound ruled out hydronephrosis.  The creatinine was 2.7 on admission and this morning the creatinine level already improved to 1.02.  She is nonoliguric.  I will repeat renal panel today to confirm the result.  Currently on pressures to maintain hemodynamics. ?Recommend strict ins and out and lab monitoring. ? ?#Hyperkalemia: Status post medical treatment.  Repeating lab today. ? ?#Hyponatremia presumably due to reduced free water excretion and AKI.  Received IV fluid.  Repeating lab. ? ?#Septic shock due to leg cellulitis: UA with UTI.  Currently on antibiotics per primary team.  On Levophed for hypotension. ? ?Renal function improved to her baseline. Sign off, please call us back with  question. ? ?Subjective: Seen and examined in ICU.  Her blood pressure is better on Levophed.  Urine output around 600 cc this morning.  The patient feels tired but denies nausea vomiting chest pain or shortness of breath.  Discussed with ICU nurse. ?Objective ?Vital signs in last 24 hours: ?Vitals:  ? 05/26/21 0600 05/26/21 0615 05/26/21 0630 05/26/21 0747  ?BP: (!) 87/46 (!) 95/48    ?Pulse: 95 92 94   ?Resp: 15 13 15    ?Temp:    (!) 97.4 ?F (36.3 ?C)  ?TempSrc:    Oral  ?SpO2: 96% 98% 97%   ?Weight:      ? ?Weight change:  ? ?Intake/Output Summary (Last 24 hours) at 05/26/2021 0825 ?Last data filed at 05/26/2021 0700 ?Gross per 24 hour  ?Intake 3525.23 ml  ?Output 600 ml  ?Net 2925.23 ml  ? ? ? ? ? ?Labs: ?Basic Metabolic Panel: ?Recent Labs  ?Lab 05/25/21 ?1803 05/26/21 ?0330  ?NA 128*  --   ?K 5.3*  --   ?CL 93*  --   ?CO2 13*  --   ?GLUCOSE 76  --    ?BUN 125*  --   ?CREATININE 2.70* 1.02*  ?CALCIUM 7.4*  --   ? ?Liver Function Tests: ?Recent Labs  ?Lab 05/25/21 ?1803  ?AST 12*  ?ALT 13  ?ALKPHOS 80  ?BILITOT 0.8  ?PROT 5.1*  ?ALBUMIN 2.6*  ? ?No results for input(s): LIPASE, AMYLASE in the last 168 hours. ?No results for input(s): AMMONIA in the last 168 hours. ?CBC: ?Recent Labs  ?Lab 05/25/21 ?1608 05/26/21 ?0330  ?WBC 13.9* 7.0  ?NEUTROABS 12.2*  --   ?HGB 12.0 7.6*  ?HCT 36.4 23.4*  ?MCV 87.7 87.6  ?PLT 461* 309  ? ?Cardiac Enzymes: ?No results for input(s): CKTOTAL, CKMB, CKMBINDEX, TROPONINI in the last 168 hours. ?CBG: ?Recent Labs  ?Lab 05/26/21 ?0433 05/26/21 ?0454 05/26/21 ?05/28/21 05/26/21 ?0741  ?GLUCAP 61* 59* 204* 139*  ? ? ?Iron Studies: No results for input(s): IRON, TIBC, TRANSFERRIN, FERRITIN in the last 72 hours. ?Studies/Results: ?05/28/21 Renal ? ?Result Date: 05/25/2021 ?CLINICAL DATA:  Acute renal injury EXAM: RENAL / URINARY TRACT ULTRASOUND COMPLETE COMPARISON:  None Available. FINDINGS: Right Kidney: Renal measurements: 11.0 x 4.7 x 4.1 cm. = volume: 110 mL. 1.9 cm upper pole simple cyst is noted. No further follow-up is recommended. Mild cortical thinning is noted. Left Kidney: Renal measurements: 11.3 x 4.5 x 4.1 cm. = volume: 108 mL. Cortical thinning is  noted. Mild fullness of the renal pelvis is seen. Bladder: Well distended with dependent echogenic material which may be related to underlying UTI. Other: None. IMPRESSION: Changes suspicious for underlying urinary tract infection. Relate with laboratory values. Mild fullness of the left renal pelvis without definitive caliceal dilatation. Simple right renal cyst. Electronically Signed   By: Alcide Clever M.D.   On: 05/25/2021 21:42  ? ?DG Chest Portable 1 View ? ?Result Date: 05/26/2021 ?CLINICAL DATA:  Hypotension EXAM: PORTABLE CHEST 1 VIEW COMPARISON:  05/12/2021 FINDINGS: Lungs are clear.  No pleural effusion or pneumothorax. The heart is normal in size. IMPRESSION: No evidence of acute  cardiopulmonary disease. Electronically Signed   By: Charline Bills M.D.   On: 05/26/2021 01:20  ? ?VAS [place] LOWER EXTREMITY VENOUS (DVT) (ONLY [facility] & [facility]) ? ?Result Date: 05/26/2021 ? Lower Venous DVT Study Patient Name:  [name] [facility]  Date of Exam:   05/25/2021 Medical Rec #: [id number]        Accession #:    [id number] Date of Birth: Aug 30, 1960        Patient Gender: F Patient Age:   4 years Exam Location:  [facility] Procedure:      VAS [place] LOWER EXTREMITY VENOUS (DVT) Referring Phys: Ernie Avena --------------------------------------------------------------------------------  Indications: Severe pain, erythema, and swelling of bilateral lower legs.  Limitations: Poor ultrasound/tissue interface due to overlying edema and skin changes. Patient pain. Comparison Study: No recent prior studies. Performing Technologist: Jean Rosenthal RDMS, RVT  Examination Guidelines: A complete evaluation includes B-mode imaging, spectral Doppler, color Doppler, and power Doppler as needed of all accessible portions of each vessel. Bilateral testing is considered an integral part of a complete examination. Limited examinations for reoccurring indications may be performed as noted. The reflux portion of the exam is performed with the patient in reverse Trendelenburg.  +---------+---------------+---------+-----------+----------+---------------+ RIGHT    CompressibilityPhasicitySpontaneityPropertiesThrombus Aging  +---------+---------------+---------+-----------+----------+---------------+ CFV      Full           Yes      Yes                                  +---------+---------------+---------+-----------+----------+---------------+ SFJ      Full                                                         +---------+---------------+---------+-----------+----------+---------------+ FV Prox  Full                                                          +---------+---------------+---------+-----------+----------+---------------+ FV Mid   Full                                                         +---------+---------------+---------+-----------+----------+---------------+ FV DistalFull                                                         +---------+---------------+---------+-----------+----------+---------------+  PFV      Full                                                         +---------+---------------+---------+-----------+----------+---------------+ POP      Full           Yes      Yes                                  +---------+---------------+---------+-----------+----------+---------------+ PTV      Full                                                         +---------+---------------+---------+-----------+----------+---------------+ PERO                    Yes      Yes                  Patent by color +---------+---------------+---------+-----------+----------+---------------+ Gastroc  Full                                                         +---------+---------------+---------+-----------+----------+---------------+   +---------+---------------+---------+-----------+----------+---------------+ LEFT     CompressibilityPhasicitySpontaneityPropertiesThrombus Aging  +---------+---------------+---------+-----------+----------+---------------+ CFV      Full           Yes      Yes                                  +---------+---------------+---------+-----------+----------+---------------+ SFJ      Full                                                         +---------+---------------+---------+-----------+----------+---------------+ FV Prox  Full                                                         +---------+---------------+---------+-----------+----------+---------------+ FV Mid   Full                                                          +---------+---------------+---------+-----------+----------+---------------+ FV DistalFull                                                         +---------+---------------+---------+-----------+----------+---------------+  PFV      Full                                                         +---------+---------------+---------+-----------+----------+---------------+ POP      Full

## 2021-05-26 NOTE — Progress Notes (Signed)
? ?NAME:  [name], MRN:  [id number], DOB:  07-Mar-1960, LOS: 0 ?ADMISSION DATE:  05/25/2021, CONSULTATION DATE:  05/25/21 ?REFERRING MD:  Karle Starch, ED, CHIEF COMPLAINT:  LE wounds   ? ?History of Present Illness:  ?61 yo woman with multiple sclerosis (ambulatory with walker), anklyosing spondylitis, GERD, seizure disorder, fibromyalgia, recent admission 4/17-5/9 for septic shock due to cellulitis of the RLE c/b aspiration pneumonia and bilateral pneumothoraces s/p chest tube placements presents here after being sent in for worsening LE wounds by her wound nurse.  She started being treated w/ Ocrelizumab back in Feb. Per pt and family about 4 weeks after began to notice increased RLE swelling, scaling and weakness. She was then hospitalized for septic shock with multiple complications. She was discharged home from the hospital with home health.  Since then she's had worsening lower extremity swelling, pain, and redness. Today, she arrives to ED after wound care nurse evaluated her wounds.  ? ?Today initial BP ok , but has had persistent hypotension since then, with maps in low 60s, no improvement with fluids.  Baseline maps in 70s at least.  + 2.5 L in ED  In ED received lokelma, cefepime, vanc, started on levophed at around 2am. PCCM consulted for admission given hypotension despite fluid resuscitation and concern for septic shock.  ? ?Pertinent  Medical History  ?Multiple sclernosis ?Ankylosing spondylitis ?GERD ?Seizure disorder ?Stage 2 sacral ulcer/chronic venous statis, Superficial LE ulcers.  ?COPD  ? ?Significant Hospital Events: ?Including procedures, antibiotic start and stop dates in addition to other pertinent events   ?5/17 admitted, transferred to ICU for pressor support ? ?Interim History / Subjective:  ?Overnight was admitted to [facility] for hypotension despite IV fluids.  ? ?Objective   ?Blood pressure (!) 104/54, pulse 85, temperature (!) 97.4 ?F (36.3 ?C), temperature source Oral, resp. rate 15,  weight 58.2 kg, SpO2 98 %. ?   ?   ? ?Intake/Output Summary (Last 24 hours) at 05/26/2021 0939 ?Last data filed at 05/26/2021 0700 ?Gross per 24 hour  ?Intake 3525.23 ml  ?Output 600 ml  ?Net 2925.23 ml  ? ?Filed Weights  ? 05/26/21 0432  ?Weight: 58.2 kg  ? ?Examination: ?General: Resting in bed comfortably in no acute distress ?HENT: Normocephalic, atraumatic. Dry mucous membranes. ?Lungs: Normal respiratory effort on room air. Clear to ausculation bilaterally. ?Cardiovascular: Regular rate, rhythm. No murmurs. Distal pulses 2+ bilaterally. ?Abdomen: Soft, non-tender, non-distended. Normoactive bowel sounds. ?Extremities: Bilateral feet very erythematous, warm. Lower legs wrapped with gauze. No satellite lesions or crepitus appreciated. ?Neuro: Awake, alert, conversing appropriately. Grossly non-focal. ? ?Assessment & Plan:  ?#Hypotension, possibly septic shock ?BP has remained low since arrival despite continuous IV fluids. Is requiring low-dose pressors peripherally right now. Work-up on arrival w/ significant AKI, hyponatremia,  and no lactic acidosis. On exam it does appear she has cellulitis in bilateral lower extremities. WBC was mildly elevated on arrival. Differential for hypotension is infection leading to septic shock vs severe dehydration with acute renal failure. Possible infection w/ urinary tract infection and cellulitis. At this time will continue with antibiotics, fluids, and peripheral pressors as needed. If pressor needs increase will need to consider CVC placement. ?- Peripheral levophed w/ goal MAP >65 ?- Consider central access if pressor needs increase or persist ?- Currently on D5-LR, can hold fluids if starts eating ?- Antibiotics as below ? ?#Bilateral lower extremity cellulitis ?#Urinary tract infection ?Patient has a significant history of vascular insufficiency leading to significant venous stasis changes and  subsequent wounds. On exam, the area is very erythematous, warm, tender. In  addition, UA on arrival with pyuria and leukocytes. Patient empirically started on cefepime and vancomycin given recent long hospitalization and immunocompromised state. She is not neutropenic. With MRSA negative, will d/c vancomycin.  ?- Continue cefepime, follow cultures ?- WOC, appreciate assistance ?- Likely will need outpatient wound care ? ?#Acute non-oliguric renal failure ?#Anion gap metabolic acidosis ?At baseline has normal renal function. On arrival patient w/ sCr 2.7 and GFR 19, improved this morning w/ sCr 1.11 and GFR 56. BUN significantly elevated out of proportion to creatinine elevation, most likely pre-renal etiology. Other potential contributing factors of patient's renal failure include urinary tract infection and regular use of NSAID's. AGMA most likely related to acute renal failure, possibly some ketosis as well. Nephrology initially consulted in ED, appreciate their recommendations. No acute indication for HD. Will continue with antibiotics and fluids. Might need to add bicarb if does not improve over next 1-2 days. ?- Continue IV fluids ?- Avoid nephrotoxic meds ?- Strict I/O ?- Daily RFP ? ?#Hypovolemic hyponatremia ?On arrival Na 128, which has now improved to 134 this morning with fluids. Expect to continue to normalize with fluids. ?- Daily RFP ? ?#Normocytic anemia ?Hgb this morning 9.8, down from admission. Likely a dilutional effect along with acute illness. No signs or symptoms of active bleed. Does have elevated RDW, would consider checking iron once acute illness has resolved. ? ?#Hypokalemia ?Initial labs w/ K 5.3 on arrival. She was given lokelma and K dropped to 3.0 this AM. R/p 3.0 again. Will check Mg and replete.  ? ?#Hyperglycemia ?On arrival patient was hypoglycemic likely due to poor oral intake. She was initiated on D5-LR. This morning patient is up awake and talking and likely can eat. Is now slightly hyperglycemic. A1c 5.0% last month. If patient is eating will d/c  dextrose in fluids. ? ?#Hypoalbuminemia ?Low albumin likely in setting of recent, long hospital course. Will have our dieticians see Ms. Swartzlander and make recommendations.  ? ?#Seizure disorder ?- Continue home Keppra ? ?#Multiple sclerosis ?Started on Ocrelizumab in February, will need to follow-up with outpatient neurology. ? ?Best Practice (right click and "Reselect all SmartList Selections" daily)  ? ?Diet/type: [id number] ?DVT prophylaxis: prophylactic heparin  ?GI prophylaxis: PPI ?Lines: N/A ?Foley:  N/A ?Code Status:  full code ?Last date of multidisciplinary goals of care discussion []  ? ?Labs   ?CBC: ?Recent Labs  ?Lab 05/25/21 ?1608  ?WBC 13.9*  ?NEUTROABS 12.2*  ?HGB 12.0  ?HCT 36.4  ?MCV 87.7  ?PLT 461*  ? ? ?Basic Metabolic Panel: ?Recent Labs  ?Lab 05/25/21 ?1803  ?NA 128*  ?K 5.3*  ?CL 93*  ?CO2 13*  ?GLUCOSE 76  ?BUN 125*  ?CREATININE 2.70*  ?CALCIUM 7.4*  ? ?GFR: ?Estimated Creatinine Clearance: 18.4 mL/min (A) (by C-G formula based on SCr of 2.7 mg/dL (H)). ?Recent Labs  ?Lab 05/25/21 ?1608 05/25/21 ?1620  ?WBC 13.9*  --   ?LATICACIDVEN 1.2 1.1  ? ? ?Liver Function Tests: ?Recent Labs  ?Lab 05/25/21 ?1803  ?AST 12*  ?ALT 13  ?ALKPHOS 80  ?BILITOT 0.8  ?PROT 5.1*  ?ALBUMIN 2.6*  ? ?No results for input(s): LIPASE, AMYLASE in the last 168 hours. ?No results for input(s): AMMONIA in the last 168 hours. ? ?ABG ?   ?Component Value Date/Time  ? PHART 7.419 04/28/2021 0257  ? PCO2ART 35.2 04/28/2021 0257  ? PO2ART 73 (L) 04/28/2021 0257  ? HCO3 22.8 04/28/2021  0257  ? TCO2 24 04/28/2021 0257  ? ACIDBASEDEF 1.0 04/28/2021 0257  ? O2SAT 95 04/28/2021 0257  ?  ? ?Coagulation Profile: ?No results for input(s): INR, PROTIME in the last 168 hours. ? ?Cardiac Enzymes: ?No results for input(s): CKTOTAL, CKMB, CKMBINDEX, TROPONINI in the last 168 hours. ? ?HbA1C: ?Hgb A1c MFr Bld  ?Date/Time Value Ref Range Status  ?04/26/2021 01:07 PM 5.0 4.8 - 5.6 % Final  ?  Comment:  ?  (NOTE) ?Pre diabetes:           5.7%-6.4% ? ?Diabetes:              >6.4% ? ?Glycemic control for   <7.0% ?adults with diabetes ?  ? ? ?CBG: ?No results for input(s): GLUCAP in the last 168 hours. ? ?Critical care time: 45 min ?  ? ?Sanjuan Dame, MD ?Int

## 2021-05-26 NOTE — Progress Notes (Signed)
An USGPIV (ultrasound guided PIV) has been placed for short-term vasopressor infusion. A correctly placed ivWatch must be used when administering Vasopressors. Should this treatment be needed beyond 72 hours, central line access should be obtained.  It will be the responsibility of the bedside nurse to follow best practice to prevent extravasations.   ?

## 2021-05-26 NOTE — Progress Notes (Signed)
Pharmacy Antibiotic Note ? ?[name] is a 61 y.o. female admitted on 05/25/2021 with sepsis d/t wound infection.  Pharmacy has been consulted for vancomycin and cefepime dosing.  Pt w/ AKI, baseline SCr <1, currently 2.7. ? ?Plan: ?Vancomycin 1250mg  IV x1; monitor SCr +/- vanc level prior to redosing. ?Cefepime 2g IV Q12H.  ? ?Temp (24hrs), Avg:97.8 ?F (36.6 ?C), Min:97.7 ?F (36.5 ?C), Max:97.8 ?F (36.6 ?C) ? ?Recent Labs  ?Lab 05/25/21 ?1608 05/25/21 ?1620 05/25/21 ?1803 05/26/21 ?0330  ?WBC 13.9*  --   --  7.0  ?CREATININE  --   --  2.70* 1.02*  ?LATICACIDVEN 1.2 1.1  --  8.9*  ?  ?Estimated Creatinine Clearance: 48.7 mL/min (A) (by C-G formula based on SCr of 1.02 mg/dL (H)).   ? ?Allergies  ?Allergen Reactions  ? Lamictal [Lamotrigine] Rash  ? Other   ?  Cats: Rash  ?Shrimp scampi sauce: rash  ? Tramadol   ?  Lowers seizure threshold  ? ? ?Thank you for allowing pharmacy to be a part of this patient?s care. ? ?Wynona Neat, PharmD, BCPS  ?05/26/2021 4:31 AM ? ?

## 2021-05-26 NOTE — Consult Note (Signed)
WOC Nurse Consult Note: ?Reason for Consult:Sepsis.  Cellulitis to bilateral lower legs with nonintact lesions.  She indicates they have been present since February.  She has a stage 2 sacral pressure injury that is present on admission.  ?Wound type:pressure and infectious  ?Pressure Injury POA: Yes ?Measurement: right anterior lower leg:  4 cm x 3.4 cm x 0.2 cm  ?Right posterior lower leg:  2 cm x 4 cm x 0.1 cm  ?Left anterior lower leg: 2.5 cm x 2 cm x 0.1 cm  ?Sacrum:  1 cm x 2 cm x 0.1 cm  ?Wound bed: Legs-Ruddy red and warm to touch ?Sacrum: pink and moist ?Drainage (amount, consistency, odor) moderate serosanguinous  no odor.  ?Periwound: Erythematous, warm and edematous.  Tender to touch ?Dressing procedure/placement/frequency:Cleanse legs with NS and pat dry.  Apply Xeroform gauze to open wounds to both legs.  Cover with ABD pads and kerlix/tape. Change daily.  ?Silicone sacral foam to sacrum.  Change every three days and PRN soilage.  ?Will not follow at this time.  Please re-consult if needed.  ?Domenic Moras MSN, RN, FNP-BC CWON ?Wound, Ostomy, Continence Nurse ?Pager [phone]  ? ? ?  ?

## 2021-05-26 NOTE — ED Notes (Signed)
Paged IV team to bedside for IV assistance  ?

## 2021-05-26 NOTE — Progress Notes (Signed)
Initial Nutrition Assessment ? ?DOCUMENTATION CODES:  ? ?Severe malnutrition in context of acute illness/injury ? ?INTERVENTION:  ? ?Magic cup TID with meals, each supplement provides 290 kcal and 9 grams of protein. ? ?Pudding TID with meals. ? ?MVI with minerals daily. ? ?NUTRITION DIAGNOSIS:  ? ?Severe Malnutrition related to acute illness (sepsis, cellulitis causing poor appetite/intake) as evidenced by energy intake < or equal to 50% for > or equal to 5 days, severe fat depletion. ? ?GOAL:  ? ?Patient will meet greater than or equal to 90% of their needs ? ?MONITOR:  ? ?PO intake, Supplement acceptance, Labs, Skin ? ?REASON FOR ASSESSMENT:  ? ?Rounds ?  ? ?ASSESSMENT:  ? ?61 yo female admitted with BLE cellulitis, hypotension, UTI. PMH includes multiple sclerosis, ankylosing spondylitis, GERD, seizure disorder, fibromyalgia, recent admission for cellulitis BLE and septic shock. ? ?Discussed patient in ICU rounds and with RN today. ?Patient started on dysphagia 3 diet with thin liquids S/P SLP evaluation today. ? ?Spoke with patient and her husband at bedside. Patient has been eating poorly since before her previous admission (4/17-5/9). She has had taste changes recently and doesn't want anything to eat. She does not drink Ensure/Boost supplements because she is afraid she will have an IBS flare if she drinks Ensure. We discussed other supplement options. Willing to try magic cups with meals. She also likes chocolate pudding, will provide with meals.  ? ?On previous admission, patient had a Cortrak tube placed and was receiving supplemental EN to meet 100% of estimated needs for approximately 2 weeks.  ? ?Labs reviewed. Na 134, K 3 ?CBG: [id number] ? ?Medications reviewed and include Keppra, Levophed. ?IVF: D5LR at 125 ml/h ? ?Suspect muscle depletion is related to multiple sclerosis. Patient with worsened muscle and subcutaneous fat depletions since last admission. This likely related to poor intake.   ? ?Patient has had 5% weight loss over the past 2 weeks, which is significant. Per discussion with husband, she has been eating < 50% of her estimated energy requirement since discharge 8 days ago. She also has severe depletion of subcutaneous fat mass. Patient meets criteria for severe malnutrition. ? ?NUTRITION - FOCUSED PHYSICAL EXAM: ? ?Flowsheet Row Most Recent Value  ?Orbital Region Severe depletion  ?Upper Arm Region Severe depletion  ?Thoracic and Lumbar Region Severe depletion  ?Buccal Region Severe depletion  ?Temple Region Severe depletion  ?Clavicle Bone Region Severe depletion  ?Clavicle and Acromion Bone Region Severe depletion  ?Scapular Bone Region Severe depletion  ?Dorsal Hand Severe depletion  ?Patellar Region Severe depletion  ?Anterior Thigh Region Moderate depletion  ?Posterior Calf Region Unable to assess  ?Edema (RD Assessment) Severe  ?Hair Reviewed  ?Eyes Reviewed  ?Mouth Reviewed  ?Skin Reviewed  ?Nails Reviewed  ? ?  ? ? ?Diet Order:   ?Diet Order   ? ?       ?  DIET DYS 3 Room service appropriate? Yes with Assist; Fluid consistency: Thin  Diet effective now       ?  ? ?  ?  ? ?  ? ? ?EDUCATION NEEDS:  ? ?Not appropriate for education at this time ? ?Skin:  Skin Assessment: Skin Integrity Issues: ?Skin Integrity Issues:: Stage III, Other (Comment) ?Stage III: sacrum ?Other: BLE cellulitis; R foot non-pressure wound; MASD to sacrum & coccyx ? ?Last BM:  No BM documented ? ?Height:  ? ?Ht Readings from Last 1 Encounters:  ?04/26/21 5\' 4"  (1.626 m)  ? ? ?Weight:  ? ?Wt  Readings from Last 1 Encounters:  ?05/26/21 58.2 kg  ?05/12/21  61.2 kg ? ?BMI:  Body mass index is 22.02 kg/m?. ? ?Estimated Nutritional Needs:  ? ?Kcal:  1700-1900 ? ?Protein:  90-110 gm ? ?Fluid:  1.7-1.9 L ? ? ? ?Lucas Mallow RD, LDN, CNSC ?Please refer to Amion for contact information.                                                       ? ?

## 2021-05-26 NOTE — H&P (Signed)
? ?NAME:  [name], MRN:  [id number], DOB:  [birth year], LOS: 0 ?ADMISSION DATE:  05/25/2021, CONSULTATION DATE:  05/25/21 ?REFERRING MD:  Karle Starch, ED, CHIEF COMPLAINT:  LE wounds   ? ?History of Present Illness:  ?61 yo woman with multiple sclerosis (ambulatory with walker), Anklyosing spondylitis, GERD, Seizure disorder, fibromyalgia, recent admission for weakness and LE swelling, septic shock due to cellulitis of the RLE, Complicated by B PTX, chest tube placements, aspiration PNA.  ?Here after being sent in for worsening LE wounds by her wound nurse.   ? ?She started being treated w/ Ocrelizumab back in Feb. Per pt and family about 4 weeks after began to notice increased RLE swelling, scaling and weakness. ? ?Today initial BP ok , but has had persistent hypotension since then, with maps in low 60s, no improvement with fluids.  Baseline maps in 70s at least.  + 2.5 L in ED  ? ?IN ED received lokelma, cefepime, vanc, started on levophed at around 2am.  ? ?Hyponatremia 128.   ?CO2 only 13 ?Cr 2.7, BUN elevated,  ? ?Pertinent  Medical History  ?MS ?Ankylosing spondylitis ?Dysphagia ?Anemia ?Hyperglycemia ?Stage 2 sacral ulcer/chronic venous statis, Superficial LE ulcers.  ?COPD hx  ? ? ?Significant Hospital Events: ?Including procedures, antibiotic start and stop dates in addition to other pertinent events   ? ? ?Interim History / Subjective:  ? ? ?Objective   ?Blood pressure (!) 98/51, pulse 86, temperature 97.8 ?F (36.6 ?C), resp. rate 15, SpO2 98 %. ?   ?   ? ?Intake/Output Summary (Last 24 hours) at 05/26/2021 0243 ?Last data filed at 05/26/2021 0056 ?Gross per 24 hour  ?Intake 2506.35 ml  ?Output --  ?Net 2506.35 ml  ? ?There were no vitals filed for this visit. ? ?Examination: ?General: NAD pleasant  ?HENT: NCAT, oral mucosa dry  ?Lungs: CTAB  ?Cardiovascular: RRR no mgr  ?Abdomen: nt, nd, nbs  ?Extremities: bright erythematous wounds on BLE R > L  ?Neuro: a and o x 3  ? ? ?Resolved Hospital Problem list    ? ? ?Assessment & Plan:  ?Hypotension: lactic acidosis.  ?Repeat after fluids.   ?UTI based on UA.  Possible wound infections.   ?Concern for sepsis.   ?Cont antibiotics, pressors for goal MAP 65.  ?Lactic only 1.1 at 1620 will recheck  ?Leukocytosis 13.9  ?Blood cultures pending.  ? ?AKI - cont to monitor ? ?Hyponatremia ?Uremia ?Hypoalbumin (2.6) ?Anemia ? ?MS ? ?Best Practice (right click and "Reselect all SmartList Selections" daily)  ? ?Diet/type: Regular consistency (see orders) ?DVT prophylaxis: prophylactic heparin  ?GI prophylaxis: PPI ?Lines: N/A ?Foley:  N/A ?Code Status:  full code ?Last date of multidisciplinary goals of care discussion []  ? ?Labs   ?CBC: ?Recent Labs  ?Lab 05/25/21 ?1608  ?WBC 13.9*  ?NEUTROABS 12.2*  ?HGB 12.0  ?HCT 36.4  ?MCV 87.7  ?PLT 461*  ? ? ?Basic Metabolic Panel: ?Recent Labs  ?Lab 05/25/21 ?1803  ?NA 128*  ?K 5.3*  ?CL 93*  ?CO2 13*  ?GLUCOSE 76  ?BUN 125*  ?CREATININE 2.70*  ?CALCIUM 7.4*  ? ?GFR: ?Estimated Creatinine Clearance: 18.4 mL/min (A) (by C-G formula based on SCr of 2.7 mg/dL (H)). ?Recent Labs  ?Lab 05/25/21 ?1608 05/25/21 ?1620  ?WBC 13.9*  --   ?LATICACIDVEN 1.2 1.1  ? ? ?Liver Function Tests: ?Recent Labs  ?Lab 05/25/21 ?1803  ?AST 12*  ?ALT 13  ?ALKPHOS 80  ?BILITOT 0.8  ?PROT 5.1*  ?ALBUMIN 2.6*  ? ?  No results for input(s): LIPASE, AMYLASE in the last 168 hours. ?No results for input(s): AMMONIA in the last 168 hours. ? ?ABG ?   ?Component Value Date/Time  ? PHART 7.419 04/28/2021 0257  ? PCO2ART 35.2 04/28/2021 0257  ? PO2ART 73 (L) 04/28/2021 0257  ? HCO3 22.8 04/28/2021 0257  ? TCO2 24 04/28/2021 0257  ? ACIDBASEDEF 1.0 04/28/2021 0257  ? O2SAT 95 04/28/2021 0257  ?  ? ?Coagulation Profile: ?No results for input(s): INR, PROTIME in the last 168 hours. ? ?Cardiac Enzymes: ?No results for input(s): CKTOTAL, CKMB, CKMBINDEX, TROPONINI in the last 168 hours. ? ?HbA1C: ?Hgb A1c MFr Bld  ?Date/Time Value Ref Range Status  ?04/26/2021 01:07 PM 5.0 4.8 - 5.6 %  Final  ?  Comment:  ?  (NOTE) ?Pre diabetes:          5.7%-6.4% ? ?Diabetes:              >6.4% ? ?Glycemic control for   <7.0% ?adults with diabetes ?  ? ? ?CBG: ?No results for input(s): GLUCAP in the last 168 hours. ? ?Review of Systems:   ?Review of Systems  ?Constitutional:  Negative for chills and fever.  ?HENT:  Negative for hearing loss.   ?Eyes:  Negative for blurred vision.  ?Respiratory:  Negative for cough.   ?Cardiovascular:  Negative for chest pain.  ?Gastrointestinal:  Negative for heartburn.  ?Genitourinary:  Negative for dysuria.  ?Musculoskeletal:  Negative for myalgias.  ?Skin:  Negative for rash.  ?Neurological:  Negative for dizziness.  ?Endo/Heme/Allergies:  Does not bruise/bleed easily.  ?Psychiatric/Behavioral:  Negative for depression.   ? ? ?Past Medical History:  ?She,  has a past medical history of Allergy, Anemia, GERD (gastroesophageal reflux disease), HLA B27 (HLA B27 positive), UTI (urinary tract infection), Hyperplastic colon polyp, Hypertension, IBS (irritable bowel syndrome), Multiple sclerosis ([place]), and Seizures ([place]).  ? ?Surgical History:  ? ?Past Surgical History:  ?Procedure Laterality Date  ? APPENDECTOMY    ? BLADDER SURGERY  2007  ? CESAREAN SECTION    ? x 2   ? ORIF TOE FRACTURE Left 06/27/2013  ? Procedure: OPEN REDUCTION INTERNAL FIXATION (ORIF) LEFT FIFTH METATARSAL;  Surgeon: Wylene Simmer, MD;  Location: [facility];  Service: Orthopedics;  Laterality: Left;  ? PARTIAL HYSTERECTOMY  2006  ? TONSILLECTOMY    ? TUBAL LIGATION    ?  ? ?Social History:  ? reports that she has been smoking cigarettes. She has a 30.00 pack-year smoking history. She has never used smokeless tobacco. She reports current alcohol use of about 1.0 standard drink per week. She reports that she does not use drugs.  ? ?Family History:  ?Her family history includes Breast cancer in her maternal grandmother; Diabetes in her maternal aunt; Heart disease in her father; Irritable bowel  syndrome in her brother and sister. There is no history of Colon cancer.  ? ?Allergies ?Allergies  ?Allergen Reactions  ? Lamictal [Lamotrigine] Rash  ? Other   ?  Cats: Rash  ?Shrimp scampi sauce: rash  ? Tramadol   ?  Lowers seizure threshold  ?  ? ?Home Medications  ?Prior to Admission medications   ?Medication Sig Start Date End Date Taking? Authorizing Provider  ?Cholecalciferol (VITAMIN D3) 25 MCG (1000 UT) CAPS Take 1,000 Units by mouth daily.   Yes [provider]  ?gabapentin (NEURONTIN) 300 MG capsule Take 300 mg by mouth 2 (two) times daily. 02/15/21  Yes [provider]  ?  hydrOXYzine (ATARAX) 25 MG tablet Take 25 mg by mouth 2 (two) times daily as needed for anxiety. 04/01/21  Yes [provider]  ?ibuprofen (ADVIL,MOTRIN) 200 MG tablet Take 600 mg by mouth every 6 (six) hours as needed for moderate pain.   Yes [provider]  ?levETIRAcetam (KEPPRA) 500 MG tablet Take 1 tablet (500 mg total) by mouth 2 (two) times daily. 10/19/12  Yes Marcial Pacas, MD  ?Omega-3 Fatty Acids (FISH OIL) 1000 MG CAPS Take 1,000 capsules by mouth daily.   Yes [provider]  ?Turmeric (QC TUMERIC COMPLEX PO) Take 475 mg by mouth daily.   Yes [provider]  ?  ? ?Critical care time: 45 min ?  ? ? ? ? ? ?

## 2021-05-27 DIAGNOSIS — L03119 Cellulitis of unspecified part of limb: Secondary | ICD-10-CM

## 2021-05-27 DIAGNOSIS — E43 Unspecified severe protein-calorie malnutrition: Secondary | ICD-10-CM | POA: Insufficient documentation

## 2021-05-27 DIAGNOSIS — N179 Acute kidney failure, unspecified: Secondary | ICD-10-CM | POA: Diagnosis not present

## 2021-05-27 DIAGNOSIS — R6521 Severe sepsis with septic shock: Secondary | ICD-10-CM | POA: Diagnosis not present

## 2021-05-27 DIAGNOSIS — A419 Sepsis, unspecified organism: Secondary | ICD-10-CM | POA: Diagnosis not present

## 2021-05-27 LAB — CBC
HCT: 28.1 % — ABNORMAL LOW (ref 36.0–46.0)
Hemoglobin: 9.2 g/dL — ABNORMAL LOW (ref 12.0–15.0)
MCH: 28.1 pg (ref 26.0–34.0)
MCHC: 32.7 g/dL (ref 30.0–36.0)
MCV: 85.9 fL (ref 80.0–100.0)
Platelets: 341 10*3/uL (ref 150–400)
RBC: 3.27 MIL/uL — ABNORMAL LOW (ref 3.87–5.11)
RDW: 16.3 % — ABNORMAL HIGH (ref 11.5–15.5)
WBC: 5.6 10*3/uL (ref 4.0–10.5)
nRBC: 0 % (ref 0.0–0.2)

## 2021-05-27 LAB — GLUCOSE, CAPILLARY
Glucose-Capillary: 110 mg/dL — ABNORMAL HIGH (ref 70–99)
Glucose-Capillary: 122 mg/dL — ABNORMAL HIGH (ref 70–99)
Glucose-Capillary: 127 mg/dL — ABNORMAL HIGH (ref 70–99)
Glucose-Capillary: 134 mg/dL — ABNORMAL HIGH (ref 70–99)
Glucose-Capillary: 89 mg/dL (ref 70–99)

## 2021-05-27 LAB — BASIC METABOLIC PANEL
Anion gap: 6 (ref 5–15)
Anion gap: 7 (ref 5–15)
BUN: 35 mg/dL — ABNORMAL HIGH (ref 8–23)
BUN: 28 mg/dL — ABNORMAL HIGH (ref 8–23)
CO2: 21 mmol/L — ABNORMAL LOW (ref 22–32)
CO2: 21 mmol/L — ABNORMAL LOW (ref 22–32)
Calcium: 8.2 mg/dL — ABNORMAL LOW (ref 8.9–10.3)
Calcium: 8.6 mg/dL — ABNORMAL LOW (ref 8.9–10.3)
Chloride: 111 mmol/L (ref 98–111)
Chloride: 112 mmol/L — ABNORMAL HIGH (ref 98–111)
Creatinine, Ser: 0.49 mg/dL (ref 0.44–1.00)
Creatinine, Ser: 0.54 mg/dL (ref 0.44–1.00)
GFR, Estimated: >60 mL/min (ref >60)
GFR, Estimated: >60 mL/min (ref >60)
Glucose, Bld: 124 mg/dL — ABNORMAL HIGH (ref 70–99)
Glucose, Bld: 128 mg/dL — ABNORMAL HIGH (ref 70–99)
Potassium: 2.4 mmol/L — CL (ref 3.5–5.1)
Potassium: 3.7 mmol/L (ref 3.5–5.1)
Sodium: 138 mmol/L (ref 135–145)
Sodium: 140 mmol/L (ref 135–145)

## 2021-05-27 LAB — MAGNESIUM: Magnesium: 1.5 mg/dL — ABNORMAL LOW (ref 1.7–2.4)

## 2021-05-27 MED ORDER — OXYCODONE HCL 5 MG PO TABS
5.0000 mg | ORAL_TABLET | ORAL | Status: DC | PRN
  Administered 2021-05-27 – 2021-05-30 (×9): 5 mg via ORAL
  Filled 2021-05-27 (×9): qty 1

## 2021-05-27 MED ORDER — MIDODRINE HCL 5 MG PO TABS
10.0000 mg | ORAL_TABLET | Freq: Two times a day (BID) | ORAL | Status: DC
Start: 2021-05-27 — End: 2021-05-28
  Administered 2021-05-27 – 2021-05-28 (×2): 10 mg via ORAL
  Filled 2021-05-27 (×2): qty 2

## 2021-05-27 MED ORDER — POTASSIUM CHLORIDE 10 MEQ/100ML IV SOLN
10.0000 meq | INTRAVENOUS | Status: DC
  Administered 2021-05-27: 10 meq via INTRAVENOUS
  Filled 2021-05-27 (×4): qty 100

## 2021-05-27 MED ORDER — POTASSIUM CHLORIDE 20 MEQ PO PACK
40.0000 meq | PACK | Freq: Once | ORAL | Status: AC
  Administered 2021-05-27: 40 meq via ORAL
  Filled 2021-05-27: qty 2

## 2021-05-27 MED ORDER — POTASSIUM CHLORIDE CRYS ER 20 MEQ PO TBCR
60.0000 meq | EXTENDED_RELEASE_TABLET | Freq: Once | ORAL | Status: AC
  Administered 2021-05-27: 60 meq via ORAL
  Filled 2021-05-27: qty 3

## 2021-05-27 MED ORDER — SODIUM CHLORIDE 0.9 % IV SOLN
3.0000 g | Freq: Three times a day (TID) | INTRAVENOUS | Status: DC
  Administered 2021-05-27 – 2021-05-31 (×13): 3 g via INTRAVENOUS
  Filled 2021-05-27 (×15): qty 8

## 2021-05-27 MED ORDER — MIDODRINE HCL 5 MG PO TABS: 5.0000 mg | ORAL_TABLET | Freq: Three times a day (TID) | ORAL | Status: DC

## 2021-05-27 MED ORDER — MAGNESIUM SULFATE 2 GM/50ML IV SOLN
2.0000 g | Freq: Once | INTRAVENOUS | Status: AC
  Administered 2021-05-27: 2 g via INTRAVENOUS
  Filled 2021-05-27: qty 50

## 2021-05-27 MED ORDER — POTASSIUM CHLORIDE 10 MEQ/100ML IV SOLN
10.0000 meq | INTRAVENOUS | Status: DC
  Administered 2021-05-27 (×2): 10 meq via INTRAVENOUS

## 2021-05-27 MED ORDER — WHITE PETROLATUM EX OINT
TOPICAL_OINTMENT | CUTANEOUS | Status: DC | PRN
  Filled 2021-05-27 (×2): qty 28.35

## 2021-05-27 MED ORDER — POTASSIUM CHLORIDE 10 MEQ/100ML IV SOLN
10.0000 meq | Freq: Once | INTRAVENOUS | Status: AC
  Administered 2021-05-27: 10 meq via INTRAVENOUS

## 2021-05-27 MED ORDER — VANCOMYCIN HCL IN DEXTROSE 1-5 GM/200ML-% IV SOLN
1000.0000 mg | Freq: Two times a day (BID) | INTRAVENOUS | Status: DC
  Administered 2021-05-27 (×2): 1000 mg via INTRAVENOUS
  Filled 2021-05-27 (×3): qty 200

## 2021-05-27 MED ORDER — POTASSIUM CHLORIDE 10 MEQ/100ML IV SOLN: 10.0000 meq | INTRAVENOUS | Status: DC

## 2021-05-27 NOTE — Progress Notes (Signed)
Pharmacy Antibiotic Note  [name] is a 61 y.o. female admitted on 05/25/2021 with cellulitis/sepsis  Pharmacy has been consulted for Unasyn and Vanc dosing.  Vancomycin 1000 mg IV Q 12 hrs. Goal AUC 400-550. Expected AUC: 522 SCr used: 0.5  Plan: Start Unasyn 3 gm IV q8hr Start Vanc 1000 mg IV q12hr Monitor renal function, C&S and vanc levels as needed  Weight: 61.1 kg (134 lb 11.2 oz)  Temp (24hrs), Avg:97.6 F (36.4 C), Min:97.4 F (36.3 C), Max:97.8 F (36.6 C)  Recent Labs  Lab 05/25/21 1608 05/25/21 1620 05/25/21 1803 05/26/21 0330 05/26/21 0624 05/26/21 0806 05/27/21 0726  WBC 13.9*  --   --  7.0  --  8.9 5.6  CREATININE  --   --  2.70* 1.02*  --  1.11*  1.14* 0.49  LATICACIDVEN 1.2 1.1  --  8.9* 1.9 1.3  --     Estimated Creatinine Clearance: 62.2 mL/min (by C-G formula based on SCr of 0.49 mg/dL).    Allergies  Allergen Reactions   Lamictal [Lamotrigine] Rash   Other     Cats: Rash  Shrimp scampi sauce: rash   Tramadol     Lowers seizure threshold    Antimicrobials this admission: Cefepime 5/16 >> 5/18 Vanc 5/18 >> Unasyn 5/18 >>   Thank you for allowing pharmacy to be a part of this patient's care.  Jeanella Cara, PharmD, [facility] Clinical Pharmacist Please see AMION for all Pharmacists' Contact Phone Numbers 05/27/2021, 9:34 AM

## 2021-05-27 NOTE — Progress Notes (Signed)
NAME:  [name], MRN:  [id number], DOB:  1960/02/16, LOS: 0 ADMISSION DATE:  05/25/2021, CONSULTATION DATE:  05/25/21 REFERRING MD:  [name], ED, CHIEF COMPLAINT:  LE wounds    History of Present Illness:  61 yo woman with multiple sclerosis (ambulatory with walker), anklyosing spondylitis, GERD, seizure disorder, fibromyalgia, recent admission 4/17-5/9 for septic shock due to cellulitis of the RLE c/b aspiration pneumonia and bilateral pneumothoraces s/p chest tube placements presents here after being sent in for worsening LE wounds by her wound nurse.  She started being treated w/ Ocrelizumab back in Feb. Per pt and family about 4 weeks after began to notice increased RLE swelling, scaling and weakness. She was then hospitalized for septic shock with multiple complications. She was discharged home from the hospital with home health.  Since then she's had worsening lower extremity swelling, pain, and redness. Today, she arrives to ED after wound care nurse evaluated her wounds.   Today initial BP ok , but has had persistent hypotension since then, with maps in low 60s, no improvement with fluids.  Baseline maps in 70s at least.  + 2.5 L in ED  In ED received lokelma, cefepime, vanc, started on levophed at around 2am. PCCM consulted for admission given hypotension despite fluid resuscitation and concern for septic shock.   Pertinent  Medical History  Multiple sclernosis Ankylosing spondylitis GERD Seizure disorder Stage 2 sacral ulcer/chronic venous statis, Superficial LE ulcers.  COPD   Significant Hospital Events: Including procedures, antibiotic start and stop dates in addition to other pertinent events   5/17 admitted, transferred to ICU for pressor support  Interim History / Subjective:  Yesterday was able to eat some. Had some pain and cramps yesterday and today in lower extremities. Otherwise no acute complaints this morning.   Objective   Blood pressure (!) 104/54, pulse 85,  temperature (!) 97.4 F (36.3 C), temperature source Oral, resp. rate 15, weight 58.2 kg, SpO2 98 %.        Intake/Output Summary (Last 24 hours) at 05/26/2021 0939 Last data filed at 05/26/2021 0700 Gross per 24 hour  Intake 3525.23 ml  Output 600 ml  Net 2925.23 ml   Filed Weights   05/26/21 0432  Weight: 58.2 kg   Examination: General: Resting in bed comfortably in no acute distress HENT: Normocephalic, atraumatic. Dry mucous membranes. Lungs: Normal respiratory effort on room air. Clear to ausculation bilaterally. Cardiovascular: Regular rate, rhythm. No murmurs. Distal pulses 2+ bilaterally. Abdomen: Soft, non-tender, non-distended. Normoactive bowel sounds. Extremities: Bilateral feet very erythematous, warm. Lower legs wrapped with gauze. No satellite lesions or crepitus appreciated. Neuro: Awake, alert, conversing appropriately. Grossly non-focal.  Resolved Hospital Problems:  Acute non-oliguric renal failure Anion gap metabolic acidosis Hypovolemic hyponatremia  Assessment & Plan:  #Septic shock - cellulitis, urinary tract infection Patient still requiring low-dose peripheral pressors after >24h of antibiotics, continuous intravenous fluids. Cultures no growth to date. Renal failure has resolved, she is now back to baseline. Bicarb improved to 21. This morning, she does report pain and cramps, will start pain regimen. She still appears hypovolemic on exam, likely contributing to hypotension. Will switch antibiotics, start midodrine. Can consider addition of steroids tomorrow if continues to require pressors.  - Peripheral levophed w/ goal MAP >65 - Continue D5-LR 125cc/hr - Start Unasyn, vancomycin - Start midodrine 10mg  twice daily - WOC, appreciate assistance - Oxycodone 5mg  every 4 hours as needed  #Hypokalemia #Hypomagnesemia Significant hypokalemia today K 2.4 w/ Mg 1.5. Likely due  to poor oral intake, will replete these today. Likely cause of her lower extremity  cramps. Will plan to repeat labs this afternoon.  - Goal K>4, Mg >2 - Repeat labs this PM  #Normocytic anemia Hgb stable. No signs or symptoms of active bleed. Does have elevated RDW, would consider checking iron once acute illness has resolved.  #Hypoalbuminemia Low albumin likely in setting of recent, long hospital course. Will have our dieticians see [name] and make recommendations.   #Seizure disorder - Continue home Keppra  #Multiple sclerosis Started on Ocrelizumab in February, will need to follow-up with outpatient neurology.  Best Practice (right click and "Reselect all SmartList Selections" daily)   Diet/type: DYS3 DVT prophylaxis: prophylactic heparin  GI prophylaxis: PPI Lines: N/A Foley:  N/A Code Status:  full code Last date of multidisciplinary goals of care discussion []   Labs   CBC: Recent Labs  Lab 05/25/21 1608  WBC 13.9*  NEUTROABS 12.2*  HGB 12.0  HCT 36.4  MCV 87.7  PLT 461*    Basic Metabolic Panel: Recent Labs  Lab 05/25/21 1803  NA 128*  K 5.3*  CL 93*  CO2 13*  GLUCOSE 76  BUN 125*  CREATININE 2.70*  CALCIUM 7.4*   GFR: Estimated Creatinine Clearance: 18.4 mL/min (A) (by C-G formula based on SCr of 2.7 mg/dL (H)). Recent Labs  Lab 05/25/21 1608 05/25/21 1620  WBC 13.9*  --   LATICACIDVEN 1.2 1.1    Liver Function Tests: Recent Labs  Lab 05/25/21 1803  AST 12*  ALT 13  ALKPHOS 80  BILITOT 0.8  PROT 5.1*  ALBUMIN 2.6*   No results for input(s): LIPASE, AMYLASE in the last 168 hours. No results for input(s): AMMONIA in the last 168 hours.  ABG    Component Value Date/Time   PHART 7.419 04/28/2021 0257   PCO2ART 35.2 04/28/2021 0257   PO2ART 73 (L) 04/28/2021 0257   HCO3 22.8 04/28/2021 0257   TCO2 24 04/28/2021 0257   ACIDBASEDEF 1.0 04/28/2021 0257   O2SAT 95 04/28/2021 0257     Coagulation Profile: No results for input(s): INR, PROTIME in the last 168 hours.  Cardiac Enzymes: No results for  input(s): CKTOTAL, CKMB, CKMBINDEX, TROPONINI in the last 168 hours.  HbA1C: Hgb A1c MFr Bld  Date/Time Value Ref Range Status  04/26/2021 01:07 PM 5.0 4.8 - 5.6 % Final    Comment:    (NOTE) Pre diabetes:          5.7%-6.4%  Diabetes:              >6.4%  Glycemic control for   <7.0% adults with diabetes    CBG: No results for input(s): GLUCAP in the last 168 hours.  Critical care time: 33 min    Sanjuan Dame, MD Internal Medicine PGY-2 Pager: [phone]

## 2021-05-28 ENCOUNTER — Inpatient Hospital Stay (HOSPITAL_COMMUNITY): Payer: Medicare Other

## 2021-05-28 DIAGNOSIS — R6520 Severe sepsis without septic shock: Secondary | ICD-10-CM | POA: Diagnosis not present

## 2021-05-28 DIAGNOSIS — N179 Acute kidney failure, unspecified: Secondary | ICD-10-CM | POA: Diagnosis not present

## 2021-05-28 DIAGNOSIS — A419 Sepsis, unspecified organism: Secondary | ICD-10-CM | POA: Diagnosis not present

## 2021-05-28 LAB — PHOSPHORUS
Phosphorus: 1.4 mg/dL — ABNORMAL LOW (ref 2.5–4.6)
Phosphorus: 1.5 mg/dL — ABNORMAL LOW (ref 2.5–4.6)

## 2021-05-28 LAB — BASIC METABOLIC PANEL
Anion gap: 4 — ABNORMAL LOW (ref 5–15)
BUN: 19 mg/dL (ref 8–23)
CO2: 22 mmol/L (ref 22–32)
Calcium: 8.5 mg/dL — ABNORMAL LOW (ref 8.9–10.3)
Chloride: 115 mmol/L — ABNORMAL HIGH (ref 98–111)
Creatinine, Ser: 0.51 mg/dL (ref 0.44–1.00)
GFR, Estimated: >60 mL/min (ref >60)
Glucose, Bld: 93 mg/dL (ref 70–99)
Potassium: 4.3 mmol/L (ref 3.5–5.1)
Sodium: 141 mmol/L (ref 135–145)

## 2021-05-28 LAB — CBC
HCT: 28.6 % — ABNORMAL LOW (ref 36.0–46.0)
Hemoglobin: 9.4 g/dL — ABNORMAL LOW (ref 12.0–15.0)
MCH: 28.8 pg (ref 26.0–34.0)
MCHC: 32.9 g/dL (ref 30.0–36.0)
MCV: 87.7 fL (ref 80.0–100.0)
Platelets: 287 10*3/uL (ref 150–400)
RBC: 3.26 MIL/uL — ABNORMAL LOW (ref 3.87–5.11)
RDW: 16.8 % — ABNORMAL HIGH (ref 11.5–15.5)
WBC: 6.4 10*3/uL (ref 4.0–10.5)
nRBC: 0 % (ref 0.0–0.2)

## 2021-05-28 LAB — GLUCOSE, CAPILLARY
Glucose-Capillary: 140 mg/dL — ABNORMAL HIGH (ref 70–99)
Glucose-Capillary: 79 mg/dL (ref 70–99)
Glucose-Capillary: 81 mg/dL (ref 70–99)

## 2021-05-28 LAB — MAGNESIUM: Magnesium: 1.5 mg/dL — ABNORMAL LOW (ref 1.7–2.4)

## 2021-05-28 MED ORDER — MIDODRINE HCL 5 MG PO TABS: 10.0000 mg | ORAL_TABLET | Freq: Three times a day (TID) | ORAL | Status: DC

## 2021-05-28 MED ORDER — VANCOMYCIN HCL 1250 MG/250ML IV SOLN: 1250.0000 mg | INTRAVENOUS | Status: DC

## 2021-05-28 MED ORDER — LINEZOLID 600 MG/300ML IV SOLN
600.0000 mg | Freq: Two times a day (BID) | INTRAVENOUS | Status: DC
  Administered 2021-05-28 – 2021-05-31 (×6): 600 mg via INTRAVENOUS
  Filled 2021-05-28 (×8): qty 300

## 2021-05-28 MED ORDER — METHYLPREDNISOLONE SODIUM SUCC 125 MG IJ SOLR: 100.0000 mg | Freq: Three times a day (TID) | INTRAMUSCULAR | Status: DC

## 2021-05-28 MED ORDER — MIDODRINE HCL 5 MG PO TABS
10.0000 mg | ORAL_TABLET | Freq: Three times a day (TID) | ORAL | Status: DC
  Administered 2021-05-28 – 2021-06-03 (×15): 10 mg via ORAL
  Filled 2021-05-28 (×15): qty 2

## 2021-05-28 MED ORDER — JUVEN PO PACK
1.0000 | PACK | Freq: Two times a day (BID) | ORAL | Status: DC
  Administered 2021-05-28 – 2021-06-03 (×11): 1
  Filled 2021-05-28 (×14): qty 1

## 2021-05-28 MED ORDER — OSMOLITE 1.5 CAL PO LIQD
1000.0000 mL | ORAL | Status: DC
  Administered 2021-05-28 – 2021-05-31 (×4): 1000 mL
  Filled 2021-05-28 (×4): qty 1000

## 2021-05-28 MED ORDER — PROSOURCE TF PO LIQD
45.0000 mL | Freq: Three times a day (TID) | ORAL | Status: DC
  Administered 2021-05-28 – 2021-05-31 (×9): 45 mL
  Filled 2021-05-28 (×8): qty 45

## 2021-05-28 MED ORDER — MAGNESIUM SULFATE 4 GM/100ML IV SOLN
4.0000 g | Freq: Once | INTRAVENOUS | Status: AC
  Administered 2021-05-28: 4 g via INTRAVENOUS
  Filled 2021-05-28: qty 100

## 2021-05-28 MED ORDER — HYDROCORTISONE SOD SUC (PF) 100 MG IJ SOLR
100.0000 mg | Freq: Two times a day (BID) | INTRAMUSCULAR | Status: AC
  Administered 2021-05-28 (×2): 100 mg via INTRAVENOUS
  Filled 2021-05-28 (×2): qty 2

## 2021-05-28 NOTE — Progress Notes (Addendum)
Nutrition Follow-up  DOCUMENTATION CODES:   Severe malnutrition in context of acute illness/injury  INTERVENTION:   Initiate tube feeding via Cortrak: Osmolite 1.5 at 25 ml/h, increase by 10 ml every 4 hours to goal rate of 45 ml/h (1080 ml per day). Prosource TF 45 ml TID.  Provides 1740 kcal, 101 gm protein, 823 ml free water daily.  D/C Magic cup, patient is not eating.  Continue pudding TID with meals.  Continue MVI with minerals daily.  Monitor magnesium, potassium, and phosphorus BID for at least 3 days, MD to replete as needed, as pt is at risk for refeeding syndrome given severe malnutrition with minimal oral intake.   Juven 1 packet BID via tube, each packet provides 80 calories, 8 grams of carbohydrate, 2.5  grams of protein (collagen), 7 grams of L-arginine and 7 grams of L-glutamine; supplement contains CaHMB, Vitamins C, E, B12 and Zinc to promote wound healing.  NUTRITION DIAGNOSIS:   Severe Malnutrition related to acute illness (sepsis, cellulitis causing poor appetite/intake) as evidenced by energy intake < or equal to 50% for > or equal to 5 days, severe fat depletion.  Ongoing   GOAL:   Patient will meet greater than or equal to 90% of their needs  Progressing  MONITOR:   PO intake, Supplement acceptance, Labs, Skin  REASON FOR ASSESSMENT:   Rounds    ASSESSMENT:   61 yo female admitted with BLE cellulitis, hypotension, UTI. PMH includes multiple sclerosis, ankylosing spondylitis, GERD, seizure disorder, fibromyalgia, recent admission for cellulitis BLE and septic shock.  Discussed patient in ICU rounds and with RN today.  SLP following. Patient is currently on a dysphagia 3 diet with thin liquids. She has been swallowing okay, but intake is minimal. Meal intakes documented at 25% of breakfast today. Patient is receiving magic cup supplements with meals, but she is not eating them per RN. Will D/C from meal trays. RN, MD, and RD have all discussed  the importance of adequate nutrition with patient on separate occasions. She agreed to having Cortrak tube placed. Cortrak tube was placed today. Awaiting x-ray for confirmation of placement. Received MD Consult for TF initiation and management.  Labs reviewed. Mag 1.5  CBG: 79-81  Medications reviewed and include Solu-cortef, Keppra, Levophed. Received mag sulfate for repletion 5/18 and 5/19.   Diet Order:   Diet Order             DIET DYS 3 Room service appropriate? Yes with Assist; Fluid consistency: Thin  Diet effective now                   EDUCATION NEEDS:   Not appropriate for education at this time  Skin:  Skin Assessment: Skin Integrity Issues: Skin Integrity Issues:: Stage III, Other (Comment) Stage III: sacrum Other: BLE cellulitis; R foot non-pressure wound; MASD to sacrum & coccyx  Last BM:  5/17 type 5  Height:   Ht Readings from Last 1 Encounters:  04/26/21 5\' 4"  (1.626 m)    Weight:   Wt Readings from Last 1 Encounters:  05/27/21 61.1 kg  05/12/21  61.2 kg  BMI:  Body mass index is 23.12 kg/m.  Estimated Nutritional Needs:   Kcal:  1700-1900  Protein:  90-110 gm  Fluid:  1.7-1.9 L    Lucas Mallow RD, LDN, CNSC Please refer to Amion for contact information.

## 2021-05-28 NOTE — Procedures (Signed)
Cortrak  Person Inserting Tube:  Romanda Turrubiates D, RD Tube Type:  Cortrak - 43 inches Tube Size:  10 Tube Location:  Left nare Secured by: Bridle Technique Used to Measure Tube Placement:  Marking at nare/corner of mouth Cortrak Secured At:  64 cm  Cortrak Tube Team Note:  Consult received to place a Cortrak feeding tube.   X-ray is required, abdominal x-ray has been ordered by the Cortrak team. Please confirm tube placement before using the Cortrak tube.   If the tube becomes dislodged please keep the tube and contact the Cortrak team at [email or web address] ([email or web address]) for replacement.  If after hours and replacement cannot be delayed, place a NG tube and confirm placement with an abdominal x-ray.    Ozelle Brubacher, RD, LDN Clinical Dietitian RD pager # available in AMION  After hours/weekend pager # available in AMION 

## 2021-05-28 NOTE — Evaluation (Signed)
Occupational Therapy Evaluation Patient Details Name: [name] MRN: [id number] DOB: 01-09-1961 Today's Date: 05/28/2021   History of Present Illness Pt adm on 5/17 due to worsening cellulitis in RLE and concerns for Septic Shock. Recently hospitalized on 4/17 with septic shock due to RLE cellulitis and PNA. Pt developed acute hypoxic respiratory failure and placed on bipap 4/19. Developed iatrogenic pneumothorax and chest tube placed. Discharged 5/9. PMH - Multiple Sclerosis, seizure, ankylosing spondylitis, fibromyalgia   Clinical Impression   Pt admitted for concerns listed above. PTA pt was having maximal assist from family for transfers to and from [facility] and for all ADL's. Due to weakness from previous hospital stay, pt was unable to self propel her WC. At this time, she is limited by pain, weakness, and balance deficits. She required max A +2 to sit EOB from supine and tolerated 10+ min's sitting EOB with min-max support for balance. Recommending HHOT to maximize independence and safety at home. OT will follow acutely.      Recommendations for follow up therapy are one component of a multi-disciplinary discharge planning process, led by the attending physician.  Recommendations may be updated based on patient status, additional functional criteria and insurance authorization.   Follow Up Recommendations  Home health OT    Assistance Recommended at Discharge Frequent or constant Supervision/Assistance  Patient can return home with the following Two people to help with walking and/or transfers;Two people to help with bathing/dressing/bathroom;Assistance with cooking/housework;Assistance with feeding    Functional Status Assessment  Patient has had a recent decline in their functional status and demonstrates the ability to make significant improvements in function in a reasonable and predictable amount of time.  Equipment Recommendations  Wheelchair (measurements OT);Wheelchair cushion  (measurements OT)    Recommendations for Other Services       Precautions / Restrictions Precautions Precautions: Fall Precaution Comments: watch sats Restrictions Weight Bearing Restrictions: No      Mobility Bed Mobility Overal bed mobility: Needs Assistance Bed Mobility: Supine to Sit, Sit to Supine     Supine to sit: Max assist, +2 for physical assistance Sit to supine: Total assist, +2 for physical assistance   General bed mobility comments: assist for management of LE across bed and trunk to upright, pt able to reach to bedrail to bring hips to EoB    Transfers                   General transfer comment: deferred due to bilateral LE pain in dependent position      Balance Overall balance assessment: Needs assistance Sitting-balance support: Feet supported Sitting balance-Leahy Scale: Poor Sitting balance - Comments: requires posterior support to maintain balance EoB with feet on floor, able to maintain seated balance without outside support for approx 20 sec, mild R sided lean able to correct with verbal cues and min A Postural control: Posterior lean, Right lateral lean                                 ADL either performed or assessed with clinical judgement   ADL Overall ADL's : Needs assistance/impaired Eating/Feeding: Minimal assistance;Bed level   Grooming: Moderate assistance;Bed level   Upper Body Bathing: Moderate assistance;Bed level   Lower Body Bathing: Maximal assistance;+2 for safety/equipment;Bed level   Upper Body Dressing : Moderate assistance;Bed level   Lower Body Dressing: Maximal assistance;+2 for safety/equipment;Bed level       Toileting-  Clothing Manipulation and Hygiene: Maximal assistance;+2 for safety/equipment;Bed level         General ADL Comments: Pt limited to bed level due to pain and weakness present, as well as decreased balance sitting EOB     Vision Baseline Vision/History: 1 Wears  glasses Ability to See in Adequate Light: 0 Adequate Patient Visual Report: No change from baseline Vision Assessment?: No apparent visual deficits     Perception     Praxis      Pertinent Vitals/Pain Pain Assessment Pain Assessment: 0-10 Pain Score: 4  Pain Location: L thigh, L ankle Pain Descriptors / Indicators: Sore Pain Intervention(s): Limited activity within patient's tolerance, Monitored during session, Repositioned     Hand Dominance Right   Extremity/Trunk Assessment Upper Extremity Assessment Upper Extremity Assessment: RUE deficits/detail;LUE deficits/detail RUE Deficits / Details: 3-/5 overall, difficulty moving against gravity, has ROM WFL RUE Sensation: decreased light touch RUE Coordination: decreased fine motor;decreased gross motor LUE Deficits / Details: 3-/5 overall, difficulty moving against gravity, has ROM WFL LUE Sensation: decreased light touch LUE Coordination: decreased fine motor;decreased gross motor   Lower Extremity Assessment Lower Extremity Assessment: Defer to PT evaluation RLE Deficits / Details: Decr dorsiflexion PROM. Strength grossly 2/5 RLE: Unable to fully assess due to pain (cellulitis of lower leg wrapped in gauze) RLE Coordination: decreased fine motor LLE Deficits / Details: decreased dorsiflexion PROM, Strength grossly 2/5 LLE: Unable to fully assess due to pain (cellulitis of lower leg wrapped in gauze) LLE Coordination: decreased fine motor   Cervical / Trunk Assessment Cervical / Trunk Assessment: Kyphotic   Communication Communication Communication: No difficulties   Cognition Arousal/Alertness: Awake/alert Behavior During Therapy: WFL for tasks assessed/performed, Restless Overall Cognitive Status: Impaired/Different from baseline Area of Impairment: Awareness, Attention                   Current Attention Level: Divided   Following Commands: Follows multi-step commands consistently Safety/Judgement:  Decreased awareness of safety, Decreased awareness of deficits Awareness: Emergent Problem Solving: Slow processing, Requires verbal cues, Requires tactile cues General Comments: restless at times and benefits from cues for redirection. appears to be returning to baseline, much improved anxiety with movement     General Comments  Daughter present and supportive    Exercises     Shoulder Instructions      Home Living Family/patient expects to be discharged to:: Private residence Living Arrangements: Spouse/significant other;Children Available Help at Discharge: Family;Available PRN/intermittently Type of Home: House Home Access: Stairs to enter [employer] of Steps: 3-5 Entrance Stairs-Rails: Right Home Layout: One level     Bathroom Shower/Tub: Occupational psychologist: Standard Bathroom Accessibility: Yes   Home Equipment: Rollator (4 wheels);Cane - single point;Shower seat;Grab bars - tub/shower;Wheelchair - manual          Prior Functioning/Environment Prior Level of Function : Needs assist       Physical Assist : ADLs (physical)   ADLs (physical): IADLs Mobility Comments: husband lifting transfer to wheelchair ADLs Comments: Family assists with ADLs.  family complete IADLs        OT Problem List: Decreased strength;Decreased activity tolerance;Impaired balance (sitting and/or standing);Cardiopulmonary status limiting activity;Decreased cognition;Decreased safety awareness      OT Treatment/Interventions: Self-care/ADL training;Therapeutic exercise;Energy conservation;DME and/or AE instruction;Therapeutic activities;Patient/family education;Balance training    OT Goals(Current goals can be found in the care plan section) Acute Rehab OT Goals Patient Stated Goal: To get stronger OT Goal Formulation: With patient Time For Goal Achievement:  06/11/21 Potential to Achieve Goals: Good ADL Goals Pt Will Perform Eating: with modified  independence;with adaptive utensils;sitting Pt Will Perform Grooming: with modified independence;with adaptive equipment;sitting Pt Will Perform Upper Body Bathing: with min guard assist;sitting Pt Will Perform Lower Body Bathing: with min assist;sitting/lateral leans Pt Will Perform Upper Body Dressing: with min guard assist;sitting Pt Will Perform Lower Body Dressing: with min assist;sitting/lateral leans;with adaptive equipment Pt Will Transfer to Toilet: with min guard assist;stand pivot transfer Pt Will Perform Toileting - Clothing Manipulation and hygiene: with min assist;sitting/lateral leans  OT Frequency: Min 2X/week    Co-evaluation PT/OT/SLP Co-Evaluation/Treatment: Yes Reason for Co-Treatment: For patient/therapist safety;To address functional/ADL transfers PT goals addressed during session: Mobility/safety with mobility OT goals addressed during session: Strengthening/ROM      AM-PAC OT "6 Clicks" Daily Activity     Outcome Measure Help from another person eating meals?: A Lot Help from another person taking care of personal grooming?: A Lot Help from another person toileting, which includes using toliet, bedpan, or urinal?: Total Help from another person bathing (including washing, rinsing, drying)?: A Lot Help from another person to put on and taking off regular upper body clothing?: A Lot Help from another person to put on and taking off regular lower body clothing?: A Lot 6 Click Score: 11   End of Session    Activity Tolerance:   Patient left:    OT Visit Diagnosis: Unsteadiness on feet (R26.81);Other abnormalities of gait and mobility (R26.89);Muscle weakness (generalized) (M62.81)                Time: [id number] OT Time Calculation (min): 46 min Charges:  OT General Charges $OT Visit: 1 Visit OT Evaluation $OT Eval Moderate Complexity: [place]., OTR/L Acute Rehabilitation  Kermitt Harjo Elane Sharece Fleischhacker 05/28/2021, 10:30 AM

## 2021-05-28 NOTE — Progress Notes (Signed)
[facility] ADULT ICU REPLACEMENT PROTOCOL   The patient does apply for the [facility] Adult ICU Electrolyte Replacment Protocol based on the criteria listed below:   1.Exclusion criteria: TCTS patients, ECMO patients, and Dialysis patients 2. Is GFR >/= 30 ml/min? Yes.    Patient's GFR today is >60 3. Is SCr </= 2? Yes.   Patient's SCr is 0.51 mg/dL 4. Did SCr increase >/= 0.5 in 24 hours? No. 5.Pt's weight >40kg  Yes.   6. Abnormal electrolyte(s):   Mg 1.5  7. Electrolytes replaced per protocol 8.  Call MD STAT for K+ </= 2.5, Phos </= 1, or Mag </= 1 Physician:  S. Rosie Fate R Seville Brick 05/28/2021 5:29 AM

## 2021-05-28 NOTE — Progress Notes (Signed)
SLP Cancellation Note  Patient Details Name: [name] MRN: [id number] DOB: 1960/03/16   Cancelled treatment:       Reason Eval/Treat Not Completed: Other (comment) Pt working with PT/OT. She says swallowing has been going well. Will f/u as able.    Mahala Menghini., M.A. CCC-SLP Acute Rehabilitation Services Office [phone]  Secure chat preferred  05/28/2021, 8:54 AM

## 2021-05-28 NOTE — Evaluation (Signed)
Physical Therapy Evaluation Patient Details Name: [name] MRN: [id number] DOB: [birth year] Today's Date: 05/28/2021  History of Present Illness  Pt adm 4/17 with septic shock due to RLE cellulitis and PNA. Pt developed acute hypoxic respiratory failure and placed on bipap 4/19. Developed iatrogenic pneumothorax and chest tube placed. PMH - Multiple Sclerosis, seizure, ankylosing spondylitis, fibromyalgia  Clinical Impression  Pt reports that she was making progress with HHPT and able to have less fear with husband assist to pivot to wheelchair. Pt was requires assist for wheelchair propulsion and ADLs and iADLs. Pt currently limited in safe mobility by pain in bilateral lower legs at site of cellulitis, and L thigh and ankle especially with movement, in presence of global weakness and associated decreased balance. Pt requires maxAx2 to get to EoB and total A to return. Pt able to tolerate 10+ min of seated balance EoB with ~20 sec of static sitting without outside assist. PT recommending return to HHPT at discharge. PT will continue to follow acutely.      Recommendations for follow up therapy are one component of a multi-disciplinary discharge planning process, led by the attending physician.  Recommendations may be updated based on patient status, additional functional criteria and insurance authorization.  Follow Up Recommendations Home health PT    Assistance Recommended at Discharge Frequent or constant Supervision/Assistance  Patient can return home with the following  Two people to help with walking and/or transfers;A lot of help with bathing/dressing/bathroom;Assistance with cooking/housework;Direct supervision/assist for medications management;Direct supervision/assist for financial management;Assist for transportation;Help with stairs or ramp for entrance    Equipment Recommendations  Has necessary equipment     Functional Status Assessment Patient has had a recent decline in  their functional status and demonstrates the ability to make significant improvements in function in a reasonable and predictable amount of time.     Precautions / Restrictions Precautions Precautions: Fall Precaution Comments: watch sats Restrictions Weight Bearing Restrictions: No      Mobility  Bed Mobility Overal bed mobility: Needs Assistance Bed Mobility: Supine to Sit, Sit to Supine     Supine to sit: Max assist, +2 for physical assistance Sit to supine: Total assist, +2 for physical assistance   General bed mobility comments: assist for management of LE across bed and trunk to upright, pt able to reach to bedrail to bring hips to EoB    Transfers                   General transfer comment: deferred due to bilateral LE pain in dependent position        Balance Overall balance assessment: Needs assistance Sitting-balance support: Feet supported Sitting balance-Leahy Scale: Poor Sitting balance - Comments: requires posterior support to maintain balance EoB with feet on floor, able to maintain seated balance without outside support for approx 20 sec Postural control: Posterior lean                                   Pertinent Vitals/Pain Pain Assessment Pain Assessment: 0-10 Pain Score: 4  Pain Location: L thigh, L ankle Pain Descriptors / Indicators: Sore Pain Intervention(s): Limited activity within patient's tolerance, Monitored during session, Repositioned    Home Living Family/patient expects to be discharged to:: Private residence Living Arrangements: Spouse/significant other;Children Available Help at Discharge: Family;Available PRN/intermittently Type of Home: House Home Access: Stairs to enter Entrance Stairs-Rails: Right Entrance Stairs-Number of Steps: 3-5  Home Layout: One level Home Equipment: Rollator (4 wheels);Cane - single point;Shower seat;Grab bars - tub/shower;Wheelchair - manual      Prior Function Prior Level  of Function : Needs assist       Physical Assist : ADLs (physical)   ADLs (physical): IADLs Mobility Comments: husband lifting transfer to wheelchair ADLs Comments: Family assists with ADLs.  family complete IADLs     Hand Dominance   Dominant Hand: Right    Extremity/Trunk Assessment   Upper Extremity Assessment Upper Extremity Assessment: Defer to OT evaluation    Lower Extremity Assessment Lower Extremity Assessment: RLE deficits/detail;LLE deficits/detail RLE Deficits / Details: Decr dorsiflexion PROM. Strength grossly 2/5 RLE: Unable to fully assess due to pain (cellulitis of lower leg wrapped in gauze) RLE Coordination: decreased fine motor LLE Deficits / Details: decreased dorsiflexion PROM, Strength grossly 2/5 LLE: Unable to fully assess due to pain (cellulitis of lower leg wrapped in gauze) LLE Coordination: decreased fine motor    Cervical / Trunk Assessment Cervical / Trunk Assessment: Kyphotic  Communication   Communication: No difficulties  Cognition Arousal/Alertness: Awake/alert Behavior During Therapy: WFL for tasks assessed/performed, Restless Overall Cognitive Status: Impaired/Different from baseline Area of Impairment: Awareness, Attention                   Current Attention Level: Divided   Following Commands: Follows multi-step commands consistently Safety/Judgement: Decreased awareness of safety, Decreased awareness of deficits Awareness: Emergent Problem Solving: Slow processing, Requires verbal cues, Requires tactile cues General Comments: restless at times and benefits from cues for redirection. appears to be returning to baseline, much improved anxiety with movement        General Comments General comments (skin integrity, edema, etc.): Daughter in room, BP 108/ bilateral lower legs wrapped in clean dry gauze    Exercises General Exercises - Lower Extremity Ankle Circles/Pumps: AAROM, Both, Supine, 15 reps, AROM    Assessment/Plan    PT Assessment Patient needs continued PT services  PT Problem List Decreased strength;Decreased range of motion;Decreased activity tolerance;Decreased balance;Decreased mobility;Cardiopulmonary status limiting activity;Pain       PT Treatment Interventions DME instruction;Gait training;Functional mobility training;Therapeutic activities;Therapeutic exercise;Balance training;Patient/family education    PT Goals (Current goals can be found in the Care Plan section)  Acute Rehab PT Goals Patient Stated Goal: to continue HHPT PT Goal Formulation: With patient Time For Goal Achievement: 05/31/21 Potential to Achieve Goals: Fair    Frequency Min 3X/week     Co-evaluation PT/OT/SLP Co-Evaluation/Treatment: Yes Reason for Co-Treatment: For patient/therapist safety PT goals addressed during session: Mobility/safety with mobility         AM-PAC PT "6 Clicks" Mobility  Outcome Measure Help needed turning from your back to your side while in a flat bed without using bedrails?: A Little Help needed moving from lying on your back to sitting on the side of a flat bed without using bedrails?: A Lot Help needed moving to and from a bed to a chair (including a wheelchair)?: A Lot Help needed standing up from a chair using your arms (e.g., wheelchair or bedside chair)?: A Lot Help needed to walk in hospital room?: Total Help needed climbing 3-5 steps with a railing? : Total 6 Click Score: 11    End of Session Equipment Utilized During Treatment: Gait belt Activity Tolerance: Patient tolerated treatment well Patient left: with call bell/phone within reach;in bed;with bed alarm set;with family/visitor present Nurse Communication: Mobility status PT Visit Diagnosis: Other abnormalities of gait and mobility (R26.89);Muscle  weakness (generalized) (M62.81);Pain Pain - Right/Left: Left Pain - part of body: Leg    Time: 8657-8469 PT Time Calculation (min) (ACUTE ONLY): 46  min   Charges:   PT Evaluation $PT Eval Moderate Complexity: 1 Mod PT Treatments $Therapeutic Activity: 8-22 mins        [name] B. Beverely Risen PT, DPT Acute Rehabilitation Services Please use secure chat or  Call Office [phone]   Elon Alas [facility] 05/28/2021, 9:47 AM

## 2021-05-28 NOTE — Progress Notes (Addendum)
Pharmacy Antibiotic Note  [name] is a 61 y.o. female admitted on 05/25/2021 with cellulitis/sepsis  Pharmacy has been consulted for Unasyn and Vanc dosing.  Patient with severe malnutrition and muscle depletion. Will adjust dosing using SCr of 0.8 due to this.   Plan: Adjust Vancomycin down to 1250 mg IV every 24 hours (using SCr 0.8, eAUC 507) Continue Unasyn 3 gm IV q8hr Monitor renal function, C&S and vanc levels as needed *Consider changing Vancomycin to Zyvox  Addendum:  Discussed with Dr. Judeth Horn - changing Vancomycin to Zyvox.  Discontinue Vancomycin. Zyvox 600mg  IV BID.   Weight: 61.1 kg (134 lb 11.2 oz)  Temp (24hrs), Avg:97.6 F (36.4 C), Min:97.2 F (36.2 C), Max:97.9 F (36.6 C)  Recent Labs  Lab 05/25/21 1608 05/25/21 1620 05/25/21 1803 05/26/21 0330 05/26/21 0624 05/26/21 0806 05/27/21 0726 05/27/21 1406 05/28/21 0218  WBC 13.9*  --   --  7.0  --  8.9 5.6  --  6.4  CREATININE  --   --    < > 1.02*  --  1.11*  1.14* 0.49 0.54 0.51  LATICACIDVEN 1.2 1.1  --  8.9* 1.9 1.3  --   --   --    < > = values in this interval not displayed.    Estimated Creatinine Clearance: 62.2 mL/min (by C-G formula based on SCr of 0.51 mg/dL).    Allergies  Allergen Reactions   Lamictal [Lamotrigine] Rash   Other     Cats: Rash  Shrimp scampi sauce: rash   Tramadol     Lowers seizure threshold    Antimicrobials this admission: Cefepime 5/16 >> 5/18 Vanc 5/18 >> Unasyn 5/18 >>   Thank you for allowing pharmacy to be a part of this patient's care.  6/18, PharmD, BCPS, BCCCP Clinical Pharmacist Please refer to [facility] for [facility] Pharmacy numbers 05/28/2021, 10:33 AM

## 2021-05-28 NOTE — Progress Notes (Signed)
IVT consulted for "IV expires for use."  RN, Roxan Hockey advised of vasopressors policy and advised of usgpiv note made 05/26/21.  If after 72 hours of use, central line access should be obtained.

## 2021-05-28 NOTE — Progress Notes (Signed)
NAME:  [name], MRN:  [id number], DOB:  06-11-1960, LOS: 0 ADMISSION DATE:  05/25/2021, CONSULTATION DATE:  05/25/21 REFERRING MD:  Karle Starch, ED, CHIEF COMPLAINT:  LE wounds    History of Present Illness:  61 yo woman with multiple sclerosis (ambulatory with walker), anklyosing spondylitis, GERD, seizure disorder, fibromyalgia, recent admission 4/17-5/9 for septic shock due to cellulitis of the RLE c/b aspiration pneumonia and bilateral pneumothoraces s/p chest tube placements presents here after being sent in for worsening LE wounds by her wound nurse.  She started being treated w/ Ocrelizumab back in Feb. Per pt and family about 4 weeks after began to notice increased RLE swelling, scaling and weakness. She was then hospitalized for septic shock with multiple complications. She was discharged home from the hospital with home health.  Since then she's had worsening lower extremity swelling, pain, and redness. Today, she arrives to ED after wound care nurse evaluated her wounds.   Today initial BP ok , but has had persistent hypotension since then, with maps in low 60s, no improvement with fluids.  Baseline maps in 70s at least.  + 2.5 L in ED  In ED received lokelma, cefepime, vanc, started on levophed at around 2am. PCCM consulted for admission given hypotension despite fluid resuscitation and concern for septic shock.   Pertinent  Medical History  Multiple sclernosis Ankylosing spondylitis GERD Seizure disorder Stage 2 sacral ulcer/chronic venous statis, Superficial LE ulcers.  COPD   Significant Hospital Events: Including procedures, antibiotic start and stop dates in addition to other pertinent events   5/17 admitted, transferred to ICU for pressor support  Interim History / Subjective:  This morning doing well, pain is tolerable. Eating some, trying to improve po intake.   Objective   Blood pressure (!) 104/54, pulse 85, temperature (!) 97.4 F (36.3 C), temperature source  Oral, resp. rate 15, weight 58.2 kg, SpO2 98 %.        Intake/Output Summary (Last 24 hours) at 05/26/2021 0939 Last data filed at 05/26/2021 0700 Gross per 24 hour  Intake 3525.23 ml  Output 600 ml  Net 2925.23 ml   Filed Weights   05/26/21 0432  Weight: 58.2 kg   Examination: General: Resting in bed comfortably in no acute distress HENT: Normocephalic, atraumatic. Moist mucous membranes. Lungs: Normal respiratory effort on room air. Clear to ausculation bilaterally. Cardiovascular: Regular rate, rhythm. No murmurs. Distal pulses 2+ bilaterally. Abdomen: Soft, non-tender, non-distended. Normoactive bowel sounds. Extremities: Bilateral feet minimally erythematous. Lower legs wrapped with gauze. No satellite lesions or crepitus appreciated. Neuro: Awake, alert, conversing appropriately. Grossly non-focal.  Resolved Hospital Problems:  Acute non-oliguric renal failure Anion gap metabolic acidosis Hypovolemic hyponatremia  Assessment & Plan:  #Septic shock - cellulitis, urinary tract infection Ms. Warhurst still requiring low-dose peripheral vasopressors along with IV fluids despite addition of midodrine yesterday. Renal function continues to be at baseline and she is having good oral intake. Given recent long hospitalization along with septic shock w/ ongoing pressor requirements will start steroids today.  - Start IV hydrocortisone 100mg  q12h - Peripheral levophed w/ goal MAP >65 - Continue Unasyn, vancomycin - Continue midodrine 10mg  three times daily - WOC, appreciate assistance - Oxycodone 5mg  every 4 hours as needed  #Hypomagnesemia K improved, Mg still 1.5, will replete. - Goal K>4, Mg >2  #Normocytic anemia Hgb stable. No signs or symptoms of active bleed. Does have elevated RDW, would consider checking iron once acute illness has resolved.  #Hypoalbuminemia Low albumin likely  in setting of recent, long hospital course. Will have our dieticians see Ms. Mcshan and make  recommendations.   #Seizure disorder - Continue home Keppra  #Multiple sclerosis Started on Ocrelizumab in February, will need to follow-up with outpatient neurology.  Best Practice (right click and "Reselect all SmartList Selections" daily)   Diet/type: DYS3 DVT prophylaxis: prophylactic heparin  GI prophylaxis: PPI Lines: N/A Foley:  N/A Code Status:  full code Last date of multidisciplinary goals of care discussion []   Labs   CBC: Recent Labs  Lab 05/25/21 1608  WBC 13.9*  NEUTROABS 12.2*  HGB 12.0  HCT 36.4  MCV 87.7  PLT 461*    Basic Metabolic Panel: Recent Labs  Lab 05/25/21 1803  NA 128*  K 5.3*  CL 93*  CO2 13*  GLUCOSE 76  BUN 125*  CREATININE 2.70*  CALCIUM 7.4*   GFR: Estimated Creatinine Clearance: 18.4 mL/min (A) (by C-G formula based on SCr of 2.7 mg/dL (H)). Recent Labs  Lab 05/25/21 1608 05/25/21 1620  WBC 13.9*  --   LATICACIDVEN 1.2 1.1    Liver Function Tests: Recent Labs  Lab 05/25/21 1803  AST 12*  ALT 13  ALKPHOS 80  BILITOT 0.8  PROT 5.1*  ALBUMIN 2.6*   No results for input(s): LIPASE, AMYLASE in the last 168 hours. No results for input(s): AMMONIA in the last 168 hours.  ABG    Component Value Date/Time   PHART 7.419 04/28/2021 0257   PCO2ART 35.2 04/28/2021 0257   PO2ART 73 (L) 04/28/2021 0257   HCO3 22.8 04/28/2021 0257   TCO2 24 04/28/2021 0257   ACIDBASEDEF 1.0 04/28/2021 0257   O2SAT 95 04/28/2021 0257     Coagulation Profile: No results for input(s): INR, PROTIME in the last 168 hours.  Cardiac Enzymes: No results for input(s): CKTOTAL, CKMB, CKMBINDEX, TROPONINI in the last 168 hours.  HbA1C: Hgb A1c MFr Bld  Date/Time Value Ref Range Status  04/26/2021 01:07 PM 5.0 4.8 - 5.6 % Final    Comment:    (NOTE) Pre diabetes:          5.7%-6.4%  Diabetes:              >6.4%  Glycemic control for   <7.0% adults with diabetes    CBG: No results for input(s): GLUCAP in the last 168  hours.  Critical care time: 79 min    Sanjuan Dame, MD Internal Medicine PGY-2 Pager: [phone]

## 2021-05-28 NOTE — TOC Initial Note (Signed)
Transition of Care [facility]) - Initial/Assessment Note    Patient Details  Name: [name] MRN: [id number] Date of Birth: 1960-04-11  Transition of [facility]) CM/SW Contact:    Angelita Ingles, RN Phone [phone]  05/28/2021, 2:40 PM  Clinical Narrative:                 TOC following for patient with recent readmission and high risk for readmission. Per Husband he has attempted to encourage patient to receive a little rehab at a facility but patient states that she would be happier at home. Patient is from home with husband / daughter and son  participate in providing 24/7 care. Patient received Home health services through [facility]. MD will need to enter [facility] PT/OT/RN orders to continue services upon discharge. Cory with [facility] has been made aware that patient will resume services after discharge. Patient currently has wheelchair and previously refused hospital bed.No other DME needs noted at this time. TOC will continue to follow for disposition needs.    Expected Discharge Plan: [place] Barriers to Discharge: Continued Medical Work up   Patient Goals and CMS Choice Patient states their goals for this hospitalization and ongoing recovery are:: To be able to go home CMS Medicare.gov Compare Post Acute Care list provided to::  (n/a) Choice offered to / list presented to : Spouse, Patient  Expected Discharge Plan and Services Expected Discharge Plan: [place]   Discharge Planning Services: CM Consult Post Acute Care Choice: [place] arrangements for the past 2 months: Single Family Home                           HH Arranged: PT, OT, RN (currently active) [place] Agency: [place] Date [facility] Agency Contacted: 05/28/21 Time [place]: [id number] Representative spoke with at [place]: Tommi Rumps has been updated and made aware that family and patient wishes to resume services at d/c  Prior Living  Arrangements/Services Living arrangements for the past 2 months: Single Family Home Lives with:: Spouse Patient language and need for interpreter reviewed:: Yes Do you feel safe going back to the place where you live?: Yes      Need for Family Participation in Patient Care: Yes (Comment) Care giver support system in place?: Yes (comment)      Activities of Daily Living      Permission Sought/Granted Permission sought to share information with : Family Supports    Share Information with NAME: Ron     Permission granted to share info w Relationship: husband  Permission granted to share info w Contact Information: [phone]  Emotional Assessment Appearance:: Appears stated age   Affect (typically observed): Accepting Orientation: : Oriented to Self, Oriented to Place, Oriented to  Time, Oriented to Situation Alcohol / Substance Use: Not Applicable Psych Involvement: No (comment)  Admission diagnosis:  Hyperkalemia [E87.5] Uremia [N19] AKI (acute kidney injury) ([place]) [N17.9] Increased anion gap metabolic acidosis [A21.30] Septic shock (HCC) [A41.9, R65.21] Sepsis ([place]) [A41.9] Cellulitis of lower extremity, unspecified laterality [Q65.784] Hypotension, unspecified hypotension type [I95.9] Urinary tract infection with hematuria, site unspecified [N39.0, R31.9] Patient Active Problem List   Diagnosis Date Noted   Protein-calorie malnutrition, severe 05/27/2021   Sepsis ([place]) 05/26/2021   Dysphagia 05/14/2021   Physical deconditioning 05/14/2021   Acute respiratory failure with hypoxia (HCC)    Ulcer, pressure 05/08/2021   HLA B27 (HLA B27 positive) 04/26/2021  Cellulitis of lower extremity 04/26/2021   High anion gap metabolic acidosis 03/24/9456   Hyperkalemia 59/29/2446   Acute metabolic encephalopathy 28/63/8177   Bilateral pneumothorax 04/26/2021   Normocytic anemia    GERD (gastroesophageal reflux disease)    Pulmonary nodule    MS (multiple sclerosis) (HCC)     AKI (acute kidney injury) ([place])    Septic shock due to RLE cellulitis in immunocompromised patient    COPD  GOLD ? / still smoking  11/02/2020   Multiple pulmonary nodules determined by computed tomography of lung 11/02/2020   Seizure disorder ([place]) 09/27/2012   PCP:  [facility] [place]:   [id number] [place] [id number]: [phone]: [phone]    Social Determinants of Health (SDOH) Interventions    Readmission Risk Interventions    05/28/2021    2:26 PM  Readmission Risk Prevention Plan  Transportation Screening Complete  PCP or Specialist Appt within 3-5 Days Complete  [place] or [place]  Social Work Consult for [place]Counseling Complete  Palliative Care Screening Not Applicable  Medication Review (Press photographer Referral to Pharmacy

## 2021-05-29 DIAGNOSIS — N179 Acute kidney failure, unspecified: Secondary | ICD-10-CM | POA: Diagnosis not present

## 2021-05-29 DIAGNOSIS — A419 Sepsis, unspecified organism: Secondary | ICD-10-CM | POA: Diagnosis not present

## 2021-05-29 DIAGNOSIS — R6520 Severe sepsis without septic shock: Secondary | ICD-10-CM | POA: Diagnosis not present

## 2021-05-29 LAB — BASIC METABOLIC PANEL
Anion gap: 7 (ref 5–15)
Anion gap: 7 (ref 5–15)
Anion gap: 7 (ref 5–15)
BUN: 24 mg/dL — ABNORMAL HIGH (ref 8–23)
BUN: 25 mg/dL — ABNORMAL HIGH (ref 8–23)
BUN: 25 mg/dL — ABNORMAL HIGH (ref 8–23)
CO2: 21 mmol/L — ABNORMAL LOW (ref 22–32)
CO2: 21 mmol/L — ABNORMAL LOW (ref 22–32)
CO2: 23 mmol/L (ref 22–32)
Calcium: 8.6 mg/dL — ABNORMAL LOW (ref 8.9–10.3)
Calcium: 8.4 mg/dL — ABNORMAL LOW (ref 8.9–10.3)
Calcium: 8.1 mg/dL — ABNORMAL LOW (ref 8.9–10.3)
Chloride: 115 mmol/L — ABNORMAL HIGH (ref 98–111)
Chloride: 115 mmol/L — ABNORMAL HIGH (ref 98–111)
Chloride: 112 mmol/L — ABNORMAL HIGH (ref 98–111)
Creatinine, Ser: 0.59 mg/dL (ref 0.44–1.00)
Creatinine, Ser: 0.56 mg/dL (ref 0.44–1.00)
Creatinine, Ser: 0.45 mg/dL (ref 0.44–1.00)
GFR, Estimated: >60 mL/min (ref >60)
GFR, Estimated: >60 mL/min (ref >60)
GFR, Estimated: >60 mL/min (ref >60)
Glucose, Bld: 178 mg/dL — ABNORMAL HIGH (ref 70–99)
Glucose, Bld: 166 mg/dL — ABNORMAL HIGH (ref 70–99)
Glucose, Bld: 118 mg/dL — ABNORMAL HIGH (ref 70–99)
Potassium: 4.2 mmol/L (ref 3.5–5.1)
Potassium: 3.5 mmol/L (ref 3.5–5.1)
Potassium: 3.4 mmol/L — ABNORMAL LOW (ref 3.5–5.1)
Sodium: 143 mmol/L (ref 135–145)
Sodium: 143 mmol/L (ref 135–145)
Sodium: 142 mmol/L (ref 135–145)

## 2021-05-29 LAB — CBC
HCT: 29.9 % — ABNORMAL LOW (ref 36.0–46.0)
Hemoglobin: 9.5 g/dL — ABNORMAL LOW (ref 12.0–15.0)
MCH: 28.5 pg (ref 26.0–34.0)
MCHC: 31.8 g/dL (ref 30.0–36.0)
MCV: 89.8 fL (ref 80.0–100.0)
Platelets: 313 10*3/uL (ref 150–400)
RBC: 3.33 MIL/uL — ABNORMAL LOW (ref 3.87–5.11)
RDW: 16.5 % — ABNORMAL HIGH (ref 11.5–15.5)
WBC: 7.7 10*3/uL (ref 4.0–10.5)
nRBC: 0 % (ref 0.0–0.2)

## 2021-05-29 LAB — GLUCOSE, CAPILLARY
Glucose-Capillary: 102 mg/dL — ABNORMAL HIGH (ref 70–99)
Glucose-Capillary: 134 mg/dL — ABNORMAL HIGH (ref 70–99)
Glucose-Capillary: 140 mg/dL — ABNORMAL HIGH (ref 70–99)
Glucose-Capillary: 160 mg/dL — ABNORMAL HIGH (ref 70–99)
Glucose-Capillary: 199 mg/dL — ABNORMAL HIGH (ref 70–99)

## 2021-05-29 LAB — PHOSPHORUS
Phosphorus: <1 mg/dL — CL (ref 2.5–4.6)
Phosphorus: 2.3 mg/dL — ABNORMAL LOW (ref 2.5–4.6)

## 2021-05-29 LAB — MAGNESIUM
Magnesium: 1.7 mg/dL (ref 1.7–2.4)
Magnesium: 1.9 mg/dL (ref 1.7–2.4)

## 2021-05-29 MED ORDER — INSULIN ASPART 100 UNIT/ML IJ SOLN
0.0000 [IU] | INTRAMUSCULAR | Status: DC
  Administered 2021-05-29: 2 [IU] via SUBCUTANEOUS
  Administered 2021-05-29: 1 [IU] via SUBCUTANEOUS
  Administered 2021-05-29: 2 [IU] via SUBCUTANEOUS
  Administered 2021-05-29 – 2021-06-03 (×4): 1 [IU] via SUBCUTANEOUS

## 2021-05-29 MED ORDER — POTASSIUM CHLORIDE CRYS ER 20 MEQ PO TBCR
40.0000 meq | EXTENDED_RELEASE_TABLET | Freq: Once | ORAL | Status: AC
  Administered 2021-05-29: 40 meq via ORAL
  Filled 2021-05-29: qty 2

## 2021-05-29 MED ORDER — POTASSIUM PHOSPHATES 15 MMOLE/5ML IV SOLN
20.0000 mmol | Freq: Once | INTRAVENOUS | Status: AC
  Administered 2021-05-29: 20 mmol via INTRAVENOUS
  Filled 2021-05-29: qty 6.67

## 2021-05-29 MED ORDER — SODIUM PHOSPHATES 45 MMOLE/15ML IV SOLN
30.0000 mmol | Freq: Once | INTRAVENOUS | Status: AC
  Administered 2021-05-29: 30 mmol via INTRAVENOUS
  Filled 2021-05-29: qty 10

## 2021-05-29 MED ORDER — MAGNESIUM SULFATE 2 GM/50ML IV SOLN
2.0000 g | Freq: Once | INTRAVENOUS | Status: AC
  Administered 2021-05-29: 2 g via INTRAVENOUS
  Filled 2021-05-29: qty 50

## 2021-05-29 NOTE — Progress Notes (Signed)
eLink Physician-Brief Progress Note Patient Name: [name] DOB: 1960/05/20 MRN: [id number]   Date of Service  05/29/2021  HPI/Events of Note  Phosphorus level < 1.0.  eICU Interventions  Plan: Replace phosphorus. Repeat phosphorus level at 5 PM.     Intervention Category Major Interventions: Electrolyte abnormality - evaluation and management  Shandell Giovanni Dennard Nip 05/29/2021, 6:55 AM

## 2021-05-29 NOTE — Progress Notes (Signed)
eLink Physician-Brief Progress Note Patient Name: [name] DOB: Oct 18, 1960 MRN: [id number]   Date of Service  05/29/2021  HPI/Events of Note  Hyperglycemia - Blood glucose = 199.  eICU Interventions  Plan: Q 4 hour sensitive Novolog SSI.     Intervention Category Major Interventions: Hyperglycemia - active titration of insulin therapy  Elanah Osmanovic Eugene 05/29/2021, 3:52 AM

## 2021-05-29 NOTE — Progress Notes (Signed)
NAME:  [name], MRN:  [id number], DOB:  [birth year], LOS: 0 ADMISSION DATE:  05/25/2021, CONSULTATION DATE:  05/25/21 REFERRING MD:  [name], ED, CHIEF COMPLAINT:  LE wounds    History of Present Illness:  61 yo woman with multiple sclerosis (ambulatory with walker), anklyosing spondylitis, GERD, seizure disorder, fibromyalgia, recent admission 4/17-5/9 for septic shock due to cellulitis of the RLE c/b aspiration pneumonia and bilateral pneumothoraces s/p chest tube placements presents here after being sent in for worsening LE wounds by her wound nurse.  She started being treated w/ Ocrelizumab back in Feb. Per pt and family about 4 weeks after began to notice increased RLE swelling, scaling and weakness. She was then hospitalized for septic shock with multiple complications. She was discharged home from the hospital with home health.  Since then she's had worsening lower extremity swelling, pain, and redness. Today, she arrives to ED after wound care nurse evaluated her wounds.   Today initial BP ok , but has had persistent hypotension since then, with maps in low 60s, no improvement with fluids.  Baseline maps in 70s at least.  + 2.5 L in ED  In ED received lokelma, cefepime, vanc, started on levophed at around 2am. PCCM consulted for admission given hypotension despite fluid resuscitation and concern for septic shock.   Pertinent  Medical History  Multiple sclernosis Ankylosing spondylitis GERD Seizure disorder Stage 2 sacral ulcer/chronic venous statis, Superficial LE ulcers.  COPD   Significant Hospital Events: Including procedures, antibiotic start and stop dates in addition to other pertinent events   5/17 admitted, transferred to ICU for pressor support 5/20 off of vasopressors  Interim History / Subjective:  Yesterday patient had increased appetite, was able to eat some mac n cheese. Pain is okay today. No acute complaints.   Objective   Blood pressure (!) 104/54, pulse 85,  temperature (!) 97.4 F (36.3 C), temperature source Oral, resp. rate 15, weight 58.2 kg, SpO2 98 %.        Intake/Output Summary (Last 24 hours) at 05/26/2021 0939 Last data filed at 05/26/2021 0700 Gross per 24 hour  Intake 3525.23 ml  Output 600 ml  Net 2925.23 ml   Filed Weights   05/26/21 0432  Weight: 58.2 kg   Examination: General: Resting in bed comfortably in no acute distress HENT: Normocephalic, atraumatic. Moist mucous membranes. Lungs: Normal respiratory effort on room air. Clear to ausculation bilaterally. Cardiovascular: Regular rate, rhythm. No murmurs. Distal pulses 2+ bilaterally. Abdomen: Soft, non-tender, non-distended. Normoactive bowel sounds. Extremities: Bilateral feet minimally erythematous. Lower legs wrapped with gauze. No satellite lesions or crepitus appreciated. Neuro: Awake, alert, conversing appropriately. Grossly non-focal.  Resolved Hospital Problems:  Acute non-oliguric renal failure Anion gap metabolic acidosis Hypovolemic hyponatremia  Assessment & Plan:  #Septic shock - cellulitis, urinary tract infection This morning MAP's appropriate without use of vasopressors. Will continue with daily midodrine, stop further steroids. If she does become hypotensive again, might need prolonged course of steroids. She is appropriate to be transferred out of unit.  - Continue Unasyn, linzeolid - Continue midodrine 70m three times daily - Hold further steroids - WOC, appreciate assistance - Oxycodone 537mevery 4 hours as needed  #Severe protein-calorie malnutrition  #Refeeding syndrome #Hypophosphatemia #Hypomagnesemia Phos this morning markedly low with level <0.1. Likely has refeeding syndrome, as we started tube feeds yesterday and she had increased po intake. K ok, mag mildly low. Will get twice daily electrolytes and replete as needed.   #Normocytic anemia Hgb  stable. No signs or symptoms of active bleed. Does have elevated RDW, would consider  checking iron once acute illness has resolved.  #Seizure disorder - Continue home Keppra  #Multiple sclerosis Started on Ocrelizumab in February, will need to follow-up with outpatient neurology.  Best Practice (right click and "Reselect all SmartList Selections" daily)   Diet/type: DYS3 DVT prophylaxis: prophylactic heparin  GI prophylaxis: PPI Lines: N/A Foley:  N/A Code Status:  full code Last date of multidisciplinary goals of care discussion _0   Labs   CBC: Recent Labs  Lab 05/25/21 1608  WBC 13.9*  NEUTROABS 12.2*  HGB 12.0  HCT 36.4  MCV 87.7  PLT 461*    Basic Metabolic Panel: Recent Labs  Lab 05/25/21 1803  NA 128*  K 5.3*  CL 93*  CO2 13*  GLUCOSE 76  BUN 125*  CREATININE 2.70*  CALCIUM 7.4*   GFR: Estimated Creatinine Clearance: 18.4 mL/min (A) (by C-G formula based on SCr of 2.7 mg/dL (H)). Recent Labs  Lab 05/25/21 1608 05/25/21 1620  WBC 13.9*  --   LATICACIDVEN 1.2 1.1    Liver Function Tests: Recent Labs  Lab 05/25/21 1803  AST 12*  ALT 13  ALKPHOS 80  BILITOT 0.8  PROT 5.1*  ALBUMIN 2.6*   No results for input(s): LIPASE, AMYLASE in the last 168 hours. No results for input(s): AMMONIA in the last 168 hours.  ABG    Component Value Date/Time   PHART 7.419 04/28/2021 0257   PCO2ART 35.2 04/28/2021 0257   PO2ART 73 (L) 04/28/2021 0257   HCO3 22.8 04/28/2021 0257   TCO2 24 04/28/2021 0257   ACIDBASEDEF 1.0 04/28/2021 0257   O2SAT 95 04/28/2021 0257     Coagulation Profile: No results for input(s): INR, PROTIME in the last 168 hours.  Cardiac Enzymes: No results for input(s): CKTOTAL, CKMB, CKMBINDEX, TROPONINI in the last 168 hours.  HbA1C: Hgb A1c MFr Bld  Date/Time Value Ref Range Status  04/26/2021 01:07 PM 5.0 4.8 - 5.6 % Final    Comment:    (NOTE) Pre diabetes:          5.7%-6.4%  Diabetes:              >6.4%  Glycemic control for   <7.0% adults with diabetes    CBG: No results for input(s): GLUCAP  in the last 168 hours.  Critical care time: 25 min    Nancy Dame, MD Internal Medicine PGY-2 Pager: [phone]

## 2021-05-30 DIAGNOSIS — N179 Acute kidney failure, unspecified: Secondary | ICD-10-CM | POA: Diagnosis not present

## 2021-05-30 DIAGNOSIS — A419 Sepsis, unspecified organism: Secondary | ICD-10-CM | POA: Diagnosis not present

## 2021-05-30 DIAGNOSIS — R6520 Severe sepsis without septic shock: Secondary | ICD-10-CM | POA: Diagnosis not present

## 2021-05-30 LAB — BASIC METABOLIC PANEL
Anion gap: 7 (ref 5–15)
Anion gap: 6 (ref 5–15)
BUN: 19 mg/dL (ref 8–23)
BUN: 21 mg/dL (ref 8–23)
CO2: 23 mmol/L (ref 22–32)
CO2: 24 mmol/L (ref 22–32)
Calcium: 7.9 mg/dL — ABNORMAL LOW (ref 8.9–10.3)
Calcium: 7.9 mg/dL — ABNORMAL LOW (ref 8.9–10.3)
Chloride: 113 mmol/L — ABNORMAL HIGH (ref 98–111)
Chloride: 110 mmol/L (ref 98–111)
Creatinine, Ser: 0.43 mg/dL — ABNORMAL LOW (ref 0.44–1.00)
Creatinine, Ser: 0.47 mg/dL (ref 0.44–1.00)
GFR, Estimated: >60 mL/min (ref >60)
GFR, Estimated: >60 mL/min (ref >60)
Glucose, Bld: 90 mg/dL (ref 70–99)
Glucose, Bld: 114 mg/dL — ABNORMAL HIGH (ref 70–99)
Potassium: 4.6 mmol/L (ref 3.5–5.1)
Potassium: 4.6 mmol/L (ref 3.5–5.1)
Sodium: 143 mmol/L (ref 135–145)
Sodium: 140 mmol/L (ref 135–145)

## 2021-05-30 LAB — GLUCOSE, CAPILLARY
Glucose-Capillary: 114 mg/dL — ABNORMAL HIGH (ref 70–99)
Glucose-Capillary: 114 mg/dL — ABNORMAL HIGH (ref 70–99)
Glucose-Capillary: 119 mg/dL — ABNORMAL HIGH (ref 70–99)
Glucose-Capillary: 93 mg/dL (ref 70–99)
Glucose-Capillary: 98 mg/dL (ref 70–99)
Glucose-Capillary: 98 mg/dL (ref 70–99)

## 2021-05-30 LAB — CULTURE, BLOOD (ROUTINE X 2)
Culture: NO GROWTH
Culture: NO GROWTH
Special Requests: ADEQUATE
Special Requests: ADEQUATE

## 2021-05-30 LAB — CBC
HCT: 28.7 % — ABNORMAL LOW (ref 36.0–46.0)
Hemoglobin: 8.9 g/dL — ABNORMAL LOW (ref 12.0–15.0)
MCH: 27.9 pg (ref 26.0–34.0)
MCHC: 31 g/dL (ref 30.0–36.0)
MCV: 90 fL (ref 80.0–100.0)
Platelets: 268 10*3/uL (ref 150–400)
RBC: 3.19 MIL/uL — ABNORMAL LOW (ref 3.87–5.11)
RDW: 16.3 % — ABNORMAL HIGH (ref 11.5–15.5)
WBC: 8 10*3/uL (ref 4.0–10.5)
nRBC: 0 % (ref 0.0–0.2)

## 2021-05-30 LAB — MAGNESIUM
Magnesium: 1.9 mg/dL (ref 1.7–2.4)
Magnesium: 1.6 mg/dL — ABNORMAL LOW (ref 1.7–2.4)

## 2021-05-30 LAB — PHOSPHORUS
Phosphorus: 2.5 mg/dL (ref 2.5–4.6)
Phosphorus: 2.2 mg/dL — ABNORMAL LOW (ref 2.5–4.6)

## 2021-05-30 MED ORDER — SODIUM PHOSPHATES 45 MMOLE/15ML IV SOLN
20.0000 mmol | Freq: Once | INTRAVENOUS | Status: AC
  Administered 2021-05-30: 20 mmol via INTRAVENOUS
  Filled 2021-05-30: qty 6.67

## 2021-05-30 MED ORDER — MAGNESIUM SULFATE 4 GM/100ML IV SOLN
4.0000 g | Freq: Once | INTRAVENOUS | Status: AC
  Administered 2021-05-30: 4 g via INTRAVENOUS
  Filled 2021-05-30: qty 100

## 2021-05-30 NOTE — Progress Notes (Signed)
PROGRESS NOTE  DAMON RENNISON  DOB: 1960-09-17  PCP: [facility] [id number]  DOA: 05/25/2021  LOS: 4 days  Hospital Day: 6  Brief narrative: ALSACE QUIRINDONGO is a 61 y.o. female with PMH significant for multiple sclerosis, ankylosing spondylitis (walks with a walker), fibromyalgia, seizure disorder, GERD.   Patient was started on ocrelizumab back in Feb after about 4 weeks of treatment, patient started having increased swelling, scaling and redness of right lower extremity.  She was subsequently admitted 4/17-5/9 for right lower extremity cellulitis, aspiration pneumonia, bilateral pneumothoraces s/p chest tube placements and ultimately discharged home with home health.   5/16, patient was sent to the ED by his wound nurse for continuous worsening of lower extremity wound.   In the ED, her blood pressure dipped down to 60s requiring fluid boluses and pressors. She was admitted to ICU and remained on pressors till 5/20 5/21, transferred out to [facility] service   Subjective: Patient was seen and examined this morning.  Pleasant middle-aged Caucasian female.  Looks older for her age.  Not in distress.  Has core track feeding in place Chart reviewed Last 24 hours, heart rate in 90s and 100s, blood pressure in 120s, breathing on room air Last set of labs from this morning with BMP mostly unremarkable, CBC with WC count normal at 8, hemoglobin low at 8.9, platelets normal at 268.  Principal Problem:   Sepsis ([place]) Active Problems:   Cellulitis of lower extremity   Ulcer, pressure   Protein-calorie malnutrition, severe    Assessment and Plan: Septic shock secondary to lower extremity cellulitis -Per history, her cellulitis started to worsen after she was started on immunosuppressants in February. -In April she had prolonged hospitalization with septic shock due to those wounds.  Similar presentation and severity this time again. -Septic shock resolved.  Currently off  pressors. -No clear growth on blood culture -WBC count and lactic acid level normalized. -Currently Unasyn, linezolid -Midodrine 10 mg 3 times daily for blood pressure support -Wound care nurse following. -Oxycodone as needed for pain Recent Labs  Lab 05/25/21 1608 05/25/21 1620 05/26/21 0330 05/26/21 0624 05/26/21 0806 05/27/21 0726 05/28/21 0218 05/29/21 0608 05/30/21 0646  WBC 13.9*  --  7.0  --  8.9 5.6 6.4 7.7 8.0  LATICACIDVEN 1.2 1.1 8.9* 1.9 1.3  --   --   --   --   PROCALCITON  --   --  <0.10  --   --   --   --   --   --    Hypokalemia/hypomagnesemia/hypophosphatemia Severe protein calorie malnutrition Refeeding syndrome -All electrolyte levels were low because of poor oral intake.  After tube feeding was initiated, she went to refeeding syndrome causing electrolytes to be further low.  Adequate replacement given.  Levels normalized.  Continue to monitor. -Currently on dysphagia 3 diet as well as tube feeding through core track tube. Recent Labs  Lab 05/27/21 0726 05/27/21 1406 05/28/21 0218 05/28/21 1510 05/28/21 1906 05/29/21 0608 05/29/21 1019 05/29/21 1736 05/30/21 0646  K 2.4*   < > 4.3  --   --  4.2 3.5 3.4* 4.6  MG 1.5*  --  1.5*  --   --  1.7  --  1.9 1.9  PHOS  --   --   --  1.4* 1.5* <1.0*  --  2.3* 2.5   < > = values in this interval not displayed.   Normocytic anemia GERD -Hemoglobin level chronically less than 8.  Remains  stable.  Continue to monitor  -Continue PPI Recent Labs    05/26/21 0806 05/27/21 0726 05/28/21 0218 05/29/21 0608 05/30/21 0646  HGB 9.8* 9.2* 9.4* 9.5* 8.9*  MCV 85.0 85.9 87.7 89.8 90.0   Seizure disorder - Continue home Keppra   Multiple sclerosis Impaired mobility -Started on Ocrelizumab in February, will need to follow-up with outpatient neurology. -Ambulates with walker at baseline -Seen by PT on 5/19.  Home with PT recommended. -PTA, on Neurontin 300 mg twice daily.  Continue same  Anxiety disorder -On  hydroxyzine 25 mg twice daily as needed  Goals of care   Code Status: Full Code    Mobility: Seen by PT.  Home health PT recommended  Skin assessment:  Pressure Injury 05/26/21 Sacrum Mid;Lower Stage 3 -  Full thickness tissue loss. Subcutaneous fat may be visible but bone, tendon or muscle are NOT exposed. (Active)  05/26/21 0430  Location: Sacrum  Location Orientation: Mid;Lower  Staging: Stage 3 -  Full thickness tissue loss. Subcutaneous fat may be visible but bone, tendon or muscle are NOT exposed.  Wound Description (Comments):   Present on Admission: Yes     Pressure Injury 05/26/21 Left Stage 3 -  Full thickness tissue loss. Subcutaneous fat may be visible but bone, tendon or muscle are NOT exposed. Left Inner Gluteal Fold (Active)  05/26/21 1728  Location:   Location Orientation: Left  Staging: Stage 3 -  Full thickness tissue loss. Subcutaneous fat may be visible but bone, tendon or muscle are NOT exposed.  Wound Description (Comments): Left Inner Gluteal Fold  Present on Admission: Yes    Nutritional status:  Body mass index is 23.12 kg/m.  Nutrition Problem: Severe Malnutrition Etiology: acute illness (sepsis, cellulitis causing poor appetite/intake) Signs/Symptoms: energy intake < or equal to 50% for > or equal to 5 days, severe fat depletion     Diet:  Diet Order             DIET DYS 3 Room service appropriate? Yes with Assist; Fluid consistency: Thin  Diet effective now                   DVT prophylaxis:  heparin injection 5,000 Units Start: 05/26/21 0600   Antimicrobials: Currently on linezolid and Unasyn Fluid: None Consultants: PCCM Family Communication: None at bedside  Status is: Inpatient  Continue in-hospital care because: Remains on IV antibiotics Level of care: Telemetry Medical   Dispo: The patient is from: Home              Anticipated d/c is to: Hopefully home with home health PT in the next 2 to 3 days              Patient  currently is not medically stable to d/c.   Difficult to place patient No     Infusions:   sodium chloride 10 mL/hr at 05/30/21 0000   ampicillin-sulbactam (UNASYN) IV 3 g (05/30/21 1030)   feeding supplement (OSMOLITE 1.5 CAL) 45 mL/hr at 05/30/21 0500   linezolid (ZYVOX) IV Stopped (05/29/21 2337)    Scheduled Meds:  Chlorhexidine Gluconate Cloth  6 each Topical Daily   feeding supplement (PROSource TF)  45 mL Per Tube TID   gabapentin  300 mg Oral BID   heparin  5,000 Units Subcutaneous Q8H   insulin aspart  0-9 Units Subcutaneous Q4H   levETIRAcetam  500 mg Oral BID   mouth rinse  15 mL Mouth Rinse BID   midodrine  10 mg Oral Q8H   nutrition supplement (JUVEN)  1 packet Per Tube BID BM   nystatin cream   Topical BID   pantoprazole  40 mg Oral QHS    PRN meds: sodium chloride, docusate sodium, hydrOXYzine, ondansetron (ZOFRAN) IV, oxyCODONE, polyethylene glycol, white petrolatum   Antimicrobials: Anti-infectives (From admission, onward)    Start     Dose/Rate Route Frequency Ordered Stop   05/29/21 2200  vancomycin (VANCOREADY) IVPB 1250 mg/250 mL  Status:  Discontinued        1,250 mg 166.7 mL/hr over 90 Minutes Intravenous Every 24 hours 05/28/21 1033 05/28/21 1132   05/28/21 2200  linezolid (ZYVOX) IVPB 600 mg        600 mg 300 mL/hr over 60 Minutes Intravenous Every 12 hours 05/28/21 1132     05/27/21 1030  Ampicillin-Sulbactam (UNASYN) 3 g in sodium chloride 0.9 % 100 mL IVPB        3 g 200 mL/hr over 30 Minutes Intravenous Every 8 hours 05/27/21 0931     05/27/21 1030  vancomycin (VANCOCIN) IVPB 1000 mg/200 mL premix  Status:  Discontinued        1,000 mg 200 mL/hr over 60 Minutes Intravenous Every 12 hours 05/27/21 0931 05/28/21 1033   05/26/21 1600  ceFEPIme (MAXIPIME) 2 g in sodium chloride 0.9 % 100 mL IVPB  Status:  Discontinued        2 g 200 mL/hr over 30 Minutes Intravenous Every 12 hours 05/26/21 0425 05/27/21 0928   05/26/21 0431  vancomycin  variable dose per unstable renal function (pharmacist dosing)  Status:  Discontinued         Does not apply See admin instructions 05/26/21 0431 05/26/21 0912   05/26/21 0430  ceFEPIme (MAXIPIME) 2 g in sodium chloride 0.9 % 100 mL IVPB        2 g 200 mL/hr over 30 Minutes Intravenous  Once 05/26/21 0425 05/26/21 0626   05/25/21 1600  vancomycin (VANCOCIN) IVPB 1000 mg/200 mL premix        1,000 mg 200 mL/hr over 60 Minutes Intravenous  Once 05/25/21 1552 05/25/21 1747   05/25/21 1600  ceFEPIme (MAXIPIME) 2 g in sodium chloride 0.9 % 100 mL IVPB        2 g 200 mL/hr over 30 Minutes Intravenous  Once 05/25/21 1552 05/25/21 1731       Objective: Vitals:   05/30/21 0644 05/30/21 0902  BP: 122/65 124/73  Pulse: 94 98  Resp: 18 19  Temp: 98 F (36.7 C) 98.3 F (36.8 C)  SpO2: 94% 96%    Intake/Output Summary (Last 24 hours) at 05/30/2021 1106 Last data filed at 05/30/2021 0500 Gross per 24 hour  Intake 2922.3 ml  Output 1000 ml  Net 1922.3 ml   Filed Weights   05/26/21 0432 05/27/21 0500  Weight: 58.2 kg 61.1 kg   Weight change:  Body mass index is 23.12 kg/m.   Physical Exam: General exam: Pleasant, middle-aged Caucasian female.  Looks older for age.  Wants to be comfortable in bed Skin: No rashes, lesions or ulcers. HEENT: Atraumatic, normocephalic, no obvious bleeding.  Has a core track tube for feeding Lungs: Clear to auscultation bilaterally CVS: Regular rate and rhythm, no murmur GI/Abd soft, nontender, nondistended, bowel sound present CNS: Alert, awake, oriented x3 Psychiatry: Sad affect Extremities: Bandages bilateral lower extremities.  Skin wrinklings in both legs noted  Data Review: I have personally reviewed the laboratory data and studies available.  F/u labs ordered Unresulted Labs (From admission, onward)     Start     Ordered   05/30/21 1700  Phosphorus  5A & 5P,   R     Question:  Specimen collection method  Answer:  Lab=Lab collect   05/30/21  0720   05/29/21 1700  Magnesium  5A & 5P,   R     Question:  Specimen collection method  Answer:  Lab=Lab collect   05/29/21 0835   05/29/21 [id number]  Basic metabolic panel  5A & 5P,   R     Question:  Specimen collection method  Answer:  Lab=Lab collect   05/29/21 0850   Unscheduled  CBC with Differential/Platelet  Daily,   R     Question:  Specimen collection method  Answer:  Lab=Lab collect   05/30/21 1106            Signed, Terrilee Croak, MD [facility] 05/30/2021

## 2021-05-30 NOTE — Progress Notes (Signed)
MEDICATION RELATED CONSULT NOTE - INITIAL   Pharmacy Consult for electrolyte replacement Indication: pt at risk of refeeding  Allergies  Allergen Reactions   Lamictal [Lamotrigine] Rash   Other     Cats: Rash  Shrimp scampi sauce: rash   Tramadol     Lowers seizure threshold    Patient Measurements: Weight: 61.1 kg (134 lb 11.2 oz)   Labs: Recent Labs    05/28/21 0218 05/28/21 1510 05/29/21 0608 05/29/21 1019 05/29/21 1736 05/30/21 0646 05/30/21 1632  WBC 6.4  --  7.7  --   --  8.0  --   HGB 9.4*  --  9.5*  --   --  8.9*  --   HCT 28.6*  --  29.9*  --   --  28.7*  --   PLT 287  --  313  --   --  268  --   CREATININE 0.51  --  0.59   < > 0.45 0.43* 0.47  MG 1.5*  --  1.7  --  1.9 1.9 1.6*  PHOS  --    < > <1.0*  --  2.3* 2.5 2.2*   < > = values in this interval not displayed.   Estimated Creatinine Clearance: 62.2 mL/min (by C-G formula based on SCr of 0.47 mg/dL).  Assessment: Pharmacy helping with electrolyte management in pt with refeeding post TF initiation. K has normalized Mg and Phos continue to be on low side.  Goal of Therapy:  K 3.5-5.1 mmol/l, Mg 1.7-2.4 mg/dl Phos 2.9-9.2 mg/dl  Plan:  Sodium phosphate IV x 1  Magnesium sulfate 4gm IV now Hopefully lytes ok in a.m. and can sign off consult  Christoper Fabian, PharmD, BCPS Please see amion for complete clinical pharmacist phone list 05/30/2021,5:46 PM

## 2021-05-30 NOTE — Progress Notes (Signed)
Pt became disoriented and pulled out both IV accesses causing a quarter sized skin tear to left wrist/posterior hand.  Cleaned with NS.  Skin flap approximated as able.  Vaseline gauze and skin foam applied. Hilton Sinclair BSN RN CMSRN 05/30/2021, 4:11 AM

## 2021-05-31 ENCOUNTER — Inpatient Hospital Stay (HOSPITAL_COMMUNITY): Payer: Medicare Other

## 2021-05-31 DIAGNOSIS — R6520 Severe sepsis without septic shock: Secondary | ICD-10-CM | POA: Diagnosis not present

## 2021-05-31 DIAGNOSIS — A419 Sepsis, unspecified organism: Secondary | ICD-10-CM | POA: Diagnosis not present

## 2021-05-31 DIAGNOSIS — M7989 Other specified soft tissue disorders: Secondary | ICD-10-CM | POA: Diagnosis not present

## 2021-05-31 DIAGNOSIS — L0390 Cellulitis, unspecified: Secondary | ICD-10-CM | POA: Diagnosis not present

## 2021-05-31 DIAGNOSIS — N179 Acute kidney failure, unspecified: Secondary | ICD-10-CM | POA: Diagnosis not present

## 2021-05-31 LAB — CBC WITH DIFFERENTIAL/PLATELET
Abs Immature Granulocytes: 0 10*3/uL (ref 0.00–0.07)
Band Neutrophils: 0 %
Basophils Absolute: 0 10*3/uL (ref 0.0–0.1)
Basophils Relative: 0 %
Blasts: 0 %
Eosinophils Absolute: 0.5 10*3/uL (ref 0.0–0.5)
Eosinophils Relative: 6 %
HCT: 35 % — ABNORMAL LOW (ref 36.0–46.0)
Hemoglobin: 10.9 g/dL — ABNORMAL LOW (ref 12.0–15.0)
Lymphocytes Relative: 8 %
Lymphs Abs: 0.6 10*3/uL — ABNORMAL LOW (ref 0.7–4.0)
MCH: 28.2 pg (ref 26.0–34.0)
MCHC: 31.1 g/dL (ref 30.0–36.0)
MCV: 90.7 fL (ref 80.0–100.0)
Metamyelocytes Relative: 0 %
Monocytes Absolute: 0.5 10*3/uL (ref 0.1–1.0)
Monocytes Relative: 6 %
Myelocytes: 0 %
Neutro Abs: 6.4 10*3/uL (ref 1.7–7.7)
Neutrophils Relative %: 80 %
Other: 0 %
Platelets: UNDETERMINED 10*3/uL (ref 150–400)
Promyelocytes Relative: 0 %
RBC: 3.86 MIL/uL — ABNORMAL LOW (ref 3.87–5.11)
RDW: 16.4 % — ABNORMAL HIGH (ref 11.5–15.5)
WBC: 8 10*3/uL (ref 4.0–10.5)
nRBC: 0 % (ref 0.0–0.2)
nRBC: 0 /100 WBC

## 2021-05-31 LAB — MAGNESIUM: Magnesium: 2.4 mg/dL (ref 1.7–2.4)

## 2021-05-31 LAB — BASIC METABOLIC PANEL
Anion gap: 7 (ref 5–15)
BUN: 25 mg/dL — ABNORMAL HIGH (ref 8–23)
CO2: 23 mmol/L (ref 22–32)
Calcium: 7.9 mg/dL — ABNORMAL LOW (ref 8.9–10.3)
Chloride: 110 mmol/L (ref 98–111)
Creatinine, Ser: 0.47 mg/dL (ref 0.44–1.00)
GFR, Estimated: >60 mL/min (ref >60)
Glucose, Bld: 100 mg/dL — ABNORMAL HIGH (ref 70–99)
Potassium: 4.5 mmol/L (ref 3.5–5.1)
Sodium: 140 mmol/L (ref 135–145)

## 2021-05-31 LAB — GLUCOSE, CAPILLARY
Glucose-Capillary: 100 mg/dL — ABNORMAL HIGH (ref 70–99)
Glucose-Capillary: 101 mg/dL — ABNORMAL HIGH (ref 70–99)
Glucose-Capillary: 102 mg/dL — ABNORMAL HIGH (ref 70–99)
Glucose-Capillary: 109 mg/dL — ABNORMAL HIGH (ref 70–99)
Glucose-Capillary: 111 mg/dL — ABNORMAL HIGH (ref 70–99)
Glucose-Capillary: 115 mg/dL — ABNORMAL HIGH (ref 70–99)
Glucose-Capillary: 126 mg/dL — ABNORMAL HIGH (ref 70–99)

## 2021-05-31 LAB — PHOSPHORUS: Phosphorus: 3.2 mg/dL (ref 2.5–4.6)

## 2021-05-31 MED ORDER — PROSOURCE PLUS PO LIQD
30.0000 mL | Freq: Two times a day (BID) | ORAL | Status: DC
  Administered 2021-05-31 – 2021-06-03 (×6): 30 mL via ORAL
  Filled 2021-05-31 (×7): qty 30

## 2021-05-31 MED ORDER — BOOST / RESOURCE BREEZE PO LIQD CUSTOM
1.0000 | Freq: Three times a day (TID) | ORAL | Status: DC
  Administered 2021-05-31: 1 via ORAL

## 2021-05-31 MED ORDER — PHENOL 1.4 % MT LIQD
1.0000 | OROMUCOSAL | Status: DC | PRN
  Administered 2021-05-31: 1 via OROMUCOSAL
  Filled 2021-05-31: qty 177

## 2021-05-31 MED ORDER — OSMOLITE 1.5 CAL PO LIQD
840.0000 mL | ORAL | Status: DC
  Administered 2021-05-31 – 2021-06-02 (×3): 840 mL

## 2021-05-31 NOTE — Progress Notes (Signed)
Pharmacy Antibiotic Note  [name] is a 61 y.o. female admitted on 05/25/2021 with sepsis secondary to LLE cellulitis.  Pharmacy has been consulted for Unasyn and Vanc dosing.  Patient with multiple sclerosis, severe malnutrition and muscle depletion. Doses  adjusted using SCr of 0.8 due to this.   Changed from Vancomycin to Zyvox on 05/28/21 and ID pharm aware.   Patient transferred out of [facility] 05/29/21.  Septic shock resolved.. WBC wnl,  Blood cultures no growth/ final and remains afebrile.   ID pharmacist following, recommending would aim for 7 days of antibiotic  reatment here which is today.  F/u on 5/23 for stop date.  Plan: Continue Unasyn 3 g IV q8h Continue Zyvox 600mg  IV q12h Monitor clinical status, renal function and culture results daily.  Consider D/C unasyn and zyvox end of today 5/22 to complete 7 days of antibiotic therapy.    Weight: 66.3 kg (146 lb 2.6 oz)  Temp (24hrs), Avg:98.3 F (36.8 C), Min:97.6 F (36.4 C), Max:99.1 F (37.3 C)  Recent Labs  Lab 05/25/21 1608 05/25/21 1620 05/25/21 1803 05/26/21 0330 05/26/21 0624 05/26/21 0806 05/27/21 0726 05/27/21 1406 05/28/21 0218 05/29/21 0608 05/29/21 1019 05/29/21 1736 05/30/21 0646 05/30/21 1632 05/31/21 0244  WBC 13.9*  --   --  7.0  --  8.9 5.6  --  6.4 7.7  --   --  8.0  --  8.0  CREATININE  --   --    < > 1.02*  --  1.11*  1.14* 0.49   < > 0.51 0.59 0.56 0.45 0.43* 0.47 0.47  LATICACIDVEN 1.2 1.1  --  8.9* 1.9 1.3  --   --   --   --   --   --   --   --   --    < > = values in this interval not displayed.     Estimated Creatinine Clearance: 67.4 mL/min (by C-G formula based on SCr of 0.47 mg/dL).    Allergies  Allergen Reactions   Lamictal [Lamotrigine] Rash   Other     Cats: Rash  Shrimp scampi sauce: rash   Tramadol     Lowers seizure threshold    Antimicrobials this admission: Cefepime 5/16 >> 5/18 Vanc 5/16 >>5/17; 5/18 >5/19 Unasyn 5/18 >>  Zyvox 5/19>>  Microbiology:   5/16 BCx x2:  negative-F 5/17 MRSA PCR: neg  Thank you for allowing pharmacy to be a part of this patient's care. 6/17, RPh Clinical Pharmacist [phone] Please refer to [facility] for [facility] Pharmacy numbers 05/31/2021, 11:23 AM

## 2021-05-31 NOTE — Progress Notes (Signed)
Physical Therapy Treatment Patient Details Name: [name] MRN: [id number] DOB: 1960-11-17 Today's Date: 05/31/2021   History of Present Illness Pt adm on 5/17 due to worsening cellulitis in RLE and concerns for Septic Shock. Recently hospitalized on 4/17 with septic shock due to RLE cellulitis and PNA. Pt developed acute hypoxic respiratory failure and placed on bipap 4/19. Developed iatrogenic pneumothorax and chest tube placed. Discharged 5/9. PMH - Multiple Sclerosis, seizure, ankylosing spondylitis, fibromyalgia    PT Comments    Patient received in bed, she is agreeable to PT session. Patient found to be soiled once covers removed. Assisted RN and NT in cleaning patient, changing linens. Patient assisted in sitting up on side of bed with min A and HOB at maximum elevation. Patient able to sit edge of bed without external support. She is able to transfer with Max +1-2 assist from bed >< BSC. Patient continuing to have loose stool throughout session. She will continue to benefit from skilled PT while here to improve functional independence and improve strength for reduced caregiver burden.       Recommendations for follow up therapy are one component of a multi-disciplinary discharge planning process, led by the attending physician.  Recommendations may be updated based on patient status, additional functional criteria and insurance authorization.  Follow Up Recommendations  Home health PT     Assistance Recommended at Discharge Frequent or constant Supervision/Assistance  Patient can return home with the following A lot of help with bathing/dressing/bathroom;Assistance with cooking/housework;Direct supervision/assist for medications management;Direct supervision/assist for financial management;Assist for transportation;Help with stairs or ramp for entrance;A lot of help with walking and/or transfers   Equipment Recommendations   (I believe she has all equipment needed already)     Recommendations for Other Services       Precautions / Restrictions Precautions Precautions: Fall Precaution Comments: core trak Restrictions Weight Bearing Restrictions: No     Mobility  Bed Mobility Overal bed mobility: Needs Assistance Bed Mobility: Supine to Sit, Sit to Supine, Rolling, Sidelying to Sit Rolling: Min assist Sidelying to sit: Min assist   Sit to supine: Max assist, +2 for physical assistance   General bed mobility comments: patient able to roll and perform sidelying to sit with HOB elevated and min A. Required max +2 for sit to supine due to patient sitting edge of bed after transferring to [facility] and back    Transfers Overall transfer level: Needs assistance Equipment used: None Transfers: Sit to/from Stand, Bed to chair/wheelchair/BSC Sit to Stand: Max assist Stand pivot transfers: Max assist, +2 physical assistance   Squat pivot transfers: Max assist, +2 physical assistance     General transfer comment: patient transferred bed><BSC    Ambulation/Gait               General Gait Details: unable   Stairs             Wheelchair Mobility    Modified Rankin (Stroke Patients Only)       Balance Overall balance assessment: Needs assistance Sitting-balance support: Feet supported, Bilateral upper extremity supported Sitting balance-Leahy Scale: Fair Sitting balance - Comments: able to sit edge of bed and perform LAQ without external assist   Standing balance support: Bilateral upper extremity supported, During functional activity Standing balance-Leahy Scale: Poor Standing balance comment: Requires max support for sit to stand and pivot transfer  Cognition Arousal/Alertness: Awake/alert Behavior During Therapy: WFL for tasks assessed/performed Overall Cognitive Status: Impaired/Different from baseline Area of Impairment: Orientation, Awareness, Memory                 Orientation  Level: Disoriented to, Time, Situation Current Attention Level: Sustained Memory: Decreased short-term memory Following Commands: Follows one step commands consistently Safety/Judgement: Decreased awareness of safety, Decreased awareness of deficits Awareness: Emergent Problem Solving: Slow processing, Requires verbal cues, Requires tactile cues General Comments: patient cooperative, disoriented at times. States she was walking with walker, but then states that was about a month ago. ( per chart husband was transferring her to wheelchair)        Exercises Other Exercises Other Exercises: LAQ while seated at edge of bed    General Comments        Pertinent Vitals/Pain Pain Assessment Faces Pain Scale: Hurts a little bit Pain Location: Legs, bottom Pain Descriptors / Indicators: Discomfort, Sore Pain Intervention(s): Monitored during session, Repositioned    Home Living                          Prior Function            PT Goals (current goals can now be found in the care plan section) Acute Rehab PT Goals Patient Stated Goal: to continue HHPT PT Goal Formulation: With patient Time For Goal Achievement: 06/09/21 Potential to Achieve Goals: Fair Progress towards PT goals: Progressing toward goals    Frequency    Min 3X/week      PT Plan Current plan remains appropriate    Co-evaluation              AM-PAC PT "6 Clicks" Mobility   Outcome Measure  Help needed turning from your back to your side while in a flat bed without using bedrails?: A Little Help needed moving from lying on your back to sitting on the side of a flat bed without using bedrails?: A Little Help needed moving to and from a bed to a chair (including a wheelchair)?: A Lot Help needed standing up from a chair using your arms (e.g., wheelchair or bedside chair)?: A Lot Help needed to walk in hospital room?: Total Help needed climbing 3-5 steps with a railing? : Total 6 Click  Score: 12    End of Session Equipment Utilized During Treatment: Gait belt Activity Tolerance: Patient tolerated treatment well Patient left: in bed;with nursing/sitter in room Nurse Communication: Mobility status PT Visit Diagnosis: Other abnormalities of gait and mobility (R26.89);Muscle weakness (generalized) (M62.81) Pain - Right/Left:  (B) Pain - part of body: Leg     Time: 2458-0998 PT Time Calculation (min) (ACUTE ONLY): 44 min  Charges:  $Therapeutic Activity: 38-52 mins                     Missael Ferrari, PT, GCS 05/31/21,10:38 AM

## 2021-05-31 NOTE — Consult Note (Addendum)
[facility]    Date of Admission:  05/25/2021   Total days of inpatient antibiotics 6        Reason for Consult: Cellulitis    Principal Problem:   Sepsis ([place]) Active Problems:   Cellulitis of lower extremity   Ulcer, pressure   Protein-calorie malnutrition, severe   Assessment: 23 YF with MS, recent admission 4/17-5/9 for RLE cellulitis admitted for septic chock 2/2 LE cellulitis -RLE wound started after ocrelizumab x 4 weeks which was started on Feb, 2023   #RLE wound-concern for overlying cellulitis #Septic shock-resolved #Hx of MS -ABI on 5/22 showed  no significant evidence of RLE arterial disease and no evence of LLE arterial disease. -It's unclear if she had LE cellulitis on admission as she had no fevers and only mild leukocytosis. Regardless she has been treated with 7 days of antibiotics which is sufficient for cellulitis. Of note ocrelizumab has been associated with skin infection. The RLE scaling is pretty significant. As such would engage wound care to prevent further episodes Recommendations:  -Engage wound care for LE wounds -D/C antibiotics. She had mild leukocytosis on admission.   Microbiology:   Antibiotics: Vancomycin 5/16-5/18 Cefepime 5/16-5/18 Linezolid 5/19-p Unasyn 5/18-p  Cultures: Blood 5/16 NG   HPI: [name] is a 61 y.o. female with PMH significant for MS, Ankylosing spondylitis ambulates with a walker, fibromyalgia, seizure disorder, GERD. She wasstarted on ocrelizumab in feb, 2023 and subsequently developed swelling, scalling and redness of RLE c/b admission 4/17-5/9 for RLE cellulitis, aspiration PNA and pneumothorax SP chest tube placement-she recieved about 2 weeks of antibiotics. On 5/16, she was sent to the ED for worsening lower extremity wounds and admitted for septic shock 2/2 Lower extremity wounds. In the ED she was MAP in low 60s requiring pressors. She was noted to have wbc 13.9K,started  on vancomycin and cefepime transtioned to linezolid and unasyn. Blood Cx remain negative. ID engaged for antibioics recommendation.    Review of Systems: Review of Systems  All other systems reviewed and are negative.  Past Medical History:  Diagnosis Date   Allergy    seasonal   Anemia    GERD (gastroesophageal reflux disease)    HLA B27 (HLA B27 positive)    Hx: UTI (urinary tract infection)    Hyperplastic colon polyp    Hypertension    IBS (irritable bowel syndrome)    Multiple sclerosis (HCC)    Seizures (HCC)    hx epilepsy-last seizure 9/14 after taking tramadol    Social History   Tobacco Use   Smoking status: Every Day    Packs/day: 1.00    Years: 30.00    Pack years: 30.00    Types: Cigarettes   Smokeless tobacco: Never   Tobacco comments:    1 ppd  Substance Use Topics   Alcohol use: Yes    Alcohol/week: 1.0 standard drink    Types: 1 Glasses of wine per week    Comment: OCC.   Drug use: No    Family History  Problem Relation Age of Onset   Heart disease Father    Breast cancer Maternal Grandmother    Diabetes Maternal Aunt    Colon cancer Neg Hx    Irritable bowel syndrome Sister    Irritable bowel syndrome Brother    Scheduled Meds:  (feeding supplement) PROSource Plus  30 mL Oral BID WC   Chlorhexidine Gluconate Cloth  6 each Topical  Daily   feeding supplement  1 Container Oral TID BM   gabapentin  300 mg Oral BID   heparin  5,000 Units Subcutaneous Q8H   insulin aspart  0-9 Units Subcutaneous Q4H   levETIRAcetam  500 mg Oral BID   mouth rinse  15 mL Mouth Rinse BID   midodrine  10 mg Oral Q8H   nutrition supplement (JUVEN)  1 packet Per Tube BID BM   nystatin cream   Topical BID   pantoprazole  40 mg Oral QHS   Continuous Infusions:  sodium chloride 10 mL/hr at 05/31/21 0800   ampicillin-sulbactam (UNASYN) IV 3 g (05/31/21 1224)   feeding supplement (OSMOLITE 1.5 CAL) 1,000 mL (05/30/21 1630)   linezolid (ZYVOX) IV 600 mg (05/31/21  0948)   PRN Meds:.sodium chloride, docusate sodium, hydrOXYzine, ondansetron (ZOFRAN) IV, oxyCODONE, phenol, polyethylene glycol, white petrolatum Allergies  Allergen Reactions   Lamictal [Lamotrigine] Rash   Other     Cats: Rash  Shrimp scampi sauce: rash   Tramadol     Lowers seizure threshold    OBJECTIVE: Blood pressure 135/85, pulse (!) 103, temperature 97.6 F (36.4 C), temperature source Oral, resp. rate 18, weight 66.3 kg, SpO2 95 %.  Physical Exam Vitals reviewed.  Constitutional:      Appearance: Normal appearance.  HENT:     Head: Normocephalic and atraumatic.     Right Ear: Tympanic membrane normal.     Left Ear: Tympanic membrane normal.     Nose: Nose normal.     Mouth/Throat:     Mouth: Mucous membranes are moist.  Eyes:     Extraocular Movements: Extraocular movements intact.     Conjunctiva/sclera: Conjunctivae normal.     Pupils: Pupils are equal, round, and reactive to light.  Cardiovascular:     Rate and Rhythm: Normal rate and regular rhythm.     Heart sounds: No murmur heard.   No friction rub. No gallop.  Pulmonary:     Effort: Pulmonary effort is normal.     Breath sounds: Normal breath sounds.  Abdominal:     General: Abdomen is flat.     Palpations: Abdomen is soft.  Musculoskeletal:        General: Normal range of motion.  Skin:    General: Skin is warm and dry.     Comments: RLE-leg scaling  Neurological:     General: No focal deficit present.     Mental Status: She is alert and oriented to person, place, and time.  Psychiatric:        Mood and Affect: Mood normal.    Lab Results Lab Results  Component Value Date   WBC 8.0 05/31/2021   HGB 10.9 (L) 05/31/2021   HCT 35.0 (L) 05/31/2021   MCV 90.7 05/31/2021   PLT PLATELET CLUMPS NOTED ON SMEAR, UNABLE TO ESTIMATE 05/31/2021    Lab Results  Component Value Date   CREATININE 0.47 05/31/2021   BUN 25 (H) 05/31/2021   NA 140 05/31/2021   K 4.5 05/31/2021   CL 110 05/31/2021    CO2 23 05/31/2021    Lab Results  Component Value Date   ALT 13 05/25/2021   AST 12 (L) 05/25/2021   ALKPHOS 80 05/25/2021   BILITOT 0.8 05/25/2021       Laurice Record, [facility] 05/31/2021, 2:10 PM

## 2021-05-31 NOTE — Progress Notes (Signed)
---   05/31/21 : Electrolytes: K, Mg and Phos are within  goal today. Pharmacy is signing off consult. See Assessment /plan below.  Noah Delaine, RPh Clinical Pharmacist 05/31/2021 10:10 AM  MEDICATION RELATED CONSULT NOTE -Follow up   Pharmacy Consult for electrolyte replacement Indication: pt at risk of refeeding  Allergies  Allergen Reactions   Lamictal [Lamotrigine] Rash   Other     Cats: Rash  Shrimp scampi sauce: rash   Tramadol     Lowers seizure threshold    Patient Measurements: Weight: 66.3 kg (146 lb 2.6 oz)   Labs: Recent Labs    05/29/21 0608 05/29/21 1019 05/30/21 0646 05/30/21 1632 05/31/21 0244  WBC 7.7  --  8.0  --  8.0  HGB 9.5*  --  8.9*  --  10.9*  HCT 29.9*  --  28.7*  --  35.0*  PLT 313  --  268  --  PLATELET CLUMPS NOTED ON SMEAR, UNABLE TO ESTIMATE  CREATININE 0.59   < > 0.43* 0.47 0.47  MG 1.7   < > 1.9 1.6* 2.4  PHOS <1.0*   < > 2.5 2.2* 3.2   < > = values in this interval not displayed.    Estimated Creatinine Clearance: 67.4 mL/min (by C-G formula based on SCr of 0.47 mg/dL).  Assessment: Pharmacy helping with electrolyte management in pt with refeeding post TF initiation.  Replacement for K, Phos and Mg given yesterday.   05/31/21:  K = 4.5 mmol/l, Mg = 2.4 mg/dl and Phos =  3.2 mg/dl.  Electrolytes: K, Mg and Phos are within  goals noted below.  Pharmacy will sign off.   Goal of Therapy:  K 3.5-5.1 mmol/l, Mg 1.7-2.4 mg/dl Phos 3.3-3.5 mg/dl  Plan:  Electrolytes: K, Mg and Phos are within  goal Pharmacy is signing off consult. Re-consult pharmacist if further assistance is needed.   Thank you for allowing pharmacy to be part of this patients care team.  Noah Delaine, RPh Clinical Pharmacist [phone] Please see amion for complete clinical pharmacist phone list 05/31/2021,10:03 AM

## 2021-05-31 NOTE — Progress Notes (Addendum)
PROGRESS NOTE  BRANDEIS DIMEO  DOB: 1960-04-25  PCP: [facility] [id number]  [place]: 05/25/2021  LOS: 5 days  Hospital Day: 7  Brief narrative: SONOMA THISSEN is a 61 y.o. female with PMH significant for multiple sclerosis, ankylosing spondylitis (walks with a walker), fibromyalgia, seizure disorder, GERD.   Patient was started on ocrelizumab back in Feb after about 4 weeks of treatment, patient started having increased swelling, scaling and redness of right lower extremity.  She was subsequently admitted 4/17-5/9 for right lower extremity cellulitis, aspiration pneumonia, bilateral pneumothoraces s/p chest tube placements and ultimately discharged home with home health.   5/16, patient was sent to the ED by his wound nurse for continuous worsening of lower extremity wound.   In the ED, her blood pressure dipped down to 60s requiring fluid boluses and pressors. She was admitted to ICU and remained on pressors till 5/20 5/21, transferred out to [facility] service.  Subjective: Patient was seen and examined this morning.   Pleasant elderly Caucasian female. Feels weak. Propped up in bed.  Has core track feeding in place.    Principal Problem:   Sepsis ([place]) Active Problems:   Cellulitis of lower extremity   Ulcer, pressure   Protein-calorie malnutrition, severe    Assessment and Plan: Septic shock secondary to lower extremity cellulitis -Per history, her cellulitis started to worsen after she was started on immunosuppressants in February. -In April she had prolonged hospitalization with septic shock due to those wounds. Similar presentation and severity this time again. -Admitted to ICU for septic shock.  Improved with antibiotics, pressures improving. -No clear growth on blood culture -Currently Unasyn, linezolid.  ID consult for antibiotic choices at discharge. -WBC count and lactic acid level normalized. -Midodrine 10 mg 3 times daily for blood pressure  support -Continue daily wound care.  Needs outpatient wound care follow-up -Oxycodone as needed for pain Recent Labs  Lab 05/25/21 1608 05/25/21 1620 05/26/21 0330 05/26/21 0624 05/26/21 0806 05/27/21 0726 05/28/21 0218 05/29/21 0608 05/30/21 0646 05/31/21 0244  WBC 13.9*  --  7.0  --  8.9 5.6 6.4 7.7 8.0 8.0  LATICACIDVEN 1.2 1.1 8.9* 1.9 1.3  --   --   --   --   --   PROCALCITON  --   --  <0.10  --   --   --   --   --   --   --     Hypokalemia/hypomagnesemia/hypophosphatemia Severe protein calorie malnutrition Refeeding syndrome -All electrolyte levels were low because of poor oral intake. After tube feeding was initiated, she went to refeeding syndrome causing electrolytes to be further low. Adequate replacement was given. Levels normalized. Continue to monitor. -Currently on dysphagia 3 diet as well as tube feeding through core track tube. Recent Labs  Lab 05/29/21 0608 05/29/21 1019 05/29/21 1736 05/30/21 0646 05/30/21 1632 05/31/21 0244  K 4.2 3.5 3.4* 4.6 4.6 4.5  MG 1.7  --  1.9 1.9 1.6* 2.4  PHOS <1.0*  --  2.3* 2.5 2.2* 3.2    Normocytic anemia GERD -Hemoglobin level chronically less than 8.  Remains stable.  Continue to monitor  -Continue PPI Recent Labs    05/27/21 0726 05/28/21 0218 05/29/21 0608 05/30/21 0646 05/31/21 0244  HGB 9.2* 9.4* 9.5* 8.9* 10.9*  MCV 85.9 87.7 89.8 90.0 90.7    Seizure disorder - Continue home Keppra   Multiple sclerosis Impaired mobility -Started on Ocrelizumab in February, will need to follow-up with outpatient neurology. -Ambulates with  walker at baseline -Seen by PT on 5/19.  Home with PT recommended. -PTA, on Neurontin 300 mg twice daily.  Continue same  Anxiety disorder -On hydroxyzine 25 mg twice daily as needed  Goals of care   Code Status: Full Code    Mobility: Seen by PT.  Home health PT recommended  Skin assessment:  Pressure Injury 05/26/21 Sacrum Mid;Lower Stage 3 -  Full thickness tissue  loss. Subcutaneous fat may be visible but bone, tendon or muscle are NOT exposed. (Active)  05/26/21 0430  Location: Sacrum  Location Orientation: Mid;Lower  Staging: Stage 3 -  Full thickness tissue loss. Subcutaneous fat may be visible but bone, tendon or muscle are NOT exposed.  Wound Description (Comments):   Present on Admission: Yes     Pressure Injury 05/26/21 Left Stage 3 -  Full thickness tissue loss. Subcutaneous fat may be visible but bone, tendon or muscle are NOT exposed. Left Inner Gluteal Fold (Active)  05/26/21 1728  Location:   Location Orientation: Left  Staging: Stage 3 -  Full thickness tissue loss. Subcutaneous fat may be visible but bone, tendon or muscle are NOT exposed.  Wound Description (Comments): Left Inner Gluteal Fold  Present on Admission: Yes    Nutritional status:  Body mass index is 25.09 kg/m.  Nutrition Problem: Severe Malnutrition Etiology: acute illness (sepsis, cellulitis causing poor appetite/intake) Signs/Symptoms: energy intake < or equal to 50% for > or equal to 5 days, severe fat depletion     Diet:  Diet Order             DIET DYS 3 Room service appropriate? Yes with Assist; Fluid consistency: Thin  Diet effective now                   DVT prophylaxis:  heparin injection 5,000 Units Start: 05/26/21 0600   Antimicrobials: Currently on linezolid and Unasyn Fluid: None Consultants: PCCM, ID Family Communication: None at bedside  Status is: Inpatient  Continue in-hospital care because: Remains on IV antibiotics.  Has core track tube in place. Level of care: Telemetry Medical   Dispo: The patient is from: Home              Anticipated d/c is to: Hopefully home with home health PT in the next 1 to 2 days.              Patient currently is not medically stable to d/c.   Difficult to place patient No     Infusions:   sodium chloride 10 mL/hr at 05/31/21 0800   ampicillin-sulbactam (UNASYN) IV Stopped (05/31/21 [id number])    feeding supplement (OSMOLITE 1.5 CAL) 1,000 mL (05/30/21 1630)   linezolid (ZYVOX) IV 600 mg (05/31/21 0948)    Scheduled Meds:  Chlorhexidine Gluconate Cloth  6 each Topical Daily   feeding supplement (PROSource TF)  45 mL Per Tube TID   gabapentin  300 mg Oral BID   heparin  5,000 Units Subcutaneous Q8H   insulin aspart  0-9 Units Subcutaneous Q4H   levETIRAcetam  500 mg Oral BID   mouth rinse  15 mL Mouth Rinse BID   midodrine  10 mg Oral Q8H   nutrition supplement (JUVEN)  1 packet Per Tube BID BM   nystatin cream   Topical BID   pantoprazole  40 mg Oral QHS    PRN meds: sodium chloride, docusate sodium, hydrOXYzine, ondansetron (ZOFRAN) IV, oxyCODONE, polyethylene glycol, white petrolatum   Antimicrobials: Anti-infectives (From admission, onward)  Start     Dose/Rate Route Frequency Ordered Stop   05/29/21 2200  vancomycin (VANCOREADY) IVPB 1250 mg/250 mL  Status:  Discontinued        1,250 mg 166.7 mL/hr over 90 Minutes Intravenous Every 24 hours 05/28/21 1033 05/28/21 1132   05/28/21 2200  linezolid (ZYVOX) IVPB 600 mg        600 mg 300 mL/hr over 60 Minutes Intravenous Every 12 hours 05/28/21 1132     05/27/21 1030  Ampicillin-Sulbactam (UNASYN) 3 g in sodium chloride 0.9 % 100 mL IVPB        3 g 200 mL/hr over 30 Minutes Intravenous Every 8 hours 05/27/21 0931     05/27/21 1030  vancomycin (VANCOCIN) IVPB 1000 mg/200 mL premix  Status:  Discontinued        1,000 mg 200 mL/hr over 60 Minutes Intravenous Every 12 hours 05/27/21 0931 05/28/21 1033   05/26/21 1600  ceFEPIme (MAXIPIME) 2 g in sodium chloride 0.9 % 100 mL IVPB  Status:  Discontinued        2 g 200 mL/hr over 30 Minutes Intravenous Every 12 hours 05/26/21 0425 05/27/21 0928   05/26/21 0431  vancomycin variable dose per unstable renal function (pharmacist dosing)  Status:  Discontinued         Does not apply See admin instructions 05/26/21 0431 05/26/21 0912   05/26/21 0430  ceFEPIme (MAXIPIME) 2 g in  sodium chloride 0.9 % 100 mL IVPB        2 g 200 mL/hr over 30 Minutes Intravenous  Once 05/26/21 0425 05/26/21 0626   05/25/21 1600  vancomycin (VANCOCIN) IVPB 1000 mg/200 mL premix        1,000 mg 200 mL/hr over 60 Minutes Intravenous  Once 05/25/21 1552 05/25/21 1747   05/25/21 1600  ceFEPIme (MAXIPIME) 2 g in sodium chloride 0.9 % 100 mL IVPB        2 g 200 mL/hr over 30 Minutes Intravenous  Once 05/25/21 1552 05/25/21 1731       Objective: Vitals:   05/30/21 2041 05/31/21 0412  BP: 131/78 137/83  Pulse: 99 100  Resp: 16 13  Temp: 98.6 F (37 C) 99.1 F (37.3 C)  SpO2: 96% 97%    Intake/Output Summary (Last 24 hours) at 05/31/2021 1058 Last data filed at 05/31/2021 0800 Gross per 24 hour  Intake 2807.57 ml  Output 2150 ml  Net 657.57 ml    Filed Weights   05/26/21 0432 05/27/21 0500 05/31/21 0412  Weight: 58.2 kg 61.1 kg 66.3 kg   Weight change:  Body mass index is 25.09 kg/m.   Physical Exam: General exam: Pleasant, middle-aged Caucasian female.  Looks older for age.  Feels weak Skin: No rashes, lesions or ulcers. HEENT: Atraumatic, normocephalic, no obvious bleeding.  Has a core track tube for feeding Lungs: Clear to auscultation bilaterally CVS: Regular rate and rhythm, no murmur GI/Abd soft, nontender, nondistended, bowel sound present CNS: Alert, awake, oriented x3 Psychiatry: Sad affect Extremities: Bandages bilateral lower extremities.  Skin wrinklings in both legs noted  Data Review: I have personally reviewed the laboratory data and studies available.  F/u labs ordered Unresulted Labs (From admission, onward)     Start     Ordered   05/31/21 0500  CBC with Differential/Platelet  Daily,   R     Question:  Specimen collection method  Answer:  Lab=Lab collect   05/30/21 1106  Signed, Terrilee Croak, MD [facility] 05/31/2021

## 2021-05-31 NOTE — Progress Notes (Signed)
Speech Language Pathology Treatment: Dysphagia  Patient Details Name: [name] MRN: [id number] DOB: 10/28/1960 Today's Date: 05/31/2021 Time: 1500-1520 SLP Time Calculation (min) (ACUTE ONLY): 20 min  Assessment / Plan / Recommendation Clinical Impression  Pt seen for dysphagia f/u for diet tolerance/education post-initiation of Dysphagia 3/thin liquid diet with small sips/slow rate encouraged/implemented via mod independent verbal cues provided to pt during tx session.  Pt consumed Dysphagia 3/thin liquids via straw with min A for set up/feeding d/t overall deconditioning.  Pt exhibited one incidence of delayed throat clearing during presentation of POs during session, but this may be related to esophageal issues with dx noted of GERD and TF placed with pt stating "my throat is sore" with indwelling TF present for nutritional purposes d/t severe malnutrition/muscle depletion.  Throat clearing was not observed during remainder of trial.  Recommend continuing current diet of Dysphagia 3/thin liquids with general swallowing precautions in place d/t deconditioning including: slow rate, small bites/sips and esophageal precautions.  Education provided to pt/family re: swallowing safety with agreement noted by family/pt for implementation during meals/snacks.  ST will s/o in acute setting.    HPI HPI: Patient is a 61 y.o. female, recently admitted 4/17-5/9 for septic shock due to cellulitis of RLE, PMH: GERD, multiple sclerosis (ambulatory with walker), anklyosing spondylitis, seizure disorder, fibromyalgia. She presented from home to ED after  HH wound nurse noted worsening LE wounds. In ED has had persistent hypotension with no improvement with fluids. PCCM consulted given hypotension despite fluid resuscitation and concern for septic shock. She was started on a clear liquid diet. CXR showed No evidence of acute cardiopulmonary disease on 05/26/21.   TF placed for severe malnutrition and muscle depletion;  ST f/u for Dysphagia 3/thin liquid diet recommended after BSE completed on 05/26/21; MBS completed 05/13/21 suggesting Dysphagia 3/thin liquids.  ST continuing to f/u for diet tolerance/education.      SLP Plan  Discharge SLP treatment due to goals met.      Recommendations for follow up therapy are one component of a multi-disciplinary discharge planning process, led by the attending physician.  Recommendations may be updated based on patient status, additional functional criteria and insurance authorization.    Recommendations  Diet recommendations: Dysphagia 3 (mechanical soft);Thin liquid Liquids provided via: Cup;Straw Medication Administration: Whole meds with liquid Supervision: Patient able to self feed;Staff to assist with self feeding;Intermittent supervision to cue for compensatory strategies Compensations: Slow rate;Small sips/bites Postural Changes and/or Swallow Maneuvers: Seated upright 90 degrees;Upright 30-60 min after meal                Oral Care Recommendations: Oral care BID Follow Up Recommendations: No SLP follow up Assistance recommended at discharge: Intermittent Supervision/Assistance SLP Visit Diagnosis: Dysphagia, unspecified (R13.10) Plan: Discharge SLP treatment due to (comment)           Pat , M.S., CCC-SLP 05/31/2021, 4:02 PM 

## 2021-05-31 NOTE — Progress Notes (Signed)
Nutrition Follow-up  DOCUMENTATION CODES:   Severe malnutrition in context of acute illness/injury  INTERVENTION:   Pt is currently not meeting nutritional needs by mouth.   Cater to preferences; Encouraged pt to select cold foods like chicken salad; ok for husband to bring in outside food  Trial Boost Breeze po TID, each supplement provides 250 kcal and 9 grams of protein  30 ml ProSource Plus BID, each supplement provides 100 kcals and 15 grams protein.   Tube Feeding: change to Nocturnal TF Osmolite 1.5 at 70 ml/hr x 12 hours This provides 1260 kcals, 53 g of protein and 638 mL of free water  Continue Juven BID, each packet provides 80 calories, 8 grams of carbohydrate, 2.5  grams of protein (collagen), 7 grams of L-arginine and 7 grams of L-glutamine; supplement contains CaHMB, Vitamins C, E, B12 and Zinc to promote wound healing   NUTRITION DIAGNOSIS:   Severe Malnutrition related to acute illness (sepsis, cellulitis causing poor appetite/intake) as evidenced by energy intake < or equal to 50% for > or equal to 5 days, severe fat depletion.  Being addressed via supplements  GOAL:   Patient will meet greater than or equal to 90% of their needs  Progressing  MONITOR:   PO intake, Supplement acceptance, Labs, Skin  REASON FOR ASSESSMENT:   Rounds    ASSESSMENT:   61 yo female admitted with BLE cellulitis, hypotension, UTI. PMH includes multiple sclerosis, ankylosing spondylitis, GERD, seizure disorder, fibromyalgia, recent admission for cellulitis BLE and septic shock.  Tolerating Osmolite 1.5 at 45 ml/hr via Cortrak  PO intake remains poor. Pt also complains of sore throat today making it difficult to eat. Recorded po intake 10-50% of meals. Today pt ate 10% at breakfast, 25% at lunch.  Pt reports multiple issues with regards to the food; pt reports many foods taste like "the pan they were cooked in." The metal taste is unappealing and pt is unable to eat.  Encouraged pt to try cold items like chicken salad to see if this does better.   Pt receiving pudding on meal trays but not eating all the time  Pt does not like to do dairy or milky stuff due to IBS. Pt is agreeable to trying Boost Breeze and it is not "milky."  Current wt 66.3 kg; admit weight 61.1 kg  Labs: reviewed Meds: ss novolog    Diet Order:   Diet Order             DIET DYS 3 Room service appropriate? Yes with Assist; Fluid consistency: Thin  Diet effective now                   EDUCATION NEEDS:   Not appropriate for education at this time  Skin:  Skin Assessment: Skin Integrity Issues: Skin Integrity Issues:: Stage III, Other (Comment) Stage III: sacrum Other: BLE cellulitis; R foot non-pressure wound; MASD to sacrum & coccyx  Last BM:  5/22  Height:   Ht Readings from Last 1 Encounters:  04/26/21 5\' 4"  (1.626 m)    Weight:   Wt Readings from Last 1 Encounters:  05/31/21 66.3 kg     BMI:  Body mass index is 25.09 kg/m.  Estimated Nutritional Needs:   Kcal:  1700-1900  Protein:  90-110 gm  Fluid:  1.7-1.9 L   Kerman Passey MS, RDN, LDN, CNSC Registered Dietitian III Clinical Nutrition RD Pager and On-Call Pager Number Located in [place]

## 2021-05-31 NOTE — Progress Notes (Signed)
VASCULAR LAB    ABIs have been performed.  See CV proc for preliminary results.   Tay Whitwell, RVT 05/31/2021, 10:53 AM

## 2021-06-01 DIAGNOSIS — L03119 Cellulitis of unspecified part of limb: Secondary | ICD-10-CM | POA: Diagnosis not present

## 2021-06-01 DIAGNOSIS — R6520 Severe sepsis without septic shock: Secondary | ICD-10-CM | POA: Diagnosis not present

## 2021-06-01 DIAGNOSIS — A419 Sepsis, unspecified organism: Secondary | ICD-10-CM | POA: Diagnosis not present

## 2021-06-01 DIAGNOSIS — E43 Unspecified severe protein-calorie malnutrition: Secondary | ICD-10-CM

## 2021-06-01 LAB — GLUCOSE, CAPILLARY
Glucose-Capillary: 109 mg/dL — ABNORMAL HIGH (ref 70–99)
Glucose-Capillary: 110 mg/dL — ABNORMAL HIGH (ref 70–99)
Glucose-Capillary: 110 mg/dL — ABNORMAL HIGH (ref 70–99)
Glucose-Capillary: 83 mg/dL (ref 70–99)
Glucose-Capillary: 87 mg/dL (ref 70–99)
Glucose-Capillary: 87 mg/dL (ref 70–99)

## 2021-06-01 LAB — CBC WITH DIFFERENTIAL/PLATELET
Abs Immature Granulocytes: 0.2 10*3/uL — ABNORMAL HIGH (ref 0.00–0.07)
Basophils Absolute: 0 10*3/uL (ref 0.0–0.1)
Basophils Relative: 0 %
Eosinophils Absolute: 0.4 10*3/uL (ref 0.0–0.5)
Eosinophils Relative: 4 %
HCT: 27.6 % — ABNORMAL LOW (ref 36.0–46.0)
Hemoglobin: 8.5 g/dL — ABNORMAL LOW (ref 12.0–15.0)
Immature Granulocytes: 2 %
Lymphocytes Relative: 7 %
Lymphs Abs: 0.6 10*3/uL — ABNORMAL LOW (ref 0.7–4.0)
MCH: 28.1 pg (ref 26.0–34.0)
MCHC: 30.8 g/dL (ref 30.0–36.0)
MCV: 91.4 fL (ref 80.0–100.0)
Monocytes Absolute: 0.8 10*3/uL (ref 0.1–1.0)
Monocytes Relative: 9 %
Neutro Abs: 6.7 10*3/uL (ref 1.7–7.7)
Neutrophils Relative %: 78 %
Platelets: 188 10*3/uL (ref 150–400)
RBC: 3.02 MIL/uL — ABNORMAL LOW (ref 3.87–5.11)
RDW: 16.3 % — ABNORMAL HIGH (ref 11.5–15.5)
WBC: 8.6 10*3/uL (ref 4.0–10.5)
nRBC: 0 % (ref 0.0–0.2)

## 2021-06-01 MED ORDER — ADULT MULTIVITAMIN W/MINERALS CH
1.0000 | ORAL_TABLET | Freq: Every day | ORAL | Status: DC
  Administered 2021-06-01 – 2021-06-03 (×3): 1 via ORAL
  Filled 2021-06-01 (×4): qty 1

## 2021-06-01 MED ORDER — ASCORBIC ACID 500 MG PO TABS
500.0000 mg | ORAL_TABLET | Freq: Two times a day (BID) | ORAL | Status: DC
  Administered 2021-06-01 – 2021-06-03 (×4): 500 mg via ORAL
  Filled 2021-06-01 (×4): qty 1

## 2021-06-01 MED ORDER — LOPERAMIDE HCL 2 MG PO CAPS
2.0000 mg | ORAL_CAPSULE | ORAL | Status: DC | PRN
  Administered 2021-06-01 – 2021-06-03 (×5): 2 mg via ORAL
  Filled 2021-06-01 (×6): qty 1

## 2021-06-01 MED ORDER — ZINC SULFATE 220 (50 ZN) MG PO CAPS
220.0000 mg | ORAL_CAPSULE | Freq: Every day | ORAL | Status: DC
Start: 2021-06-01 — End: 2021-06-04
  Administered 2021-06-01 – 2021-06-03 (×3): 220 mg via ORAL
  Filled 2021-06-01 (×4): qty 1

## 2021-06-01 NOTE — Progress Notes (Signed)
PROGRESS NOTE    [name]  [id number] DOB: February 25, 1960 DOA: 05/25/2021 PCP: [facility]   Brief Narrative: [name] is a 61 y.o. female with PMH significant for multiple sclerosis, ankylosing spondylitis (walks with a walker), fibromyalgia, seizure disorder, GERD.    Patient was started on ocrelizumab back in Feb after about 4 weeks of treatment, patient started having increased swelling, scaling and redness of right lower extremity.  She was subsequently admitted 4/17-5/9 for right lower extremity cellulitis, aspiration pneumonia, bilateral pneumothoraces s/p chest tube placements and ultimately discharged home with home health.   5/16, patient was sent to the ED by his wound nurse for continuous worsening of lower extremity wound.    In the ED, her blood pressure dipped down to 60s requiring fluid boluses and pressors. She was admitted to ICU and remained    Assessment and Plan:  Septic shock Secondary to LE cellulitis. Patient required ICU admission and vasopressor (Levophed) support. Patient managed on antibiotics with improvement of shock. Vasopressor discontinued after 3 days. Blood cultures with no growth. Resolved.  Lower extremity cellulitis Patient treated empirically with vancomycin/cefepime and was transitioned to Linezolid/Unasyn. ID consulted with recommendations to discontinue antibiotic treatment.  Refeeding syndrome Patient with hypokalemia, hypomagnesemia and hypophosphatemia initially after starting tube feeds. Electrolytes repleted. Now resolved.  Severe malnutrition Tube feeds currently. Dietitian recommending nocturnal tube feeds to encourage daytime oral intake. Patient cleared for a dysphagia 3 diet.  GERD -Continue Protonix  Normocytic anemia Stable.  Seizure disorder -Continue Krppra  Diarrhea Likely related to tube feeds. -Imodium PRN  Multiple sclerosis Ambulates with a walker at baseline. -Continue  gabapentin  Anxiety -Continue hydroxyzine PRN  Pressure injury Mid/lower sacrum, POA   DVT prophylaxis: Heparin subq Code Status:   Code Status: Full Code Family Communication: Husband at bedside Disposition Plan: Discharge likely home once able to remove NG tube   Consultants:  PCCM Infectious disease  Procedures:    Antimicrobials: Vancomycin Cefepime Linezolid Unasyn    Subjective: Patient reports diarrheal issues. No other concerns this morning regarding medical issues.  Objective: BP 130/77 (BP Location: Left Arm)   Pulse 94   Temp 98.3 F (36.8 C)   Resp 18   Wt 66.3 kg   SpO2 97%   BMI 25.09 kg/m   Examination:  General exam: Appears calm and comfortable Respiratory system: Clear to auscultation. Respiratory effort normal. Cardiovascular system: S1 & S2 heard, RRR. Gastrointestinal system: Abdomen is nondistended, soft and nontender. Normal bowel sounds heard. Central nervous system: Alert and oriented. Musculoskeletal: No edema. No calf tenderness Skin: No cyanosis. No rashes Psychiatry: Judgement and insight appear normal. Mood & affect appropriate.    Data Reviewed: I have personally reviewed following labs and imaging studies  CBC Lab Results  Component Value Date   WBC 8.6 06/01/2021   RBC 3.02 (L) 06/01/2021   HGB 8.5 (L) 06/01/2021   HCT 27.6 (L) 06/01/2021   MCV 91.4 06/01/2021   MCH 28.1 06/01/2021   PLT 188 06/01/2021   MCHC 30.8 06/01/2021   RDW 16.3 (H) 06/01/2021   LYMPHSABS 0.6 (L) 06/01/2021   MONOABS 0.8 06/01/2021   EOSABS 0.4 06/01/2021   BASOSABS 0.0 [id number]     Last metabolic panel Lab Results  Component Value Date   NA 140 05/31/2021   K 4.5 05/31/2021   CL 110 05/31/2021   CO2 23 05/31/2021   BUN 25 (H) 05/31/2021   CREATININE 0.47 05/31/2021   GLUCOSE 100 (H)  05/31/2021   GFRNONAA >60 05/31/2021   GFRAA >90 12/29/2013   CALCIUM 7.9 (L) 05/31/2021   PHOS 3.2 05/31/2021   PROT 5.1 (L) 05/25/2021    ALBUMIN 2.5 (L) 05/26/2021   BILITOT 0.8 05/25/2021   ALKPHOS 80 05/25/2021   AST 12 (L) 05/25/2021   ALT 13 05/25/2021   ANIONGAP 7 05/31/2021    GFR: Estimated Creatinine Clearance: 67.4 mL/min (by C-G formula based on SCr of 0.47 mg/dL).  Recent Results (from the past 240 hour(s))  Blood culture (routine x 2)     Status: None   Collection Time: 05/25/21  4:08 PM   Specimen: BLOOD  Result Value Ref Range Status   Specimen Description BLOOD BLOOD RIGHT FOREARM  Final   Special Requests   Final    BOTTLES DRAWN AEROBIC AND ANAEROBIC Blood Culture adequate volume   Culture   Final    NO GROWTH 5 DAYS Performed at [facility], [address], [place], [place] [address]    Report Status 05/30/2021 FINAL  Final  Blood culture (routine x 2)     Status: None   Collection Time: 05/25/21  4:20 PM   Specimen: BLOOD RIGHT ARM  Result Value Ref Range Status   Specimen Description BLOOD RIGHT ARM  Final   Special Requests   Final    BOTTLES DRAWN AEROBIC AND ANAEROBIC Blood Culture adequate volume   Culture   Final    NO GROWTH 5 DAYS Performed at [facility], [place] [address] [place], [place] [address]    Report Status 05/30/2021 FINAL  Final  Resp Panel by RT-PCR (Flu A&B, Covid) Nasal Mucosa     Status: None   Collection Time: 05/26/21  4:31 AM   Specimen: Nasal Mucosa; Nasopharyngeal(NP) swabs in vial transport medium  Result Value Ref Range Status   SARS Coronavirus 2 by RT PCR NEGATIVE NEGATIVE Final    Comment: (NOTE) SARS-CoV-2 target nucleic acids are NOT DETECTED.  The SARS-CoV-2 RNA is generally detectable in upper respiratory specimens during the acute phase of infection. The lowest concentration of SARS-CoV-2 viral copies this assay can detect is 138 copies/mL. A negative result does not preclude SARS-Cov-2 infection and should not be used as the sole basis for treatment or other patient management decisions. A negative result may occur with   improper specimen collection/handling, submission of specimen other than nasopharyngeal swab, presence of viral mutation(s) within the areas targeted by this assay, and inadequate number of viral copies(<138 copies/mL). A negative result must be combined with clinical observations, patient history, and epidemiological information. The expected result is Negative.  Fact Sheet for Patients:  EntrepreneurPulse.com.au  Fact Sheet for Healthcare Providers:  IncredibleEmployment.be  This test is no t yet approved or cleared by the [place] FDA and  has been authorized for detection and/or diagnosis of SARS-CoV-2 by FDA under an Emergency Use Authorization (EUA). This EUA will remain  in effect (meaning this test can be used) for the duration of the COVID-19 declaration under Section 564(b)(1) of the Act, 21 U.S.C.section 360bbb-3(b)(1), unless the authorization is terminated  or revoked sooner.       Influenza A by PCR NEGATIVE NEGATIVE Final   Influenza B by PCR NEGATIVE NEGATIVE Final    Comment: (NOTE) The Xpert Xpress SARS-CoV-2/FLU/RSV plus assay is intended as an aid in the diagnosis of influenza from Nasopharyngeal swab specimens and should not be used as a sole basis for treatment. Nasal washings and aspirates are  unacceptable for Xpert Xpress SARS-CoV-2/FLU/RSV testing.  Fact Sheet for Patients: EntrepreneurPulse.com.au  Fact Sheet for Healthcare Providers: IncredibleEmployment.be  This test is not yet approved or cleared by the [place] FDA and has been authorized for detection and/or diagnosis of SARS-CoV-2 by FDA under an Emergency Use Authorization (EUA). This EUA will remain in effect (meaning this test can be used) for the duration of the COVID-19 declaration under Section 564(b)(1) of the Act, 21 U.S.C. section 360bbb-3(b)(1), unless the authorization is terminated  or revoked.  Performed at [facility], [place] [address] [place], [place] [address]   MRSA Next Gen by PCR, Nasal     Status: None   Collection Time: 05/26/21  4:31 AM   Specimen: Nasal Mucosa; Nasal Swab  Result Value Ref Range Status   MRSA by PCR Next Gen NOT DETECTED NOT DETECTED Final    Comment: (NOTE) The GeneXpert MRSA Assay (FDA approved for NASAL specimens only), is one component of a comprehensive MRSA colonization surveillance program. It is not intended to diagnose MRSA infection nor to guide or monitor treatment for MRSA infections. Test performance is not FDA approved in patients less than 71 years old. Performed at [facility], [place] [address], [place], [place] [address]       Radiology Studies: VAS [place] ABI WITH/WO TBI  Result Date: 05/31/2021  LOWER EXTREMITY DOPPLER STUDY Patient Name:  ARLINGTON RILE  Date of Exam:   05/31/2021 Medical Rec #: [id number]        Accession #:    [id number] Date of Birth: 1961-01-10        Patient Gender: F Patient Age:   30 years Exam Location:  [facility] Procedure:      VAS [place] ABI WITH/WO TBI Referring Phys: Regan Lemming --------------------------------------------------------------------------------  Indications: Scaling, swelling, redness, cellulitis of lower extremities. Other Factors: Multiple Sclerosis.  Comparison Study: No prior study on file Performing Technologist: Sharion Dove RVS  Examination Guidelines: A complete evaluation includes at minimum, Doppler waveform signals and systolic blood pressure reading at the level of bilateral brachial, anterior tibial, and posterior tibial arteries, when vessel segments are accessible. Bilateral testing is considered an integral part of a complete examination. Photoelectric Plethysmograph (PPG) waveforms and toe systolic pressure readings are included as required and additional duplex testing as needed. Limited examinations for reoccurring indications may be performed  as noted.  ABI Findings: +---------+------------------+-----+-----------+--------+ Right    Rt Pressure (mmHg)IndexWaveform   Comment  +---------+------------------+-----+-----------+--------+ Brachial 121                    multiphasic         +---------+------------------+-----+-----------+--------+ PTA      133               1.10 multiphasic         +---------+------------------+-----+-----------+--------+ DP       132               1.09 multiphasic         +---------+------------------+-----+-----------+--------+ Great Toe109               0.90                     +---------+------------------+-----+-----------+--------+ +---------+------------------+-----+-----------+----------------------------+ Left     Lt Pressure (mmHg)IndexWaveform   Comment                      +---------+------------------+-----+-----------+----------------------------+ Brachial  multiphasicIV covered in bandages at [facility] +---------+------------------+-----+-----------+----------------------------+ PTA      122               1.01 multiphasic                             +---------+------------------+-----+-----------+----------------------------+ DP       120               0.99 multiphasic                             +---------+------------------+-----+-----------+----------------------------+ Great Toe91                0.75                                         +---------+------------------+-----+-----------+----------------------------+ +-------+-----------+-----------+------------+------------+ ABI/TBIToday's ABIToday's TBIPrevious ABIPrevious TBI +-------+-----------+-----------+------------+------------+ Right  1.10       0.90                                +-------+-----------+-----------+------------+------------+ Left   1.01       0.75                                +-------+-----------+-----------+------------+------------+   Summary: Right: Resting right ankle-brachial index is within normal range. No evidence of significant right lower extremity arterial disease. The right toe-brachial index is normal. Left: Resting left ankle-brachial index is within normal range. No evidence of significant left lower extremity arterial disease. The left toe-brachial index is normal. *See table(s) above for measurements and observations.  Electronically signed by Monica Martinez MD on 05/31/2021 at 5:12:49 PM.    Final       LOS: 6 days    Cordelia Poche, MD [facility] 06/01/2021, 12:31 PM   If 7PM-7AM, please contact night-coverage [email or web address]

## 2021-06-01 NOTE — Progress Notes (Signed)
Occupational Therapy Treatment Patient Details Name: [name] MRN: [id number] DOB: 13-Dec-1960 Today's Date: 06/01/2021   History of present illness Pt adm on 5/17 due to worsening cellulitis in RLE and concerns for Septic Shock. Recently hospitalized on 4/17 with septic shock due to RLE cellulitis and PNA. Pt developed acute hypoxic respiratory failure and placed on bipap 4/19. Developed iatrogenic pneumothorax and chest tube placed. Discharged 5/9. PMH - Multiple Sclerosis, seizure, ankylosing spondylitis, fibromyalgia   OT comments  Pt presented with husband in room and was educated about the difference in SNF versus HH therapies but at this time reporting they want to go home. The family is currently attempting to instal a ramp but does not have at this time. Pt was able to complete supine to sitting with mod assistance and sitting to supine with max assistance. Pt completed lateral scooting at EOB but only able to complete three attempts with mod to max assistance. Will continue to follow with acute OT.    Recommendations for follow up therapy are one component of a multi-disciplinary discharge planning process, led by the attending physician.  Recommendations may be updated based on patient status, additional functional criteria and insurance authorization.    Follow Up Recommendations  Home health OT (Pt at this time declining SNF therapies at this time)    Assistance Recommended at Discharge Frequent or constant Supervision/Assistance  Patient can return home with the following  A lot of help with walking and/or transfers;A lot of help with bathing/dressing/bathroom;Assistance with cooking/housework;Assist for transportation   Equipment Recommendations       Recommendations for Other Services      Precautions / Restrictions Precautions Precautions: Fall Precaution Comments: core trak Restrictions Weight Bearing Restrictions: No       Mobility Bed Mobility Overal bed  mobility: Needs Assistance Bed Mobility: Supine to Sit, Sit to Supine Rolling: Min assist   Supine to sit: Mod assist, HOB elevated Sit to supine: Max assist, HOB elevated        Transfers Overall transfer level: Needs assistance Equipment used: None              Lateral/Scoot Transfers: Max assist General transfer comment: attempted 3 trials f lateral scoots and one trial with moderate assistance     Balance Overall balance assessment: Needs assistance Sitting-balance support: Feet supported, Bilateral upper extremity supported Sitting balance-Leahy Scale: Fair                                     ADL either performed or assessed with clinical judgement   ADL Overall ADL's : Needs assistance/impaired Eating/Feeding: Set up;Sitting   Grooming: Minimal assistance;Cueing for safety;Cueing for sequencing;Bed level   Upper Body Bathing: Bed level;Maximal assistance   Lower Body Bathing: Bed level;Moderate assistance   Upper Body Dressing : Moderate assistance;Bed level   Lower Body Dressing: Maximal assistance;Bed level                      Extremity/Trunk Assessment Upper Extremity Assessment Upper Extremity Assessment: RUE deficits/detail;LUE deficits/detail RUE Deficits / Details: 3-/5 overall, difficulty moving against gravity, has ROM WFL RUE Sensation: decreased light touch RUE Coordination: decreased fine motor;decreased gross motor LUE Deficits / Details: 3-/5 overall, difficulty moving against gravity, has ROM WFL LUE Sensation: decreased light touch LUE Coordination: decreased fine motor;decreased gross motor   Lower Extremity Assessment Lower Extremity Assessment: Defer to PT  evaluation        Vision   Vision Assessment?: No apparent visual deficits   Perception Perception Perception: Within Functional Limits   Praxis Praxis Praxis: Intact    Cognition Arousal/Alertness: Awake/alert Behavior During Therapy: WFL for  tasks assessed/performed Overall Cognitive Status: Impaired/Different from baseline Area of Impairment: Awareness                   Current Attention Level: Sustained Memory: Decreased short-term memory                  Exercises      Shoulder Instructions       General Comments      Pertinent Vitals/ Pain       Pain Assessment Pain Assessment: Faces Faces Pain Scale: Hurts even more Facial Expression: Tense Body Movements: Absence of movements Muscle Tension: Relaxed Compliance with ventilator (intubated pts.): N/A Vocalization (extubated pts.): N/A CPOT Total: 1 Pain Location: Legs, bottom Pain Descriptors / Indicators: Discomfort, Grimacing, Guarding Pain Intervention(s): Limited activity within patient's tolerance, Repositioned  Home Living                                          Prior Functioning/Environment              Frequency  Min 2X/week        Progress Toward Goals  OT Goals(current goals can now be found in the care plan section)  Progress towards OT goals: Progressing toward goals  Acute Rehab OT Goals Patient Stated Goal: to be able to go home OT Goal Formulation: With patient Time For Goal Achievement: 06/11/21 Potential to Achieve Goals: Good ADL Goals Pt Will Perform Eating: with modified independence;with adaptive utensils;sitting Pt Will Perform Grooming: with modified independence;with adaptive equipment;sitting Pt Will Perform Upper Body Bathing: with min guard assist;sitting Pt Will Perform Lower Body Bathing: with min assist;sitting/lateral leans Pt Will Perform Upper Body Dressing: with min guard assist;sitting Pt Will Perform Lower Body Dressing: with min assist;sitting/lateral leans;with adaptive equipment Pt Will Transfer to Toilet: with min guard assist;stand pivot transfer Pt Will Perform Toileting - Clothing Manipulation and hygiene: with min assist;sitting/lateral leans Additional ADL  Goal #1: Pt to implement 3 energy conservation strategies during ADLs/mobility Additional ADL Goal #2: Pt to improve activityt olerance >5 min with stable vitals and decreased anxiety  Plan Discharge plan remains appropriate    Co-evaluation                 AM-PAC OT "6 Clicks" Daily Activity     Outcome Measure   Help from another person eating meals?: A Little Help from another person taking care of personal grooming?: A Little Help from another person toileting, which includes using toliet, bedpan, or urinal?: Total Help from another person bathing (including washing, rinsing, drying)?: A Lot Help from another person to put on and taking off regular upper body clothing?: A Lot Help from another person to put on and taking off regular lower body clothing?: A Lot 6 Click Score: 13    End of Session    OT Visit Diagnosis: Unsteadiness on feet (R26.81);Other abnormalities of gait and mobility (R26.89);Repeated falls (R29.6);Pain;Muscle weakness (generalized) (M62.81)   Activity Tolerance Patient limited by fatigue   Patient Left in bed;with call bell/phone within reach;with bed alarm set;with family/visitor present   Nurse Communication  Time: 7680-8811 OT Time Calculation (min): 24 min  Charges: OT General Charges $OT Visit: 1 Visit OT Treatments $Self Care/Home Management : 23-37 mins  Alphia Moh OTR/L  Acute Rehab Services  [phone] office number [phone] pager number   Alphia Moh 06/01/2021, 10:57 AM

## 2021-06-01 NOTE — TOC Progression Note (Signed)
Transition of Care [facility]) - Progression Note    Patient Details  Name: [name] MRN: [id number] Date of Birth: [birth year]  Transition of Care [facility]) CM/SW Contact  Tom-Johnson, Renea Ee, RN Phone Number: 06/01/2021, 3:56 PM  Clinical Narrative:     Patient's diet upgraded to Dys 3 per Speech Therapist. Still has Cortrak in place.  CM will continue to follow with needs.  Expected Discharge Plan: [place] Barriers to Discharge: Continued Medical Work up  Expected Discharge Plan and Services Expected Discharge Plan: [place]   Discharge Planning Services: CM Consult Post Acute Care Choice: [place] arrangements for the past 2 months: Single Family Home                           HH Arranged: PT, OT, RN (currently active) [place] Agency: [place] Date [facility] Agency Contacted: 05/28/21 Time [place]: 48 Representative spoke with at [place]: Tommi Rumps has been updated and made aware that family and patient wishes to resume services at d/c   Social Determinants of Health (SDOH) Interventions    Readmission Risk Interventions    05/28/2021    2:26 PM  Readmission Risk Prevention Plan  Transportation Screening Complete  PCP or Specialist Appt within 3-5 Days Complete  HRI or [place] Complete  Social Work Consult for [place] Planning/Counseling Complete  Palliative Care Screening Not Applicable  Medication Review Press photographer) Referral to Pharmacy

## 2021-06-01 NOTE — Progress Notes (Signed)
Nutrition Follow-up  DOCUMENTATION CODES:   Severe malnutrition in context of acute illness/injury  INTERVENTION:   Pt po intake improved somewhat yesterday but still not meeting nutritional needs by mouth  Continue Dysphagia 3 diet; cater to pt preferences  Continue Juven BID, each packet provides 80 calories, 8 grams of carbohydrate, 2.5  grams of protein (collagen), 7 grams of L-arginine and 7 grams of L-glutamine; supplement contains CaHMB, Vitamins C, E, B12 and Zinc to promote wound healing  D/C Boost Breeze as pt does not like; pt has tried all the supplements in house [employer], [employer], [employer]) and only likes the [employer]  Add MVI with Minerals daily; Vitamin C 500 mg BID x 30 days, Zinc Sulfate 220 mg x 14 days  Continue 30 ml ProSource Plus BID, each supplement provides 100 kcals and 15 grams protein.   Continue Nocturnal Tube Feeding via Cortrak:  Osmolite 1.5 at 70 ml/hr x 12 hours This provides 1260 kcals, 53 g of protein and 638 mL of free water  NUTRITION DIAGNOSIS:   Severe Malnutrition related to acute illness (sepsis, cellulitis causing poor appetite/intake) as evidenced by energy intake < or equal to 50% for > or equal to 5 days, severe fat depletion.  Being addressed via supplements, TF  GOAL:   Patient will meet greater than or equal to 90% of their needs  Progressing  MONITOR:   PO intake, Supplement acceptance, Labs, Skin  REASON FOR ASSESSMENT:   Rounds    ASSESSMENT:    Pt did not like her breakfast this morning so did not eat much. Pt reports she was eating better yesterday. Pt ate chicken noodle soup at both lunch and dinner and said this tasted good. Recorded po intake form yesterday was 10% at breakfast, 25% at lunch and 0% at dinner. Pt also ate orange sherbet and canned fruit at dinner at this tasted good as well. Pt did not try chicken salad yesterday, RD to add to lunch and dinner trays moving forward. Pt admits to being a very  picky eater.   Pt tried the [employer] but does not like; pt does however like the Juven which she took by mouth yesterday. Plan to continue Juven for wound healing  Tolerating nocturnal TF via Cortrak  Labs: reviewed Meds: ss novolog   Diet Order:   Diet Order             DIET DYS 3 Room service appropriate? Yes with Assist; Fluid consistency: Thin  Diet effective now                   EDUCATION NEEDS:   Not appropriate for education at this time  Skin:  Skin Assessment: Skin Integrity Issues: Skin Integrity Issues:: Stage III, Other (Comment) Stage III: sacrum Other: BLE cellulitis; R foot non-pressure wound; MASD to sacrum & coccyx  Last BM:  5/22  Height:   Ht Readings from Last 1 Encounters:  04/26/21 5\' 4"  (1.626 m)    Weight:   Wt Readings from Last 1 Encounters:  05/31/21 66.3 kg     BMI:  Body mass index is 25.09 kg/m.  Estimated Nutritional Needs:   Kcal:  1700-1900  Protein:  90-110 gm  Fluid:  1.7-1.9 L   Kerman Passey MS, RDN, LDN, CNSC Registered Dietitian III Clinical Nutrition RD Pager and On-Call Pager Number Located in [place]

## 2021-06-02 DIAGNOSIS — L03119 Cellulitis of unspecified part of limb: Secondary | ICD-10-CM | POA: Diagnosis not present

## 2021-06-02 DIAGNOSIS — L89159 Pressure ulcer of sacral region, unspecified stage: Secondary | ICD-10-CM

## 2021-06-02 DIAGNOSIS — A419 Sepsis, unspecified organism: Secondary | ICD-10-CM | POA: Diagnosis not present

## 2021-06-02 DIAGNOSIS — E43 Unspecified severe protein-calorie malnutrition: Secondary | ICD-10-CM | POA: Diagnosis not present

## 2021-06-02 LAB — CBC WITH DIFFERENTIAL/PLATELET
Abs Immature Granulocytes: 0.17 10*3/uL — ABNORMAL HIGH (ref 0.00–0.07)
Basophils Absolute: 0 10*3/uL (ref 0.0–0.1)
Basophils Relative: 0 %
Eosinophils Absolute: 0.3 10*3/uL (ref 0.0–0.5)
Eosinophils Relative: 3 %
HCT: 28.5 % — ABNORMAL LOW (ref 36.0–46.0)
Hemoglobin: 8.8 g/dL — ABNORMAL LOW (ref 12.0–15.0)
Immature Granulocytes: 2 %
Lymphocytes Relative: 6 %
Lymphs Abs: 0.6 10*3/uL — ABNORMAL LOW (ref 0.7–4.0)
MCH: 28.1 pg (ref 26.0–34.0)
MCHC: 30.9 g/dL (ref 30.0–36.0)
MCV: 91.1 fL (ref 80.0–100.0)
Monocytes Absolute: 0.9 10*3/uL (ref 0.1–1.0)
Monocytes Relative: 8 %
Neutro Abs: 8.7 10*3/uL — ABNORMAL HIGH (ref 1.7–7.7)
Neutrophils Relative %: 81 %
Platelets: 170 10*3/uL (ref 150–400)
RBC: 3.13 MIL/uL — ABNORMAL LOW (ref 3.87–5.11)
RDW: 16.5 % — ABNORMAL HIGH (ref 11.5–15.5)
WBC: 10.6 10*3/uL — ABNORMAL HIGH (ref 4.0–10.5)
nRBC: 0 % (ref 0.0–0.2)

## 2021-06-02 LAB — GLUCOSE, CAPILLARY
Glucose-Capillary: 100 mg/dL — ABNORMAL HIGH (ref 70–99)
Glucose-Capillary: 109 mg/dL — ABNORMAL HIGH (ref 70–99)
Glucose-Capillary: 112 mg/dL — ABNORMAL HIGH (ref 70–99)
Glucose-Capillary: 115 mg/dL — ABNORMAL HIGH (ref 70–99)
Glucose-Capillary: 123 mg/dL — ABNORMAL HIGH (ref 70–99)
Glucose-Capillary: 87 mg/dL (ref 70–99)

## 2021-06-02 MED ORDER — GUAIFENESIN ER 600 MG PO TB12
1200.0000 mg | ORAL_TABLET | Freq: Two times a day (BID) | ORAL | Status: DC
  Administered 2021-06-02 – 2021-06-03 (×3): 1200 mg via ORAL
  Filled 2021-06-02 (×3): qty 2

## 2021-06-02 NOTE — Consult Note (Signed)
WOC Nurse wound follow up Reconsulted regarding leg and buttocks wounds. Please see previous consult note.  Orders in place.  Anticipate discharge tomorrow.   Wound type: infectious, resolving Pressure, sacrum, chronic nonhealing in the setting of malnutrition and MS Measurement: see previous assessment.   Dressing procedure/placement/frequency: COntinue current orders of Xeroform gauze to legs and sacral foam and offloading to sacrum.  Will not follow at this time.  Please re-consult if needed.  Maple Hudson MSN, RN, FNP-BC CWON Wound, Ostomy, Continence Nurse Pager [phone]

## 2021-06-02 NOTE — Progress Notes (Signed)
Physical Therapy Treatment Patient Details Name: [name] MRN: [id number] DOB: Aug 02, 1960 Today's Date: 06/02/2021   History of Present Illness Pt adm on 5/17 due to worsening cellulitis in RLE and concerns for Septic Shock. Recently hospitalized on 4/17 with septic shock due to RLE cellulitis and PNA. Pt developed acute hypoxic respiratory failure and placed on bipap 4/19. Developed iatrogenic pneumothorax and chest tube placed. Discharged 5/9. PMH - Multiple Sclerosis, seizure, ankylosing spondylitis, fibromyalgia    PT Comments    Session limited by frequent liquid stools. Patient participated in multiple bouts of rolling with min-modA to perform pericare. Unable to progress to EOB or OOB this session due to frequency of BM. Patient expressing pain during pericare due to number of times she has been cleaned up this date. Notified RN and NT at end of session. Will attempt family training and OOB mobility next session if BMs have improved. D/c plan remains appropriate.    Recommendations for follow up therapy are one component of a multi-disciplinary discharge planning process, led by the attending physician.  Recommendations may be updated based on patient status, additional functional criteria and insurance authorization.  Follow Up Recommendations  Home health PT     Assistance Recommended at Discharge Frequent or constant Supervision/Assistance  Patient can return home with the following A lot of help with bathing/dressing/bathroom;Assistance with cooking/housework;Direct supervision/assist for medications management;Direct supervision/assist for financial management;Assist for transportation;Help with stairs or ramp for entrance;A lot of help with walking and/or transfers   Equipment Recommendations  None recommended by PT    Recommendations for Other Services       Precautions / Restrictions Precautions Precautions: Fall Precaution Comments: cortrak Restrictions Weight  Bearing Restrictions: No     Mobility  Bed Mobility Overal bed mobility: Needs Assistance Bed Mobility: Rolling Rolling: Min assist, Mod assist         General bed mobility comments: rolling multiple times in session with min-mod A, requiring tactile cues and assist to roll over fully, patient with noted difficulty rolling (more than likely due to fatigue) as patient has had to be cleaned up multiple times to date    Transfers                   General transfer comment: unable to complete due to frequent loose stools.    Ambulation/Gait                   Stairs             Wheelchair Mobility    Modified Rankin (Stroke Patients Only)       Balance                                            Cognition Arousal/Alertness: Awake/alert Behavior During Therapy: WFL for tasks assessed/performed Overall Cognitive Status: Impaired/Different from baseline Area of Impairment: Following commands, Awareness, Problem solving, Attention, Memory                   Current Attention Level: Sustained Memory: Decreased short-term memory Following Commands: Follows one step commands consistently   Awareness: Emergent Problem Solving: Slow processing, Requires verbal cues, Requires tactile cues General Comments: patient labile in session, frustrated that she keeps having BMs, requiring tactile cues and step by step directions to be cleaned up despite having completed multiple bouts of rolling  Exercises      General Comments        Pertinent Vitals/Pain Pain Assessment Pain Assessment: Faces Faces Pain Scale: Hurts even more Pain Location: Legs, bottom Pain Descriptors / Indicators: Discomfort, Grimacing, Guarding Pain Intervention(s): Monitored during session    Home Living                          Prior Function            PT Goals (current goals can now be found in the care plan section) Acute Rehab  PT Goals Patient Stated Goal: to continue HHPT PT Goal Formulation: With patient Time For Goal Achievement: 06/09/21 Potential to Achieve Goals: Fair Progress towards PT goals: Not progressing toward goals - comment (limited by frequent loose stools)    Frequency    Min 3X/week      PT Plan Current plan remains appropriate    Co-evaluation PT/OT/SLP Co-Evaluation/Treatment: Yes Reason for Co-Treatment: Complexity of the patient's impairments (multi-system involvement);For patient/therapist safety;To address functional/ADL transfers PT goals addressed during session: Mobility/safety with mobility OT goals addressed during session: ADL's and self-care      AM-PAC PT "6 Clicks" Mobility   Outcome Measure  Help needed turning from your back to your side while in a flat bed without using bedrails?: A Little Help needed moving from lying on your back to sitting on the side of a flat bed without using bedrails?: A Little Help needed moving to and from a bed to a chair (including a wheelchair)?: A Lot Help needed standing up from a chair using your arms (e.g., wheelchair or bedside chair)?: A Lot Help needed to walk in hospital room?: Total Help needed climbing 3-5 steps with a railing? : Total 6 Click Score: 12    End of Session   Activity Tolerance: Patient tolerated treatment well Patient left: in bed;with call bell/phone within reach;with family/visitor present;with bed alarm set Nurse Communication: Mobility status PT Visit Diagnosis: Other abnormalities of gait and mobility (R26.89);Muscle weakness (generalized) (M62.81) Pain - Right/Left: Left Pain - part of body: Leg     Time: 5625-6389 PT Time Calculation (min) (ACUTE ONLY): 24 min  Charges:  $Therapeutic Activity: 8-22 mins                     Kewana Sanon A. Dan Humphreys PT, DPT Acute Rehabilitation Services Pager [phone] Office [phone]    Viviann Spare 06/02/2021, 5:03 PM

## 2021-06-02 NOTE — Progress Notes (Signed)
PROGRESS NOTE    [name]  ZOX:[id number] DOB: March 31, 1960 DOA: 05/25/2021 PCP: [facility]   Brief Narrative: [name] is a 61 y.o. female with PMH significant for multiple sclerosis, ankylosing spondylitis (walks with a walker), fibromyalgia, seizure disorder, GERD.    Patient was started on ocrelizumab back in Feb after about 4 weeks of treatment, patient started having increased swelling, scaling and redness of right lower extremity.  She was subsequently admitted 4/17-5/9 for right lower extremity cellulitis, aspiration pneumonia, bilateral pneumothoraces s/p chest tube placements and ultimately discharged home with home health.   5/16, patient was sent to the ED by his wound nurse for continuous worsening of lower extremity wound.    In the ED, her blood pressure dipped down to 60s requiring fluid boluses and pressors. She was admitted to ICU and remained    Assessment and Plan:  Septic shock Secondary to LE cellulitis. Patient required ICU admission and vasopressor (Levophed) support. Patient managed on antibiotics with improvement of shock. Vasopressor discontinued after 3 days. Blood cultures with no growth. Resolved.  Lower extremity cellulitis Patient treated empirically with vancomycin/cefepime and was transitioned to Linezolid/Unasyn. ID consulted with recommendations to discontinue antibiotic treatment as she completed 7 days of treatment. Wound care consulted with recommendations for Xeroform gauze to legs.  Refeeding syndrome Patient with hypokalemia, hypomagnesemia and hypophosphatemia initially after starting tube feeds. Electrolytes repleted. Now resolved.  Severe malnutrition Tube feeds currently. Dietitian recommending nocturnal tube feeds to encourage daytime oral intake. Patient cleared for a dysphagia 3 diet.  Diarrhea Likely related to tube feeds. Associated incontinence. Afebrile. No abdominal pain. Minimally elevated WBC.   -Imodium prn -Flexiseal  GERD -Continue Protonix  Normocytic anemia Stable.  Seizure disorder -Continue Krppra  Diarrhea Likely related to tube feeds. -Imodium PRN  Multiple sclerosis Ambulates with a walker at baseline. -Continue gabapentin  Anxiety -Continue hydroxyzine PRN  Pressure injury Mid/lower sacrum, POA. Wound care recommending sacral foam.   DVT prophylaxis: Heparin subq Code Status:   Code Status: Full Code Family Communication: Husband at bedside Disposition Plan: Discharge likely home once able to remove NG tube and patient is tolerating oral diet.   Consultants:  PCCM Infectious disease  Procedures:    Antimicrobials: Vancomycin Cefepime Linezolid Unasyn    Subjective: Patient continues to have diarrhea. No other issues.  Objective: BP 118/63   Pulse 93   Temp 98.1 F (36.7 C) (Oral)   Resp 20   Wt 66.8 kg   SpO2 97%   BMI 25.28 kg/m   Examination:  General exam: Appears calm and comfortable. NG tube in place. Respiratory system: Clear to auscultation. Respiratory effort normal. Cardiovascular system: S1 & S2 heard, RRR. No murmurs, rubs, gallops or clicks. Gastrointestinal system: Abdomen is nondistended, soft and nontender. Normal bowel sounds heard. Central nervous system: Alert and oriented. No focal neurological deficits. Musculoskeletal: No edema. No calf tenderness Skin: No cyanosis. No rashes Psychiatry: Judgement and insight appear normal. Mood & affect appropriate.    Data Reviewed: I have personally reviewed following labs and imaging studies  CBC Lab Results  Component Value Date   WBC 10.6 (H) 06/02/2021   RBC 3.13 (L) 06/02/2021   HGB 8.8 (L) 06/02/2021   HCT 28.5 (L) 06/02/2021   MCV 91.1 06/02/2021   MCH 28.1 06/02/2021   PLT 170 06/02/2021   MCHC 30.9 06/02/2021   RDW 16.5 (H) 06/02/2021   LYMPHSABS 0.6 (L) 06/02/2021   MONOABS 0.9 06/02/2021   EOSABS  0.3 06/02/2021   BASOSABS 0.0 06/02/2021      Last metabolic panel Lab Results  Component Value Date   NA 140 05/31/2021   K 4.5 05/31/2021   CL 110 05/31/2021   CO2 23 05/31/2021   BUN 25 (H) 05/31/2021   CREATININE 0.47 05/31/2021   GLUCOSE 100 (H) 05/31/2021   GFRNONAA >60 05/31/2021   GFRAA >90 12/29/2013   CALCIUM 7.9 (L) 05/31/2021   PHOS 3.2 05/31/2021   PROT 5.1 (L) 05/25/2021   ALBUMIN 2.5 (L) 05/26/2021   BILITOT 0.8 05/25/2021   ALKPHOS 80 05/25/2021   AST 12 (L) 05/25/2021   ALT 13 05/25/2021   ANIONGAP 7 05/31/2021    GFR: Estimated Creatinine Clearance: 67.6 mL/min (by C-G formula based on SCr of 0.47 mg/dL).  Recent Results (from the past 240 hour(s))  Blood culture (routine x 2)     Status: None   Collection Time: 05/25/21  4:08 PM   Specimen: BLOOD  Result Value Ref Range Status   Specimen Description BLOOD BLOOD RIGHT FOREARM  Final   Special Requests   Final    BOTTLES DRAWN AEROBIC AND ANAEROBIC Blood Culture adequate volume   Culture   Final    NO GROWTH 5 DAYS Performed at [facility], [address], [place], [place] [address]    Report Status 05/30/2021 FINAL  Final  Blood culture (routine x 2)     Status: None   Collection Time: 05/25/21  4:20 PM   Specimen: BLOOD RIGHT ARM  Result Value Ref Range Status   Specimen Description BLOOD RIGHT ARM  Final   Special Requests   Final    BOTTLES DRAWN AEROBIC AND ANAEROBIC Blood Culture adequate volume   Culture   Final    NO GROWTH 5 DAYS Performed at [facility], [address] [place], [place] [address]    Report Status 05/30/2021 FINAL  Final  Resp Panel by RT-PCR (Flu A&B, Covid) Nasal Mucosa     Status: None   Collection Time: 05/26/21  4:31 AM   Specimen: Nasal Mucosa; Nasopharyngeal(NP) swabs in vial transport medium  Result Value Ref Range Status   SARS Coronavirus 2 by RT PCR NEGATIVE NEGATIVE Final    Comment: (NOTE) SARS-CoV-2 target nucleic acids are NOT DETECTED.  The SARS-CoV-2 RNA is generally  detectable in upper respiratory specimens during the acute phase of infection. The lowest concentration of SARS-CoV-2 viral copies this assay can detect is 138 copies/mL. A negative result does not preclude SARS-Cov-2 infection and should not be used as the sole basis for treatment or other patient management decisions. A negative result may occur with  improper specimen collection/handling, submission of specimen other than nasopharyngeal swab, presence of viral mutation(s) within the areas targeted by this assay, and inadequate number of viral copies(<138 copies/mL). A negative result must be combined with clinical observations, patient history, and epidemiological information. The expected result is Negative.  Fact Sheet for Patients:  BloggerCourse.com  Fact Sheet for Healthcare Providers:  SeriousBroker.it  This test is no t yet approved or cleared by the [place] FDA and  has been authorized for detection and/or diagnosis of SARS-CoV-2 by FDA under an Emergency Use Authorization (EUA). This EUA will remain  in effect (meaning this test can be used) for the duration of the COVID-19 declaration under Section 564(b)(1) of the Act, 21 U.S.C.section 360bbb-3(b)(1), unless the authorization is terminated  or revoked sooner.       Influenza  A by PCR NEGATIVE NEGATIVE Final   Influenza B by PCR NEGATIVE NEGATIVE Final    Comment: (NOTE) The Xpert Xpress SARS-CoV-2/FLU/RSV plus assay is intended as an aid in the diagnosis of influenza from Nasopharyngeal swab specimens and should not be used as a sole basis for treatment. Nasal washings and aspirates are unacceptable for Xpert Xpress SARS-CoV-2/FLU/RSV testing.  Fact Sheet for Patients: BloggerCourse.com  Fact Sheet for Healthcare Providers: SeriousBroker.it  This test is not yet approved or cleared by the [place] FDA  and has been authorized for detection and/or diagnosis of SARS-CoV-2 by FDA under an Emergency Use Authorization (EUA). This EUA will remain in effect (meaning this test can be used) for the duration of the COVID-19 declaration under Section 564(b)(1) of the Act, 21 U.S.C. section 360bbb-3(b)(1), unless the authorization is terminated or revoked.  Performed at [facility], [address] [place], [place] [address]   MRSA Next Gen by PCR, Nasal     Status: None   Collection Time: 05/26/21  4:31 AM   Specimen: Nasal Mucosa; Nasal Swab  Result Value Ref Range Status   MRSA by PCR Next Gen NOT DETECTED NOT DETECTED Final    Comment: (NOTE) The GeneXpert MRSA Assay (FDA approved for NASAL specimens only), is one component of a comprehensive MRSA colonization surveillance program. It is not intended to diagnose MRSA infection nor to guide or monitor treatment for MRSA infections. Test performance is not FDA approved in patients less than 70 years old. Performed at [facility], [address] [place], [place] [address]       Radiology Studies: No results found.    LOS: 7 days    Jacquelin Hawking, MD [facility] 06/02/2021, 3:45 PM   If 7PM-7AM, please contact night-coverage [email or web address]

## 2021-06-02 NOTE — Plan of Care (Signed)
  Problem: Health Behavior/Discharge Planning: Goal: Ability to manage health-related needs will improve Outcome: Progressing   

## 2021-06-02 NOTE — Progress Notes (Signed)
Occupational Therapy Treatment Patient Details Name: [name] MRN: [id number] DOB: 1960-05-20 Today's Date: 06/02/2021   History of present illness Pt adm on 5/17 due to worsening cellulitis in RLE and concerns for Septic Shock. Recently hospitalized on 4/17 with septic shock due to RLE cellulitis and PNA. Pt developed acute hypoxic respiratory failure and placed on bipap 4/19. Developed iatrogenic pneumothorax and chest tube placed. Discharged 5/9. PMH - Multiple Sclerosis, seizure, ankylosing spondylitis, fibromyalgia   OT comments  Patient continues to make incremental progress towards goals in skilled OT session. Patient's session encompassed bed level mobility due to frequent loose stools in session. Session limited due to numerous liquid BMs requiring patient to be cleaned up, unable to complete family training or sit up EOB due to frequent stools. Patient rolling multiple times in session with min-mod A, requiring tactile cues and assist to roll over fully, patient with noted difficulty rolling (more than likely due to fatigue) as patient has had to be cleaned up multiple times to date. OT will continue to follow acutely.    Recommendations for follow up therapy are one component of a multi-disciplinary discharge planning process, led by the attending physician.  Recommendations may be updated based on patient status, additional functional criteria and insurance authorization.    Follow Up Recommendations  Home health OT (Pt at this time declining SNF therapies at this time)    Assistance Recommended at Discharge Frequent or constant Supervision/Assistance  Patient can return home with the following  A lot of help with walking and/or transfers;A lot of help with bathing/dressing/bathroom;Assistance with cooking/housework;Assist for transportation   Equipment Recommendations  Wheelchair (measurements OT);Wheelchair cushion (measurements OT)    Recommendations for Other Services       Precautions / Restrictions Precautions Precautions: Fall Precaution Comments: core trak Restrictions Weight Bearing Restrictions: No       Mobility Bed Mobility Overal bed mobility: Needs Assistance Bed Mobility: Rolling Rolling: Min assist, Mod assist         General bed mobility comments: rolling multiple times in session with min-mod A, requiring tactile cues and assist to roll over fully, patient with noted difficulty rolling (more than likely due to fatigue) as patient has had to be cleaned up multiple times to date    Transfers                   General transfer comment: unable to complete due to frequent stools.     Balance                                           ADL either performed or assessed with clinical judgement   ADL Overall ADL's : Needs assistance/impaired                           Toilet Transfer Details (indicate cue type and reason): mulitple bouts of BMs in bed Toileting- Clothing Manipulation and Hygiene: Total assistance;Cueing for sequencing;Cueing for compensatory techniques       Functional mobility during ADLs: Maximal assistance General ADL Comments: session limited due to numerous liquid BMs requiring patient to be cleaned up, unable to complete family training or sit up EOB due to frequent stools.    Extremity/Trunk Assessment              Vision  Perception     Praxis      Cognition Arousal/Alertness: Awake/alert Behavior During Therapy: WFL for tasks assessed/performed Overall Cognitive Status: Impaired/Different from baseline Area of Impairment: Following commands, Awareness, Problem solving, Attention, Memory                   Current Attention Level: Sustained Memory: Decreased short-term memory Following Commands: Follows one step commands consistently   Awareness: Emergent Problem Solving: Slow processing, Requires verbal cues, Requires tactile  cues General Comments: patient labile in session, frustrated that she keeps having BMs, requiring tactile cues and step by step directions to be cleaned up despite having completed multiple bouts of rolling        Exercises      Shoulder Instructions       General Comments      Pertinent Vitals/ Pain       Pain Assessment Pain Assessment: Faces Faces Pain Scale: Hurts even more Pain Location: Legs, bottom Pain Descriptors / Indicators: Discomfort, Grimacing, Guarding Pain Intervention(s): Limited activity within patient's tolerance, Monitored during session, Repositioned  Home Living                                          Prior Functioning/Environment              Frequency  Min 2X/week        Progress Toward Goals  OT Goals(current goals can now be found in the care plan section)  Progress towards OT goals: Progressing toward goals  Acute Rehab OT Goals Patient Stated Goal: see wound care OT Goal Formulation: With patient Time For Goal Achievement: 06/11/21 Potential to Achieve Goals: [place] Discharge plan remains appropriate    Co-evaluation      Reason for Co-Treatment: Complexity of the patient's impairments (multi-system involvement);To address functional/ADL transfers;For patient/therapist safety   OT goals addressed during session: ADL's and self-care      AM-PAC OT "6 Clicks" Daily Activity     Outcome Measure   Help from another person eating meals?: A Little Help from another person taking care of personal grooming?: A Little Help from another person toileting, which includes using toliet, bedpan, or urinal?: Total Help from another person bathing (including washing, rinsing, drying)?: A Lot Help from another person to put on and taking off regular upper body clothing?: A Lot Help from another person to put on and taking off regular lower body clothing?: A Lot 6 Click Score: 13    End of Session    OT Visit  Diagnosis: Unsteadiness on feet (R26.81);Other abnormalities of gait and mobility (R26.89);Repeated falls (R29.6);Pain;Muscle weakness (generalized) (M62.81)   Activity Tolerance Patient limited by fatigue   Patient Left in bed;with call bell/phone within reach;with bed alarm set   Nurse Communication Mobility status;Other (comment) (Need for a flexi-seal)        Time: [id number] OT Time Calculation (min): 24 min  Charges: OT General Charges $OT Visit: 1 Visit OT Treatments $Self Care/Home Management : 8-22 mins  Corinne Ports E. [place] Haub, OTR/L Acute Rehabilitation Services [phone] [place] 06/02/2021, 4:00 PM

## 2021-06-03 DIAGNOSIS — R6521 Severe sepsis with septic shock: Secondary | ICD-10-CM | POA: Diagnosis not present

## 2021-06-03 DIAGNOSIS — A419 Sepsis, unspecified organism: Secondary | ICD-10-CM | POA: Diagnosis not present

## 2021-06-03 LAB — GLUCOSE, CAPILLARY
Glucose-Capillary: 104 mg/dL — ABNORMAL HIGH (ref 70–99)
Glucose-Capillary: 113 mg/dL — ABNORMAL HIGH (ref 70–99)
Glucose-Capillary: 133 mg/dL — ABNORMAL HIGH (ref 70–99)
Glucose-Capillary: 89 mg/dL (ref 70–99)

## 2021-06-03 MED ORDER — ACETAMINOPHEN 325 MG PO TABS: 650.0000 mg | ORAL_TABLET | ORAL | Status: DC | PRN

## 2021-06-03 MED ORDER — JUVEN PO PACK
1.0000 | PACK | Freq: Two times a day (BID) | ORAL | 0 refills | Status: AC
Start: 2021-06-04 — End: 2021-07-04

## 2021-06-03 MED ORDER — ZINC SULFATE 220 (50 ZN) MG PO CAPS
220.0000 mg | ORAL_CAPSULE | Freq: Every day | ORAL | 0 refills | Status: AC
Start: 2021-06-04 — End: 2021-07-04

## 2021-06-03 MED ORDER — OXYCODONE HCL 5 MG PO TABS: 5.0000 mg | ORAL_TABLET | ORAL | Status: DC | PRN

## 2021-06-03 MED ORDER — ASCORBIC ACID 500 MG PO TABS: 500.0000 mg | ORAL_TABLET | Freq: Two times a day (BID) | ORAL | 0 refills | Status: AC

## 2021-06-03 MED ORDER — HYDROCERIN EX CREA
TOPICAL_CREAM | Freq: Every day | CUTANEOUS | Status: DC
  Filled 2021-06-03: qty 113

## 2021-06-03 MED ORDER — MIDODRINE HCL 5 MG PO TABS
5.0000 mg | ORAL_TABLET | Freq: Three times a day (TID) | ORAL | Status: DC
  Administered 2021-06-03: 5 mg via ORAL
  Filled 2021-06-03: qty 1

## 2021-06-03 MED ORDER — MELATONIN 3 MG PO TABS: 3.0000 mg | ORAL_TABLET | Freq: Every evening | ORAL | Status: DC | PRN

## 2021-06-03 MED ORDER — ACETAMINOPHEN 325 MG PO TABS
325.0000 mg | ORAL_TABLET | Freq: Once | ORAL | Status: AC
  Administered 2021-06-03: 325 mg via ORAL
  Filled 2021-06-03: qty 1

## 2021-06-03 MED ORDER — MIDODRINE HCL 5 MG PO TABS
5.0000 mg | ORAL_TABLET | Freq: Three times a day (TID) | ORAL | 0 refills | Status: AC
Start: 2021-06-03 — End: 2021-07-03

## 2021-06-03 NOTE — Progress Notes (Signed)
DISCHARGE NOTE HOME [name] to be discharged Home per MD order. Discussed prescriptions and follow up appointments with the patient. Prescriptions given to patient; medication list explained in detail. Patient verbalized understanding.  Skin clean, dry and intact without evidence of skin break down, no evidence of skin tears noted. IV catheter discontinued intact. Site without signs and symptoms of complications. Dressing and pressure applied. Pt denies pain at the site currently. No complaints noted.  Patient free of lines, drains, and wounds.   An After Visit Summary (AVS) was printed and given to the patient. Patient escorted via stretcher, and discharged home via PTAR.  Daneil Dan, RN

## 2021-06-03 NOTE — Consult Note (Addendum)
WOC Nurse Re-consult Note: Reason for Consult: Requested to re-consult for sacrum and bilat legs.  Initial WOC consult performed 5/17. No further edema, weeping, or open wounds. Pt states skin is very tight, dry, and itchy. Wound type: Left  anterior and posterior calf with loose peeling skin and dry scabs which remove easily, revealing pink dry healed skin.  Right calf with dry peeling skin to anterior and posterior calf and anterior foot which is more tightly adhered.  Sacrum with pink dry scar tissue and a Stage 2 pressure injury in the center; .2X.2X.1cm, pink and moist.  Pressure Injury POA: Yes, according to wound flow-sheet Dressing procedure/placement/frequency: Topical treatment orders provided for bedside nurses to perform as follows to promote moist healing: Bathe patient's legs Q day and roughly towel dry to assist with removal of loose dry skin. Then apply Eucerin cream to bilat legs and feet. Foam dressing to sacrum, change Q 3 days or PRN soiling Please re-consult if further assistance is needed.  Thank-you,  Julien Girt MSN, [place], [place], [place], [place]

## 2021-06-03 NOTE — Progress Notes (Signed)
PROGRESS NOTE    [name]  VPX:[id number] DOB: 1960/03/06 DOA: 05/25/2021 PCP: [facility]   Brief Narrative: [name] is a 61 y.o. female with PMH significant for multiple sclerosis, ankylosing spondylitis (walks with a walker), fibromyalgia, seizure disorder, GERD.    Patient was started on ocrelizumab back in Feb after about 4 weeks of treatment, patient started having increased swelling, scaling and redness of right lower extremity.  She was subsequently admitted 4/17-5/9 for right lower extremity cellulitis, aspiration pneumonia, bilateral pneumothoraces s/p chest tube placements and ultimately discharged home with home health.   5/16, patient was sent to the ED by his wound nurse for continuous worsening of lower extremity wound.    In the ED, her blood pressure dipped down to 60s requiring fluid boluses and pressors. She was admitted to ICU and remained    Assessment and Plan:  Septic shock, resolved Secondary to LE cellulitis. Patient required ICU admission and vasopressor (Levophed) support. Patient managed on antibiotics with improvement of shock. Vasopressor discontinued after 3 days. Blood cultures with no growth. Resolved.  Lower extremity cellulitis, resolved Patient treated empirically with vancomycin/cefepime and was transitioned to Linezolid/Unasyn. ID consulted with recommendations to discontinue antibiotic treatment as she completed 7 days of treatment. Wound care consulted with recommendations for Xeroform gauze to legs.  Lower extremity dermatitis Chronic. Acute infection is resolved. Patient will need continued wound care and good nutritional support.  Refeeding syndrome Patient with hypokalemia, hypomagnesemia and hypophosphatemia initially after starting tube feeds. Electrolytes repleted. Now resolved.  Severe malnutrition Tube feeds currently. Dietitian recommending nocturnal tube feeds to encourage daytime oral intake.  Patient cleared for a dysphagia 3 diet. -Continue Dysphagia 3 diet and tube feeds per dietitian recommendations -SLP recommendations for consideration of advancing diet  Diarrhea Likely related to tube feeds. Associated incontinence. Afebrile. No abdominal pain. Minimally elevated WBC.  -Imodium prn -Flexiseal  GERD -Continue Protonix  Normocytic anemia Stable.  Seizure disorder -Continue Krppra  Diarrhea Likely related to tube feeds. -Imodium PRN  Multiple sclerosis Ambulates with a walker at baseline. -Continue gabapentin  Anxiety -Continue hydroxyzine PRN  Pressure injury Mid/lower sacrum, POA. Wound care recommending sacral foam.   DVT prophylaxis: Heparin subq Code Status:   Code Status: Full Code Family Communication: Daughter at bedside Disposition Plan: Discharge likely home once able to remove NG tube and patient is tolerating oral diet.   Consultants:  PCCM Infectious disease  Procedures:    Antimicrobials: Vancomycin Cefepime Linezolid Unasyn    Subjective: Patient with many concerns mostly revolving around her being in the hospital for as long as she has been. She is having some trouble with sleep at night. Flexiseal placed yesterday for diarrhea.  Objective: BP (!) 97/49 (BP Location: Right Arm)   Pulse 82   Temp 98.1 F (36.7 C)   Resp 17   Wt 59.5 kg   SpO2 100%   BMI 22.52 kg/m   Examination:  General exam: Appears calm and comfortable Respiratory system: Respiratory effort normal. Gastrointestinal system: Abdomen is non-distended Central nervous system: Alert and oriented. Skin: Bilateral LE in gauze wraps Psychiatry: Judgement and insight appear normal. Mood & affect appropriate.    Data Reviewed: I have personally reviewed following labs and imaging studies  CBC Lab Results  Component Value Date   WBC 10.6 (H) 06/02/2021   RBC 3.13 (L) 06/02/2021   HGB 8.8 (L) 06/02/2021   HCT 28.5 (L) 06/02/2021   MCV 91.1  06/02/2021   MCH  28.1 06/02/2021   PLT 170 06/02/2021   MCHC 30.9 06/02/2021   RDW 16.5 (H) 06/02/2021   LYMPHSABS 0.6 (L) 06/02/2021   MONOABS 0.9 06/02/2021   EOSABS 0.3 06/02/2021   BASOSABS 0.0 06/02/2021     Last metabolic panel Lab Results  Component Value Date   NA 140 05/31/2021   K 4.5 05/31/2021   CL 110 05/31/2021   CO2 23 05/31/2021   BUN 25 (H) 05/31/2021   CREATININE 0.47 05/31/2021   GLUCOSE 100 (H) 05/31/2021   GFRNONAA >60 05/31/2021   GFRAA >90 12/29/2013   CALCIUM 7.9 (L) 05/31/2021   PHOS 3.2 05/31/2021   PROT 5.1 (L) 05/25/2021   ALBUMIN 2.5 (L) 05/26/2021   BILITOT 0.8 05/25/2021   ALKPHOS 80 05/25/2021   AST 12 (L) 05/25/2021   ALT 13 05/25/2021   ANIONGAP 7 05/31/2021    GFR: Estimated Creatinine Clearance: 62.2 mL/min (by C-G formula based on SCr of 0.47 mg/dL).  Recent Results (from the past 240 hour(s))  Blood culture (routine x 2)     Status: None   Collection Time: 05/25/21  4:08 PM   Specimen: BLOOD  Result Value Ref Range Status   Specimen Description BLOOD BLOOD RIGHT FOREARM  Final   Special Requests   Final    BOTTLES DRAWN AEROBIC AND ANAEROBIC Blood Culture adequate volume   Culture   Final    NO GROWTH 5 DAYS Performed at [facility], [address] [place], [place] [address]    Report Status 05/30/2021 FINAL  Final  Blood culture (routine x 2)     Status: None   Collection Time: 05/25/21  4:20 PM   Specimen: BLOOD RIGHT ARM  Result Value Ref Range Status   Specimen Description BLOOD RIGHT ARM  Final   Special Requests   Final    BOTTLES DRAWN AEROBIC AND ANAEROBIC Blood Culture adequate volume   Culture   Final    NO GROWTH 5 DAYS Performed at [facility], [address] [place], [place] [address]    Report Status 05/30/2021 FINAL  Final  Resp Panel by RT-PCR (Flu A&B, Covid) Nasal Mucosa     Status: None   Collection Time: 05/26/21  4:31 AM   Specimen: Nasal Mucosa; Nasopharyngeal(NP) swabs in vial  transport medium  Result Value Ref Range Status   SARS Coronavirus 2 by RT PCR NEGATIVE NEGATIVE Final    Comment: (NOTE) SARS-CoV-2 target nucleic acids are NOT DETECTED.  The SARS-CoV-2 RNA is generally detectable in upper respiratory specimens during the acute phase of infection. The lowest concentration of SARS-CoV-2 viral copies this assay can detect is 138 copies/mL. A negative result does not preclude SARS-Cov-2 infection and should not be used as the sole basis for treatment or other patient management decisions. A negative result may occur with  improper specimen collection/handling, submission of specimen other than nasopharyngeal swab, presence of viral mutation(s) within the areas targeted by this assay, and inadequate number of viral copies(<138 copies/mL). A negative result must be combined with clinical observations, patient history, and epidemiological information. The expected result is Negative.  Fact Sheet for Patients:  BloggerCourse.com  Fact Sheet for Healthcare Providers:  SeriousBroker.it  This test is no t yet approved or cleared by the [place] FDA and  has been authorized for detection and/or diagnosis of SARS-CoV-2 by FDA under an Emergency Use Authorization (EUA). This EUA will remain  in effect (meaning this test can be used) for  the duration of the COVID-19 declaration under Section 564(b)(1) of the Act, 21 U.S.C.section 360bbb-3(b)(1), unless the authorization is terminated  or revoked sooner.       Influenza A by PCR NEGATIVE NEGATIVE Final   Influenza B by PCR NEGATIVE NEGATIVE Final    Comment: (NOTE) The Xpert Xpress SARS-CoV-2/FLU/RSV plus assay is intended as an aid in the diagnosis of influenza from Nasopharyngeal swab specimens and should not be used as a sole basis for treatment. Nasal washings and aspirates are unacceptable for Xpert Xpress SARS-CoV-2/FLU/RSV testing.  Fact  Sheet for Patients: BloggerCourse.com  Fact Sheet for Healthcare Providers: SeriousBroker.it  This test is not yet approved or cleared by the [place] FDA and has been authorized for detection and/or diagnosis of SARS-CoV-2 by FDA under an Emergency Use Authorization (EUA). This EUA will remain in effect (meaning this test can be used) for the duration of the COVID-19 declaration under Section 564(b)(1) of the Act, 21 U.S.C. section 360bbb-3(b)(1), unless the authorization is terminated or revoked.  Performed at [facility], [address], [place], [place] [address]   MRSA Next Gen by PCR, Nasal     Status: None   Collection Time: 05/26/21  4:31 AM   Specimen: Nasal Mucosa; Nasal Swab  Result Value Ref Range Status   MRSA by PCR Next Gen NOT DETECTED NOT DETECTED Final    Comment: (NOTE) The GeneXpert MRSA Assay (FDA approved for NASAL specimens only), is one component of a comprehensive MRSA colonization surveillance program. It is not intended to diagnose MRSA infection nor to guide or monitor treatment for MRSA infections. Test performance is not FDA approved in patients less than 35 years old. Performed at [facility], [address], [place], [place] [address]       Radiology Studies: No results found.    LOS: 8 days    Jacquelin Hawking, MD [facility] 06/03/2021, 11:06 AM   If 7PM-7AM, please contact night-coverage [email or web address]

## 2021-06-03 NOTE — Discharge Summary (Addendum)
Physician Discharge Summary   Patient: [name] MRN: [id number] DOB: [birth year]  Admit date:     05/25/2021  Discharge date: 06/03/21  Discharge Physician: Cordelia Poche, MD   PCP: [facility]   Recommendations at discharge:  Hospital follow-up with PCP in 1 week Ensure improvement of nutrition intake Wean midodrine  Discharge Diagnoses: Principal Problem:   Sepsis ([place]) Active Problems:   Cellulitis of lower extremity   Ulcer, pressure   Protein-calorie malnutrition, severe  Resolved Problems:   * No resolved hospital problems. *  Admission HPI: 61 yo woman with multiple sclerosis (ambulatory with walker), Anklyosing spondylitis, GERD, Seizure disorder, fibromyalgia, recent admission for weakness and LE swelling, septic shock due to cellulitis of the RLE, Complicated by B PTX, chest tube placements, aspiration PNA.  Here after being sent in for worsening LE wounds by her wound nurse.     She started being treated w/ Ocrelizumab back in Feb. Per pt and family about 4 weeks after began to notice increased RLE swelling, scaling and weakness.   Today initial BP ok , but has had persistent hypotension since then, with maps in low 60s, no improvement with fluids.  Baseline maps in 70s at least.  + 2.5 L in ED    Assessment and Plan: Septic shock, resolved Secondary to LE cellulitis. Patient required ICU admission and vasopressor (Levophed) support. Patient managed on antibiotics with improvement of shock. Vasopressor discontinued after 3 days. Blood cultures with no growth. Resolved.   Lower extremity cellulitis, resolved Patient treated empirically with vancomycin/cefepime and was transitioned to Linezolid/Unasyn. ID consulted with recommendations to discontinue antibiotic treatment as she completed 7 days of treatment. Wound care consulted with recommendations for Xeroform gauze to legs.   Lower extremity dermatitis Chronic. Acute infection is resolved.  Patient will need continued wound care and good nutritional support.   Refeeding syndrome Patient with hypokalemia, hypomagnesemia and hypophosphatemia initially after starting tube feeds. Electrolytes repleted. Now resolved.   Severe malnutrition Tube feeds currently. Dietitian recommending nocturnal tube feeds to encourage daytime oral intake. Patient cleared for a dysphagia 3 diet. Juven, vitamin C, Zinc on discharge.   Diarrhea Likely related to tube feeds. Associated incontinence. Afebrile. No abdominal pain. Minimally elevated WBC. Managed on imodium PRN  Hypotension Managed on vasopressors and transitioned to midodrine. Attempting to wean but patient requesting discharge. Discharge on midodrine 5 mg TID with recommendations to continue weaning.   GERD Managed on Protonix while inpatient.   Normocytic anemia Stable.   Seizure disorder Continue Keppra   Diarrhea Likely related to tube feeds. Imodium PRN.   Multiple sclerosis Ambulates with a walker at baseline. Continue gabapentin   Anxiety Continue hydroxyzine PRN   Pressure injury Mid/lower sacrum, POA. Wound care recommending sacral foam dressing.   Consultants: PCCM Procedures performed: None  Disposition: Home health Diet recommendation: Dysphagia type 3 thin Liquid DISCHARGE MEDICATION: Allergies as of 06/03/2021       Reactions   Lamictal [lamotrigine] Rash   Other    Cats: Rash  Shrimp scampi sauce: rash   Tramadol    Lowers seizure threshold        Medication List     TAKE these medications    ascorbic acid 500 MG tablet Commonly known as: VITAMIN C Take 1 tablet (500 mg total) by mouth 2 (two) times daily.   Fish Oil 1000 MG Caps Take 1,000 mg by mouth daily.   gabapentin 300 MG capsule Commonly known as: NEURONTIN Take 300 mg  by mouth 2 (two) times daily.   hydrOXYzine 25 MG tablet Commonly known as: ATARAX Take 25 mg by mouth 2 (two) times daily as needed for anxiety.    ibuprofen 200 MG tablet Commonly known as: ADVIL Take 600 mg by mouth every 6 (six) hours as needed for moderate pain.   levETIRAcetam 500 MG tablet Commonly known as: KEPPRA Take 1 tablet (500 mg total) by mouth 2 (two) times daily.   midodrine 5 MG tablet Commonly known as: PROAMATINE Take 1 tablet (5 mg total) by mouth every 8 (eight) hours.   nutrition supplement (JUVEN) Pack Take 1 packet by mouth 2 (two) times daily between meals. Start taking on: Jun 04, 2021   QC TUMERIC COMPLEX PO Take 475 mg by mouth daily.   Vitamin D3 25 MCG (1000 UT) Caps Take 1,000 Units by mouth daily.   zinc sulfate 220 (50 Zn) MG capsule Take 1 capsule (220 mg total) by mouth daily. Start taking on: Jun 04, 2021               Discharge Care Instructions  (From admission, onward)           Start     Ordered   06/03/21 0000  Discharge wound care:       Comments: -Wound care. Daily. Bathe legs every day and roughly towel dry to assist with removal of loose dry skin. Then apply Eucerin cream to bilateral legs and feet  -Foam dressing to sacrum, change every 3 days or as needed for soiling   06/03/21 1314            Follow-up Information     [facility]. Schedule an appointment as soon as possible for a visit in 1 week(s).   Specialty: Rheumatology Why: For hospital follow-up Contact information: [place] [address] [phone]                Discharge Exam: BP (!) 97/49 (BP Location: Right Arm)   Pulse 82   Temp 98.1 F (36.7 C)   Resp 17   Wt 59.5 kg   SpO2 100%   BMI 22.52 kg/m   General exam: Appears calm and comfortable Respiratory system: Respiratory effort normal. Gastrointestinal system: Abdomen is non-distended Central nervous system: Alert and oriented. Skin: Bilateral LE in gauze wraps Psychiatry: Judgement and insight appear normal. Mood & affect appropriate.   Condition at discharge:  improving  The results of significant diagnostics from this hospitalization (including imaging, microbiology, ancillary and laboratory) are listed below for reference.   Imaging Studies: DG Chest 1 View  Result Date: 05/06/2021 CLINICAL DATA:  Right pneumothorax EXAM: CHEST  1 VIEW COMPARISON:  Previous studies including the examination done earlier today FINDINGS: There is interval placement of a larger caliber third right chest tube. There is almost complete resolution of left pneumothorax. There is possible minimal left apical pneumothorax. There is increase in amount of subcutaneous emphysema in the right chest wall. Increased density in the left lower lung fields obscuring left hemidiaphragm may suggest pleural effusion and atelectasis/pneumonia. Enteric tube is noted traversing the esophagus. IMPRESSION: There is almost complete clearing of right pneumothorax after placement of third right chest tube.No significant interval changes are noted in the lung fields. Electronically Signed   By: Elmer Picker M.D.   On: 05/06/2021 12:15   CT CHEST WO CONTRAST  Addendum Date: 05/07/2021   ADDENDUM REPORT: 05/07/2021 03:18 ADDENDUM: Given worsening of imaging findings over time,  repositioning of large-bore chest tube is recommended. There is also communication of cutaneous emphysema and pneumothorax in the right lateral chest wall chest wall adjacent to the insertion site of the large-bore chest tube between the T5 and T6 intercostal space. Findings were relayed to PA Desai at 3:09 a.m. Electronically Signed   By: Brett Fairy M.D.   On: 05/07/2021 03:18   Addendum Date: 05/07/2021   ADDENDUM REPORT: 05/07/2021 02:59 ADDENDUM: Critical findings were reported to PA Rahul Desai at 12:41 a.m. No obvious evidence of bronchopleural fistula is seen. Findings may be related to recent large-bore chest tube placement. Repositioning is recommended. Electronically Signed   By: Brett Fairy M.D.   On:  05/07/2021 02:59   Result Date: 05/07/2021 CLINICAL DATA:  Pneumothorax. EXAM: CT CHEST WITHOUT CONTRAST TECHNIQUE: Multidetector CT imaging of the chest was performed following the standard protocol without IV contrast. RADIATION DOSE REDUCTION: This exam was performed according to the departmental dose-optimization program which includes automated exposure control, adjustment of the mA and/or kV according to patient size and/or use of iterative reconstruction technique. COMPARISON:  04/26/2021, 05/07/2021. FINDINGS: Cardiovascular: The heart is normal in size and there is a small pericardial effusion. Multi-vessel coronary artery calcifications are noted. There is atherosclerotic calcification of the aorta without evidence of aneurysm. The pulmonary trunk is normal in caliber. Mediastinum/Nodes: A rim calcified nodule is present in the right lobe of the thyroid gland. The trachea is within normal limits. An enteric tube is noted in the esophagus. No mediastinal or axillary lymphadenopathy. Evaluation of the hilar regions is limited due to lack of IV contrast. There is extensive pneumomediastinum. Lungs/Pleura: There is a large right pneumothorax with chest tubes in place. There is a chest tube entering the anterior upper chest which appears appropriate in position. There is a large bore chest tube entering the right lateral chest wall with occlusion of the tip which terminates in the right lower lobe. The distal tip of the patchy consolidation is noted in the right upper lobe and there is right middle lobe collapse. Atelectasis is noted in the right lower lobe. Emphysematous changes are present in the lungs bilaterally. There is a cavitary lesion in the left upper lobe measuring 2.5 cm, axial image 71. Scattered atelectasis or infiltrate is noted in the left lung. There are small bilateral pleural effusions. Upper Abdomen: An enteric tube is seen in the stomach. Bilateral renal cysts are noted. Musculoskeletal:  There is extensive subcutaneous emphysema in the anterior and posterior chest walls bilaterally and lateral chest wall on the right. Degenerative changes are present in the midthoracic spine. No acute osseous abnormality. IMPRESSION: 1. Large right pneumothorax with 2 chest tubes in place. The anterior pigtail catheter appears appropriate in position. There is a lateral large-bore chest tube on the right which terminates in the right lower lobe in the tip of the chest tube appears occluded. Repositioning is suggested. 2. Right upper lobe consolidation and right middle lobe collapse with patchy atelectasis or infiltrate bilaterally. 3. Small bilateral pleural effusions. 4. Cavitary lesion in the left upper lobe, not significantly changed from the prior exam. 5. Extensive pneumomediastinum and subcutaneous emphysema. 6. Emphysema. 7. Aortic atherosclerosis and coronary artery calcifications. Electronically Signed: By: Brett Fairy M.D. On: 05/07/2021 02:27   US Renal  Result Date: 05/25/2021 CLINICAL DATA:  Acute renal injury EXAM: RENAL / URINARY TRACT ULTRASOUND COMPLETE COMPARISON:  None Available. FINDINGS: Right Kidney: Renal measurements: 11.0 x 4.7 x 4.1 cm. = volume: 110 mL.  1.9 cm upper pole simple cyst is noted. No further follow-up is recommended. Mild cortical thinning is noted. Left Kidney: Renal measurements: 11.3 x 4.5 x 4.1 cm. = volume: 108 mL. Cortical thinning is noted. Mild fullness of the renal pelvis is seen. Bladder: Well distended with dependent echogenic material which may be related to underlying UTI. Other: None. IMPRESSION: Changes suspicious for underlying urinary tract infection. Relate with laboratory values. Mild fullness of the left renal pelvis without definitive caliceal dilatation. Simple right renal cyst. Electronically Signed   By: Inez Catalina M.D.   On: 05/25/2021 21:42   DG Chest Portable 1 View  Result Date: 05/26/2021 CLINICAL DATA:  Hypotension EXAM: PORTABLE CHEST  1 VIEW COMPARISON:  05/12/2021 FINDINGS: Lungs are clear.  No pleural effusion or pneumothorax. The heart is normal in size. IMPRESSION: No evidence of acute cardiopulmonary disease. Electronically Signed   By: Julian Hy M.D.   On: 05/26/2021 01:20   DG CHEST PORT 1 VIEW  Result Date: 05/12/2021 CLINICAL DATA:  Pneumothorax, follow-up study EXAM: PORTABLE CHEST 1 VIEW COMPARISON:  Chest x-ray from yesterday FINDINGS: There has been interval removal of the right-sided chest tube. No residual pneumothorax seen. The feeding tube continue to be seen and its distal end is not included on the image. Heart is borderline. Atheromatous calcifications of the arch of the aorta. There are chronic interstitial changes seen and are more prominent at the left side with the cavitary lesion in the left upper lobe. IMPRESSION: There has been interval removal of the right-sided chest tube. No residual pneumothorax. Feeding tube is stable. Lungs are stable as well. Electronically Signed   By: Frazier Richards M.D.   On: 05/12/2021 08:46   DG CHEST PORT 1 VIEW  Result Date: 05/11/2021 CLINICAL DATA:  Follow-up right-sided pneumothorax. EXAM: PORTABLE CHEST 1 VIEW COMPARISON:  Earlier film, same date. FINDINGS: The more lateral inferior chest tube is still in place. The more superior and medial Cope loop type chest tube is been removed. No residual or recurrent pneumothorax is identified. Streaky right basilar atelectasis persists. The left lung is grossly clear. IMPRESSION: Removal of the more medial superior chest tube. No residual or recurrent pneumothorax. Electronically Signed   By: Marijo Sanes M.D.   On: 05/11/2021 15:14   DG CHEST PORT 1 VIEW  Result Date: 05/11/2021 CLINICAL DATA:  Pneumothorax, chest tube present. EXAM: PORTABLE CHEST 1 VIEW COMPARISON:  Same day radiograph at 0446 hours FINDINGS: Unchanged position of the 2 right-sided thoracostomy tubes. No visible pneumothorax. Similar low lung volumes with  bibasilar opacities superimposed on chronic lung changes favor atelectasis. Similar mild cardiac enlargement.  Aortic atherosclerosis. Enteric feeding catheter courses below the diaphragm with tip obscured by collimation. The visualized skeletal structures are unchanged. IMPRESSION: 1. Stable position of the 2 right-sided thoracostomy tubes. No visible pneumothorax. 2. Similar low lung volumes with bibasilar opacities superimposed on chronic lung changes, favor atelectasis. Electronically Signed   By: Dahlia Bailiff M.D.   On: 05/11/2021 11:36   DG CHEST PORT 1 VIEW  Result Date: 05/11/2021 CLINICAL DATA:  Encounter for pneumothorax. EXAM: PORTABLE CHEST 1 VIEW COMPARISON:  Portable chest yesterday at 5:28 p.m. FINDINGS: 4:46 a.m., 05/11/2021. Two right chest tubes are unchanged in positioning. There is no visible pneumothorax. The lung volumes are low with hazy atelectasis or pneumonitis in the lung bases and superimposed chronic changes. The heart is slightly enlarged. The central vessels are normal caliber. There is aortic atherosclerosis with normal mediastinal configuration.  Mild osteopenia. IMPRESSION: No changes in the overall aeration and positioning of right chest tubes. No pneumothorax is seen. Electronically Signed   By: Telford Nab M.D.   On: 05/11/2021 07:33   DG CHEST PORT 1 VIEW  Result Date: 05/10/2021 CLINICAL DATA:  Right-sided chest tube.  Pneumothorax. EXAM: PORTABLE CHEST 1 VIEW COMPARISON:  Same day. FINDINGS: Stable cardiomediastinal silhouette. Two right-sided chest tubes are noted which are unchanged in position. No definite pneumothorax is noted. Bibasilar atelectasis or infiltrates are noted. IMPRESSION: Stable position of 2 right-sided chest tubes. No definite pneumothorax. Electronically Signed   By: Marijo Conception M.D.   On: 05/10/2021 17:43   DG Chest Port 1 View  Result Date: 05/10/2021 CLINICAL DATA:  Acute respiratory failure. EXAM: PORTABLE CHEST 1 VIEW COMPARISON:   Radiographs 05/09/2021 and 05/08/2021. FINDINGS: 0436 hours. Two right-sided chest tubes remain in place, position unchanged. Feeding tube projects below the diaphragm, tip not visualized. The heart size and mediastinal contours are stable. There are stable patchy bibasilar opacities with possible small bilateral pleural effusions. No significant pneumothorax. Minimal residual emphysema within the right chest wall. IMPRESSION: Stable appearance of the chest without significant residual pneumothorax. Unchanged bibasilar pulmonary opacities. Electronically Signed   By: Richardean Sale M.D.   On: 05/10/2021 08:02   DG Chest Port 1 View  Result Date: 05/09/2021 CLINICAL DATA:  A 61 year old female present is for evaluation of pneumothorax with chest tubes in place. EXAM: PORTABLE CHEST 1 VIEW COMPARISON:  Prior studies from May 08, 2021. FINDINGS: EKG leads project over the chest. Feeding tube in place coursing through in off the field of the radiograph. Cardiomediastinal contours and hilar structures are stable. Basilar airspace disease persists. Potential tiny RIGHT apical pneumothorax is unchanged. Marked interval improvement with subcutaneous emphysema along the RIGHT chest wall since previous imaging. Two RIGHT-sided chest tubes shows stable position. One large-bore and 1 small bore chest tube. Increased interstitial markings are stable. On limited assessment there is no acute skeletal process. IMPRESSION: 1. Marked interval improvement with subcutaneous emphysema along the RIGHT chest wall. Otherwise no change in the appearance of the chest. 2. Potential residual tiny apical pneumothorax with chest tubes in stable position. 3. Stable basilar airspace disease and interstitial prominence. Electronically Signed   By: Zetta Bills M.D.   On: 05/09/2021 08:58   DG CHEST PORT 1 VIEW  Result Date: 05/08/2021 CLINICAL DATA:  Pneumothorax EXAM: PORTABLE CHEST 1 VIEW COMPARISON:  05/08/2021 at 0544 hours  FINDINGS: Interval repositioning of 2 right-sided chest tubes, both of which are now directed more towards the lung apex. Near complete resolution of right-sided pneumothorax with only a trace residual right apical pneumothorax remaining. Stable cardiomediastinal contours. Enteric tube remains in place. Streaky bibasilar opacities. No large pleural effusion. No left-sided pneumothorax. Extensive right chest wall subcutaneous emphysema. IMPRESSION: Near complete resolution of right-sided pneumothorax with only a trace residual right apical pneumothorax remaining. Electronically Signed   By: Davina Poke D.O.   On: 05/08/2021 12:19   DG Chest Port 1 View  Addendum Date: 05/08/2021   ADDENDUM REPORT: 05/08/2021 09:11 ADDENDUM: Critical Value/emergent results were called by telephone at the time of interpretation on 05/08/2021 at 9:11 am to provider [facility] , who verbally acknowledged these results. Electronically Signed   By: Zetta Bills M.D.   On: 05/08/2021 09:11   Result Date: 05/08/2021 CLINICAL DATA:  Acute respiratory failure in a 61 year old female. EXAM: PORTABLE CHEST 1 VIEW COMPARISON:  Comparison made with May 07, 2021. FINDINGS: Interval recurrence of RIGHT-sided pneumothorax despite presence of 2 RIGHT-sided chest tubes. These chest tubes show stable appearance 1 large-bore in the inferior chest and the other a small bore catheter with pigtail in the superomedial chest. Image rotated to the RIGHT. Accounting for this there is some mediastinal shift present. Pneumothorax measuring 4 cm from the apex is likely moderately large. Increased interstitial markings and nodular opacities with basilar consolidation as on the previous study. Feeding tube courses through in off the field of the radiograph. EKG leads project over the chest. Subcutaneous emphysema present along the RIGHT chest wall. On limited assessment there is no acute skeletal process. IMPRESSION: 1. Interval recurrence of  RIGHT-sided pneumothorax, now moderately large despite presence of 2 RIGHT-sided chest tubes. Mediastinal shift is present implying some tension. 2. Stable bilateral interstitial and airspace opacities with basilar consolidation and suspected LEFT-sided effusion. A call is out to the referring provider to further discuss findings in the above case. Electronically Signed: By: Zetta Bills M.D. On: 05/08/2021 08:56   DG CHEST PORT 1 VIEW  Result Date: 05/07/2021 CLINICAL DATA:  61 year old female with history of pneumothorax. EXAM: PORTABLE CHEST 1 VIEW COMPARISON:  May 07, 2021. FINDINGS: Since the previous imaging study the large-bore chest tube has been retracted approximately 1.5-2 cm. The pigtail catheter appears to have been advanced, tip projects over the upper mediastinum likely in the anterior lung. Feeding tube in place. EKG leads project over the chest. Extensive RIGHT sided subcutaneous emphysema has improved slightly since previous imaging. Cardiomediastinal contours and hilar structures are stable. No visible RIGHT pneumothorax. LEFT chest with continued airspace disease at the LEFT lung base obscuring LEFT hemidiaphragm. On limited assessment there is no acute skeletal process. IMPRESSION: 1. Interval slight retraction of the large-bore RIGHT-sided chest tube. 2. The RIGHT pigtail catheter appears to have been advanced, tip projects over the upper mediastinum likely in the anterior lung this could also perhaps be due to further re-expansion of the RIGHT lung since previous imaging. Could consider slight retraction based on position as warranted. 3. Continued but reduced subcutaneous emphysema. 4. No visible RIGHT pneumothorax. 5. Continued airspace disease at the LEFT lung base. Electronically Signed   By: Zetta Bills M.D.   On: 05/07/2021 09:08   DG CHEST PORT 1 VIEW  Result Date: 05/07/2021 CLINICAL DATA:  Follow-up pneumothorax EXAM: PORTABLE CHEST 1 VIEW COMPARISON:  05/06/2021,  05/05/2021, 05/04/2021 FINDINGS: Esophageal tube tip is below the diaphragm but incompletely visualized. 2 right-sided chest tubes remain in place. Increasing subcutaneous emphysema in the right neck and chest wall. Persistent moderate to large right pneumothorax. Increasing airspace disease at the right mid lung and right base. Dense consolidation left lung base. Diffuse reticular and ground-glass opacity throughout the left thorax. Stable cardiac size. IMPRESSION: 1. Similar positioning of the right-sided chest tube side with grossly stable size of moderate to large right pneumothorax 2. Increasing subcutaneous emphysema in the right chest wall and neck 3. Increasing airspace disease in the right mid lung and right base. No change in dense left lung base consolidation with diffuse interstitial and ground-glass disease Electronically Signed   By: Donavan Foil M.D.   On: 05/07/2021 01:03   DG Chest Port 1 View  Result Date: 05/06/2021 CLINICAL DATA:  Pneumothorax EXAM: PORTABLE CHEST 1 VIEW COMPARISON:  05/06/2021 FINDINGS: Right chest tube remain in place with continued moderate to large right pneumothorax, unchanged. Subcutaneous emphysema throughout the right chest wall is also unchanged. Left lower lobe  airspace opacity continues, stable. Heart is normal size. IMPRESSION: Stable moderate to large right pneumothorax with right chest tubes in place. Stable left lower lobe opacity. Electronically Signed   By: Rolm Baptise M.D.   On: 05/06/2021 21:19   DG Chest Port 1 View  Result Date: 05/06/2021 CLINICAL DATA:  Shortness of breath EXAM: PORTABLE CHEST 1 VIEW COMPARISON:  05/06/2021 at 1919 hours FINDINGS: Moderate to large right pneumothorax, unchanged. Two indwelling right chest tubes. Extensive subcutaneous emphysema along the right lateral chest wall. Patchy left mid/lower lung opacity, unchanged. The heart is normal in size. Enteric tube coursing into the stomach. IMPRESSION: Moderate to large right  pneumothorax, unchanged. Two indwelling right chest tubes. Electronically Signed   By: Julian Hy M.D.   On: 05/06/2021 20:30   DG Chest Port 1 View  Result Date: 05/06/2021 CLINICAL DATA:  Acute respiratory failure EXAM: PORTABLE CHEST 1 VIEW COMPARISON:  Chest x-ray dated April 16, 2021 FINDINGS: Cardiac and mediastinal contours are unchanged. Moderate to large right pneumothorax, increased in size when compared with prior exam. Interval removal of more laterally positioned right-sided chest tube. Two right-sided chest tubes remain in place. Bilateral interstitial opacities, unchanged when compared with prior exam. No large pleural effusion. IMPRESSION: Moderate to large right pneumothorax, increased in size when compared with prior exam. Interval removal of more laterally positioned right-sided chest tube. Two right-sided chest tubes remain in place. Critical Value/emergent results were called by telephone at the time of interpretation on 05/06/2021 at 7:41 pm to provider Trevor Mace , who verbally acknowledged these results. Electronically Signed   By: Yetta Glassman M.D.   On: 05/06/2021 19:42   DG CHEST PORT 1 VIEW  Result Date: 05/06/2021 CLINICAL DATA:  Pneumothorax. EXAM: PORTABLE CHEST 1 VIEW COMPARISON:  Same day. FINDINGS: Stable position of 2 right-sided chest tubes. Mild right apical pneumothorax is noted which has enlarged slightly compared to prior exam. Feeding tube is seen entering stomach. Stable left basilar atelectasis or infiltrate is noted with associated pleural effusion. IMPRESSION: Stable position of 2 right-sided chest tubes with mildly increased right apical pneumothorax. Stable left basilar atelectasis or infiltrate is noted with associated pleural effusion. Electronically Signed   By: Marijo Conception M.D.   On: 05/06/2021 11:09   DG CHEST PORT 1 VIEW  Result Date: 05/06/2021 CLINICAL DATA:  61 year old female with history of weakness. Oxygen dependent patient.  EXAM: PORTABLE CHEST 1 VIEW COMPARISON:  Multiple priors, most recently chest x-ray 05/05/2021. FINDINGS: A feeding tube is seen extending into the abdomen, however, the tip of the feeding tube extends below the lower margin of the image. Two right-sided small bore chest tubes are noted, with tips reformed in the lateral aspect of the mid right hemithorax and medial aspect of the upper right hemithorax. Lung volumes are low. Diffuse interstitial prominence throughout the lungs bilaterally (left-greater-than-right). Additional left basilar opacity which may reflect atelectasis and/or consolidation, with small left pleural effusion. No definite right pleural effusion. No appreciable pneumothorax. Pulmonary vasculature does not appearing origin. Heart size is normal. Upper mediastinal contours are within normal limits. Atherosclerotic calcifications in the thoracic aorta. Subcutaneous emphysema in the right chest wall. IMPRESSION: 1. Support apparatus, as above. 2. The appearance the chest is most suggestive of severe bronchitis and bilateral multilobar bronchopneumonia (left-greater-than-right). 3. Small left pleural effusion with atelectasis and/or consolidation in the left lung base. 4. Known thick-walled aggressive appearing left upper lobe cavitary nodule better demonstrated on prior chest CT. Electronically Signed  By: Vinnie Langton M.D.   On: 05/06/2021 08:14   DG CHEST PORT 1 VIEW  Result Date: 05/05/2021 CLINICAL DATA:  Follow-up pneumothorax. EXAM: PORTABLE CHEST 1 VIEW COMPARISON:  05/05/2021, earlier today FINDINGS: Two right-sided chest tubes are unchanged in position from previous exam. Small right apical pneumothorax is new when compared with the previous study. This measures 1.1 cm over the apex. Similar appearance of right chest wall soft tissue emphysema which extends into the right lower neck. Feeding tube tip courses below the level of the hemidiaphragms. Stable cardiomediastinal contours.  Left lung base opacity is unchanged and may represent airspace disease or atelectasis. Mild diffuse increase interstitial markings are again noted bilaterally and appear unchanged. IMPRESSION: 1. Small recurrent right apical pneumothorax measures approximately 1.1 cm in maximum thickness. Unchanged position of right-sided chest tubes. 2. No change in aeration to the left lung base. 3. Stable support apparatus. 4. Stable appearance of right chest wall soft tissue emphysema. These results will be called to the ordering clinician or representative by the Radiologist Assistant, and communication documented in the PACS or [employer]. Electronically Signed   By: Kerby Moors M.D.   On: 05/05/2021 17:28   DG CHEST PORT 1 VIEW  Result Date: 05/05/2021 CLINICAL DATA:  Pneumothorax. EXAM: PORTABLE CHEST 1 VIEW COMPARISON:  05/04/2021 FINDINGS: Two RIGHT chest tubes in place. Increased subcutaneous gas along the RIGHT lateral chest wall. No pneumothorax. Central venous line and feeding tube unchanged. LEFT basilar atelectasis and small effusion unchanged. IMPRESSION: 1. Increase subcutaneous gas along the RIGHT lateral chest wall. 2. Two RIGHT chest tubes in place without evidence of pneumothorax. Electronically Signed   By: Suzy Bouchard M.D.   On: 05/05/2021 08:45   DG CHEST PORT 1 VIEW  Result Date: 05/04/2021 CLINICAL DATA:  Follow-up of pneumothorax EXAM: PORTABLE CHEST 1 VIEW COMPARISON:  Earlier today at 1:44 p.m. FINDINGS: 3:14 p.m. Placement of a second right-sided pigtail chest tube more laterally. The Chin overlies the apices. Numerous leads and wires project over the chest. Nasogastric tube extends beyond the inferior aspect of the film. Right internal jugular line terminates over the superior caval/atrial junction. Significant re-expansion of the right lung with approximately 10% right apical pneumothorax remaining. Visceral pleural line 1.0 cm from chest wall. Small left pleural effusion is  unchanged. Diffuse interstitial prominence and indistinctness. Improved right and persistent left base airspace disease. IMPRESSION: Placement of a second right-sided pigtail chest tube with decrease in approximately 10% right apical pneumothorax. Interstitial prominence, likely due to mild interstitial edema. Small left pleural effusion with adjacent airspace disease. Electronically Signed   By: Abigail Miyamoto M.D.   On: 05/04/2021 15:26   DG CHEST PORT 1 VIEW  Result Date: 05/04/2021 CLINICAL DATA:  Pneumothorax, shortness of breath EXAM: PORTABLE CHEST 1 VIEW COMPARISON:  Earlier same day FINDINGS: Right chest tube is present. Right IJ central line and enteric tubes are also again noted. Increase in size of now large right pneumothorax. There is associated significant atelectasis of the right lung. Mediastinal shift is difficult to assess due to leftward rotation. Increased interstitial opacities of the left lung. IMPRESSION: Increase in size of now large right pneumothorax with associated significant right lung atelectasis. Patient is rotated and mediastinal shift is difficult to assess. Increased interstitial opacities at the left lung. These results will be called to the ordering clinician or representative by the Radiologist Assistant, and communication documented in the PACS or [employer]. Electronically Signed   By: Malachi Carl  Patel M.D.   On: 05/04/2021 14:03   DG Abd Portable 1V  Result Date: 05/28/2021 CLINICAL DATA:  Feeding tube placement. EXAM: PORTABLE ABDOMEN - 1 VIEW COMPARISON:  04/28/2021 FINDINGS: A small bore feeding tube is noted with tip overlying the distal stomach. Bowel gas pattern is unremarkable. No dilated bowel loops are identified. Contrast within the colon is noted. IMPRESSION: Small bore feeding tube with tip overlying the distal stomach. Electronically Signed   By: Margarette Canada M.D.   On: 05/28/2021 15:55   DG Swallowing Func-Speech Pathology  Result Date:  05/13/2021 Table formatting from the original result was not included. Images from the original result were not included. Objective Swallowing Evaluation: Type of Study: Bedside Swallow Evaluation  Patient Details Name: NYSHA KOPLIN MRN: [id number] Date of Birth: [birth year] Today's Date: 05/13/2021 Time: SLP Start Time (ACUTE ONLY): 1000 -SLP Stop Time (ACUTE ONLY): 1030 SLP Time Calculation (min) (ACUTE ONLY): 30 min Past Medical History: Past Medical History: Diagnosis Date  Allergy   seasonal  Anemia   GERD (gastroesophageal reflux disease)   HLA B27 (HLA B27 positive)   Hx: UTI (urinary tract infection)   Hyperplastic colon polyp   Hypertension   IBS (irritable bowel syndrome)   Multiple sclerosis ([place])   Seizures (HCC)   hx epilepsy-last seizure 9/14 after taking tramadol Past Surgical History: Past Surgical History: Procedure Laterality Date  APPENDECTOMY    BLADDER SURGERY  2007  CESAREAN SECTION    x 2   ORIF TOE FRACTURE Left 06/27/2013  Procedure: OPEN REDUCTION INTERNAL FIXATION (ORIF) LEFT FIFTH METATARSAL;  Surgeon: Wylene Simmer, MD;  Location: [facility];  Service: Orthopedics;  Laterality: Left;  PARTIAL HYSTERECTOMY  2006  TONSILLECTOMY    TUBAL LIGATION   HPI: Patient is a 61 y.o. female with PMH: MS, ANEMIA, GERD, HTN, IBS, seizures. She presented to the hospital c/o weakness and leg pain for 3 weeks. In the ER she was hypotensive, tachycardic and required vasopressors. She recieved 5/2 L fluid overnight and was hyperkalemic, in renal failure and had leukocytosis. CXR in ER showed pneumothorax and chest tube was placed. She is being treated for Septic shock due to cellulitis in setting of immunocompromised state improved. SLP swallow eval ordered due to RN concern for patient's ability to swallow PO's. Patient had a second chest tube placed 4/25 and later that evening code stroke was called; neurology did not see neurological focality. On 4/27 a second, large bore chest tube was  replaced. Chest tubes removed 5/2.  Subjective: pleasant, awake and alert  Recommendations for follow up therapy are one component of a multi-disciplinary discharge planning process, led by the attending physician.  Recommendations may be updated based on patient status, additional functional criteria and insurance authorization. Assessment / Plan / Recommendation   05/13/2021  10:00 AM Clinical Impressions Clinical Impression Pt demosntrates a mild oropahrygneal dyspahgia related to decreased respiratory endurance and inability to sustain prolonged airway closure. Pt is easily fatigued and short of breath. There are instances of normal timely swallowing with adequate airway protection with thin liquids. But there are also instances of brief oral holding while panting with premature spillage and pooling of liquid in the pyriforms that is penetrated to the vocal folds during the swallow. Pt did have one throat clear, but also later let penetrate sit on vocal folds without response. Quantity was so trace that it never passed to the trachea. Pt also had some mild vallecular residuals with solids, which is likely  more related to dry mucosa than overt weakness. A liquid wash was helpful. Pt can continue single sips of thin liquids and also advance to soft solids with intermittent throat clearing encouraged. An esophageal sweep did show a barium tablet at the distal esophagus that did not pass with two liquid washes. Pt had frequent belching during study. If pt does not tolerate solids a GI consult may be warranted. Will f/u for tolerance. SLP Visit Diagnosis Dysphagia, unspecified (R13.10) Impact on safety and function Mild aspiration risk     05/13/2021  10:00 AM Treatment Recommendations Treatment Recommendations Therapy as outlined in treatment plan below     04/27/2021   6:08 PM Prognosis Prognosis for Safe Diet Advancement Good   05/13/2021  10:00 AM Diet Recommendations SLP Diet Recommendations Dysphagia 3 (Mech soft)  solids;Thin liquid Liquid Administration via Cup;Straw Medication Administration Whole meds with puree Compensations Slow rate;Small sips/bites;Clear throat intermittently;Follow solids with liquid Postural Changes Remain semi-upright after after feeds/meals (Comment);Seated upright at 90 degrees     05/13/2021  10:00 AM Other Recommendations Recommended Consults Consider GI evaluation Oral Care Recommendations Oral care BID Follow Up Recommendations Long-term institutional care without follow-up therapy Assistance recommended at discharge Frequent or constant Supervision/Assistance Functional Status Assessment Patient has had a recent decline in their functional status and demonstrates the ability to make significant improvements in function in a reasonable and predictable amount of time.   05/13/2021  10:00 AM Frequency and Duration  Speech Therapy Frequency (ACUTE ONLY) min 2x/week Treatment Duration 1 week     05/13/2021  10:00 AM Oral Phase Oral Phase [facility]    05/13/2021  10:00 AM Pharyngeal Phase Pharyngeal Phase Impaired Pharyngeal- Nectar Cup Pharyngeal residue - valleculae Pharyngeal- Nectar Straw Pharyngeal residue - valleculae Pharyngeal- Thin Cup Penetration/Aspiration during swallow;Delayed swallow initiation-pyriform sinuses Pharyngeal Material does not enter airway;Material enters airway, CONTACTS cords and not ejected out;Material enters airway, CONTACTS cords and then ejected out Pharyngeal- Thin Straw WFL;Delayed swallow initiation-pyriform sinuses;Penetration/Aspiration during swallow Pharyngeal Material enters airway, CONTACTS cords and not ejected out;Material enters airway, CONTACTS cords and then ejected out;Material does not enter airway Pharyngeal- Puree Pharyngeal residue - valleculae Pharyngeal- Mechanical Soft Pharyngeal residue - valleculae Pharyngeal- Pill Pharyngeal residue - valleculae     View : No data to display.    DeBlois, Katherene Ponto 05/13/2021, 10:50 AM                     VAS [place] ABI  WITH/WO TBI  Result Date: 05/31/2021  LOWER EXTREMITY DOPPLER STUDY Patient Name:  NGUYEN BUTLER  Date of Exam:   05/31/2021 Medical Rec #: [id number]        Accession #:    [id number] Date of Birth: 05/19/1960        Patient Gender: F Patient Age:   64 years Exam Location:  [facility] Procedure:      VAS [place] ABI WITH/WO TBI Referring Phys: Regan Lemming --------------------------------------------------------------------------------  Indications: Scaling, swelling, redness, cellulitis of lower extremities. Other Factors: Multiple Sclerosis.  Comparison Study: No prior study on file Performing Technologist: Sharion Dove RVS  Examination Guidelines: A complete evaluation includes at minimum, Doppler waveform signals and systolic blood pressure reading at the level of bilateral brachial, anterior tibial, and posterior tibial arteries, when vessel segments are accessible. Bilateral testing is considered an integral part of a complete examination. Photoelectric Plethysmograph (PPG) waveforms and toe systolic pressure readings are included as required and additional duplex testing as needed. Limited examinations  for reoccurring indications may be performed as noted.  ABI Findings: +---------+------------------+-----+-----------+--------+ Right    Rt Pressure (mmHg)IndexWaveform   Comment  +---------+------------------+-----+-----------+--------+ Brachial 121                    multiphasic         +---------+------------------+-----+-----------+--------+ PTA      133               1.10 multiphasic         +---------+------------------+-----+-----------+--------+ DP       132               1.09 multiphasic         +---------+------------------+-----+-----------+--------+ Great Toe109               0.90                     +---------+------------------+-----+-----------+--------+ +---------+------------------+-----+-----------+----------------------------+ Left     Lt Pressure  (mmHg)IndexWaveform   Comment                      +---------+------------------+-----+-----------+----------------------------+ Brachial                        multiphasicIV covered in bandages at [facility] +---------+------------------+-----+-----------+----------------------------+ PTA      122               1.01 multiphasic                             +---------+------------------+-----+-----------+----------------------------+ DP       120               0.99 multiphasic                             +---------+------------------+-----+-----------+----------------------------+ Great Toe91                0.75                                         +---------+------------------+-----+-----------+----------------------------+ +-------+-----------+-----------+------------+------------+ ABI/TBIToday's ABIToday's TBIPrevious ABIPrevious TBI +-------+-----------+-----------+------------+------------+ Right  1.10       0.90                                +-------+-----------+-----------+------------+------------+ Left   1.01       0.75                                +-------+-----------+-----------+------------+------------+  Summary: Right: Resting right ankle-brachial index is within normal range. No evidence of significant right lower extremity arterial disease. The right toe-brachial index is normal. Left: Resting left ankle-brachial index is within normal range. No evidence of significant left lower extremity arterial disease. The left toe-brachial index is normal. *See table(s) above for measurements and observations.  Electronically signed by Monica Martinez MD on 05/31/2021 at 5:12:49 PM.    Final    VAS [place] LOWER EXTREMITY VENOUS (DVT) (ONLY [facility] & [facility])  Result Date: 05/26/2021  Lower Venous DVT Study Patient Name:  ANGELIK WALLS [facility]  Date of Exam:   05/25/2021 Medical Rec #: [id number]        Accession #:  [id number] Date of Birth: 05-Aug-1960        Patient Gender: F Patient  Age:   32 years Exam Location:  [facility] Procedure:      VAS [place] LOWER EXTREMITY VENOUS (DVT) Referring Phys: Regan Lemming --------------------------------------------------------------------------------  Indications: Severe pain, erythema, and swelling of bilateral lower legs.  Limitations: Poor ultrasound/tissue interface due to overlying edema and skin changes. Patient pain. Comparison Study: No recent prior studies. Performing Technologist: Darlin Coco RDMS, RVT  Examination Guidelines: A complete evaluation includes B-mode imaging, spectral Doppler, color Doppler, and power Doppler as needed of all accessible portions of each vessel. Bilateral testing is considered an integral part of a complete examination. Limited examinations for reoccurring indications may be performed as noted. The reflux portion of the exam is performed with the patient in reverse Trendelenburg.  +---------+---------------+---------+-----------+----------+---------------+ RIGHT    CompressibilityPhasicitySpontaneityPropertiesThrombus Aging  +---------+---------------+---------+-----------+----------+---------------+ CFV      Full           Yes      Yes                                  +---------+---------------+---------+-----------+----------+---------------+ SFJ      Full                                                         +---------+---------------+---------+-----------+----------+---------------+ FV Prox  Full                                                         +---------+---------------+---------+-----------+----------+---------------+ FV Mid   Full                                                         +---------+---------------+---------+-----------+----------+---------------+ FV DistalFull                                                         +---------+---------------+---------+-----------+----------+---------------+ PFV      Full                                                          +---------+---------------+---------+-----------+----------+---------------+ POP      Full           Yes      Yes                                  +---------+---------------+---------+-----------+----------+---------------+ PTV      Full                                                         +---------+---------------+---------+-----------+----------+---------------+  PERO                    Yes      Yes                  Patent by color +---------+---------------+---------+-----------+----------+---------------+ Gastroc  Full                                                         +---------+---------------+---------+-----------+----------+---------------+   +---------+---------------+---------+-----------+----------+---------------+ LEFT     CompressibilityPhasicitySpontaneityPropertiesThrombus Aging  +---------+---------------+---------+-----------+----------+---------------+ CFV      Full           Yes      Yes                                  +---------+---------------+---------+-----------+----------+---------------+ SFJ      Full                                                         +---------+---------------+---------+-----------+----------+---------------+ FV Prox  Full                                                         +---------+---------------+---------+-----------+----------+---------------+ FV Mid   Full                                                         +---------+---------------+---------+-----------+----------+---------------+ FV DistalFull                                                         +---------+---------------+---------+-----------+----------+---------------+ PFV      Full                                                         +---------+---------------+---------+-----------+----------+---------------+ POP      Full           Yes      Yes                                   +---------+---------------+---------+-----------+----------+---------------+ PTV      Full                                                         +---------+---------------+---------+-----------+----------+---------------+  PERO                    Yes      Yes                  Patent by color +---------+---------------+---------+-----------+----------+---------------+ Gastroc  Full                                                         +---------+---------------+---------+-----------+----------+---------------+     Summary: RIGHT: - There is no evidence of deep vein thrombosis in the lower extremity. However, portions of this examination were limited- see technologist comments above.  - No cystic structure found in the popliteal fossa.  LEFT: - There is no evidence of deep vein thrombosis in the lower extremity. However, portions of this examination were limited- see technologist comments above.  - No cystic structure found in the popliteal fossa.  In setting of concern for underlying PAD, the PTA and DPA were observed to be audibly triphasic bilaterally.  *See table(s) above for measurements and observations. Electronically signed by Harold Barban MD on 05/26/2021 at 12:04:18 AM.    Final     Microbiology: Results for orders placed or performed during the hospital encounter of 05/25/21  Blood culture (routine x 2)     Status: None   Collection Time: 05/25/21  4:08 PM   Specimen: BLOOD  Result Value Ref Range Status   Specimen Description BLOOD BLOOD RIGHT FOREARM  Final   Special Requests   Final    BOTTLES DRAWN AEROBIC AND ANAEROBIC Blood Culture adequate volume   Culture   Final    NO GROWTH 5 DAYS Performed at [facility], [address] [place], [place] [address]    Report Status 05/30/2021 FINAL  Final  Blood culture (routine x 2)     Status: None   Collection Time: 05/25/21  4:20 PM   Specimen: BLOOD RIGHT ARM  Result Value Ref Range Status   Specimen  Description BLOOD RIGHT ARM  Final   Special Requests   Final    BOTTLES DRAWN AEROBIC AND ANAEROBIC Blood Culture adequate volume   Culture   Final    NO GROWTH 5 DAYS Performed at [facility], [place] [address] [place], [place] [address]    Report Status 05/30/2021 FINAL  Final  Resp Panel by RT-PCR (Flu A&B, Covid) Nasal Mucosa     Status: None   Collection Time: 05/26/21  4:31 AM   Specimen: Nasal Mucosa; Nasopharyngeal(NP) swabs in vial transport medium  Result Value Ref Range Status   SARS Coronavirus 2 by RT PCR NEGATIVE NEGATIVE Final    Comment: (NOTE) SARS-CoV-2 target nucleic acids are NOT DETECTED.  The SARS-CoV-2 RNA is generally detectable in upper respiratory specimens during the acute phase of infection. The lowest concentration of SARS-CoV-2 viral copies this assay can detect is 138 copies/mL. A negative result does not preclude SARS-Cov-2 infection and should not be used as the sole basis for treatment or other patient management decisions. A negative result may occur with  improper specimen collection/handling, submission of specimen other than nasopharyngeal swab, presence of viral mutation(s) within the areas targeted by this assay, and inadequate number of viral copies(<138 copies/mL). A negative result must be combined with clinical observations, patient history, and epidemiological  information. The expected result is Negative.  Fact Sheet for Patients:  EntrepreneurPulse.com.au  Fact Sheet for Healthcare Providers:  IncredibleEmployment.be  This test is no t yet approved or cleared by the [place] FDA and  has been authorized for detection and/or diagnosis of SARS-CoV-2 by FDA under an Emergency Use Authorization (EUA). This EUA will remain  in effect (meaning this test can be used) for the duration of the COVID-19 declaration under Section 564(b)(1) of the Act, 21 U.S.C.section 360bbb-3(b)(1), unless the  authorization is terminated  or revoked sooner.       Influenza A by PCR NEGATIVE NEGATIVE Final   Influenza B by PCR NEGATIVE NEGATIVE Final    Comment: (NOTE) The Xpert Xpress SARS-CoV-2/FLU/RSV plus assay is intended as an aid in the diagnosis of influenza from Nasopharyngeal swab specimens and should not be used as a sole basis for treatment. Nasal washings and aspirates are unacceptable for Xpert Xpress SARS-CoV-2/FLU/RSV testing.  Fact Sheet for Patients: EntrepreneurPulse.com.au  Fact Sheet for Healthcare Providers: IncredibleEmployment.be  This test is not yet approved or cleared by the [place] FDA and has been authorized for detection and/or diagnosis of SARS-CoV-2 by FDA under an Emergency Use Authorization (EUA). This EUA will remain in effect (meaning this test can be used) for the duration of the COVID-19 declaration under Section 564(b)(1) of the Act, 21 U.S.C. section 360bbb-3(b)(1), unless the authorization is terminated or revoked.  Performed at [facility], [place] [address], [place], [place] [address]   MRSA Next Gen by PCR, Nasal     Status: None   Collection Time: 05/26/21  4:31 AM   Specimen: Nasal Mucosa; Nasal Swab  Result Value Ref Range Status   MRSA by PCR Next Gen NOT DETECTED NOT DETECTED Final    Comment: (NOTE) The GeneXpert MRSA Assay (FDA approved for NASAL specimens only), is one component of a comprehensive MRSA colonization surveillance program. It is not intended to diagnose MRSA infection nor to guide or monitor treatment for MRSA infections. Test performance is not FDA approved in patients less than 21 years old. Performed at [facility], [place] [address], [place], [place] [address]     Labs: CBC: Recent Labs  Lab 05/29/21 [phone] 05/30/21 0646 05/31/21 0244 06/01/21 0256 06/02/21 0433  WBC 7.7 8.0 8.0 8.6 10.6*  NEUTROABS  --   --  6.4 6.7 8.7*  HGB 9.5* 8.9* 10.9* 8.5*  8.8*  HCT 29.9* 28.7* 35.0* 27.6* 28.5*  MCV 89.8 90.0 90.7 91.4 91.1  PLT 313 268 PLATELET CLUMPS NOTED ON SMEAR, UNABLE TO ESTIMATE 188 191   Basic Metabolic Panel: Recent Labs  Lab 05/29/21 0608 05/29/21 1019 05/29/21 1736 05/30/21 0646 05/30/21 1632 05/31/21 0244  NA 143 143 142 143 140 140  K 4.2 3.5 3.4* 4.6 4.6 4.5  CL 115* 115* 112* 113* 110 110  CO2 21* 21* 23 23 24 23   GLUCOSE 178* 166* 118* 90 114* 100*  BUN 24* 25* 25* 19 21 25*  CREATININE 0.59 0.56 0.45 0.43* 0.47 0.47  CALCIUM 8.6* 8.4* 8.1* 7.9* 7.9* 7.9*  MG 1.7  --  1.9 1.9 1.6* 2.4  PHOS <1.0*  --  2.3* 2.5 2.2* 3.2    CBG: Recent Labs  Lab 06/02/21 2013 06/02/21 2344 06/03/21 0401 06/03/21 0735 06/03/21 1128  GLUCAP 109* 115* 104* 113* 133*    Discharge time spent: 35 minutes.  Signed: Cordelia Poche, MD [facility] 06/03/2021

## 2021-06-03 NOTE — Discharge Instructions (Signed)
[name],  You were in the hospital with leg cellulitis and sepsis. Thankfully you have improved with antibiotics and medicine to keep your blood pressure up. Please follow-up with your PCP and continue wound care as recommended by the wound care nurse. Please ensure adequate nutrition.

## 2021-06-03 NOTE — Progress Notes (Signed)
Nutrition Brief Note  Pt with discharge orders to home today.   PO intake still remains poor.   Interventions:  Provided patient and pt's husband with verbal education on ways to increase calories and protein by oral route post discharge. Provided written handout from [facility]: "High Calorie, High Protein Nutrition Therapy'  Provided patient coupons for Juven, nutrition supplement for wound healing, and pt to continue Juven BID at dischage.   Romelle Starcher MS, RDN, LDN, CNSC Registered Dietitian III Clinical Nutrition RD Pager and On-Call Pager Number Located in [place]

## 2021-06-03 NOTE — TOC Transition Note (Signed)
Transition of Care [facility]) - CM/SW Discharge Note   Patient Details  Name: [name] MRN: [id number] Date of Birth: 06/16/1960  Transition of Care [facility]) CM/SW Contact:  Tom-Johnson, Hershal Coria, RN Phone Number: 06/03/2021, 3:48 PM   Clinical Narrative:     Patient is scheduled for discharge today. Home health resumption of care with [facility]. Cory notified and acceptance voiced. Info on AVS. Patient has necessary DME's at home. Dietary education and information given by Dietician. Denies any other needs. PTAR scheduled for transportation. No further TOC needs noted.    Final next level of care: Home w Home Health Services Barriers to Discharge: Barriers Resolved   Patient Goals and CMS Choice Patient states their goals for this hospitalization and ongoing recovery are:: To return home CMS Medicare.gov Compare Post Acute Care list provided to:: Patient Choice offered to / list presented to : Patient, Spouse  Discharge Placement                Patient to be transferred to facility by: PTAR      Discharge Plan and Services   Discharge Planning Services: CM Consult Post Acute Care Choice: Home Health          DME Arranged: N/A DME Agency: NA       HH Arranged: PT, OT, RN HH Agency: [facility] Date [facility] Agency Contacted: 06/03/21 Time HH Agency Contacted: 1300 Representative spoke with at [facility] Agency: Kandee Keen  Social Determinants of Health (SDOH) Interventions     Readmission Risk Interventions    05/28/2021    2:26 PM  Readmission Risk Prevention Plan  Transportation Screening Complete  PCP or Specialist Appt within 3-5 Days Complete  HRI or Home Care Consult Complete  Social Work Consult for Recovery Care Planning/Counseling Complete  Palliative Care Screening Not Applicable  Medication Review Oceanographer) Referral to Pharmacy

## 2021-06-19 DIAGNOSIS — A419 Sepsis, unspecified organism: Secondary | ICD-10-CM | POA: Diagnosis not present

## 2021-06-19 DIAGNOSIS — J9601 Acute respiratory failure with hypoxia: Secondary | ICD-10-CM | POA: Diagnosis not present

## 2021-06-19 DIAGNOSIS — R5381 Other malaise: Secondary | ICD-10-CM | POA: Diagnosis not present

## 2021-06-19 DIAGNOSIS — R1310 Dysphagia, unspecified: Secondary | ICD-10-CM | POA: Diagnosis not present

## 2021-07-19 DIAGNOSIS — A419 Sepsis, unspecified organism: Secondary | ICD-10-CM | POA: Diagnosis not present

## 2021-07-19 DIAGNOSIS — J9601 Acute respiratory failure with hypoxia: Secondary | ICD-10-CM | POA: Diagnosis not present

## 2021-07-19 DIAGNOSIS — R1310 Dysphagia, unspecified: Secondary | ICD-10-CM | POA: Diagnosis not present

## 2021-07-19 DIAGNOSIS — R5381 Other malaise: Secondary | ICD-10-CM | POA: Diagnosis not present

## 2021-07-27 DIAGNOSIS — J439 Emphysema, unspecified: Secondary | ICD-10-CM | POA: Diagnosis not present

## 2021-07-27 DIAGNOSIS — M459 Ankylosing spondylitis of unspecified sites in spine: Secondary | ICD-10-CM | POA: Diagnosis not present

## 2021-07-27 DIAGNOSIS — I872 Venous insufficiency (chronic) (peripheral): Secondary | ICD-10-CM | POA: Diagnosis not present

## 2021-07-27 DIAGNOSIS — G35 Multiple sclerosis: Secondary | ICD-10-CM | POA: Diagnosis not present

## 2021-08-12 DIAGNOSIS — M545 Low back pain, unspecified: Secondary | ICD-10-CM | POA: Diagnosis not present

## 2021-08-12 DIAGNOSIS — G35 Multiple sclerosis: Secondary | ICD-10-CM | POA: Diagnosis not present

## 2021-08-12 DIAGNOSIS — G2581 Restless legs syndrome: Secondary | ICD-10-CM | POA: Diagnosis not present

## 2021-08-12 DIAGNOSIS — R269 Unspecified abnormalities of gait and mobility: Secondary | ICD-10-CM | POA: Diagnosis not present

## 2021-08-12 DIAGNOSIS — R5383 Other fatigue: Secondary | ICD-10-CM | POA: Diagnosis not present

## 2021-08-18 DIAGNOSIS — Z09 Encounter for follow-up examination after completed treatment for conditions other than malignant neoplasm: Secondary | ICD-10-CM | POA: Diagnosis not present

## 2021-08-18 DIAGNOSIS — S81802A Unspecified open wound, left lower leg, initial encounter: Secondary | ICD-10-CM | POA: Diagnosis not present

## 2021-08-18 DIAGNOSIS — S81801A Unspecified open wound, right lower leg, initial encounter: Secondary | ICD-10-CM | POA: Diagnosis not present

## 2021-08-18 DIAGNOSIS — G35 Multiple sclerosis: Secondary | ICD-10-CM | POA: Diagnosis not present

## 2021-08-18 DIAGNOSIS — J690 Pneumonitis due to inhalation of food and vomit: Secondary | ICD-10-CM | POA: Diagnosis not present

## 2021-08-18 DIAGNOSIS — E639 Nutritional deficiency, unspecified: Secondary | ICD-10-CM | POA: Diagnosis not present

## 2021-08-19 DIAGNOSIS — R5381 Other malaise: Secondary | ICD-10-CM | POA: Diagnosis not present

## 2021-08-19 DIAGNOSIS — J9601 Acute respiratory failure with hypoxia: Secondary | ICD-10-CM | POA: Diagnosis not present

## 2021-08-19 DIAGNOSIS — R1310 Dysphagia, unspecified: Secondary | ICD-10-CM | POA: Diagnosis not present

## 2021-08-19 DIAGNOSIS — A419 Sepsis, unspecified organism: Secondary | ICD-10-CM | POA: Diagnosis not present

## 2021-08-20 DIAGNOSIS — R509 Fever, unspecified: Secondary | ICD-10-CM | POA: Diagnosis not present

## 2021-08-20 DIAGNOSIS — M459 Ankylosing spondylitis of unspecified sites in spine: Secondary | ICD-10-CM | POA: Diagnosis not present

## 2021-08-20 DIAGNOSIS — Z23 Encounter for immunization: Secondary | ICD-10-CM | POA: Diagnosis not present

## 2021-08-20 DIAGNOSIS — L0390 Cellulitis, unspecified: Secondary | ICD-10-CM | POA: Diagnosis not present

## 2021-08-20 DIAGNOSIS — L03116 Cellulitis of left lower limb: Secondary | ICD-10-CM | POA: Diagnosis not present

## 2021-08-20 DIAGNOSIS — F17210 Nicotine dependence, cigarettes, uncomplicated: Secondary | ICD-10-CM | POA: Diagnosis not present

## 2021-08-20 DIAGNOSIS — R600 Localized edema: Secondary | ICD-10-CM | POA: Diagnosis not present

## 2021-08-20 DIAGNOSIS — I739 Peripheral vascular disease, unspecified: Secondary | ICD-10-CM | POA: Diagnosis not present

## 2021-08-20 DIAGNOSIS — Z79899 Other long term (current) drug therapy: Secondary | ICD-10-CM | POA: Diagnosis not present

## 2021-08-20 DIAGNOSIS — R109 Unspecified abdominal pain: Secondary | ICD-10-CM | POA: Diagnosis not present

## 2021-08-20 DIAGNOSIS — Z888 Allergy status to other drugs, medicaments and biological substances status: Secondary | ICD-10-CM | POA: Diagnosis not present

## 2021-08-20 DIAGNOSIS — Z91013 Allergy to seafood: Secondary | ICD-10-CM | POA: Diagnosis not present

## 2021-08-20 DIAGNOSIS — Z792 Long term (current) use of antibiotics: Secondary | ICD-10-CM | POA: Diagnosis not present

## 2021-08-20 DIAGNOSIS — A419 Sepsis, unspecified organism: Secondary | ICD-10-CM | POA: Diagnosis not present

## 2021-08-20 DIAGNOSIS — K3189 Other diseases of stomach and duodenum: Secondary | ICD-10-CM | POA: Diagnosis not present

## 2021-08-20 DIAGNOSIS — G35 Multiple sclerosis: Secondary | ICD-10-CM | POA: Diagnosis not present

## 2021-08-21 DIAGNOSIS — L0390 Cellulitis, unspecified: Secondary | ICD-10-CM | POA: Diagnosis not present

## 2021-08-21 DIAGNOSIS — M459 Ankylosing spondylitis of unspecified sites in spine: Secondary | ICD-10-CM | POA: Diagnosis not present

## 2021-08-21 DIAGNOSIS — R109 Unspecified abdominal pain: Secondary | ICD-10-CM | POA: Diagnosis not present

## 2021-08-21 DIAGNOSIS — K3189 Other diseases of stomach and duodenum: Secondary | ICD-10-CM | POA: Diagnosis not present

## 2021-08-21 DIAGNOSIS — L03116 Cellulitis of left lower limb: Secondary | ICD-10-CM | POA: Diagnosis not present

## 2021-08-21 DIAGNOSIS — G35 Multiple sclerosis: Secondary | ICD-10-CM | POA: Diagnosis not present

## 2021-09-02 DIAGNOSIS — Z13220 Encounter for screening for lipoid disorders: Secondary | ICD-10-CM | POA: Diagnosis not present

## 2021-09-02 DIAGNOSIS — R319 Hematuria, unspecified: Secondary | ICD-10-CM | POA: Diagnosis not present

## 2021-09-02 DIAGNOSIS — E039 Hypothyroidism, unspecified: Secondary | ICD-10-CM | POA: Diagnosis not present

## 2021-09-02 DIAGNOSIS — Z0000 Encounter for general adult medical examination without abnormal findings: Secondary | ICD-10-CM | POA: Diagnosis not present

## 2021-09-02 DIAGNOSIS — E559 Vitamin D deficiency, unspecified: Secondary | ICD-10-CM | POA: Diagnosis not present

## 2021-09-07 ENCOUNTER — Encounter (HOSPITAL_BASED_OUTPATIENT_CLINIC_OR_DEPARTMENT_OTHER): Payer: BC Managed Care – PPO | Attending: Internal Medicine | Admitting: Internal Medicine

## 2021-09-07 DIAGNOSIS — I890 Lymphedema, not elsewhere classified: Secondary | ICD-10-CM | POA: Diagnosis not present

## 2021-09-07 DIAGNOSIS — I87313 Chronic venous hypertension (idiopathic) with ulcer of bilateral lower extremity: Secondary | ICD-10-CM

## 2021-09-07 DIAGNOSIS — J449 Chronic obstructive pulmonary disease, unspecified: Secondary | ICD-10-CM | POA: Diagnosis not present

## 2021-09-07 DIAGNOSIS — F17210 Nicotine dependence, cigarettes, uncomplicated: Secondary | ICD-10-CM | POA: Diagnosis not present

## 2021-09-07 DIAGNOSIS — L97812 Non-pressure chronic ulcer of other part of right lower leg with fat layer exposed: Secondary | ICD-10-CM | POA: Diagnosis not present

## 2021-09-07 DIAGNOSIS — L97822 Non-pressure chronic ulcer of other part of left lower leg with fat layer exposed: Secondary | ICD-10-CM | POA: Diagnosis not present

## 2021-09-07 DIAGNOSIS — G35 Multiple sclerosis: Secondary | ICD-10-CM | POA: Insufficient documentation

## 2021-09-07 NOTE — Progress Notes (Signed)
SELAM, [name] ([id number]) Visit Report for 09/07/2021 Allergy List Details Patient Name: Date of Service: [name], [name] 09/07/2021 9:45 A M Medical Record Number: [id number] Patient Account Number: [id number] Date of Birth/Sex: Treating RN: 05-16-1960 (61 y.o. [name] Primary Care Kelcy Baeten: A [facility] [place] Other Clinician: Referring Cyndel Griffey: Treating Theta Leaf/Extender: Geralyn Corwin [facility] [place] Weeks in Treatment: 0 Allergies Active Allergies Lamictal Reaction: rash cats Reaction: rash Type: Allergen shrimp scampi sauce Type: Food tramadol Reaction: lowers seizure threshold Allergy Notes Electronic Signature(s) Signed: 09/07/2021 5:09:16 PM By: [name] RN, BSN Entered By: [name] on 09/06/2021 17:06:47 -------------------------------------------------------------------------------- Arrival Information Details Patient Name: Date of Service: [name], [name] 09/07/2021 9:45 A M Medical Record Number: [id number] Patient Account Number: [id number] Date of Birth/Sex: Treating RN: [birth year] (61 y.o. [name] Primary Care Brylan Dec: A [facility] [place] Other Clinician: Referring Magaly Pollina: Treating Ajamu Maxon/Extender: Sharin Mons [facility] [place] Weeks in Treatment: 0 Visit Information Patient Arrived: Wheel Chair Arrival Time: 10:02 Accompanied By: husband Transfer Assistance: Manual Patient Identification Verified: Yes Secondary Verification Process Completed: Yes Patient Requires Transmission-Based Precautions: No Patient Has Alerts: Yes Patient Alerts: 5/23 ABI: L1.01 R 1.10 5/23 TBI: L0.75 R 0.90 Electronic Signature(s) Signed: 09/07/2021 5:09:16 PM By: [name] RN, BSN Signed: 09/07/2021 5:09:16 PM By: [name] RN, BSN Entered By: [name] on 09/07/2021 10:54:19 -------------------------------------------------------------------------------- Clinic Level of Care  Assessment Details Patient Name: Date of Service: [name], [name] 09/07/2021 9:45 A M Medical Record Number: [id number] Patient Account Number: [id number] Date of Birth/Sex: Treating RN: 1960/03/19 (61 y.o. [name] Primary Care Vikash Nest: A [facility] [place] Other Clinician: Referring Kain Milosevic: Treating Lucien Budney/Extender: Sharin Mons [facility] [place] Weeks in Treatment: 0 Clinic Level of Care Assessment Items TOOL 1 Quantity Score X- 1 0 Use when EandM and Procedure is performed on INITIAL visit ASSESSMENTS - Nursing Assessment / Reassessment X- 1 20 General Physical Exam (combine w/ comprehensive assessment (listed just below) when performed on new pt. evals) X- 1 25 Comprehensive Assessment (HX, ROS, Risk Assessments, Wounds Hx, etc.) ASSESSMENTS - Wound and Skin Assessment / Reassessment X- 1 10 Dermatologic / Skin Assessment (not related to wound area) ASSESSMENTS - Ostomy and/or Continence Assessment and Care []  - 0 Incontinence Assessment and Management []  - 0 Ostomy Care Assessment and Management (repouching, etc.) PROCESS - Coordination of Care []  - 0 Simple Patient / Family Education for ongoing care X- 1 20 Complex (extensive) Patient / Family Education for ongoing care X- 1 10 Staff obtains , Records, T Results / Process Orders est []  - 0 Staff telephones HHA, Nursing Homes / Clarify orders / etc []  - 0 Routine Transfer to another Facility (non-emergent condition) []  - 0 Routine Hospital Admission (non-emergent condition) X- 1 15 New Admissions / / Ordering NPWT Apligraf, etc. , []  - 0 Emergency Hospital Admission (emergent condition) PROCESS - Special Needs []  - 0 Pediatric / Minor Patient Management []  - 0 Isolation Patient Management []  - 0 Hearing / Language / Visual special needs []  - 0 Assessment of Community assistance (transportation, D/C planning, etc.) []  - 0 Additional  assistance / Altered mentation []  - 0 Support Surface(s) Assessment (bed, cushion, seat, etc.) INTERVENTIONS - Miscellaneous []  - 0 External ear exam []  - 0 Patient Transfer (multiple staff / / Similar devices) []  -  0 Simple Staple / Suture removal (25 or less) []  - 0 Complex Staple / Suture removal (26 or more) []  - 0 Hypo/Hyperglycemic Management (do not check if billed separately) []  - 0 Ankle / Brachial Index (ABI) - do not check if billed separately Has the patient been seen at the hospital within the last three years: Yes Total Score: 100 Level Of Care: New/Established - Level 3 Electronic Signature(s) Signed: 09/07/2021 5:09:16 PM By: RN, BSN Entered By: on 09/07/2021 10:52:32 -------------------------------------------------------------------------------- Encounter Discharge Information Details Patient Name: Date of Service: [name], [name] 09/07/2021 9:45 A M Medical Record Number: [name] Patient Account Number: 09/09/2021 Date of Birth/Sex: Treating RN: [birth year] (61 y.o. [id number] Primary Care Paeton Latouche: A [facility] [place] Other Clinician: Referring Janei Scheff: Treating Nechemia Chiappetta/Extender: 02/08/1960 [facility] [place] Weeks in Treatment: 0 Encounter Discharge Information Items Post Procedure Vitals Discharge Condition: Stable Temperature (F): 97.8 Ambulatory Status: Wheelchair Pulse (bpm): 76 Discharge Destination: Home Respiratory Rate (breaths/min): 20 Transportation: Private Auto Blood Pressure (mmHg): 125/83 Accompanied By: self Schedule Follow-up Appointment: Yes Clinical Summary of Care: Electronic Signature(s) Signed: 09/07/2021 5:09:16 PM By: Nancy Silence RN, BSN Entered By: Sharin Mons on 09/07/2021 10:55:48 -------------------------------------------------------------------------------- Lower Extremity Assessment Details Patient Name: Date of Service: [name], DIA NNE  M. 09/07/2021 9:45 A M Medical Record Number: [name] Patient Account Number: 09/09/2021 Date of Birth/Sex: Treating RN: Aug 01, 1960 (61 y.o. [id number] Primary Care Priseis Cratty: A [facility] [place] Other Clinician: Referring Rubylee Zamarripa: Treating Georgi Navarrete/Extender: 02/08/1960 [facility] [place] Weeks in Treatment: 0 Edema Assessment Assessed: [Left: Yes] [Right: Yes] Edema: [Left: Yes] [Right: Yes] Calf Left: Right: Point of Measurement: 27 cm From Medial Instep 37 cm 42 cm Ankle Left: Right: Point of Measurement: 9 cm From Medial Instep 24 cm 26 cm Knee To Floor Left: Right: From Medial Instep 39 cm Vascular Assessment Pulses: Dorsalis Pedis Palpable: [Left:Yes] Electronic Signature(s) Signed: 09/07/2021 5:09:16 PM By: Nancy Silence RN, BSN Entered By: Sharin Mons on 09/07/2021 10:21:48 -------------------------------------------------------------------------------- Multi Wound Chart Details Patient Name: Date of Service: [name], [name] 09/07/2021 9:45 A M Medical Record Number: [name] Patient Account Number: 09/09/2021 Date of Birth/Sex: Treating RN: Oct 31, 1960 (61 y.o. F) Primary Care Nashaly Dorantes: A [facility] [place] Other Clinician: Referring Jamarian Jacinto: Treating Arbie Reisz/Extender: [id number] [facility] [place] Weeks in Treatment: 0 Vital Signs Height(in): 64 Pulse(bpm): 76 Weight(lbs): 135 Blood Pressure(mmHg): 125/83 Body Mass Index(BMI): 23.2 Temperature(F): 97.8 Respiratory Rate(breaths/min): 20 Photos: [N/A:N/A] Right, Circumferential Lower Leg Left, Circumferential Lower Leg N/A Wound Location: Gradually Appeared Gradually Appeared N/A Wounding Event: Lymphedema Lymphedema N/A Primary Etiology: Anemia, Lymphedema, Pneumothorax, Anemia, Lymphedema, Pneumothorax, N/A Comorbid History: Hypertension, Osteoarthritis, Seizure Hypertension, Osteoarthritis, Seizure Disorder Disorder 06/04/2021 06/04/2021  N/A Date Acquired: 0 0 N/A Weeks of Treatment: Open Open N/A Wound Status: No No N/A Wound Recurrence: Yes Yes N/A Clustered Wound: 3 5 N/A Clustered Quantity: 21x42x0.1 13x13.5x0.2 N/A Measurements L x W x D (cm) 692.721 137.837 N/A A (cm) : rea 69.272 27.567 N/A Volume (cm) : 0.00% 0.00% N/A % Reduction in Area: 0.00% 0.00% N/A % Reduction in Volume: Full Thickness Without Exposed Full Thickness Without Exposed N/A Classification: Support Structures Support Structures Large Large N/A Exudate Amount: Serous Serosanguineous N/A Exudate Type: amber red, brown N/A Exudate Color: Distinct, outline attached Distinct, outline attached N/A Wound Margin: Large (67-100%) Medium (34-66%) N/A Granulation  Amount: Red, Pink Red, Pink N/A Granulation Quality: Small (1-33%) Medium (34-66%) N/A Necrotic Amount: Fat Layer (Subcutaneous Tissue): Yes Fat Layer (Subcutaneous Tissue): Yes N/A Exposed Structures: Fascia: No Fascia: No Tendon: No Tendon: No Muscle: No Muscle: No Joint: No Joint: No Bone: No Bone: No None Small (1-33%) N/A Epithelialization: Chemical/Enzymatic/Mechanical Chemical/Enzymatic/Mechanical N/A Debridement: N/A N/A N/A Instrument: None None N/A Bleeding: Procedure was tolerated well Procedure was tolerated well N/A Debridement Treatment Response: 21x42x0.1 13x13.5x0.2 N/A Post Debridement Measurements L x W x D (cm) 69.272 27.567 N/A Post Debridement Volume: (cm) Debridement Debridement N/A Procedures Performed: Treatment Notes Wound #1 (Lower Leg) Wound Laterality: Right, Circumferential Cleanser Soap and Water Discharge Instruction: May shower and wash wound with dial antibacterial soap and water prior to dressing change. Wound Cleanser Discharge Instruction: Cleanse the wound with wound cleanser prior to applying a clean dressing using gauze sponges, not tissue or cotton balls. Peri-Wound Care Triamcinolone 15 (g) Discharge  Instruction: Use triamcinolone 15 (g) as directed Sween Lotion (Moisturizing lotion) Discharge Instruction: Apply moisturizing lotion as directed Topical Primary Dressing KerraCel Ag Gelling Fiber Dressing, 4x5 in (silver alginate) Discharge Instruction: Apply silver alginate to wound bed as instructed Secondary Dressing ABD Pad, 8x10 Discharge Instruction: Apply over primary dressing as directed. CarboFLEX Odor Control Dressing, 4x4 in Discharge Instruction: Apply over primary dressing as directed. Zetuvit Plus 4x8 in Discharge Instruction: Apply over primary dressing as directed. Secured With Compression Wrap Kerlix Roll 4.5x3.1 (in/yd) Discharge Instruction: Apply Kerlix and Coban compression as directed. Coban Self-Adherent Wrap 4x5 (in/yd) Discharge Instruction: Apply over Kerlix as directed. Compression Stockings Add-Ons Wound #2 (Lower Leg) Wound Laterality: Left, Circumferential Cleanser Soap and Water Discharge Instruction: May shower and wash wound with dial antibacterial soap and water prior to dressing change. Wound Cleanser Discharge Instruction: Cleanse the wound with wound cleanser prior to applying a clean dressing using gauze sponges, not tissue or cotton balls. Peri-Wound Care Triamcinolone 15 (g) Discharge Instruction: Use triamcinolone 15 (g) as directed Sween Lotion (Moisturizing lotion) Discharge Instruction: Apply moisturizing lotion as directed Topical Gentamicin Discharge Instruction: As directed by physician Mupirocin Ointment Discharge Instruction: Apply Mupirocin (Bactroban) as instructed Primary Dressing Maxorb Extra Calcium Alginate Dressing, 4x4 in Discharge Instruction: Apply calcium alginate to wound bed as instructed Secondary Dressing ABD Pad, 8x10 Discharge Instruction: Apply over primary dressing as directed. CarboFLEX Odor Control Dressing, 4x4 in Discharge Instruction: Apply over primary dressing as directed. Zetuvit Plus 4x8  in Discharge Instruction: Apply over primary dressing as directed. Secured With Compression Wrap Kerlix Roll 4.5x3.1 (in/yd) Discharge Instruction: Apply Kerlix and Coban compression as directed. Coban Self-Adherent Wrap 4x5 (in/yd) Discharge Instruction: Apply over Kerlix as directed. Compression Stockings Add-Ons Electronic Signature(s) Signed: 09/07/2021 12:41:14 PM By: Geralyn Corwin DO Entered By: Geralyn Corwin on 09/07/2021 12:07:13 -------------------------------------------------------------------------------- Multi-Disciplinary Care Plan Details Patient Name: Date of Service: [name], [name] 09/07/2021 9:45 A M Medical Record Number: [id number] Patient Account Number: [id number] Date of Birth/Sex: Treating RN: 1960-06-28 (61 y.o. [name] Primary Care Phyllis Whitefield: A [facility] [place] Other Clinician: Referring Tracie Lindbloom: Treating Jarold Macomber/Extender: Sharin Mons [facility] [place] Weeks in Treatment: 0 Active Inactive Orientation to the Wound Care Program Nursing Diagnoses: Knowledge deficit related to the wound healing center program Goals: Patient/caregiver will verbalize understanding of the Wound Healing Center Program Date Initiated: 09/07/2021 Target Resolution Date: 09/17/2021 Goal Status: Active Interventions: Provide education on orientation to the wound center Notes: Pain, Acute or Chronic Nursing Diagnoses: Pain, acute  or chronic: actual or potential Potential alteration in comfort, pain Goals: Patient will verbalize adequate pain control and receive pain control interventions during procedures as needed Date Initiated: 09/07/2021 Target Resolution Date: 09/17/2021 Goal Status: Active Interventions: Encourage patient to take pain medications as prescribed Provide education on pain management Reposition patient for comfort Treatment Activities: Administer pain control measures as ordered : 09/07/2021 Notes: Venous Leg  Ulcer Nursing Diagnoses: Knowledge deficit related to disease process and management Goals: Patient will maintain optimal edema control Date Initiated: 09/07/2021 Target Resolution Date: 09/17/2021 Goal Status: Active Interventions: Assess peripheral edema status every visit. Compression as ordered Treatment Activities: Therapeutic compression applied : 09/07/2021 Notes: Wound/Skin Impairment Nursing Diagnoses: Knowledge deficit related to ulceration/compromised skin integrity Goals: Patient/caregiver will verbalize understanding of skin care regimen Date Initiated: 09/07/2021 Target Resolution Date: 09/16/2021 Goal Status: Active Interventions: Assess patient/caregiver ability to perform ulcer/skin care regimen upon admission and as needed Assess ulceration(s) every visit Provide education on ulcer and skin care Treatment Activities: Skin care regimen initiated : 09/07/2021 Topical wound management initiated : 09/07/2021 Notes: Electronic Signature(s) Signed: 09/07/2021 5:09:16 PM By: [name] RN, BSN Entered By: [name] on 09/07/2021 10:30:54 -------------------------------------------------------------------------------- Pain Assessment Details Patient Name: Date of Service: [name], [name] 09/07/2021 9:45 A M Medical Record Number: [id number] Patient Account Number: [id number] Date of Birth/Sex: Treating RN: [birth year] (61 y.o. [name] Primary Care Suzanna Zahn: A [facility] [place] Other Clinician: Referring Noreen Mackintosh: Treating Ayinde Swim/Extender: Sharin Mons [facility] [place] Weeks in Treatment: 0 Active Problems Location of Pain Severity and Description of Pain Patient Has Paino Yes Site Locations Pain Location: Pain Location: Generalized Pain, Pain in Ulcers Rate the pain. Current Pain Level: 4 Pain Management and Medication Current Pain Management: Medication: No Cold Application: No Rest: No Massage: No Activity:  No T.E.N.S.: No Heat Application: No Leg drop or elevation: No Is the Current Pain Management Adequate: Inadequate How does your wound impact your activities of daily livingo Sleep: No Bathing: No Appetite: No Relationship With Others: No Bladder Continence: No Emotions: No Bowel Continence: No Work: No Toileting: No Drive: No Dressing: No Hobbies: No Psychologist, prison and probation services) Signed: 09/07/2021 5:09:16 PM By: [name] RN, BSN Entered By: [name] on 09/07/2021 10:10:41 -------------------------------------------------------------------------------- Patient/Caregiver Education Details Patient Name: Date of Service: [name], [name] 8/29/2023andnbsp9:45 A M Medical Record Number: [id number] Patient Account Number: [id number] Date of Birth/Gender: Treating RN: [birth year] (61 y.o. [name] Primary Care Physician: A [facility] [place] Other Clinician: Referring Physician: Treating Physician/Extender: Sharin Mons [facility] [place] Weeks in Treatment: 0 Education Assessment Education Provided To: Patient Education Topics Provided Welcome T The [facility]: o Handouts: Welcome T [facility] o Methods: Explain/Verbal Responses: Reinforcements needed Electronic Signature(s) Signed: 09/07/2021 5:09:16 PM By: [name] RN, BSN Entered By: [name] on 09/07/2021 10:31:02 -------------------------------------------------------------------------------- Wound Assessment Details Patient Name: Date of Service: [name], [name] 09/07/2021 9:45 A M Medical Record Number: [id number] Patient Account Number: [id number] Date of Birth/Sex: Treating RN: [birth year] (61 y.o. [name] Primary Care Vernestine Brodhead: A [facility] [place] Other Clinician: Referring Kendalyn Cranfield: Treating Trystan Eads/Extender: Geralyn Corwin A [facility] [place] Weeks in Treatment: 0 Wound Status Wound Number: 1 Primary  Lymphedema Etiology: Wound Location: Right, Circumferential Lower Leg Wound Open Wounding Event: Gradually Appeared Status: Date Acquired: 06/04/2021 Comorbid Anemia, Lymphedema, Pneumothorax, Hypertension, Weeks Of Treatment:  0 History: Osteoarthritis, Seizure Disorder Clustered Wound: Yes Photos Wound Measurements Length: (cm) 21 Width: (cm) 42 Depth: (cm) 0.1 Clustered Quantity: 3 Area: (cm) 692.721 Volume: (cm) 69.272 % Reduction in Area: 0% % Reduction in Volume: 0% Epithelialization: None Tunneling: No Undermining: No Wound Description Classification: Full Thickness Without Exposed Support Structures Wound Margin: Distinct, outline attached Exudate Amount: Large Exudate Type: Serous Exudate Color: amber Foul Odor After Cleansing: No Slough/Fibrino Yes Wound Bed Granulation Amount: Large (67-100%) Exposed Structure Granulation Quality: Red, Pink Fascia Exposed: No Necrotic Amount: Small (1-33%) Fat Layer (Subcutaneous Tissue) Exposed: Yes Necrotic Quality: Adherent Slough Tendon Exposed: No Muscle Exposed: No Joint Exposed: No Bone Exposed: No Treatment Notes Wound #1 (Lower Leg) Wound Laterality: Right, Circumferential Cleanser Soap and Water Discharge Instruction: May shower and wash wound with dial antibacterial soap and water prior to dressing change. Wound Cleanser Discharge Instruction: Cleanse the wound with wound cleanser prior to applying a clean dressing using gauze sponges, not tissue or cotton balls. Peri-Wound Care Triamcinolone 15 (g) Discharge Instruction: Use triamcinolone 15 (g) as directed Sween Lotion (Moisturizing lotion) Discharge Instruction: Apply moisturizing lotion as directed Topical Primary Dressing KerraCel Ag Gelling Fiber Dressing, 4x5 in (silver alginate) Discharge Instruction: Apply silver alginate to wound bed as instructed Secondary Dressing ABD Pad, 8x10 Discharge Instruction: Apply over primary dressing as  directed. CarboFLEX Odor Control Dressing, 4x4 in Discharge Instruction: Apply over primary dressing as directed. Zetuvit Plus 4x8 in Discharge Instruction: Apply over primary dressing as directed. Secured With Compression Wrap Kerlix Roll 4.5x3.1 (in/yd) Discharge Instruction: Apply Kerlix and Coban compression as directed. Coban Self-Adherent Wrap 4x5 (in/yd) Discharge Instruction: Apply over Kerlix as directed. Compression Stockings Add-Ons Electronic Signature(s) Signed: 09/07/2021 5:09:16 PM By: [name] RN, BSN Entered By: [name] on 09/07/2021 10:28:09 -------------------------------------------------------------------------------- Wound Assessment Details Patient Name: Date of Service: [name], [name] 09/07/2021 9:45 A M Medical Record Number: [id number] Patient Account Number: [id number] Date of Birth/Sex: Treating RN: [birth year] (61 y.o. [name], [name] Primary Care Evie Croston: A [facility] [place] Other Clinician: Referring Myquan Schaumburg: Treating Isair Inabinet/Extender: Geralyn Corwin A [facility] [place] Weeks in Treatment: 0 Wound Status Wound Number: 2 Primary Lymphedema Etiology: Wound Location: Left, Circumferential Lower Leg Wound Open Wounding Event: Gradually Appeared Status: Date Acquired: 06/04/2021 Comorbid Anemia, Lymphedema, Pneumothorax, Hypertension, Weeks Of Treatment: 0 History: Osteoarthritis, Seizure Disorder Clustered Wound: Yes Photos Wound Measurements Length: (cm) 13 Width: (cm) 13.5 Depth: (cm) 0.2 Clustered Quantity: 5 Area: (cm) 137.837 Volume: (cm) 27.567 % Reduction in Area: 0% % Reduction in Volume: 0% Epithelialization: Small (1-33%) Tunneling: No Undermining: No Wound Description Classification: Full Thickness Without Exposed Support Structures Wound Margin: Distinct, outline attached Exudate Amount: Large Exudate Type: Serosanguineous Exudate Color: red, brown Foul Odor After Cleansing:  No Slough/Fibrino Yes Wound Bed Granulation Amount: Medium (34-66%) Exposed Structure Granulation Quality: Red, Pink Fascia Exposed: No Necrotic Amount: Medium (34-66%) Fat Layer (Subcutaneous Tissue) Exposed: Yes Necrotic Quality: Adherent Slough Tendon Exposed: No Muscle Exposed: No Joint Exposed: No Bone Exposed: No Treatment Notes Wound #2 (Lower Leg) Wound Laterality: Left, Circumferential Cleanser Soap and Water Discharge Instruction: May shower and wash wound with dial antibacterial soap and water prior to dressing change. Wound Cleanser Discharge Instruction: Cleanse the wound with wound cleanser prior to applying a clean dressing using gauze sponges, not tissue or cotton balls. Peri-Wound Care Triamcinolone 15 (g) Discharge Instruction: Use triamcinolone 15 (g) as directed Sween Lotion (Moisturizing lotion) Discharge Instruction: Apply moisturizing lotion  as directed Topical Gentamicin Discharge Instruction: As directed by physician Mupirocin Ointment Discharge Instruction: Apply Mupirocin (Bactroban) as instructed Primary Dressing Maxorb Extra Calcium Alginate Dressing, 4x4 in Discharge Instruction: Apply calcium alginate to wound bed as instructed Secondary Dressing ABD Pad, 8x10 Discharge Instruction: Apply over primary dressing as directed. CarboFLEX Odor Control Dressing, 4x4 in Discharge Instruction: Apply over primary dressing as directed. Zetuvit Plus 4x8 in Discharge Instruction: Apply over primary dressing as directed. Secured With Compression Wrap Kerlix Roll 4.5x3.1 (in/yd) Discharge Instruction: Apply Kerlix and Coban compression as directed. Coban Self-Adherent Wrap 4x5 (in/yd) Discharge Instruction: Apply over Kerlix as directed. Compression Stockings Add-Ons Electronic Signature(s) Signed: 09/07/2021 5:09:16 PM By: [name] RN, BSN Entered By: [name] on 09/07/2021  10:28:26 -------------------------------------------------------------------------------- Vitals Details Patient Name: Date of Service: [name], [name] 09/07/2021 9:45 A M Medical Record Number: [id number] Patient Account Number: [id number] Date of Birth/Sex: Treating RN: Jun 20, 1960 (61 y.o. [name], [name] Primary Care Darbi Chandran: A [facility] [place] Other Clinician: Referring Heinz Eckert: Treating Lucius Wise/Extender: Geralyn Corwin [facility] [place] Weeks in Treatment: 0 Vital Signs Time Taken: 10:05 Temperature (F): 97.8 Height (in): 64 Pulse (bpm): 76 Source: Stated Respiratory Rate (breaths/min): 20 Weight (lbs): 135 Blood Pressure (mmHg): 125/83 Source: Stated Reference Range: 80 - 120 mg / dl Body Mass Index (BMI): 23.2 Electronic Signature(s) Signed: 09/07/2021 5:09:16 PM By: [name] RN, BSN Entered By: [name] on 09/07/2021 10:15:06

## 2021-09-07 NOTE — Progress Notes (Signed)
[name], [name] ([id number]) Visit Report for 09/07/2021 Abuse Risk Screen Details Patient Name: Date of Service: [name], [name] 09/07/2021 9:45 A M Medical Record Number: [id number] Patient Account Number: [id number] Date of Birth/Sex: Treating RN: 11/15/1960 (61 y.o. [name] Primary Care Caylie Sandquist: A [facility], [place] Other Clinician: Referring Abrham Maslowski: Treating Mauriah Mcmillen/Extender: Sharin Mons [facility] [place] Weeks in Treatment: 0 Abuse Risk Screen Items Answer ABUSE RISK SCREEN: Has anyone close to you tried to hurt or harm you recentlyo No Do you feel uncomfortable with anyone in your familyo No Has anyone forced you do things that you didnt want to doo No Electronic Signature(s) Signed: 09/07/2021 5:09:16 PM By: [name] RN, BSN Entered By: [name] on 09/07/2021 10:09:03 -------------------------------------------------------------------------------- Activities of Daily Living Details Patient Name: Date of Service: [name], Nancy [name] 09/07/2021 9:45 A M Medical Record Number: [id number] Patient Account Number: [id number] Date of Birth/Sex: Treating RN: 01/10/1961 (61 y.o. [name] Primary Care Beauty Pless: A [facility] [place] Other Clinician: Referring Laruen Risser: Treating Hartley Wyke/Extender: Sharin Mons [facility] [place] Weeks in Treatment: 0 Activities of Daily Living Items Answer Activities of Daily Living (Please select one for each item) Drive Automobile Not Able T Medications ake Completely Able Use T elephone Completely Able Care for Appearance Need Assistance Use T oilet Completely Able Bath / Shower Need Assistance Dress Self Need Assistance Feed Self Completely Able Walk Need Assistance Get In / Out Bed Need Assistance Housework Not Able Prepare Meals Need Assistance Handle Money Need Assistance Shop for Self Need Assistance Electronic Signature(s) Signed: 09/07/2021 5:09:16 PM By:  [name] RN, BSN Entered By: [name] on 09/07/2021 10:09:42 -------------------------------------------------------------------------------- Education Screening Details Patient Name: Date of Service: [name], Nancy [name] 09/07/2021 9:45 A M Medical Record Number: [id number] Patient Account Number: [id number] Date of Birth/Sex: Treating RN: Jun 08, 1960 (61 y.o. [name] Primary Care Shamera Yarberry: A [facility] [place] Other Clinician: Referring Cipriano Millikan: Treating Kennidy Lamke/Extender: Sharin Mons [facility] [place] Weeks in Treatment: 0 Primary Learner Assessed: Patient Learning Preferences/Education Level/Primary Language Learning Preference: Explanation, Demonstration, Printed Material Highest Education Level: High School Preferred Language: English Cognitive Barrier Language Barrier: No Translator Needed: No Memory Deficit: No Emotional Barrier: No Cultural/Religious Beliefs Affecting Medical Care: No Physical Barrier Impaired Vision: Yes Glasses, reading Impaired Hearing: No Decreased Hand dexterity: No Knowledge/Comprehension Knowledge Level: High Comprehension Level: High Ability to understand written instructions: High Ability to understand verbal instructions: High Motivation Anxiety Level: Calm Cooperation: Cooperative Education Importance: Acknowledges Need Interest in Health Problems: Asks Questions Perception: Coherent Willingness to Engage in Self-Management High Activities: Readiness to Engage in Self-Management High Activities: Electronic Signature(s) Signed: 09/07/2021 5:09:16 PM By: [name] RN, BSN Entered By: [name] on 09/07/2021 10:10:01 -------------------------------------------------------------------------------- Fall Risk Assessment Details Patient Name: Date of Service: [name], Nancy [name] 09/07/2021 9:45 A M Medical Record Number: [id number] Patient Account Number: [id number] Date of  Birth/Sex: Treating RN: [birth year] (61 y.o. [name], [name] Primary Care Jenni Thew: A [facility] [place] Other Clinician: Referring [name]: Treating [name]/Extender: Sharin Mons [facility] [place] Weeks in Treatment: 0 Fall Risk Assessment Items Have you had 2 or more falls in the last 12 monthso 0 No Have you had any fall that resulted in injury in the last 12 monthso 0 No FALLS RISK SCREEN History of falling - immediate or  within 3 months 25 Yes Secondary diagnosis (Do you have 2 or more medical diagnoseso) 0 No Ambulatory aid None/bed rest/wheelchair/nurse 0 No Crutches/cane/walker 15 Yes Furniture 0 No Intravenous therapy Access/Saline/Heparin Lock 0 No Gait/Transferring Normal/ bed rest/ wheelchair 0 No Weak (short steps with or without shuffle, stooped but able to lift head while walking, may seek 10 Yes support from furniture) Impaired (short steps with shuffle, may have difficulty arising from chair, head down, impaired 0 No balance) Mental Status Oriented to own ability 0 Yes Electronic Signature(s) Signed: 09/07/2021 5:09:16 PM By: [name] RN, BSN Entered By: [name] on 09/07/2021 10:10:10 -------------------------------------------------------------------------------- Foot Assessment Details Patient Name: Date of Service: [name], Nancy [name] 09/07/2021 9:45 A M Medical Record Number: [id number] Patient Account Number: [id number] Date of Birth/Sex: Treating RN: 06-Jun-1960 (61 y.o. [name] Primary Care Ellinore Merced: A [facility] [place] Other Clinician: Referring Becka Lagasse: Treating Kelsey Edman/Extender: Sharin Mons [facility] [place] Weeks in Treatment: 0 Foot Assessment Items Site Locations + = Sensation present, - = Sensation absent, C = Callus, U = Ulcer R = Redness, W = Warmth, M = Maceration, PU = Pre-ulcerative lesion F = Fissure, S = Swelling, D = Dryness Assessment Right: Left: Other Deformity:  No No Prior Foot Ulcer: No No Prior Amputation: No No Charcot Joint: No No Ambulatory Status: Ambulatory With Help Assistance Device: Walker [employer] trader) Signed: 09/07/2021 5:09:16 PM By: [name] RN, BSN Entered By: [name] on 09/07/2021 10:29:52 -------------------------------------------------------------------------------- Nutrition Risk Screening Details Patient Name: Date of Service: [name], Nancy [name] 09/07/2021 9:45 A M Medical Record Number: [id number] Patient Account Number: [id number] Date of Birth/Sex: Treating RN: 01/12/1960 (61 y.o. [name], [name] Primary Care Althia Egolf: A [facility] [place] Other Clinician: Referring Caylor Cerino: Treating Sabri Teal/Extender: Sharin Mons [facility] [place] Weeks in Treatment: 0 Height (in): 64 Weight (lbs): 135 Body Mass Index (BMI): 23.2 Nutrition Risk Screening Items Score Screening NUTRITION RISK SCREEN: I have an illness or condition that made me change the kind and/or amount of food I eat 2 Yes I eat fewer than two meals per day 0 No I eat few fruits and vegetables, or milk products 0 No I have three or more drinks of beer, liquor or wine almost every day 0 No I have tooth or mouth problems that make it hard for me to eat 0 No I don't always have enough money to buy the food I need 0 No I eat alone most of the time 0 No I take three or more different prescribed or over-the-counter drugs a day 1 Yes Without wanting to, I have lost or gained 10 pounds in the last six months 0 No I am not always physically able to shop, cook and/or feed myself 0 No Nutrition Protocols Good Risk Protocol Moderate Risk Protocol 0 Provide education on nutrition High Risk Proctocol Risk Level: Moderate Risk Score: 3 Electronic Signature(s) Signed: 09/07/2021 5:09:16 PM By: [name] RN, BSN Entered By: [name] on 09/07/2021 10:10:18

## 2021-09-07 NOTE — Progress Notes (Signed)
Nancy [name], Nancy [name] ([id number]) Visit Report for 09/07/2021 Chief Complaint Document Details Patient Name: Date of Service: Nancy [name], [name] 09/07/2021 9:45 A M Medical Record Number: [id number] Patient Account Number: [id number] Date of Birth/Sex: Treating RN: 05-15-1960 (61 y.o. F) Primary Care Provider: A [facility], [place] Other Clinician: Referring Provider: Treating Provider/Extender: Britt Bolognese [facility], [place] Nancy [name] in Treatment: 0 Information Obtained from: Patient Chief Complaint 09/07/2021; bilateral lower extremity wounds Electronic Signature(s) Signed: 09/07/2021 12:41:14 PM By: Kalman Shan DO Entered By: Kalman Shan on 09/07/2021 12:08:01 -------------------------------------------------------------------------------- Debridement Details Patient Name: Date of Service: Nancy [name], Nancy [name] 09/07/2021 9:45 A M Medical Record Number: [id number] Patient Account Number: [id number] Date of Birth/Sex: Treating RN: 01/14/1960 (61 y.o. [name] Primary Care Provider: A [facility], [place] Nancy Other Clinician: Referring Provider: Treating Provider/Extender: Britt Bolognese [facility], [place] Nancy [name] in Treatment: 0 Debridement Performed for Assessment: Wound #1 Right,Circumferential Lower Leg Performed By: Physician Kalman Shan, DO Debridement Type: Chemical/Enzymatic/Mechanical Agent Used: gauze and wound cleanser Level of Consciousness (Pre-procedure): Awake and Alert Pre-procedure Verification/Time Out [name] Taken: Bleeding: None Response to Treatment: Procedure was tolerated well Level of Consciousness (Post- Awake and Alert procedure): Post Debridement Measurements of Total Wound Length: (cm) 21 Width: (cm) 42 Depth: (cm) 0.1 Volume: (cm) 69.272 Character of Wound/Ulcer Post Debridement: Stable Post Procedure Diagnosis Same as Pre-procedure Electronic Signature(s) Signed: 09/07/2021 12:41:14 PM By: Kalman Shan  DO Signed: 09/07/2021 5:09:16 PM By: Deon Pilling RN, BSN Entered By: Deon Pilling on 09/07/2021 10:51:51 -------------------------------------------------------------------------------- Debridement Details Patient Name: Date of Service: Nancy [name], Nancy [name] 09/07/2021 9:45 A M Medical Record Number: [id number] Patient Account Number: [id number] Date of Birth/Sex: Treating RN: 1960/08/11 (61 y.o. [name] Primary Care Provider: A [facility], [place] Nancy Other Clinician: Referring Provider: Treating Provider/Extender: Britt Bolognese [facility], [place] Nancy [name] in Treatment: 0 Debridement Performed for Assessment: Wound #2 Left,Circumferential Lower Leg Performed By: Physician Kalman Shan, DO Debridement Type: Chemical/Enzymatic/Mechanical Agent Used: gauze and wound cleanser Level of Consciousness (Pre-procedure): Awake and Alert Pre-procedure Verification/Time Out [name] Taken: Bleeding: None Response to Treatment: Procedure was tolerated well Level of Consciousness (Post- Awake and Alert procedure): Post Debridement Measurements of Total Wound Length: (cm) 13 Width: (cm) 13.5 Depth: (cm) 0.2 Volume: (cm) 27.567 Character of Wound/Ulcer Post Debridement: Stable Post Procedure Diagnosis Same as Pre-procedure Electronic Signature(s) Signed: 09/07/2021 12:41:14 PM By: Kalman Shan DO Signed: 09/07/2021 5:09:16 PM By: Deon Pilling RN, BSN Entered By: Deon Pilling on 09/07/2021 10:52:08 -------------------------------------------------------------------------------- HPI Details Patient Name: Date of Service: Nancy [name], Nancy [name] 09/07/2021 9:45 A M Medical Record Number: [id number] Patient Account Number: [id number] Date of Birth/Sex: Treating RN: 1960/02/11 (61 y.o. F) Primary Care Provider: [facility], [place] Nancy Other Clinician: Referring Provider: Treating Provider/Extender: Britt Bolognese [facility], [place] Nancy [name] in Treatment: 0 History  of Present Illness HPI Description: Admission 09/07/2021 Ms. Nancy [name] is a 61 year old female with a past medical history of COPD, multiple sclerosis and lymphedema that presents the clinic for a 28-monthhistory of nonhealing ulcers to her lower extremities bilaterally. She was recently seen in the ED on 8/11 for left lower cellulitis and given IV antibiotics and discharged on oral Augmentin. She completed this on 8/19. Patient does not wear compression stockings. She does not have a history of wounds previously. She is not on a diuretic. Electronic  Signature(s) Signed: 09/07/2021 12:41:14 PM By: Kalman Shan DO Entered By: Kalman Shan on 09/07/2021 12:10:18 -------------------------------------------------------------------------------- Physical Exam Details Patient Name: Date of Service: Nancy [name], Nancy [name] 09/07/2021 9:45 A M Medical Record Number: [id number] Patient Account Number: [id number] Date of Birth/Sex: Treating RN: May 20, 1960 (62 y.o. F) Primary Care Provider: [facility], [place] Nancy Other Clinician: Referring Provider: Treating Provider/Extender: Kalman Shan [facility], [place] Nancy [name] in Treatment: 0 Constitutional respirations regular, non-labored and within target range for patient.. Cardiovascular 2+ dorsalis pedis/posterior tibialis pulses. Psychiatric pleasant and cooperative. Notes Right lower extremity: 3+ pitting edema to the knee. Cobblestoning skin changes. Weeping throughout. [name] increased warmth or purulent drainage noted. Left lower extremity: Scattered open wounds with granulation tissue and nonviable tissue. 3+ pitting edema to the knee. [name] increased warmth or purulent drainage noted. Venous stasis dermatitis. Electronic Signature(s) Signed: 09/07/2021 12:41:14 PM By: Kalman Shan DO Entered By: Kalman Shan on 09/07/2021 12:11:49 -------------------------------------------------------------------------------- Physician  Orders Details Patient Name: Date of Service: Nancy [name], Nancy [name] 09/07/2021 9:45 A M Medical Record Number: [id number] Patient Account Number: [id number] Date of Birth/Sex: Treating RN: [birth year] (61 y.o. [name] Primary Care Provider: A [facility], [place] Nancy Other Clinician: Referring Provider: Treating Provider/Extender: Britt Bolognese [facility], [place] Nancy [name] in Treatment: 0 Verbal / Phone Orders: [name] Diagnosis Coding ICD-10 Coding Code Description I89.0 Lymphedema, not elsewhere classified Follow-up Appointments ppointment in 1 week. - Dr. Heber [place] and Tammi Klippel, Room 8 Thursday Return A Anesthetic Wound #1 Right,Circumferential Lower Leg (In clinic) Topical Lidocaine 4% applied to wound bed Wound #2 Left,Circumferential Lower Leg (In clinic) Topical Lidocaine 4% applied to wound bed Bathing/ Shower/ Hygiene May shower with protection but do not get wound dressing(s) wet. Edema Control - Lymphedema / SCD / Other Elevate legs to the level of the heart or above for 30 minutes daily and/or when sitting, a frequency of: - 3-4 times a day throughout the day. Avoid standing for long periods of time. Exercise regularly Wound Treatment Wound #1 - Lower Leg Wound Laterality: Right, Circumferential Cleanser: Soap and Water 1 x Per Week/30 Days Discharge Instructions: May shower and wash wound with dial antibacterial soap and water prior to dressing change. Cleanser: Wound Cleanser 1 x Per Week/30 Days Discharge Instructions: Cleanse the wound with wound cleanser prior to applying a clean dressing using gauze sponges, not tissue or cotton balls. Peri-Wound Care: Triamcinolone 15 (g) 1 x Per Week/30 Days Discharge Instructions: Use triamcinolone 15 (g) as directed Peri-Wound Care: Sween Lotion (Moisturizing lotion) 1 x Per Week/30 Days Discharge Instructions: Apply moisturizing lotion as directed Prim Dressing: KerraCel Ag Gelling Fiber Dressing, 4x5 in (silver  alginate) 1 x Per Week/30 Days ary Discharge Instructions: Apply silver alginate to wound bed as instructed Secondary Dressing: ABD Pad, 8x10 1 x Per Week/30 Days Discharge Instructions: Apply over primary dressing as directed. Secondary Dressing: CarboFLEX Odor Control Dressing, 4x4 in 1 x Per Week/30 Days Discharge Instructions: Apply over primary dressing as directed. Secondary Dressing: Zetuvit Plus 4x8 in 1 x Per Week/30 Days Discharge Instructions: Apply over primary dressing as directed. Compression Wrap: Kerlix Roll 4.5x3.1 (in/yd) 1 x Per Week/30 Days Discharge Instructions: Apply Kerlix and Coban compression as directed. Compression Wrap: Coban Self-Adherent Wrap 4x5 (in/yd) 1 x Per Week/30 Days Discharge Instructions: Apply over Kerlix as directed. Wound #2 - Lower Leg Wound Laterality: Left, Circumferential Cleanser: Soap and Water 1 x Per Week/30 Days Discharge Instructions:  May shower and wash wound with dial antibacterial soap and water prior to dressing change. Cleanser: Wound Cleanser 1 x Per Week/30 Days Discharge Instructions: Cleanse the wound with wound cleanser prior to applying a clean dressing using gauze sponges, not tissue or cotton balls. Peri-Wound Care: Triamcinolone 15 (g) 1 x Per Week/30 Days Discharge Instructions: Use triamcinolone 15 (g) as directed Peri-Wound Care: Sween Lotion (Moisturizing lotion) 1 x Per Week/30 Days Discharge Instructions: Apply moisturizing lotion as directed Topical: Gentamicin 1 x Per Week/30 Days Discharge Instructions: As directed by physician Topical: Mupirocin Ointment 1 x Per Week/30 Days Discharge Instructions: Apply Mupirocin (Bactroban) as instructed Prim Dressing: Maxorb Extra Calcium Alginate Dressing, 4x4 in 1 x Per Week/30 Days ary Discharge Instructions: Apply calcium alginate to wound bed as instructed Secondary Dressing: ABD Pad, 8x10 1 x Per Week/30 Days Discharge Instructions: Apply over primary dressing as  directed. Secondary Dressing: CarboFLEX Odor Control Dressing, 4x4 in 1 x Per Week/30 Days Discharge Instructions: Apply over primary dressing as directed. Secondary Dressing: Zetuvit Plus 4x8 in 1 x Per Week/30 Days Discharge Instructions: Apply over primary dressing as directed. Compression Wrap: Kerlix Roll 4.5x3.1 (in/yd) 1 x Per Week/30 Days Discharge Instructions: Apply Kerlix and Coban compression as directed. Compression Wrap: Coban Self-Adherent Wrap 4x5 (in/yd) 1 x Per Week/30 Days Discharge Instructions: Apply over Kerlix as directed. Patient Medications llergies: Lamictal, cats, shrimp scampi sauce, tramadol A Notifications Medication Indication Start End 09/07/2021 lidocaine DOSE topical 4 % gel - gel topical applied only in clinic for any debridements by provider. Electronic Signature(s) Signed: 09/07/2021 12:41:14 PM By: Kalman Shan DO Entered By: Kalman Shan on 09/07/2021 12:11:58 -------------------------------------------------------------------------------- Problem List Details Patient Name: Date of Service: Nancy [name], Nancy [name] 09/07/2021 9:45 A M Medical Record Number: [id number] Patient Account Number: [id number] Date of Birth/Sex: Treating RN: 06-20-1960 (61 y.o. 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/07/2021 [name] Yes exposed L97.822 Non-pressure chronic ulcer of other part of left lower leg with fat layer exposed8/29/2023 [name] Yes I89.0 Lymphedema, not elsewhere classified 09/07/2021 [name] Yes I87.313 Chronic venous hypertension (idiopathic) with ulcer of bilateral lower extremity 09/07/2021 [name] Yes J44.9 Chronic obstructive pulmonary  disease, unspecified 09/07/2021 [name] Yes G35 Multiple sclerosis 09/07/2021 [name] Yes Inactive Problems Resolved Problems Electronic Signature(s) Signed: 09/07/2021 12:41:14 PM By: Kalman Shan DO Entered By: Kalman Shan on 09/07/2021 12:07:03 -------------------------------------------------------------------------------- Progress Note Details Patient Name: Date of Service: Nancy [name], Nancy [name] 09/07/2021 9:45 A M Medical Record Number: [id number] Patient Account Number: [id number] Date of Birth/Sex: Treating RN: October 29, 1960 (61 y.o. F) Primary Care Provider: [facility], [place] Other Clinician: Referring Provider: Treating Provider/Extender: Britt Bolognese [facility], [place] Nancy [name] in Treatment: 0 Subjective Chief Complaint Information obtained from Patient 09/07/2021; bilateral lower extremity wounds History of Present Illness (HPI) Admission 09/07/2021 Ms. Nancy [name] is a 61 year old female with a past medical history of COPD, multiple sclerosis and lymphedema that presents the clinic for a 45-monthhistory of nonhealing ulcers to her lower extremities bilaterally. She was recently seen in the ED on 8/11 for left lower cellulitis and given IV antibiotics and discharged on oral Augmentin. She completed this on 8/19. Patient does not wear compression  stockings. She does not have a history of wounds previously. She is not on a diuretic. Patient History Information obtained from Patient, Chart. Allergies Lamictal (Reaction: rash), cats (Reaction: rash), shrimp scampi sauce, tramadol (Reaction: lowers seizure threshold) Family History Cancer - Maternal Grandparents, Diabetes - Maternal Grandparents, Heart Disease - Father. Social History Current every day smoker - 1ppd, Marital Status - Married, Alcohol Use - Never, Drug Use - [name] History, Caffeine Use - Never. Medical History Hematologic/Lymphatic Patient has history of Anemia, Lymphedema Respiratory Patient has  history of Pneumothorax - bilateral Cardiovascular Patient has history of Hypertension Musculoskeletal Patient has history of Osteoarthritis Neurologic Patient has history of Seizure Disorder Hospitalization/Surgery History - 06/2013 ORIF left toe. - bladder surgery 2007. - appendectomy. Medical A Surgical History Notes nd Constitutional Symptoms (General Health) HLA B27 positive Respiratory pulmonary nodules Gastrointestinal GERD IBS Genitourinary UTI Musculoskeletal MS Objective Constitutional respirations regular, non-labored and within target range for patient.. Vitals Time Taken: 10:05 AM, Height: 64 in, Source: Stated, Weight: 135 lbs, Source: Stated, BMI: 23.2, Temperature: 97.8 F, Pulse: 76 bpm, Respiratory Rate: 20 breaths/min, Blood Pressure: 125/83 mmHg. Cardiovascular 2+ dorsalis pedis/posterior tibialis pulses. Psychiatric pleasant and cooperative. General Notes: Right lower extremity: 3+ pitting edema to the knee. Cobblestoning skin changes. Weeping throughout. [name] increased warmth or purulent drainage noted. Left lower extremity: Scattered open wounds with granulation tissue and nonviable tissue. 3+ pitting edema to the knee. [name] increased warmth or purulent drainage noted. Venous stasis dermatitis. Integumentary (Hair, Skin) Wound #1 status is Open. Original cause of wound was Gradually Appeared. The date acquired was: 06/04/2021. The wound is located on the Right,Circumferential Lower Leg. The wound measures 21cm length x 42cm width x 0.1cm depth; 692.721cm^2 area and 69.272cm^3 volume. There is Fat Layer (Subcutaneous Tissue) exposed. There is [name] tunneling or undermining noted. There is a large amount of serous drainage noted. The wound margin is distinct with the outline attached to the wound base. There is large (67-100%) red, pink granulation within the wound bed. There is a small (1-33%) amount of necrotic tissue within the wound bed including Adherent  Slough. Wound #2 status is Open. Original cause of wound was Gradually Appeared. The date acquired was: 06/04/2021. The wound is located on the Left,Circumferential Lower Leg. The wound measures 13cm length x 13.5cm width x 0.2cm depth; 137.837cm^2 area and 27.567cm^3 volume. There is Fat Layer (Subcutaneous Tissue) exposed. There is [name] tunneling or undermining noted. There is a large amount of serosanguineous drainage noted. The wound margin is distinct with the outline attached to the wound base. There is medium (34-66%) red, pink granulation within the wound bed. There is a medium (34-66%) amount of necrotic tissue within the wound bed including Adherent Slough. Assessment Active Problems ICD-10 Non-pressure chronic ulcer of other part of right lower leg with fat layer exposed Non-pressure chronic ulcer of other part of left lower leg with fat layer exposed Lymphedema, not elsewhere classified Chronic venous hypertension (idiopathic) with ulcer of bilateral lower extremity Chronic obstructive pulmonary disease, unspecified Multiple sclerosis Patient presents with a 4-monthhistory of nonhealing ulcers to her legs bilaterally in the setting of lymphedema. She has been treated for cellulitis and completed antibiotics 1 to 2 [name] ago. T oday there are [name] signs of infection. Her ABIs were completed on 05/31/2021 and was 1.1 on the right and 1.01 on the left with multiphasic waveforms throughout. She has adequate blood flow for healing. At this time I recommended antibiotic ointment to the left lower extremity  as there appears to be significant bioburden along with calcium alginate. T the right leg I recommended silver alginate. We will start with o Kerlix/Coban and if she tolerates this well we will continue to move to a 3 layer compression. She would likely benefit from a diuretic and I recommended she talk with her primary care physician about this. We will order compression Velcro wraps at next  clinic visit. Procedures Wound #1 Pre-procedure diagnosis of Wound #1 is a Lymphedema located on the Right,Circumferential Lower Leg . There was a Chemical/Enzymatic/Mechanical debridement performed by Kalman Shan, DO.. Other agent used was gauze and wound cleanser. There was [name] bleeding. The procedure was tolerated well. Post Debridement Measurements: 21cm length x 42cm width x 0.1cm depth; 69.272cm^3 volume. Character of Wound/Ulcer Post Debridement is stable. Post procedure Diagnosis Wound #1: Same as Pre-Procedure Wound #2 Pre-procedure diagnosis of Wound #2 is a Lymphedema located on the Left,Circumferential Lower Leg . There was a Chemical/Enzymatic/Mechanical debridement performed by Kalman Shan, DO.. Other agent used was gauze and wound cleanser. There was [name] bleeding. The procedure was tolerated well. Post Debridement Measurements: 13cm length x 13.5cm width x 0.2cm depth; 27.567cm^3 volume. Character of Wound/Ulcer Post Debridement is stable. Post procedure Diagnosis Wound #2: Same as Pre-Procedure Plan Follow-up Appointments: Return Appointment in 1 week. - Dr. Heber [place] and Tammi Klippel, Room 8 Thursday Anesthetic: Wound #1 Right,Circumferential Lower Leg: (In clinic) Topical Lidocaine 4% applied to wound bed Wound #2 Left,Circumferential Lower Leg: (In clinic) Topical Lidocaine 4% applied to wound bed Bathing/ Shower/ Hygiene: May shower with protection but do not get wound dressing(s) wet. Edema Control - Lymphedema / SCD / Other: Elevate legs to the level of the heart or above for 30 minutes daily and/or when sitting, a frequency of: - 3-4 times a day throughout the day. Avoid standing for long periods of time. Exercise regularly The following medication(s) was prescribed: lidocaine topical 4 % gel gel topical applied only in clinic for any debridements by provider. was prescribed at facility WOUND #1: - Lower Leg Wound Laterality: Right, Circumferential Cleanser: Soap  and Water 1 x Per Week/30 Days Discharge Instructions: May shower and wash wound with dial antibacterial soap and water prior to dressing change. Cleanser: Wound Cleanser 1 x Per Week/30 Days Discharge Instructions: Cleanse the wound with wound cleanser prior to applying a clean dressing using gauze sponges, not tissue or cotton balls. Peri-Wound Care: Triamcinolone 15 (g) 1 x Per Week/30 Days Discharge Instructions: Use triamcinolone 15 (g) as directed Peri-Wound Care: Sween Lotion (Moisturizing lotion) 1 x Per Week/30 Days Discharge Instructions: Apply moisturizing lotion as directed Prim Dressing: KerraCel Ag Gelling Fiber Dressing, 4x5 in (silver alginate) 1 x Per Week/30 Days ary Discharge Instructions: Apply silver alginate to wound bed as instructed Secondary Dressing: ABD Pad, 8x10 1 x Per Week/30 Days Discharge Instructions: Apply over primary dressing as directed. Secondary Dressing: CarboFLEX Odor Control Dressing, 4x4 in 1 x Per Week/30 Days Discharge Instructions: Apply over primary dressing as directed. Secondary Dressing: Zetuvit Plus 4x8 in 1 x Per Week/30 Days Discharge Instructions: Apply over primary dressing as directed. Com pression Wrap: Kerlix Roll 4.5x3.1 (in/yd) 1 x Per Week/30 Days Discharge Instructions: Apply Kerlix and Coban compression as directed. Com pression Wrap: Coban Self-Adherent Wrap 4x5 (in/yd) 1 x Per Week/30 Days Discharge Instructions: Apply over Kerlix as directed. WOUND #2: - Lower Leg Wound Laterality: Left, Circumferential Cleanser: Soap and Water 1 x Per Week/30 Days Discharge Instructions: May shower and wash wound with dial  antibacterial soap and water prior to dressing change. Cleanser: Wound Cleanser 1 x Per Week/30 Days Discharge Instructions: Cleanse the wound with wound cleanser prior to applying a clean dressing using gauze sponges, not tissue or cotton balls. Peri-Wound Care: Triamcinolone 15 (g) 1 x Per Week/30 Days Discharge  Instructions: Use triamcinolone 15 (g) as directed Peri-Wound Care: Sween Lotion (Moisturizing lotion) 1 x Per Week/30 Days Discharge Instructions: Apply moisturizing lotion as directed Topical: Gentamicin 1 x Per Week/30 Days Discharge Instructions: As directed by physician Topical: Mupirocin Ointment 1 x Per Week/30 Days Discharge Instructions: Apply Mupirocin (Bactroban) as instructed Prim Dressing: Maxorb Extra Calcium Alginate Dressing, 4x4 in 1 x Per Week/30 Days ary Discharge Instructions: Apply calcium alginate to wound bed as instructed Secondary Dressing: ABD Pad, 8x10 1 x Per Week/30 Days Discharge Instructions: Apply over primary dressing as directed. Secondary Dressing: CarboFLEX Odor Control Dressing, 4x4 in 1 x Per Week/30 Days Discharge Instructions: Apply over primary dressing as directed. Secondary Dressing: Zetuvit Plus 4x8 in 1 x Per Week/30 Days Discharge Instructions: Apply over primary dressing as directed. Com pression Wrap: Kerlix Roll 4.5x3.1 (in/yd) 1 x Per Week/30 Days Discharge Instructions: Apply Kerlix and Coban compression as directed. Com pression Wrap: Coban Self-Adherent Wrap 4x5 (in/yd) 1 x Per Week/30 Days Discharge Instructions: Apply over Kerlix as directed. 1. Antibiotic ointment 2. Silver alginate 3. Calcium alginate 4. Kerlix/Coban 5. Follow-up in 1 week Electronic Signature(s) Signed: 09/07/2021 12:41:14 PM By: Kalman Shan DO Entered By: Kalman Shan on 09/07/2021 12:15:10 -------------------------------------------------------------------------------- HxROS Details Patient Name: Date of Service: Nancy [name], Nancy [name] 09/07/2021 9:45 A M Medical Record Number: [id number] Patient Account Number: [id number] Date of Birth/Sex: Treating RN: April 29, 1960 (61 y.o. [name] Primary Care Provider: [facility], [place] Nancy Other Clinician: Referring Provider: Treating Provider/Extender: Britt Bolognese [facility], [place]  Nancy [name] in Treatment: 0 Information Obtained From Patient Chart Constitutional Symptoms (General Health) Medical History: Past Medical History Notes: HLA B27 positive Hematologic/Lymphatic Medical History: Positive for: Anemia; Lymphedema Respiratory Medical History: Positive for: Pneumothorax - bilateral Past Medical History Notes: pulmonary nodules Cardiovascular Medical History: Positive for: Hypertension Gastrointestinal Medical History: Past Medical History Notes: GERD IBS Genitourinary Medical History: Past Medical History Notes: UTI Musculoskeletal Medical History: Positive for: Osteoarthritis Past Medical History Notes: MS Neurologic Medical History: Positive for: Seizure Disorder Immunizations Pneumococcal Vaccine: Received Pneumococcal Vaccination: [name] Implantable Devices [name] devices added Hospitalization / Surgery History Type of Hospitalization/Surgery 06/2013 ORIF left toe bladder surgery 2007 appendectomy Family and Social History Cancer: Yes - Maternal Grandparents; Diabetes: Yes - Maternal Grandparents; Heart Disease: Yes - Father; Current every day smoker - 1ppd; Marital Status - Married; Alcohol Use: Never; Drug Use: [name] History; Caffeine Use: Never; Financial Concerns: [name]; Food, Clothing or Shelter Needs: [name]; Support System Lacking: [name]; Transportation Concerns: [name] Electronic Signature(s) Signed: 09/07/2021 12:41:14 PM By: Kalman Shan DO Signed: 09/07/2021 5:09:16 PM By: Deon Pilling RN, BSN Entered By: Deon Pilling on 09/07/2021 10:13:31 -------------------------------------------------------------------------------- SuperBill Details Patient Name: Date of Service: Nancy [name], Nancy [name] 09/07/2021 Medical Record Number: [id number] Patient Account Number: [id number] Date of Birth/Sex: Treating RN: Nov 05, 1960 (61 y.o. 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 I89.0 Lymphedema, not elsewhere classified I87.313 Chronic venous hypertension (idiopathic) with ulcer of bilateral lower extremity J44.9 Chronic obstructive pulmonary disease, unspecified G35 Multiple sclerosis Facility Procedures CPT4 Code: 56943700 Description: [place] VISIT-LEV 3 EST PT Modifier: Quantity: 1 CPT4 Code: 52591028 Description: 90228 - DEBRIDE W/O ANES NON SELECT Modifier: Quantity: 1 Physician Procedures : CPT4 Code Description Modifier [id number] - WC PHYS LEVEL 4 - NEW PT ICD-10 Diagnosis Description P54.301 Non-pressure chronic ulcer of other part of right lower leg with fat layer exposed L97.822 Non-pressure chronic ulcer of other part of left  lower leg with fat layer exposed I89.0 Lymphedema, not elsewhere classified I87.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

## 2021-09-09 DIAGNOSIS — E041 Nontoxic single thyroid nodule: Secondary | ICD-10-CM | POA: Diagnosis not present

## 2021-09-09 DIAGNOSIS — B3731 Acute candidiasis of vulva and vagina: Secondary | ICD-10-CM | POA: Diagnosis not present

## 2021-09-09 DIAGNOSIS — Z0000 Encounter for general adult medical examination without abnormal findings: Secondary | ICD-10-CM | POA: Diagnosis not present

## 2021-09-09 DIAGNOSIS — D649 Anemia, unspecified: Secondary | ICD-10-CM | POA: Diagnosis not present

## 2021-09-09 DIAGNOSIS — R9389 Abnormal findings on diagnostic imaging of other specified body structures: Secondary | ICD-10-CM | POA: Diagnosis not present

## 2021-09-15 ENCOUNTER — Other Ambulatory Visit: Payer: Self-pay | Admitting: Registered Nurse

## 2021-09-15 DIAGNOSIS — N63 Unspecified lump in unspecified breast: Secondary | ICD-10-CM

## 2021-09-16 ENCOUNTER — Encounter (HOSPITAL_BASED_OUTPATIENT_CLINIC_OR_DEPARTMENT_OTHER): Payer: BC Managed Care – PPO | Attending: Internal Medicine | Admitting: Internal Medicine

## 2021-09-16 DIAGNOSIS — L97822 Non-pressure chronic ulcer of other part of left lower leg with fat layer exposed: Secondary | ICD-10-CM | POA: Insufficient documentation

## 2021-09-16 DIAGNOSIS — I87313 Chronic venous hypertension (idiopathic) with ulcer of bilateral lower extremity: Secondary | ICD-10-CM | POA: Diagnosis not present

## 2021-09-16 DIAGNOSIS — G35 Multiple sclerosis: Secondary | ICD-10-CM | POA: Diagnosis not present

## 2021-09-16 DIAGNOSIS — I890 Lymphedema, not elsewhere classified: Secondary | ICD-10-CM | POA: Insufficient documentation

## 2021-09-16 DIAGNOSIS — J449 Chronic obstructive pulmonary disease, unspecified: Secondary | ICD-10-CM | POA: Insufficient documentation

## 2021-09-16 DIAGNOSIS — L97812 Non-pressure chronic ulcer of other part of right lower leg with fat layer exposed: Secondary | ICD-10-CM | POA: Diagnosis not present

## 2021-09-16 NOTE — Progress Notes (Signed)
[name] ([id number]) Visit Report for 09/16/2021 Chief Complaint Document Details Patient Name: Date of Service: Nancy [place] [name], [place] Nancy M. 09/16/2021 2:15 PM Medical Record Number: [id number] Patient Account Number: [id number] Date of Birth/Sex: Treating RN: 1960/06/22 (61 y.o. F) Primary Care Provider: A [facility], [place] Other Clinician: Referring Provider: Treating Provider/Extender: Britt Bolognese [facility], [place] Nancy Weeks in Treatment: 1 Information Obtained from: Patient Chief Complaint 09/07/2021; bilateral lower extremity wounds Electronic Signature(s) Signed: 09/16/2021 3:54:57 PM By: Kalman Shan DO Entered By: Kalman Shan on 09/16/2021 15:26:51 -------------------------------------------------------------------------------- HPI Details Patient Name: Date of Service: [name], Nancy Nancy M. 09/16/2021 2:15 PM Medical Record Number: [id number] Patient Account Number: [id number] Date of Birth/Sex: Treating RN: [birth year] (61 y.o. F) Primary Care Provider: A [facility], [place] Nancy Other Clinician: Referring Provider: Treating Provider/Extender: Britt Bolognese [facility], [place] Nancy Weeks in Treatment: 1 History of Present Illness HPI Description: Admission 09/07/2021 [name] is a 61 year old female with a past medical history of COPD, multiple sclerosis and lymphedema that presents the clinic for a 66-monthhistory of nonhealing ulcers to her lower extremities bilaterally. She was recently seen in the ED on 8/11 for left lower cellulitis and given IV antibiotics and discharged on oral Augmentin. She completed this on 8/19. Patient does not wear compression stockings. She does not have a history of wounds previously. She is not on a diuretic. 9/7; patient presents for follow-up. She states that she needed to take the wraps off yesterday due to drainage coming through. We have been using silver alginate to the right lower extremity wound and  gentamicin and mupirocin ointment with calcium alginate to the left lower extremity wound all under Kerlix/Coban. She currently denies signs of infection. Electronic Signature(s) Signed: 09/16/2021 3:54:57 PM By: HKalman ShanDO Entered By: HKalman Shanon 09/16/2021 15:28:08 -------------------------------------------------------------------------------- Physical Exam Details Patient Name: Date of Service: [name], Nancy Nancy M. 09/16/2021 2:15 PM Medical Record Number: [id number] Account Number: [id number] of Birth/Sex: Treating RN: 116-Jun-1962(61y.o. F) Primary Care Provider: A [facility], [place] Nancy Other Clinician: Referring Provider: Treating Provider/Extender: HKalman ShanA [facility], [place] Nancy Weeks in Treatment: 1 Constitutional respirations regular, non-labored and within target range for patient.. Cardiovascular 2+ dorsalis pedis/posterior tibialis pulses. Psychiatric pleasant and cooperative. Notes Right lower extremity: 3+ pitting edema to the knee. Cobblestoning skin changes. Weeping throughout. [name] increased warmth or purulent drainage noted. Left lower extremity: Scattered open wounds with granulation tissue and nonviable tissue. 3+ pitting edema to the knee. [name] increased warmth or purulent drainage noted. Venous stasis dermatitis. Electronic Signature(s) Signed: 09/16/2021 3:54:57 PM By: HKalman ShanDO Entered By: HKalman Shanon 09/16/2021 15:28:35 -------------------------------------------------------------------------------- Physician Orders Details Patient Name: Date of Service: [name], Nancy Nancy M. 09/16/2021 2:15 PM Medical Record Number: [id number] Account Number: [id number] of Birth/Sex: Treating RN: [birth year](61y.o. 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 - Dr. Heber [place] and [facility] [place], Room 8 Monday or Tuesday Return A Nurse Visit: - Thursday overflow Room 6 Anesthetic Wound #1 Right,Circumferential Lower Leg (In clinic) Topical Lidocaine 4% applied to wound bed Wound #2 Left,Circumferential Lower Leg (In clinic) Topical Lidocaine 4% applied to wound bed Bathing/ Shower/ Hygiene May shower with protection but do not get wound dressing(s) wet. Edema Control - Lymphedema / SCD / Other Elevate legs to the level of the heart or above for 30 minutes daily and/or when sitting, a frequency of: - 3-4 times a day throughout the day. Avoid standing for long periods of time. Exercise regularly Wound Treatment Wound #1 - Lower Leg Wound Laterality: Right, Circumferential Cleanser: Soap and Water 2 x Per Week/30 Days Discharge Instructions: May shower and wash wound with dial antibacterial soap and water prior to dressing change. Cleanser: Wound Cleanser 2 x Per Week/30 Days Discharge Instructions: Cleanse the wound with wound cleanser prior to applying a clean dressing using gauze sponges, not tissue or cotton balls. Peri-Wound Care: Triamcinolone 15 (g) 2 x Per Week/30 Days Discharge Instructions: Use triamcinolone 15 (g) as directed Peri-Wound Care: Zinc Oxide Ointment 30g tube 2 x Per Week/30 Days Discharge Instructions: Apply Zinc Oxide to periwound with each dressing change Peri-Wound Care: Sween Lotion (Moisturizing lotion) 2 x  Per Week/30 Days Discharge Instructions: Apply moisturizing lotion as directed Prim Dressing: KerraCel Ag Gelling Fiber Dressing, 4x5 in (silver alginate) 2 x Per Week/30 Days ary Discharge Instructions: Apply silver alginate to wound bed as instructed Secondary Dressing: ABD Pad, 8x10 2 x Per Week/30 Days Discharge Instructions: Apply over primary dressing as directed. Secondary Dressing: CarboFLEX Odor Control Dressing, 4x4 in 2 x Per Week/30 Days Discharge Instructions: Apply over primary dressing as directed. Secondary Dressing: Zetuvit Plus 4x8 in 2 x Per Week/30 Days Discharge Instructions: Apply over primary dressing as directed. Compression Wrap: ThreePress (3 layer compression wrap) 2 x Per Week/30 Days Discharge Instructions: Apply three layer compression as directed. Wound #2 - Lower Leg Wound Laterality: Left, Circumferential Cleanser: Soap and Water 1 x Per Week/30 Days Discharge Instructions: May shower and wash wound with dial antibacterial soap and water prior to dressing change. Cleanser: Wound Cleanser 1 x Per Week/30 Days Discharge Instructions: Cleanse the wound with wound cleanser prior to applying a clean dressing using gauze sponges, not tissue or cotton balls. Peri-Wound Care: Triamcinolone 15 (g) 1 x Per Week/30 Days Discharge Instructions: Use triamcinolone 15 (g) as directed Peri-Wound Care: Zinc Oxide Ointment 30g tube 1 x Per Week/30 Days Discharge Instructions: Apply Zinc Oxide to periwound with each dressing change Peri-Wound Care: Sween Lotion (Moisturizing lotion) 1 x Per Week/30 Days Discharge Instructions: Apply moisturizing lotion as directed Prim Dressing: KerraCel Ag Gelling Fiber Dressing, 4x5 in (silver alginate) 1 x Per Week/30 Days ary Discharge Instructions: Apply silver alginate apply to lower portion to wound on left leg. Prim Dressing: IODOFLEX 0.9% Cadexomer Iodine Pad 4x6 cm 1 x Per Week/30 Days ary Discharge Instructions: Apply to wound  bed to all necrotic tissue. Secondary Dressing: ABD Pad, 8x10 1 x Per Week/30 Days Discharge Instructions: Apply over primary dressing as directed. Secondary Dressing: CarboFLEX Odor Control Dressing, 4x4 in 1 x Per Week/30 Days Discharge Instructions: Apply over primary dressing as directed. Secondary Dressing: Zetuvit Plus 4x8 in 1 x Per Week/30 Days Discharge Instructions: Apply over primary dressing as directed. Compression Wrap: ThreePress (3 layer compression wrap) 1 x Per Week/30 Days Discharge  Instructions: Apply three layer compression as directed. Electronic Signature(s) Signed: 09/16/2021 3:54:57 PM By: Kalman Shan DO Signed: 09/16/2021 4:40:53 PM By: Deon Pilling RN, BSN Entered By: Deon Pilling on 09/16/2021 15:52:54 -------------------------------------------------------------------------------- Problem List Details Patient Name: Date of Service: [name], Nancy Nancy M. 09/16/2021 2:15 PM Medical Record Number: [id number] Patient Account Number: [id number] Date of Birth/Sex: Treating RN: 1960/08/13 (60 y.o. [name], [name] Primary Care Provider: A [facility], [place] Other Clinician: Referring Provider: Treating Provider/Extender: Britt Bolognese [facility], [place] Nancy Weeks in Treatment: 1 Active Problems ICD-10 Encounter Code Description Active Date MDM Diagnosis L97.812 Non-pressure chronic ulcer of other part of right lower leg with fat layer 09/07/2021 [name] Yes exposed L97.822 Non-pressure chronic ulcer of other part of left lower leg with fat layer exposed8/29/2023 [name] Yes I89.0 Lymphedema, not elsewhere classified 09/07/2021 [name] Yes I87.313 Chronic venous hypertension (idiopathic) with ulcer of bilateral lower extremity 09/07/2021 [name] Yes J44.9 Chronic obstructive pulmonary disease, unspecified 09/07/2021 [name] Yes G35 Multiple sclerosis 09/07/2021 [name] Yes Inactive Problems Resolved Problems Electronic Signature(s) Signed: 09/16/2021 3:54:57 PM By: Kalman Shan DO Entered By: Kalman Shan on 09/16/2021 15:26:41 -------------------------------------------------------------------------------- Progress Note Details Patient Name: Date of Service: [name], Nancy Nancy M. 09/16/2021 2:15 PM Medical Record Number: [id number] Patient Account Number: [id number] Date of Birth/Sex: Treating RN: [birth year] (61 y.o. F) Primary Care Provider: A [facility], [place] Other Clinician: Referring Provider: Treating Provider/Extender: Britt Bolognese [facility], [place] Nancy Weeks in Treatment: 1 Subjective Chief Complaint Information obtained from Patient 09/07/2021; bilateral lower extremity wounds History of Present Illness (HPI) Admission 09/07/2021 [name] is a 61 year old female with a past medical history of COPD, multiple sclerosis and lymphedema that presents the clinic for a 50-monthhistory of nonhealing ulcers to her lower extremities bilaterally. She was recently seen in the ED on 8/11 for left lower cellulitis and given IV antibiotics and discharged on oral Augmentin. She completed this on 8/19. Patient does not wear compression stockings. She does not have a history of wounds previously. She is not on a diuretic. 9/7; patient presents for follow-up. She states that she needed to take the wraps off yesterday due to drainage coming through. We have been using silver alginate to the right lower extremity wound and gentamicin and mupirocin ointment with calcium alginate to the left lower extremity wound all under Kerlix/Coban. She currently denies signs of infection. Patient History Information obtained from Patient, Chart. Family History Cancer - Maternal Grandparents, Diabetes - Maternal Grandparents, Heart Disease - Father. Social History Current every day smoker - 1ppd, Marital Status - Married, Alcohol Use - Never, Drug Use - [name] History, Caffeine Use - Never. Medical History Hematologic/Lymphatic Patient has history of  Anemia, Lymphedema Respiratory Patient has history of Pneumothorax - bilateral Cardiovascular Patient has history of Hypertension Musculoskeletal Patient has history of Osteoarthritis Neurologic Patient has history of Seizure Disorder Hospitalization/Surgery History - 06/2013 ORIF left toe. - bladder surgery 2007. - appendectomy. Medical A Surgical History Notes nd Constitutional Symptoms (General Health) HLA B27 positive Respiratory pulmonary nodules Gastrointestinal GERD IBS Genitourinary UTI Musculoskeletal MS Objective Constitutional respirations regular, non-labored and within target range for patient.. Vitals Time Taken: 2:29 PM, Height: 64 in, Weight: 135 lbs, BMI: 23.2, Temperature: 97.8 F, Pulse: 91 bpm, Respiratory Rate: 18 breaths/min, Blood Pressure: 126/82 mmHg. Cardiovascular 2+ dorsalis pedis/posterior tibialis pulses. Psychiatric pleasant and cooperative. General Notes: Right lower extremity: 3+ pitting edema to the knee. Cobblestoning skin  changes. Weeping throughout. [name] increased warmth or purulent drainage noted. Left lower extremity: Scattered open wounds with granulation tissue and nonviable tissue. 3+ pitting edema to the knee. [name] increased warmth or purulent drainage noted. Venous stasis dermatitis. Integumentary (Hair, Skin) Wound #1 status is Open. Original cause of wound was Gradually Appeared. The date acquired was: 06/04/2021. The wound has been in treatment 1 weeks. The wound is located on the Right,Circumferential Lower Leg. The wound measures 19cm length x 11cm width x 0.1cm depth; 164.148cm^2 area and 16.415cm^3 volume. There is Fat Layer (Subcutaneous Tissue) exposed. There is [name] tunneling or undermining noted. There is a large amount of serous drainage noted. The wound margin is distinct with the outline attached to the wound base. There is large (67-100%) red, pink granulation within the wound bed. There is a small (1- 33%) amount of necrotic  tissue within the wound bed including Adherent Slough. Wound #2 status is Open. Original cause of wound was Gradually Appeared. The date acquired was: 06/04/2021. The wound has been in treatment 1 weeks. The wound is located on the Left,Circumferential Lower Leg. The wound measures 17cm length x 14cm width x 0.2cm depth; 186.925cm^2 area and 37.385cm^3 volume. There is Fat Layer (Subcutaneous Tissue) exposed. There is a large amount of serosanguineous drainage noted. The wound margin is distinct with the outline attached to the wound base. There is medium (34-66%) red, pink granulation within the wound bed. There is a medium (34-66%) amount of necrotic tissue within the wound bed including Adherent Slough. Assessment Active Problems ICD-10 Non-pressure chronic ulcer of other part of right lower leg with fat layer exposed Non-pressure chronic ulcer of other part of left lower leg with fat layer exposed Lymphedema, not elsewhere classified Chronic venous hypertension (idiopathic) with ulcer of bilateral lower extremity Chronic obstructive pulmonary disease, unspecified Multiple sclerosis Patient's right lower extremity wound has shown improvement in appearance since last clinic visit. The left lower extremity wounds are stable. At this time I recommended going up on the compression and adding a nurse visit in the week to help address her drainage issue. We will continue silver alginate to the right lower extremity wounds and do Iodoflex to the wounds that have nonviable tissue on the left lower extremity. All under 3 layer compression. Follow-up in 1 week. Procedures Wound #1 Pre-procedure diagnosis of Wound #1 is a Lymphedema located on the Right,Circumferential Lower Leg . There was a Three Layer Compression Therapy Procedure by Deon Pilling, RN. Post procedure Diagnosis Wound #1: Same as Pre-Procedure Wound #2 Pre-procedure diagnosis of Wound #2 is a Lymphedema located on the  Left,Circumferential Lower Leg . There was a Three Layer Compression Therapy Procedure by Deon Pilling, RN. Post procedure Diagnosis Wound #2: Same as Pre-Procedure Plan Follow-up Appointments: Return Appointment in 1 week. - Dr. Heber [place] [facility] [place], Room 8 Monday or Tuesday Nurse Visit: - Thursday overflow Room 6 Anesthetic: Wound #1 Right,Circumferential Lower Leg: (In clinic) Topical Lidocaine 4% applied to wound bed Wound #2 Left,Circumferential Lower Leg: (In clinic) Topical Lidocaine 4% applied to wound bed Bathing/ Shower/ Hygiene: May shower with protection but do not get wound dressing(s) wet. Edema Control - Lymphedema / SCD / Other: Elevate legs to the level of the heart or above for 30 minutes daily and/or when sitting, a frequency of: - 3-4 times a day throughout the day. Avoid standing for long periods of time. Exercise regularly WOUND #1: - Lower Leg Wound Laterality: Right, Circumferential Cleanser: Soap and Water 2 x Per  Week/30 Days Discharge Instructions: May shower and wash wound with dial antibacterial soap and water prior to dressing change. Cleanser: Wound Cleanser 2 x Per Week/30 Days Discharge Instructions: Cleanse the wound with wound cleanser prior to applying a clean dressing using gauze sponges, not tissue or cotton balls. Peri-Wound Care: Triamcinolone 15 (g) 2 x Per Week/30 Days Discharge Instructions: Use triamcinolone 15 (g) as directed Peri-Wound Care: Zinc Oxide Ointment 30g tube 2 x Per Week/30 Days Discharge Instructions: Apply Zinc Oxide to periwound with each dressing change Peri-Wound Care: Sween Lotion (Moisturizing lotion) 2 x Per Week/30 Days Discharge Instructions: Apply moisturizing lotion as directed Prim Dressing: KerraCel Ag Gelling Fiber Dressing, 4x5 in (silver alginate) 2 x Per Week/30 Days ary Discharge Instructions: Apply silver alginate to wound bed as instructed Secondary Dressing: ABD Pad, 8x10 2 x Per Week/30 Days Discharge  Instructions: Apply over primary dressing as directed. Secondary Dressing: CarboFLEX Odor Control Dressing, 4x4 in 2 x Per Week/30 Days Discharge Instructions: Apply over primary dressing as directed. Secondary Dressing: Zetuvit Plus 4x8 in 2 x Per Week/30 Days Discharge Instructions: Apply over primary dressing as directed. Com pression Wrap: ThreePress (3 layer compression wrap) 2 x Per Week/30 Days Discharge Instructions: Apply three layer compression as directed. WOUND #2: - Lower Leg Wound Laterality: Left, Circumferential Cleanser: Soap and Water 1 x Per Week/30 Days Discharge Instructions: May shower and wash wound with dial antibacterial soap and water prior to dressing change. Cleanser: Wound Cleanser 1 x Per Week/30 Days Discharge Instructions: Cleanse the wound with wound cleanser prior to applying a clean dressing using gauze sponges, not tissue or cotton balls. Peri-Wound Care: Triamcinolone 15 (g) 1 x Per Week/30 Days Discharge Instructions: Use triamcinolone 15 (g) as directed Peri-Wound Care: Zinc Oxide Ointment 30g tube 1 x Per Week/30 Days Discharge Instructions: Apply Zinc Oxide to periwound with each dressing change Peri-Wound Care: Sween Lotion (Moisturizing lotion) 1 x Per Week/30 Days Discharge Instructions: Apply moisturizing lotion as directed Prim Dressing: KerraCel Ag Gelling Fiber Dressing, 4x5 in (silver alginate) 1 x Per Week/30 Days ary Discharge Instructions: Apply silver alginate apply to lower portion to wound on left leg. Prim Dressing: IODOFLEX 0.9% Cadexomer Iodine Pad 4x6 cm 1 x Per Week/30 Days ary Discharge Instructions: Apply to wound bed to all necrotic tissue. Secondary Dressing: ABD Pad, 8x10 1 x Per Week/30 Days Discharge Instructions: Apply over primary dressing as directed. Secondary Dressing: CarboFLEX Odor Control Dressing, 4x4 in 1 x Per Week/30 Days Discharge Instructions: Apply over primary dressing as directed. Secondary Dressing:  Zetuvit Plus 4x8 in 1 x Per Week/30 Days Discharge Instructions: Apply over primary dressing as directed. Com pression Wrap: ThreePress (3 layer compression wrap) 1 x Per Week/30 Days Discharge Instructions: Apply three layer compression as directed. 1. Silver alginate under 3 layer compression to the right lower extremity 2. Iodoflex, silver alginate under 3 layer compression to the left lower extremity 3. Follow-up in 1 week Electronic Signature(s) Signed: 09/16/2021 3:54:57 PM By: Kalman Shan DO Signed: 09/16/2021 4:40:53 PM By: Deon Pilling RN, BSN Entered By: Deon Pilling on 09/16/2021 15:53:06 -------------------------------------------------------------------------------- HxROS Details Patient Name: Date of Service: [name], Nancy Nancy M. 09/16/2021 2:15 PM Medical Record Number: [id number] Patient Account Number: [id number] Date of Birth/Sex: Treating RN: [birth year] (61 y.o. F) Primary Care Provider: A [facility], [place] Nancy Other Clinician: Referring Provider: Treating Provider/Extender: Kalman Shan A [facility], [place] Nancy Weeks in Treatment: 1 Information Obtained From Patient Chart Constitutional Symptoms (General  Health) Medical History: Past Medical History Notes: HLA B27 positive Hematologic/Lymphatic Medical History: Positive for: Anemia; Lymphedema Respiratory Medical History: Positive for: Pneumothorax - bilateral Past Medical History Notes: pulmonary nodules Cardiovascular Medical History: Positive for: Hypertension Gastrointestinal Medical History: Past Medical History Notes: GERD IBS Genitourinary Medical History: Past Medical History Notes: UTI Musculoskeletal Medical History: Positive for: Osteoarthritis Past Medical History Notes: MS Neurologic Medical History: Positive for: Seizure Disorder Immunizations Pneumococcal Vaccine: Received Pneumococcal Vaccination: [name] Implantable Devices [name] devices added Hospitalization / Surgery  History Type of Hospitalization/Surgery 06/2013 ORIF left toe bladder surgery 2007 appendectomy Family and Social History Cancer: Yes - Maternal Grandparents; Diabetes: Yes - Maternal Grandparents; Heart Disease: Yes - Father; Current every day smoker - 1ppd; Marital Status - Married; Alcohol Use: Never; Drug Use: [name] History; Caffeine Use: Never; Financial Concerns: [name]; Food, Clothing or Shelter Needs: [name]; Support System Lacking: [name]; Transportation Concerns: [name] Electronic Signature(s) Signed: 09/16/2021 3:54:57 PM By: Kalman Shan DO Entered By: Kalman Shan on 09/16/2021 15:28:12 -------------------------------------------------------------------------------- SuperBill Details Patient Name: Date of Service: [name], Nancy Nancy M. 09/16/2021 Medical Record Number: [id number] Patient Account Number: [id number] Date of Birth/Sex: Treating RN: 01/01/1961 (62 y.o. [name] Primary Care Provider: A [facility], [place] Nancy Other Clinician: Referring Provider: Treating Provider/Extender: Britt Bolognese [facility], [place] Nancy Weeks in Treatment: 1 Diagnosis Coding ICD-10 Codes Code Description [id number] Non-pressure chronic ulcer of other part of right lower leg with fat layer exposed L97.822 Non-pressure chronic ulcer of other part of left lower leg with fat layer exposed I89.0 Lymphedema, not elsewhere classified I87.313 Chronic venous hypertension (idiopathic) with ulcer of bilateral lower extremity J44.9 Chronic obstructive pulmonary disease, unspecified G35 Multiple sclerosis Facility Procedures CPT4: Code [id number] Description: 81 BILATERAL: Application of multi-layer venous compression system; leg (below knee), including ankle and foot. Modifier: Quantity: 1 Physician Procedures : CPT4 Code Description Modifier [id number] - WC PHYS LEVEL 3 - EST PT ICD-10 Diagnosis Description O35.009 Non-pressure chronic ulcer of other part of right lower leg with fat layer  exposed L97.822 Non-pressure chronic ulcer of other part of left  lower leg with fat layer exposed I89.0 Lymphedema, not elsewhere classified I87.313 Chronic venous hypertension (idiopathic) with ulcer of bilateral lower extremity Quantity: 1 Electronic Signature(s) Signed: 09/16/2021 3:54:57 PM By: Kalman Shan DO Entered By: Kalman Shan on 09/16/2021 15:30:51

## 2021-09-17 NOTE — Progress Notes (Signed)
[name], [name] ([id number]) Visit Report for 09/16/2021 Arrival Information Details Patient Name: Date of Service: RO [place], [name] 09/16/2021 2:15 PM Medical Record Number: [id number] Patient Account Number: [id number] Date of Birth/Sex: Treating RN: Feb 07, 1960 (61 y.o. F) Primary Care Paige Vanderwoude: A [facility] [place] Other Clinician: Referring Verdia Bolt: Treating Sharron Petruska/Extender: Sharin Mons [facility] [place] Weeks in Treatment: 1 Visit Information History Since Last Visit Added or deleted any medications: No Patient Arrived: Wheel Chair Any new allergies or adverse reactions: No Arrival Time: 14:26 Had a fall or experienced change in No Accompanied By: son activities of daily living that may affect Transfer Assistance: Manual risk of falls: Patient Identification Verified: Yes Signs or symptoms of abuse/neglect since No Secondary Verification Process Completed: Yes last visito Patient Requires Transmission-Based Precautions: No Hospitalized since last visit: No Patient Has Alerts: Yes Implantable device outside of the clinic No Patient Alerts: 5/23 ABI: L1.01 R 1.10 excluding 5/23 TBI: L0.75 R 0.90 cellular tissue based products placed in the center since last visit: Has Compression in Place as Prescribed: Yes Has Footwear/Offloading in Place as Yes Prescribed: Left: Surgical Shoe with Pressure Relief Insole Right: Surgical Shoe with Pressure Relief Insole Pain Present Now: Yes Electronic Signature(s) Signed: 09/17/2021 12:32:37 PM By: Thayer Dallas Entered By: Thayer Dallas on 09/16/2021 14:28:55 -------------------------------------------------------------------------------- Compression Therapy Details Patient Name: Date of Service: [name], [name] 09/16/2021 2:15 PM Medical Record Number: [id number] Patient Account Number: [id number] Date of Birth/Sex: Treating RN: 01/11/1960 (62 y.o. [name] Primary Care Messiah Ahr: A [facility]  [place] Other Clinician: Referring Hartleigh Edmonston: Treating Aretta Stetzel/Extender: Sharin Mons [facility] [place] Weeks in Treatment: 1 Compression Therapy Performed for Wound Assessment: Wound #1 Right,Circumferential Lower Leg Performed By: Clinician [name], RN Compression Type: Three Layer Post Procedure Diagnosis Same as Pre-procedure Electronic Signature(s) Signed: 09/16/2021 4:40:53 PM By: [name] RN, BSN Entered By: [name] on 09/16/2021 15:15:45 -------------------------------------------------------------------------------- Compression Therapy Details Patient Name: Date of Service: [name], [name] 09/16/2021 2:15 PM Medical Record Number: [id number] Patient Account Number: [id number] Date of Birth/Sex: Treating RN: 16-Oct-1960 (61 y.o. [name] Primary Care Yanelie Abraha: A [facility] [place] Other Clinician: Referring Mikaela Hilgeman: Treating Bronislaus Verdell/Extender: Sharin Mons [facility] [place] Weeks in Treatment: 1 Compression Therapy Performed for Wound Assessment: Wound #2 Left,Circumferential Lower Leg Performed By: Clinician [name], RN Compression Type: Three Layer Post Procedure Diagnosis Same as Pre-procedure Electronic Signature(s) Signed: 09/16/2021 4:40:53 PM By: [name] RN, BSN Entered By: [name] on 09/16/2021 15:15:46 -------------------------------------------------------------------------------- Encounter Discharge Information Details Patient Name: Date of Service: [name], [name] 09/16/2021 2:15 PM Medical Record Number: [id number] Patient Account Number: [id number] Date of Birth/Sex: Treating RN: 02-14-1960 (61 y.o. [name] Primary Care Davide Risdon: A [facility] [place] Other Clinician: Referring Nial Hawe: Treating Rowin Bayron/Extender: Sharin Mons [facility] [place] Weeks in Treatment: 1 Encounter Discharge Information Items Discharge Condition:  Stable Ambulatory Status: Wheelchair Discharge Destination: Home Transportation: Private Auto Accompanied By: husband Schedule Follow-up Appointment: Yes Clinical Summary of Care: Electronic Signature(s) Signed: 09/16/2021 4:40:53 PM By: [name] RN, BSN Entered By: [name] on 09/16/2021 15:19:28 -------------------------------------------------------------------------------- Lower Extremity Assessment Details Patient Name: Date of Service: [name], [name] 09/16/2021 2:15 PM Medical Record Number: [id number] Patient Account Number: [id number] Date of Birth/Sex: Treating RN: 11-09-1960 (61 y.o. F) Primary Care Wava Kildow: A  [facility] [place] Other Clinician: Referring Andersen Mckiver: Treating Johnie Stadel/Extender: Geralyn Corwin A [facility] [place] Weeks in Treatment: 1 Edema Assessment Assessed: [Left: No] [Right: No] Edema: [Left: Yes] [Right: Yes] Calf Left: Right: Point of Measurement: 27 cm From Medial Instep 36.5 cm 38 cm Ankle Left: Right: Point of Measurement: 9 cm From Medial Instep 24.5 cm 24 cm Electronic Signature(s) Signed: 09/17/2021 12:32:37 PM By: Thayer Dallas Entered By: Thayer Dallas on 09/16/2021 14:58:32 -------------------------------------------------------------------------------- Multi Wound Chart Details Patient Name: Date of Service: [name], [name] 09/16/2021 2:15 PM Medical Record Number: [id number] Patient Account Number: [id number] Date of Birth/Sex: Treating RN: 03-28-1960 (61 y.o. F) Primary Care Vlasta Baskin: A [facility] [place] Other Clinician: Referring Abdinasir Spadafore: Treating Alazae Crymes/Extender: Geralyn Corwin A [facility] [place] Weeks in Treatment: 1 Vital Signs Height(in): 64 Pulse(bpm): 91 Weight(lbs): 135 Blood Pressure(mmHg): 126/82 Body Mass Index(BMI): 23.2 Temperature(F): 97.8 Respiratory Rate(breaths/min): 18 Photos: [N/A:N/A] Right, Circumferential Lower Leg Left, Circumferential Lower  Leg N/A Wound Location: Gradually Appeared Gradually Appeared N/A Wounding Event: Lymphedema Lymphedema N/A Primary Etiology: Anemia, Lymphedema, Pneumothorax, Anemia, Lymphedema, Pneumothorax, N/A Comorbid History: Hypertension, Osteoarthritis, Seizure Hypertension, Osteoarthritis, Seizure Disorder Disorder 06/04/2021 06/04/2021 N/A Date Acquired: 1 1 N/A Weeks of Treatment: Open Open N/A Wound Status: No No N/A Wound Recurrence: Yes Yes N/A Clustered Wound: 3 5 N/A Clustered Quantity: 19x11x0.1 17x14x0.2 N/A Measurements L x W x D (cm) 164.148 186.925 N/A A (cm) : rea 16.415 37.385 N/A Volume (cm) : 76.30% -35.60% N/A % Reduction in Area: 76.30% -35.60% N/A % Reduction in Volume: Full Thickness Without Exposed Full Thickness Without Exposed N/A Classification: Support Structures Support Structures Large Large N/A Exudate Amount: Serous Serosanguineous N/A Exudate Type: amber red, brown N/A Exudate Color: Distinct, outline attached Distinct, outline attached N/A Wound Margin: Large (67-100%) Medium (34-66%) N/A Granulation Amount: Red, Pink Red, Pink N/A Granulation Quality: Small (1-33%) Medium (34-66%) N/A Necrotic Amount: Fat Layer (Subcutaneous Tissue): Yes Fat Layer (Subcutaneous Tissue): Yes N/A Exposed Structures: Fascia: No Fascia: No Tendon: No Tendon: No Muscle: No Muscle: No Joint: No Joint: No Bone: No Bone: No None Small (1-33%) N/A Epithelialization: Compression Therapy Compression Therapy N/A Procedures Performed: Treatment Notes Wound #1 (Lower Leg) Wound Laterality: Right, Circumferential Cleanser Soap and Water Discharge Instruction: May shower and wash wound with dial antibacterial soap and water prior to dressing change. Wound Cleanser Discharge Instruction: Cleanse the wound with wound cleanser prior to applying a clean dressing using gauze sponges, not tissue or cotton balls. Peri-Wound Care Triamcinolone 15  (g) Discharge Instruction: Use triamcinolone 15 (g) as directed Sween Lotion (Moisturizing lotion) Discharge Instruction: Apply moisturizing lotion as directed Topical Primary Dressing KerraCel Ag Gelling Fiber Dressing, 4x5 in (silver alginate) Discharge Instruction: Apply silver alginate to wound bed as instructed Secondary Dressing ABD Pad, 8x10 Discharge Instruction: Apply over primary dressing as directed. CarboFLEX Odor Control Dressing, 4x4 in Discharge Instruction: Apply over primary dressing as directed. Zetuvit Plus 4x8 in Discharge Instruction: Apply over primary dressing as directed. Secured With Compression Wrap ThreePress (3 layer compression wrap) Discharge Instruction: Apply three layer compression as directed. Compression Stockings Add-Ons Wound #2 (Lower Leg) Wound Laterality: Left, Circumferential Cleanser Soap and Water Discharge Instruction: May shower and wash wound with dial antibacterial soap and water prior to dressing change. Wound Cleanser Discharge Instruction: Cleanse the wound with wound cleanser prior to applying a clean dressing using gauze sponges, not tissue or cotton balls. Peri-Wound Care Triamcinolone 15 (g) Discharge  Instruction: Use triamcinolone 15 (g) as directed Sween Lotion (Moisturizing lotion) Discharge Instruction: Apply moisturizing lotion as directed Topical Primary Dressing KerraCel Ag Gelling Fiber Dressing, 4x5 in (silver alginate) Discharge Instruction: Apply silver alginate apply to lower portion to wound on left leg. IODOFLEX 0.9% Cadexomer Iodine Pad 4x6 cm Discharge Instruction: Apply to wound bed to all necrotic tissue. Secondary Dressing ABD Pad, 8x10 Discharge Instruction: Apply over primary dressing as directed. CarboFLEX Odor Control Dressing, 4x4 in Discharge Instruction: Apply over primary dressing as directed. Zetuvit Plus 4x8 in Discharge Instruction: Apply over primary dressing as directed. Secured  With Compression Wrap ThreePress (3 layer compression wrap) Discharge Instruction: Apply three layer compression as directed. Compression Stockings Add-Ons Electronic Signature(s) Signed: 09/16/2021 3:54:57 PM By: Geralyn Corwin DO Entered By: Geralyn Corwin on 09/16/2021 15:26:46 -------------------------------------------------------------------------------- Multi-Disciplinary Care Plan Details Patient Name: Date of Service: [name], [name] 09/16/2021 2:15 PM Medical Record Number: [id number] Patient Account Number: [id number] Date of Birth/Sex: Treating RN: 06/16/1960 (61 y.o. [name] Primary Care Ezzie Senat: A [facility] [place] Other Clinician: Referring Ram Haugan: Treating Junah Yam/Extender: Sharin Mons [facility] [place] Weeks in Treatment: 1 Active Inactive Pain, Acute or Chronic Nursing Diagnoses: Pain, acute or chronic: actual or potential Potential alteration in comfort, pain Goals: Patient will verbalize adequate pain control and receive pain control interventions during procedures as needed Date Initiated: 09/07/2021 Target Resolution Date: 09/17/2021 Goal Status: Active Interventions: Encourage patient to take pain medications as prescribed Provide education on pain management Reposition patient for comfort Treatment Activities: Administer pain control measures as ordered : 09/07/2021 Notes: Venous Leg Ulcer Nursing Diagnoses: Knowledge deficit related to disease process and management Goals: Patient will maintain optimal edema control Date Initiated: 09/07/2021 Target Resolution Date: 09/17/2021 Goal Status: Active Interventions: Assess peripheral edema status every visit. Compression as ordered Treatment Activities: Therapeutic compression applied : 09/07/2021 Notes: Wound/Skin Impairment Nursing Diagnoses: Knowledge deficit related to ulceration/compromised skin integrity Goals: Patient/caregiver will verbalize  understanding of skin care regimen Date Initiated: 09/07/2021 Target Resolution Date: 09/16/2021 Goal Status: Active Interventions: Assess patient/caregiver ability to perform ulcer/skin care regimen upon admission and as needed Assess ulceration(s) every visit Provide education on ulcer and skin care Treatment Activities: Skin care regimen initiated : 09/07/2021 Topical wound management initiated : 09/07/2021 Notes: Electronic Signature(s) Signed: 09/16/2021 4:40:53 PM By: [name] RN, BSN Entered By: [name] on 09/16/2021 14:41:49 -------------------------------------------------------------------------------- Pain Assessment Details Patient Name: Date of Service: [name], [name] 09/16/2021 2:15 PM Medical Record Number: [id number] Patient Account Number: [id number] Date of Birth/Sex: Treating RN: 10-31-1960 (61 y.o. F) Primary Care Aryelle Figg: A [facility] [place] Other Clinician: Referring Maddy Graham: Treating Onie Hayashi/Extender: Geralyn Corwin A [facility] [place] Weeks in Treatment: 1 Active Problems Location of Pain Severity and Description of Pain Patient Has Paino Yes Site Locations Pain Management and Medication Current Pain Management: Electronic Signature(s) Signed: 09/17/2021 12:32:37 PM By: Thayer Dallas Entered By: Thayer Dallas on 09/16/2021 14:29:08 -------------------------------------------------------------------------------- Patient/Caregiver Education Details Patient Name: Date of Service: [name], [name] 9/7/2023andnbsp2:15 PM Medical Record Number: [id number] Patient Account Number: [id number] Date of Birth/Gender: Treating RN: 1960/05/22 (61 y.o. [name], [name] Primary Care Physician: A [facility] [place] Other Clinician: Referring Physician: Treating Physician/Extender: Sharin Mons [facility] [place] Weeks in Treatment: 1 Education Assessment Education Provided To: Patient Education Topics  Provided Pain: Handouts: A Guide to Pain Control Methods: Explain/Verbal Responses: Reinforcements  needed Electronic Signature(s) Signed: 09/16/2021 4:40:53 PM By: [name] RN, BSN Entered By: [name] on 09/16/2021 14:41:58 -------------------------------------------------------------------------------- Wound Assessment Details Patient Name: Date of Service: [name], [name] 09/16/2021 2:15 PM Medical Record Number: [id number] Patient Account Number: [id number] Date of Birth/Sex: Treating RN: [birth year] (61 y.o. F) Primary Care Muscab Brenneman: A [facility] [place] Other Clinician: Referring Alden Bensinger: Treating Endia Moncur/Extender: Geralyn Corwin A [facility] [place] Weeks in Treatment: 1 Wound Status Wound Number: 1 Primary Lymphedema Etiology: Wound Location: Right, Circumferential Lower Leg Wound Open Wounding Event: Gradually Appeared Status: Date Acquired: 06/04/2021 Comorbid Anemia, Lymphedema, Pneumothorax, Hypertension, Weeks Of Treatment: 1 History: Osteoarthritis, Seizure Disorder Clustered Wound: Yes Photos Wound Measurements Length: (cm) 19 Width: (cm) 11 Depth: (cm) 0.1 Clustered Quantity: 3 Area: (cm) 164.148 Volume: (cm) 16.415 % Reduction in Area: 76.3% % Reduction in Volume: 76.3% Epithelialization: None Tunneling: No Undermining: No Wound Description Classification: Full Thickness Without Exposed Support Structures Wound Margin: Distinct, outline attached Exudate Amount: Large Exudate Type: Serous Exudate Color: amber Foul Odor After Cleansing: No Slough/Fibrino Yes Wound Bed Granulation Amount: Large (67-100%) Exposed Structure Granulation Quality: Red, Pink Fascia Exposed: No Necrotic Amount: Small (1-33%) Fat Layer (Subcutaneous Tissue) Exposed: Yes Necrotic Quality: Adherent Slough Tendon Exposed: No Muscle Exposed: No Joint Exposed: No Bone Exposed: No Treatment Notes Wound #1 (Lower Leg) Wound Laterality: Right,  Circumferential Cleanser Soap and Water Discharge Instruction: May shower and wash wound with dial antibacterial soap and water prior to dressing change. Wound Cleanser Discharge Instruction: Cleanse the wound with wound cleanser prior to applying a clean dressing using gauze sponges, not tissue or cotton balls. Peri-Wound Care Triamcinolone 15 (g) Discharge Instruction: Use triamcinolone 15 (g) as directed Sween Lotion (Moisturizing lotion) Discharge Instruction: Apply moisturizing lotion as directed Topical Primary Dressing KerraCel Ag Gelling Fiber Dressing, 4x5 in (silver alginate) Discharge Instruction: Apply silver alginate to wound bed as instructed Secondary Dressing ABD Pad, 8x10 Discharge Instruction: Apply over primary dressing as directed. CarboFLEX Odor Control Dressing, 4x4 in Discharge Instruction: Apply over primary dressing as directed. Zetuvit Plus 4x8 in Discharge Instruction: Apply over primary dressing as directed. Secured With Compression Wrap ThreePress (3 layer compression wrap) Discharge Instruction: Apply three layer compression as directed. Compression Stockings Add-Ons Electronic Signature(s) Signed: 09/17/2021 12:32:37 PM By: Thayer Dallas Entered By: Thayer Dallas on 09/16/2021 15:01:06 -------------------------------------------------------------------------------- Wound Assessment Details Patient Name: Date of Service: [name], [name] 09/16/2021 2:15 PM Medical Record Number: [id number] Patient Account Number: [id number] Date of Birth/Sex: Treating RN: [birth year] (61 y.o. F) Primary Care Ebony Rickel: A [facility] [place] Other Clinician: Referring Cari Burgo: Treating Rudy Domek/Extender: Geralyn Corwin A [facility] [place] Weeks in Treatment: 1 Wound Status Wound Number: 2 Primary Lymphedema Etiology: Wound Location: Left, Circumferential Lower Leg Wound Open Wounding Event: Gradually Appeared Status: Date Acquired:  06/04/2021 Comorbid Anemia, Lymphedema, Pneumothorax, Hypertension, Weeks Of Treatment: 1 History: Osteoarthritis, Seizure Disorder Clustered Wound: Yes Photos Wound Measurements Length: (cm) 17 Width: (cm) 14 Depth: (cm) 0.2 Clustered Quantity: 5 Area: (cm) 186.925 Volume: (cm) 37.385 % Reduction in Area: -35.6% % Reduction in Volume: -35.6% Epithelialization: Small (1-33%) Wound Description Classification: Full Thickness Without Exposed Support Structures Wound Margin: Distinct, outline attached Exudate Amount: Large Exudate Type: Serosanguineous Exudate Color: red, brown Wound Bed Granulation Amount: Medium (34-66%) Granulation Quality: Red, Pink Necrotic Amount: Medium (34-66%) Necrotic Quality: Adherent Slough Foul Odor After Cleansing: No Slough/Fibrino Yes Exposed Structure Fascia Exposed: No Fat Layer (Subcutaneous  Tissue) Exposed: Yes Tendon Exposed: No Muscle Exposed: No Joint Exposed: No Bone Exposed: No Treatment Notes Wound #2 (Lower Leg) Wound Laterality: Left, Circumferential Cleanser Soap and Water Discharge Instruction: May shower and wash wound with dial antibacterial soap and water prior to dressing change. Wound Cleanser Discharge Instruction: Cleanse the wound with wound cleanser prior to applying a clean dressing using gauze sponges, not tissue or cotton balls. Peri-Wound Care Triamcinolone 15 (g) Discharge Instruction: Use triamcinolone 15 (g) as directed Sween Lotion (Moisturizing lotion) Discharge Instruction: Apply moisturizing lotion as directed Topical Primary Dressing KerraCel Ag Gelling Fiber Dressing, 4x5 in (silver alginate) Discharge Instruction: Apply silver alginate apply to lower portion to wound on left leg. IODOFLEX 0.9% Cadexomer Iodine Pad 4x6 cm Discharge Instruction: Apply to wound bed to all necrotic tissue. Secondary Dressing ABD Pad, 8x10 Discharge Instruction: Apply over primary dressing as directed. CarboFLEX  Odor Control Dressing, 4x4 in Discharge Instruction: Apply over primary dressing as directed. Zetuvit Plus 4x8 in Discharge Instruction: Apply over primary dressing as directed. Secured With Compression Wrap ThreePress (3 layer compression wrap) Discharge Instruction: Apply three layer compression as directed. Compression Stockings Add-Ons Electronic Signature(s) Signed: 09/17/2021 12:32:37 PM By: Thayer Dallas Entered By: Thayer Dallas on 09/16/2021 15:01:46 -------------------------------------------------------------------------------- Vitals Details Patient Name: Date of Service: [name], [name] 09/16/2021 2:15 PM Medical Record Number: [id number] Patient Account Number: [id number] Date of Birth/Sex: Treating RN: [birth year] (61 y.o. F) Primary Care Melida Northington: A [facility] [place] Other Clinician: Referring Eshal Propps: Treating Ariana Cavenaugh/Extender: Geralyn Corwin A [facility] [place] Weeks in Treatment: 1 Vital Signs Time Taken: 14:29 Temperature (F): 97.8 Height (in): 64 Pulse (bpm): 91 Weight (lbs): 135 Respiratory Rate (breaths/min): 18 Body Mass Index (BMI): 23.2 Blood Pressure (mmHg): 126/82 Reference Range: 80 - 120 mg / dl Electronic Signature(s) Signed: 09/17/2021 12:32:37 PM By: Thayer Dallas Entered By: Thayer Dallas on 09/16/2021 14:32:58

## 2021-09-19 DIAGNOSIS — J9601 Acute respiratory failure with hypoxia: Secondary | ICD-10-CM | POA: Diagnosis not present

## 2021-09-19 DIAGNOSIS — R1310 Dysphagia, unspecified: Secondary | ICD-10-CM | POA: Diagnosis not present

## 2021-09-19 DIAGNOSIS — A419 Sepsis, unspecified organism: Secondary | ICD-10-CM | POA: Diagnosis not present

## 2021-09-19 DIAGNOSIS — R5381 Other malaise: Secondary | ICD-10-CM | POA: Diagnosis not present

## 2021-09-20 ENCOUNTER — Encounter (HOSPITAL_BASED_OUTPATIENT_CLINIC_OR_DEPARTMENT_OTHER): Payer: BC Managed Care – PPO | Admitting: Internal Medicine

## 2021-09-20 DIAGNOSIS — I87313 Chronic venous hypertension (idiopathic) with ulcer of bilateral lower extremity: Secondary | ICD-10-CM | POA: Diagnosis not present

## 2021-09-20 DIAGNOSIS — L97822 Non-pressure chronic ulcer of other part of left lower leg with fat layer exposed: Secondary | ICD-10-CM

## 2021-09-20 DIAGNOSIS — L97812 Non-pressure chronic ulcer of other part of right lower leg with fat layer exposed: Secondary | ICD-10-CM | POA: Diagnosis not present

## 2021-09-20 DIAGNOSIS — I890 Lymphedema, not elsewhere classified: Secondary | ICD-10-CM

## 2021-09-20 DIAGNOSIS — J449 Chronic obstructive pulmonary disease, unspecified: Secondary | ICD-10-CM | POA: Diagnosis not present

## 2021-09-20 DIAGNOSIS — G35 Multiple sclerosis: Secondary | ICD-10-CM | POA: Diagnosis not present

## 2021-09-20 NOTE — Progress Notes (Signed)
SYNDEY, JASKOLSKI ([id number]) Visit Report for 09/20/2021 Arrival Information Details Patient Name: Date of Service: RO [place], [name] 09/20/2021 11:00 A M Medical Record Number: [id number] Patient Account Number: [id number] Date of Birth/Sex: Treating RN: Aug 01, 1960 (61 y.o. [name] Primary Care Shemica Meath: A [facility] [place] Other Clinician: Referring Alliyah Roesler: Treating Shaakira Borrero/Extender: Sharin Mons [facility] [place] Weeks in Treatment: 1 Visit Information History Since Last Visit Added or deleted any medications: No Patient Arrived: Wheel Chair Any new allergies or adverse reactions: No Arrival Time: 10:44 Had a fall or experienced change in No Accompanied By: husband activities of daily living that may affect Transfer Assistance: Manual risk of falls: Patient Identification Verified: Yes Signs or symptoms of abuse/neglect since last visito No Secondary Verification Process Completed: Yes Hospitalized since last visit: No Patient Requires Transmission-Based Precautions: No Implantable device outside of the clinic excluding No Patient Has Alerts: Yes cellular tissue based products placed in the center Patient Alerts: 5/23 ABI: L1.01 R 1.10 since last visit: 5/23 TBI: L0.75 R 0.90 Has Compression in Place as Prescribed: Yes Pain Present Now: Yes Electronic Signature(s) Signed: 09/20/2021 5:12:20 PM By: Thayer Dallas Entered By: Thayer Dallas on 09/20/2021 10:52:18 -------------------------------------------------------------------------------- Compression Therapy Details Patient Name: Date of Service: [name], [name] 09/20/2021 11:00 A M Medical Record Number: [id number] Patient Account Number: [id number] Date of Birth/Sex: Treating RN: 21-Jun-1960 (61 y.o. [name] Primary Care Beckem Tomberlin: A [facility] [place] Other Clinician: Referring Rual Vermeer: Treating Cherelle Midkiff/Extender: Sharin Mons [facility] [place] Weeks in Treatment: 1 Compression Therapy Performed for Wound Assessment: Wound #1 Right,Circumferential Lower Leg Performed By: Clinician Antonieta Iba, RN Compression Type: Three Layer Post Procedure Diagnosis Same as Pre-procedure Electronic Signature(s) Signed: 09/20/2021 5:02:56 PM By: Antonieta Iba Entered By: Antonieta Iba on 09/20/2021 11:48:22 -------------------------------------------------------------------------------- Compression Therapy Details Patient Name: Date of Service: [name], [name] 09/20/2021 11:00 A M Medical Record Number: [id number] Patient Account Number: [id number] Date of Birth/Sex: Treating RN: November 16, 1960 (61 y.o. [name] Primary Care Najae Filsaime: A [facility] [place] Other Clinician: Referring Raenah Murley: Treating Verena Shawgo/Extender: Sharin Mons [facility] [place] Weeks in Treatment: 1 Compression Therapy Performed for Wound Assessment: Wound #2 Left,Circumferential Lower Leg Performed By: Clinician Antonieta Iba, RN Compression Type: Three Layer Post Procedure Diagnosis Same as Pre-procedure Electronic Signature(s) Signed: 09/20/2021 5:02:56 PM By: Antonieta Iba Entered By: Antonieta Iba on 09/20/2021 11:48:51 -------------------------------------------------------------------------------- Encounter Discharge Information Details Patient Name: Date of Service: [name], [name] 09/20/2021 11:00 A M Medical Record Number: [id number] Patient Account Number: [id number] Date of Birth/Sex: Treating RN: [birth year] (61 y.o. [name] Primary Care Jaxden Blyden: A [facility] [place] Other Clinician: Referring Antrice Pal: Treating Bralee Feldt/Extender: Sharin Mons [facility] [place] Weeks in Treatment: 1 Encounter Discharge Information Items Discharge Condition: Stable Ambulatory Status: Wheelchair Discharge Destination: Home Transportation: Private Auto Accompanied By: Husband Schedule  Follow-up Appointment: Yes Clinical Summary of Care: Provided on 09/20/2021 Form Type Recipient Paper Patient Patient Electronic Signature(s) Signed: 09/20/2021 12:19:47 PM By: Antonieta Iba Entered By: Antonieta Iba on 09/20/2021 12:19:46 -------------------------------------------------------------------------------- Lower Extremity Assessment Details Patient Name: Date of Service: [name], [name] 09/20/2021 11:00 A M Medical Record Number: [id number] Patient Account Number: [id number] Date of Birth/Sex: Treating RN: 1960-10-17 (61 y.o. [name] Primary Care Melisse Caetano: A [facility] [place] Other Clinician: Referring Matthews Franks:  Treating Tynasia Mccaul/Extender: Geralyn Corwin [facility] [place] Weeks in Treatment: 1 Edema Assessment Assessed: [Left: No] [Right: No] Edema: [Left: Yes] [Right: Yes] Calf Left: Right: Point of Measurement: 27 cm From Medial Instep 33 cm 36 cm Ankle Left: Right: Point of Measurement: 9 cm From Medial Instep 23.6 cm 26 cm Electronic Signature(s) Signed: 09/20/2021 5:02:56 PM By: Antonieta Iba Signed: 09/20/2021 5:12:20 PM By: Thayer Dallas Entered By: Thayer Dallas on 09/20/2021 11:26:19 -------------------------------------------------------------------------------- Multi Wound Chart Details Patient Name: Date of Service: [name], [name] 09/20/2021 11:00 A M Medical Record Number: [id number] Patient Account Number: [id number] Date of Birth/Sex: Treating RN: 1960/05/18 (61 y.o. [name] Primary Care Jailan Trimm: A [facility] [place] Other Clinician: Referring Elisandro Jarrett: Treating Montarius Kitagawa/Extender: Sharin Mons [facility] [place] Weeks in Treatment: 1 Vital Signs Height(in): 64 Pulse(bpm): 74 Weight(lbs): 135 Blood Pressure(mmHg): 129/79 Body Mass Index(BMI): 23.2 Temperature(F): Respiratory Rate(breaths/min): 18 Photos: [N/A:N/A] Right, Circumferential Lower Leg Left, Circumferential  Lower Leg N/A Wound Location: Gradually Appeared Gradually Appeared N/A Wounding Event: Lymphedema Lymphedema N/A Primary Etiology: Anemia, Lymphedema, Pneumothorax, Anemia, Lymphedema, Pneumothorax, N/A Comorbid History: Hypertension, Osteoarthritis, Seizure Hypertension, Osteoarthritis, Seizure Disorder Disorder 06/04/2021 06/04/2021 N/A Date Acquired: 1 1 N/A Weeks of Treatment: Open Open N/A Wound Status: No No N/A Wound Recurrence: Yes Yes N/A Clustered Wound: 3 5 N/A Clustered Quantity: 22x12x0.1 15.5x18x0.2 N/A Measurements L x W x D (cm) 207.345 219.126 N/A A (cm) : rea 20.735 43.825 N/A Volume (cm) : 70.10% -59.00% N/A % Reduction in Area: 70.10% -59.00% N/A % Reduction in Volume: Full Thickness Without Exposed Full Thickness Without Exposed N/A Classification: Support Structures Support Structures Large Large N/A Exudate Amount: Serous Serosanguineous N/A Exudate Type: amber red, brown N/A Exudate Color: Distinct, outline attached Distinct, outline attached N/A Wound Margin: Large (67-100%) Medium (34-66%) N/A Granulation Amount: Red, Pink Red, Pink N/A Granulation Quality: Small (1-33%) Medium (34-66%) N/A Necrotic Amount: Fat Layer (Subcutaneous Tissue): Yes Fat Layer (Subcutaneous Tissue): Yes N/A Exposed Structures: Fascia: No Fascia: No Tendon: No Tendon: No Muscle: No Muscle: No Joint: No Joint: No Bone: No Bone: No None Small (1-33%) N/A Epithelialization: Compression Therapy Compression Therapy N/A Procedures Performed: Treatment Notes Electronic Signature(s) Signed: 09/20/2021 1:18:45 PM By: Geralyn Corwin DO Signed: 09/20/2021 5:02:56 PM By: Antonieta Iba Entered By: Geralyn Corwin on 09/20/2021 11:50:24 -------------------------------------------------------------------------------- Multi-Disciplinary Care Plan Details Patient Name: Date of Service: [name], [name] 09/20/2021 11:00 A M Medical Record Number:  [id number] Patient Account Number: [id number] Date of Birth/Sex: Treating RN: Mar 29, 1960 (61 y.o. [name] Primary Care Deaira Leckey: A [facility] [place] Other Clinician: Referring Breionna Punt: Treating Taneah Masri/Extender: Sharin Mons [facility] [place] Weeks in Treatment: 1 Active Inactive Venous Leg Ulcer Nursing Diagnoses: Knowledge deficit related to disease process and management Goals: Patient will maintain optimal edema control Date Initiated: 09/07/2021 Target Resolution Date: 10/25/2021 Goal Status: Active Interventions: Assess peripheral edema status every visit. Compression as ordered Treatment Activities: Therapeutic compression applied : 09/07/2021 Notes: 09/20/21: Edema control ongoing, using 3 layer wrap. Wound/Skin Impairment Nursing Diagnoses: Knowledge deficit related to ulceration/compromised skin integrity Goals: Patient/caregiver will verbalize understanding of skin care regimen Date Initiated: 09/07/2021 Target Resolution Date: 10/25/2021 Goal Status: Active Interventions: Assess patient/caregiver ability to perform ulcer/skin care regimen upon admission and as needed Assess ulceration(s) every visit Provide education on ulcer and skin care Treatment Activities: Skin care regimen initiated : 09/07/2021 Topical wound management initiated : 09/07/2021  Notes: 09/20/21: Wound care regimen continues. Electronic Signature(s) Signed: 09/20/2021 5:02:56 PM By: Antonieta Iba Entered By: Antonieta Iba on 09/20/2021 11:32:53 -------------------------------------------------------------------------------- Pain Assessment Details Patient Name: Date of Service: [name], [name] 09/20/2021 11:00 A M Medical Record Number: [id number] Patient Account Number: [id number] Date of Birth/Sex: Treating RN: [birth year] (61 y.o. [name] Primary Care Jennamarie Goings: A [facility] [place] Other Clinician: Referring Kitzia Camus: Treating  Rola Lennon/Extender: Sharin Mons [facility] [place] Weeks in Treatment: 1 Active Problems Location of Pain Severity and Description of Pain Patient Has Paino Yes Site Locations Rate the pain. Current Pain Level: 3 Pain Management and Medication Current Pain Management: Electronic Signature(s) Signed: 09/20/2021 5:02:56 PM By: Antonieta Iba Signed: 09/20/2021 5:12:20 PM By: Thayer Dallas Entered By: Thayer Dallas on 09/20/2021 11:25:39 -------------------------------------------------------------------------------- Patient/Caregiver Education Details Patient Name: Date of Service: [name], [name] 9/11/2023andnbsp11:00 A M Medical Record Number: [id number] Patient Account Number: [id number] Date of Birth/Gender: Treating RN: [birth year] (61 y.o. [name] Primary Care Physician: A [facility] [place] Other Clinician: Referring Physician: Treating Physician/Extender: Sharin Mons [facility] [place] Weeks in Treatment: 1 Education Assessment Education Provided To: Patient Education Topics Provided Venous: Methods: Explain/Verbal, Printed Responses: State content correctly Wound/Skin Impairment: Methods: Demonstration, Explain/Verbal, Printed Responses: State content correctly Electronic Signature(s) Signed: 09/20/2021 5:02:56 PM By: Antonieta Iba Entered By: Antonieta Iba on 09/20/2021 11:33:16 -------------------------------------------------------------------------------- Wound Assessment Details Patient Name: Date of Service: [name], [name] 09/20/2021 11:00 A M Medical Record Number: [id number] Patient Account Number: [id number] Date of Birth/Sex: Treating RN: Jun 11, 1960 (61 y.o. [name] Primary Care Maryssa Giampietro: A [facility] [place] Other Clinician: Referring Nedda Gains: Treating Janaysha Depaulo/Extender: Sharin Mons [facility] [place] Weeks in Treatment: 1 Wound Status Wound Number: 1 Primary  Lymphedema Etiology: Wound Location: Right, Circumferential Lower Leg Wound Open Wounding Event: Gradually Appeared Status: Date Acquired: 06/04/2021 Comorbid Anemia, Lymphedema, Pneumothorax, Hypertension, Weeks Of Treatment: 1 History: Osteoarthritis, Seizure Disorder Clustered Wound: Yes Photos Wound Measurements Length: (cm) 22 Width: (cm) 12 Depth: (cm) 0.1 Clustered Quantity: 3 Area: (cm) 207.345 Volume: (cm) 20.735 % Reduction in Area: 70.1% % Reduction in Volume: 70.1% Epithelialization: None Tunneling: No Undermining: No Wound Description Classification: Full Thickness Without Exposed Support Structures Wound Margin: Distinct, outline attached Exudate Amount: Large Exudate Type: Serous Exudate Color: amber Foul Odor After Cleansing: No Slough/Fibrino Yes Wound Bed Granulation Amount: Large (67-100%) Exposed Structure Granulation Quality: Red, Pink Fascia Exposed: No Necrotic Amount: Small (1-33%) Fat Layer (Subcutaneous Tissue) Exposed: Yes Necrotic Quality: Adherent Slough Tendon Exposed: No Muscle Exposed: No Joint Exposed: No Bone Exposed: No Treatment Notes Wound #1 (Lower Leg) Wound Laterality: Right, Circumferential Cleanser Soap and Water Discharge Instruction: May shower and wash wound with dial antibacterial soap and water prior to dressing change. Wound Cleanser Discharge Instruction: Cleanse the wound with wound cleanser prior to applying a clean dressing using gauze sponges, not tissue or cotton balls. Peri-Wound Care Triamcinolone 15 (g) Discharge Instruction: Use triamcinolone 15 (g) as directed Zinc Oxide Ointment 30g tube Discharge Instruction: Apply Zinc Oxide to periwound with each dressing change Sween Lotion (Moisturizing lotion) Discharge Instruction: Apply moisturizing lotion as directed Topical Primary Dressing KerraCel Ag Gelling Fiber Dressing, 4x5 in (silver alginate) Discharge Instruction: Apply silver alginate to wound  bed as instructed Secondary Dressing ABD Pad, 8x10 Discharge Instruction: Apply over primary dressing as directed. CarboFLEX Odor Control Dressing, 4x4 in Discharge  Instruction: Apply over primary dressing as directed. Zetuvit Plus 4x8 in Discharge Instruction: Apply over primary dressing as directed. Secured With Compression Wrap ThreePress (3 layer compression wrap) Discharge Instruction: Apply three layer compression as directed. Compression Stockings Add-Ons Electronic Signature(s) Signed: 09/20/2021 5:02:56 PM By: Antonieta Iba Signed: 09/20/2021 5:12:20 PM By: Thayer Dallas Entered By: Thayer Dallas on 09/20/2021 11:28:12 -------------------------------------------------------------------------------- Wound Assessment Details Patient Name: Date of Service: [name], [name] 09/20/2021 11:00 A M Medical Record Number: [id number] Patient Account Number: [id number] Date of Birth/Sex: Treating RN: [birth year] (61 y.o. [name] Primary Care Lindsey Hommel: A [facility] [place] Other Clinician: Referring Hodge Stachnik: Treating Maebel Marasco/Extender: Sharin Mons [facility] [place] Weeks in Treatment: 1 Wound Status Wound Number: 2 Primary Lymphedema Etiology: Wound Location: Left, Circumferential Lower Leg Wound Open Wound Open Wounding Event: Gradually Appeared Status: Date Acquired: 06/04/2021 Comorbid Anemia, Lymphedema, Pneumothorax, Hypertension, Weeks Of Treatment: 1 History: Osteoarthritis, Seizure Disorder Clustered Wound: Yes Photos Wound Measurements Length: (cm) 15.5 Width: (cm) 18 Depth: (cm) 0.2 Clustered Quantity: 5 Area: (cm) 219.126 Volume: (cm) 43.825 % Reduction in Area: -59% % Reduction in Volume: -59% Epithelialization: Small (1-33%) Tunneling: No Undermining: No Wound Description Classification: Full Thickness Without Exposed Support Structures Wound Margin: Distinct, outline attached Exudate Amount: Large Exudate Type:  Serosanguineous Exudate Color: red, brown Foul Odor After Cleansing: No Slough/Fibrino Yes Wound Bed Granulation Amount: Medium (34-66%) Exposed Structure Granulation Quality: Red, Pink Fascia Exposed: No Necrotic Amount: Medium (34-66%) Fat Layer (Subcutaneous Tissue) Exposed: Yes Necrotic Quality: Adherent Slough Tendon Exposed: No Muscle Exposed: No Joint Exposed: No Bone Exposed: No Treatment Notes Wound #2 (Lower Leg) Wound Laterality: Left, Circumferential Cleanser Soap and Water Discharge Instruction: May shower and wash wound with dial antibacterial soap and water prior to dressing change. Wound Cleanser Discharge Instruction: Cleanse the wound with wound cleanser prior to applying a clean dressing using gauze sponges, not tissue or cotton balls. Peri-Wound Care Triamcinolone 15 (g) Discharge Instruction: Use triamcinolone 15 (g) as directed Zinc Oxide Ointment 30g tube Discharge Instruction: Apply Zinc Oxide to periwound with each dressing change Sween Lotion (Moisturizing lotion) Discharge Instruction: Apply moisturizing lotion as directed Topical Gentamicin Discharge Instruction: As directed by physician Mupirocin Ointment Discharge Instruction: Apply Mupirocin (Bactroban) as instructed Primary Dressing Maxorb Extra Calcium Alginate Dressing, 4x4 in Discharge Instruction: Apply calcium alginate to wound bed as instructed Secondary Dressing ABD Pad, 8x10 Discharge Instruction: Apply over primary dressing as directed. CarboFLEX Odor Control Dressing, 4x4 in Discharge Instruction: Apply over primary dressing as directed. Zetuvit Plus 4x8 in Discharge Instruction: Apply over primary dressing as directed. Secured With Compression Wrap ThreePress (3 layer compression wrap) Discharge Instruction: Apply three layer compression as directed. Compression Stockings Add-Ons Electronic Signature(s) Signed: 09/20/2021 5:02:56 PM By: Antonieta Iba Signed: 09/20/2021  5:12:20 PM By: Thayer Dallas Entered By: Thayer Dallas on 09/20/2021 11:28:24 -------------------------------------------------------------------------------- Vitals Details Patient Name: Date of Service: [name], [name] 09/20/2021 11:00 A M Medical Record Number: [id number] Patient Account Number: [id number] Date of Birth/Sex: Treating RN: 14-Dec-1960 (61 y.o. [name] Primary Care Savon Bordonaro: A [facility] [place] Other Clinician: Referring Michaeljames Milnes: Treating Ernesteen Mihalic/Extender: Sharin Mons [facility] [place] Weeks in Treatment: 1 Vital Signs Time Taken: 10:52 Pulse (bpm): 74 Height (in): 64 Respiratory Rate (breaths/min): 18 Weight (lbs): 135 Blood Pressure (mmHg): 129/79 Body Mass Index (BMI): 23.2 Reference Range: 80 - 120 mg / dl Electronic Signature(s) Signed: 09/20/2021 5:12:20 PM By:  Thayer Dallas Entered By: Thayer Dallas on 09/20/2021 10:52:36

## 2021-09-20 NOTE — Progress Notes (Signed)
[name] ([id number]) Visit Report for 09/20/2021 Chief Complaint Document Details Patient Name: Date of Service: [name], [place] Nancy M. 09/20/2021 11:00 A M Medical Record Number: [id number] Patient Account Number: [id number] Date of Birth/Sex: Treating RN: Nov 06, 1960 (61 y.o. [name] Primary Care Provider: A [facility], [place] Other Clinician: Referring Provider: Treating Provider/Extender: Britt Bolognese [facility], [place] Weeks in Treatment: 1 Information Obtained from: Patient Chief Complaint 09/07/2021; bilateral lower extremity wounds Electronic Signature(s) Signed: 09/20/2021 1:18:45 PM By: Kalman Shan DO Entered By: Kalman Shan on 09/20/2021 11:50:33 -------------------------------------------------------------------------------- HPI Details Patient Name: Date of Service: [name], DIA Nancy M. 09/20/2021 11:00 A M Medical Record Number: [id number] Patient Account Number: [id number] Date of Birth/Sex: Treating RN: 05/14/1960 (61 y.o. [name] Primary Care Provider: A [facility], [place] Other Clinician: Referring Provider: Treating Provider/Extender: Britt Bolognese [facility], [place] Weeks in Treatment: 1 History of Present Illness HPI Description: Admission 09/07/2021 [name] is a 61 year old female with a past medical history of COPD, multiple sclerosis and lymphedema that presents the clinic for a 68-monthhistory of nonhealing ulcers to her lower extremities bilaterally. She was recently seen in the ED on 8/11 for left lower cellulitis and given IV antibiotics and discharged on oral Augmentin. She completed this on 8/19. Patient does not wear compression stockings. She does not have a history of wounds previously. She is not on a diuretic. 9/7; patient presents for follow-up. She states that she needed to take the wraps off yesterday due to drainage coming through. We have been using silver alginate to  the right lower extremity wound and gentamicin and mupirocin ointment with calcium alginate to the left lower extremity wound all under Kerlix/Coban. She currently denies signs of infection. 9/11; patient presents for follow-up. She was able to keep the wraps in place. We have been using silver alginate to the right lower extremity and silver alginate and Iodoflex to the left lower extremity all under 3 layer compression. She notes that the Iodoflex caused her irritation. She currently denies signs of infection. Electronic Signature(s) Signed: 09/20/2021 1:18:45 PM By: HKalman ShanDO Entered By: HKalman Shanon 09/20/2021 11:51:06 -------------------------------------------------------------------------------- Physical Exam Details Patient Name: Date of Service: [name], DIA Nancy M. 09/20/2021 11:00 A M Medical Record Number: [id number] Account Number: [id number] of Birth/Sex: Treating RN: 1Mar 04, 1962(61y.o. FSue LushPrimary Care Provider: A [facility], [place] Other Clinician: Referring Provider: Treating Provider/Extender: HBritt Bolognese[facility], [place] Weeks in Treatment: 1 Constitutional respirations regular, non-labored and within target range for patient.. Cardiovascular 2+ dorsalis pedis/posterior tibialis pulses. Psychiatric pleasant and cooperative. Notes Right lower extremity: 3+ pitting edema to the knee. Cobblestoning skin changes. Mild Weeping throughout. No increased warmth or purulent drainage noted. Left lower extremity: Scattered open wounds with granulation tissue and nonviable tissue. 3+ pitting edema to the knee. No increased warmth or purulent drainage noted. Venous stasis dermatitis. Electronic Signature(s) Signed: 09/20/2021 1:18:45 PM By: HKalman ShanDO Entered By: HKalman Shanon 09/20/2021 11:51:42 -------------------------------------------------------------------------------- Physician Orders  Details Patient Name: Date of Service: [name], DIA Nancy M. 09/20/2021 11:00 A M Medical Record Number: [id number] Account Number: [id number] of Birth/Sex: Treating RN: [birth year](61y.o. FSue LushPrimary Care Provider: A [facility], [place] Other Clinician: Referring Provider: Treating Provider/Extender: HBritt Bolognese[facility], [place] Weeks in  Treatment: 1 Verbal / Phone Orders: No Diagnosis Coding Follow-up Appointments ppointment in 1 week. - Dr. Heber [place] and [place], Room 8 Monday Return A Nurse Visit: - Thursday 9/14 (overflow Room 6) Anesthetic (In clinic) Topical Lidocaine 5% applied to wound bed (In clinic) Topical Lidocaine 4% applied to wound bed Bathing/ Shower/ Hygiene May shower with protection but do not get wound dressing(s) wet. Edema Control - Lymphedema / SCD / Other Elevate legs to the level of the heart or above for 30 minutes daily and/or when sitting, a frequency of: - 3-4 times a day throughout the day. Avoid standing for long periods of time. Exercise regularly Wound Treatment Wound #1 - Lower Leg Wound Laterality: Right, Circumferential Cleanser: Soap and Water 2 x Per Week/30 Days Discharge Instructions: May shower and wash wound with dial antibacterial soap and water prior to dressing change. Cleanser: Wound Cleanser 2 x Per Week/30 Days Discharge Instructions: Cleanse the wound with wound cleanser prior to applying a clean dressing using gauze sponges, not tissue or cotton balls. Peri-Wound Care: Triamcinolone 15 (g) 2 x Per Week/30 Days Discharge Instructions: Use triamcinolone 15 (g) as directed Peri-Wound Care: Zinc Oxide Ointment 30g tube 2 x Per Week/30 Days Discharge Instructions: Apply Zinc Oxide to periwound with each dressing change Peri-Wound Care: Sween Lotion (Moisturizing lotion) 2 x Per Week/30 Days Discharge Instructions: Apply moisturizing lotion as directed Prim Dressing: KerraCel Ag Gelling Fiber  Dressing, 4x5 in (silver alginate) 2 x Per Week/30 Days ary Discharge Instructions: Apply silver alginate to wound bed as instructed Secondary Dressing: ABD Pad, 8x10 2 x Per Week/30 Days Discharge Instructions: Apply over primary dressing as directed. Secondary Dressing: CarboFLEX Odor Control Dressing, 4x4 in 2 x Per Week/30 Days Discharge Instructions: Apply over primary dressing as directed. Secondary Dressing: Zetuvit Plus 4x8 in 2 x Per Week/30 Days Discharge Instructions: Apply over primary dressing as directed. Compression Wrap: ThreePress (3 layer compression wrap) 2 x Per Week/30 Days Discharge Instructions: Apply three layer compression as directed. Wound #2 - Lower Leg Wound Laterality: Left, Circumferential Cleanser: Soap and Water 1 x Per Week/30 Days Discharge Instructions: May shower and wash wound with dial antibacterial soap and water prior to dressing change. Cleanser: Wound Cleanser 1 x Per Week/30 Days Discharge Instructions: Cleanse the wound with wound cleanser prior to applying a clean dressing using gauze sponges, not tissue or cotton balls. Peri-Wound Care: Triamcinolone 15 (g) 1 x Per Week/30 Days Discharge Instructions: Use triamcinolone 15 (g) as directed Peri-Wound Care: Zinc Oxide Ointment 30g tube 1 x Per Week/30 Days Discharge Instructions: Apply Zinc Oxide to periwound with each dressing change Peri-Wound Care: Sween Lotion (Moisturizing lotion) 1 x Per Week/30 Days Discharge Instructions: Apply moisturizing lotion as directed Topical: Gentamicin 1 x Per Week/30 Days Discharge Instructions: As directed by physician Topical: Mupirocin Ointment 1 x Per Week/30 Days Discharge Instructions: Apply Mupirocin (Bactroban) as instructed Prim Dressing: Maxorb Extra Calcium Alginate Dressing, 4x4 in 1 x Per Week/30 Days ary Discharge Instructions: Apply calcium alginate to wound bed as instructed Secondary Dressing: ABD Pad, 8x10 1 x Per Week/30 Days Discharge  Instructions: Apply over primary dressing as directed. Secondary Dressing: CarboFLEX Odor Control Dressing, 4x4 in 1 x Per Week/30 Days Discharge Instructions: Apply over primary dressing as directed. Secondary Dressing: Zetuvit Plus 4x8 in 1 x Per Week/30 Days Discharge Instructions: Apply over primary dressing as directed. Compression Wrap: ThreePress (3 layer compression wrap) 1 x Per Week/30 Days Discharge Instructions: Apply three layer compression as directed. Electronic  Signature(s) Signed: 09/20/2021 1:18:45 PM By: Kalman Shan DO Entered By: Kalman Shan on 09/20/2021 11:51:49 -------------------------------------------------------------------------------- Problem List Details Patient Name: Date of Service: [name], DIA Nancy M. 09/20/2021 11:00 A M Medical Record Number: [id number] Patient Account Number: [id number] Date of Birth/Sex: Treating RN: 10-25-1960 (61 y.o. [name] Primary Care Provider: [facility], [place] Other Clinician: Referring Provider: Treating Provider/Extender: Britt Bolognese [facility], [place] Weeks in Treatment: 1 Active Problems ICD-10 Encounter Code Description Active Date MDM Diagnosis L97.812 Non-pressure chronic ulcer of other part of right lower leg with fat layer 09/07/2021 No Yes exposed L97.822 Non-pressure chronic ulcer of other part of left lower leg with fat layer exposed8/29/2023 No Yes I89.0 Lymphedema, not elsewhere classified 09/07/2021 No Yes I87.313 Chronic venous hypertension (idiopathic) with ulcer of bilateral lower extremity 09/07/2021 No Yes J44.9 Chronic obstructive pulmonary disease, unspecified 09/07/2021 No Yes G35 Multiple sclerosis 09/07/2021 No Yes Inactive Problems Resolved Problems Electronic Signature(s) Signed: 09/20/2021 1:18:45 PM By: Kalman Shan DO Entered By: Kalman Shan on 09/20/2021  11:50:17 -------------------------------------------------------------------------------- Progress Note Details Patient Name: Date of Service: [name], DIA Nancy M. 09/20/2021 11:00 A M Medical Record Number: [id number] Patient Account Number: [id number] Date of Birth/Sex: Treating RN: 1960-02-12 (61 y.o. [name] Primary Care Provider: A [facility], [place] Other Clinician: Referring Provider: Treating Provider/Extender: Britt Bolognese [facility], [place] Weeks in Treatment: 1 Subjective Chief Complaint Information obtained from Patient 09/07/2021; bilateral lower extremity wounds History of Present Illness (HPI) Admission 09/07/2021 [name] is a 61 year old female with a past medical history of COPD, multiple sclerosis and lymphedema that presents the clinic for a 72-monthhistory of nonhealing ulcers to her lower extremities bilaterally. She was recently seen in the ED on 8/11 for left lower cellulitis and given IV antibiotics and discharged on oral Augmentin. She completed this on 8/19. Patient does not wear compression stockings. She does not have a history of wounds previously. She is not on a diuretic. 9/7; patient presents for follow-up. She states that she needed to take the wraps off yesterday due to drainage coming through. We have been using silver alginate to the right lower extremity wound and gentamicin and mupirocin ointment with calcium alginate to the left lower extremity wound all under Kerlix/Coban. She currently denies signs of infection. 9/11; patient presents for follow-up. She was able to keep the wraps in place. We have been using silver alginate to the right lower extremity and silver alginate and Iodoflex to the left lower extremity all under 3 layer compression. She notes that the Iodoflex caused her irritation. She currently denies signs of infection. Patient History Information obtained from Patient, Chart. Family History Cancer -  Maternal Grandparents, Diabetes - Maternal Grandparents, Heart Disease - Father. Social History Current every day smoker - 1ppd, Marital Status - Married, Alcohol Use - Never, Drug Use - No History, Caffeine Use - Never. Medical History Hematologic/Lymphatic Patient has history of Anemia, Lymphedema Respiratory Patient has history of Pneumothorax - bilateral Cardiovascular Patient has history of Hypertension Musculoskeletal Patient has history of Osteoarthritis Neurologic Patient has history of Seizure Disorder Hospitalization/Surgery History - 06/2013 ORIF left toe. - bladder surgery 2007. - appendectomy. Medical A Surgical History Notes nd Constitutional Symptoms (General Health) HLA B27 positive Respiratory pulmonary nodules Gastrointestinal GERD IBS Genitourinary UTI Musculoskeletal MS Objective Constitutional respirations regular, non-labored and within target range for patient.. Vitals Time Taken: 10:52 AM, Height: 64 in, Weight:  135 lbs, BMI: 23.2, Pulse: 74 bpm, Respiratory Rate: 18 breaths/min, Blood Pressure: 129/79 mmHg. Cardiovascular 2+ dorsalis pedis/posterior tibialis pulses. Psychiatric pleasant and cooperative. General Notes: Right lower extremity: 3+ pitting edema to the knee. Cobblestoning skin changes. Mild Weeping throughout. No increased warmth or purulent drainage noted. Left lower extremity: Scattered open wounds with granulation tissue and nonviable tissue. 3+ pitting edema to the knee. No increased warmth or purulent drainage noted. Venous stasis dermatitis. Integumentary (Hair, Skin) Wound #1 status is Open. Original cause of wound was Gradually Appeared. The date acquired was: 06/04/2021. The wound has been in treatment 1 weeks. The wound is located on the Right,Circumferential Lower Leg. The wound measures 22cm length x 12cm width x 0.1cm depth; 207.345cm^2 area and 20.735cm^3 volume. There is Fat Layer (Subcutaneous Tissue) exposed. There is no  tunneling or undermining noted. There is a large amount of serous drainage noted. The wound margin is distinct with the outline attached to the wound base. There is large (67-100%) red, pink granulation within the wound bed. There is a small (1- 33%) amount of necrotic tissue within the wound bed including Adherent Slough. Wound #2 status is Open. Original cause of wound was Gradually Appeared. The date acquired was: 06/04/2021. The wound has been in treatment 1 weeks. The wound is located on the Left,Circumferential Lower Leg. The wound measures 15.5cm length x 18cm width x 0.2cm depth; 219.126cm^2 area and 43.825cm^3 volume. There is Fat Layer (Subcutaneous Tissue) exposed. There is no tunneling or undermining noted. There is a large amount of serosanguineous drainage noted. The wound margin is distinct with the outline attached to the wound base. There is medium (34-66%) red, pink granulation within the wound bed. There is a medium (34-66%) amount of necrotic tissue within the wound bed including Adherent Slough. Assessment Active Problems ICD-10 Non-pressure chronic ulcer of other part of right lower leg with fat layer exposed Non-pressure chronic ulcer of other part of left lower leg with fat layer exposed Lymphedema, not elsewhere classified Chronic venous hypertension (idiopathic) with ulcer of bilateral lower extremity Chronic obstructive pulmonary disease, unspecified Multiple sclerosis Patient's wounds are stable. I recommended continuing silver alginate under 3 layer compression to the right lower extremity. Since the Iodoflex is causing her irritation I recommended stopping this. I recommended gentamicin/mupirocin ointment with calcium alginate throughout the wound beds on the left lower extremity, again under 3 layer compression. No signs of surrounding soft tissue infection. She has a nurse visit later in the week. Follow-up in 1 week. Procedures Wound #1 Pre-procedure diagnosis of  Wound #1 is a Lymphedema located on the Right,Circumferential Lower Leg . There was a Three Layer Compression Therapy Procedure by Lorrin Jackson, RN. Post procedure Diagnosis Wound #1: Same as Pre-Procedure Wound #2 Pre-procedure diagnosis of Wound #2 is a Lymphedema located on the Left,Circumferential Lower Leg . There was a Three Layer Compression Therapy Procedure by Lorrin Jackson, RN. Post procedure Diagnosis Wound #2: Same as Pre-Procedure Plan Follow-up Appointments: Return Appointment in 1 week. - Dr. Heber [place] and [place], Room 8 Monday Nurse Visit: - Thursday 9/14 (overflow Room 6) Anesthetic: (In clinic) Topical Lidocaine 5% applied to wound bed (In clinic) Topical Lidocaine 4% applied to wound bed Bathing/ Shower/ Hygiene: May shower with protection but do not get wound dressing(s) wet. Edema Control - Lymphedema / SCD / Other: Elevate legs to the level of the heart or above for 30 minutes daily and/or when sitting, a frequency of: - 3-4 times a day throughout the day.  Avoid standing for long periods of time. Exercise regularly WOUND #1: - Lower Leg Wound Laterality: Right, Circumferential Cleanser: Soap and Water 2 x Per Week/30 Days Discharge Instructions: May shower and wash wound with dial antibacterial soap and water prior to dressing change. Cleanser: Wound Cleanser 2 x Per Week/30 Days Discharge Instructions: Cleanse the wound with wound cleanser prior to applying a clean dressing using gauze sponges, not tissue or cotton balls. Peri-Wound Care: Triamcinolone 15 (g) 2 x Per Week/30 Days Discharge Instructions: Use triamcinolone 15 (g) as directed Peri-Wound Care: Zinc Oxide Ointment 30g tube 2 x Per Week/30 Days Discharge Instructions: Apply Zinc Oxide to periwound with each dressing change Peri-Wound Care: Sween Lotion (Moisturizing lotion) 2 x Per Week/30 Days Discharge Instructions: Apply moisturizing lotion as directed Prim Dressing: KerraCel Ag Gelling Fiber  Dressing, 4x5 in (silver alginate) 2 x Per Week/30 Days ary Discharge Instructions: Apply silver alginate to wound bed as instructed Secondary Dressing: ABD Pad, 8x10 2 x Per Week/30 Days Discharge Instructions: Apply over primary dressing as directed. Secondary Dressing: CarboFLEX Odor Control Dressing, 4x4 in 2 x Per Week/30 Days Discharge Instructions: Apply over primary dressing as directed. Secondary Dressing: Zetuvit Plus 4x8 in 2 x Per Week/30 Days Discharge Instructions: Apply over primary dressing as directed. Com pression Wrap: ThreePress (3 layer compression wrap) 2 x Per Week/30 Days Discharge Instructions: Apply three layer compression as directed. WOUND #2: - Lower Leg Wound Laterality: Left, Circumferential Cleanser: Soap and Water 1 x Per Week/30 Days Discharge Instructions: May shower and wash wound with dial antibacterial soap and water prior to dressing change. Cleanser: Wound Cleanser 1 x Per Week/30 Days Discharge Instructions: Cleanse the wound with wound cleanser prior to applying a clean dressing using gauze sponges, not tissue or cotton balls. Peri-Wound Care: Triamcinolone 15 (g) 1 x Per Week/30 Days Discharge Instructions: Use triamcinolone 15 (g) as directed Peri-Wound Care: Zinc Oxide Ointment 30g tube 1 x Per Week/30 Days Discharge Instructions: Apply Zinc Oxide to periwound with each dressing change Peri-Wound Care: Sween Lotion (Moisturizing lotion) 1 x Per Week/30 Days Discharge Instructions: Apply moisturizing lotion as directed Topical: Gentamicin 1 x Per Week/30 Days Discharge Instructions: As directed by physician Topical: Mupirocin Ointment 1 x Per Week/30 Days Discharge Instructions: Apply Mupirocin (Bactroban) as instructed Prim Dressing: Maxorb Extra Calcium Alginate Dressing, 4x4 in 1 x Per Week/30 Days ary Discharge Instructions: Apply calcium alginate to wound bed as instructed Secondary Dressing: ABD Pad, 8x10 1 x Per Week/30 Days Discharge  Instructions: Apply over primary dressing as directed. Secondary Dressing: CarboFLEX Odor Control Dressing, 4x4 in 1 x Per Week/30 Days Discharge Instructions: Apply over primary dressing as directed. Secondary Dressing: Zetuvit Plus 4x8 in 1 x Per Week/30 Days Discharge Instructions: Apply over primary dressing as directed. Compression Wrap: ThreePress (3 layer compression wrap) 1 x Per Week/30 Days Discharge Instructions: Apply three layer compression as directed. 1. Silver alginate under 3 layer compression to the right lower extremity 2. Antibiotic ointment with calcium alginate to the left lower extremity all under 3 layer compression 3. Follow-up in 1 week Electronic Signature(s) Signed: 09/20/2021 1:18:45 PM By: Kalman Shan DO Entered By: Kalman Shan on 09/20/2021 11:53:26 -------------------------------------------------------------------------------- HxROS Details Patient Name: Date of Service: [name], DIA Nancy M. 09/20/2021 11:00 A M Medical Record Number: [id number] Patient Account Number: [id number] Date of Birth/Sex: Treating RN: [birth year] (61 y.o. [name] Primary Care Provider: [facility], [place] Other Clinician: Referring Provider: Treating Provider/Extender: Kalman Shan  [facility], [place] Weeks in Treatment: 1 Information Obtained From Patient Chart Constitutional Symptoms (General Health) Medical History: Past Medical History Notes: HLA B27 positive Hematologic/Lymphatic Medical History: Positive for: Anemia; Lymphedema Respiratory Medical History: Positive for: Pneumothorax - bilateral Past Medical History Notes: pulmonary nodules Cardiovascular Medical History: Positive for: Hypertension Gastrointestinal Medical History: Past Medical History Notes: GERD IBS Genitourinary Medical History: Past Medical History Notes: UTI Musculoskeletal Medical History: Positive for: Osteoarthritis Past Medical History  Notes: MS Neurologic Medical History: Positive for: Seizure Disorder Immunizations Pneumococcal Vaccine: Received Pneumococcal Vaccination: No Implantable Devices No devices added Hospitalization / Surgery History Type of Hospitalization/Surgery 06/2013 ORIF left toe bladder surgery 2007 appendectomy Family and Social History Cancer: Yes - Maternal Grandparents; Diabetes: Yes - Maternal Grandparents; Heart Disease: Yes - Father; Current every day smoker - 1ppd; Marital Status - Married; Alcohol Use: Never; Drug Use: No History; Caffeine Use: Never; Financial Concerns: No; Food, Clothing or Shelter Needs: No; Support System Lacking: No; Transportation Concerns: No Electronic Signature(s) Signed: 09/20/2021 1:18:45 PM By: Kalman Shan DO Signed: 09/20/2021 5:02:56 PM By: Lorrin Jackson Entered By: Kalman Shan on 09/20/2021 11:51:11 -------------------------------------------------------------------------------- SuperBill Details Patient Name: Date of Service: [name], DIA Nancy M. 09/20/2021 Medical Record Number: [id number] Patient Account Number: [id number] Date of Birth/Sex: Treating RN: 1960/09/21 (61 y.o. [name] Primary Care Provider: [facility], [place] Other Clinician: Referring Provider: Treating Provider/Extender: Britt Bolognese [facility], [place] Weeks in Treatment: 1 Diagnosis Coding ICD-10 Codes Code Description [phone] Non-pressure chronic ulcer of other part of right lower leg with fat layer exposed L97.822 Non-pressure chronic ulcer of other part of left lower leg with fat layer exposed I89.0 Lymphedema, not elsewhere classified I87.313 Chronic venous hypertension (idiopathic) with ulcer of bilateral lower extremity J44.9 Chronic obstructive pulmonary disease, unspecified G35 Multiple sclerosis Facility Procedures CPT4: Code [id number] Description: 81 BILATERAL: Application of multi-layer venous compression system; leg  (below knee), including ankle and t. ICD-10 Diagnosis Description L97.812 Non-pressure chronic ulcer of other part of right lower leg with fat layer exposed L97.822  Non-pressure chronic ulcer of other part of left lower leg with fat layer exposed Modifier: Quantity: 1 Physician Procedures : CPT4 Code Description Modifier [id number] - WC PHYS LEVEL 3 - EST PT 1 ICD-10 Diagnosis Description E02.233 Non-pressure chronic ulcer of other part of right lower leg with fat layer exposed L97.822 Non-pressure chronic ulcer of other part of left  lower leg with fat layer exposed I89.0 Lymphedema, not elsewhere classified I87.313 Chronic venous hypertension (idiopathic) with ulcer of bilateral lower extremity Quantity: Electronic Signature(s) Signed: 09/20/2021 1:18:45 PM By: Kalman Shan DO Entered By: Kalman Shan on 09/20/2021 11:53:39

## 2021-09-23 ENCOUNTER — Encounter (HOSPITAL_BASED_OUTPATIENT_CLINIC_OR_DEPARTMENT_OTHER): Payer: BC Managed Care – PPO | Admitting: Internal Medicine

## 2021-09-23 DIAGNOSIS — I890 Lymphedema, not elsewhere classified: Secondary | ICD-10-CM | POA: Diagnosis not present

## 2021-09-23 DIAGNOSIS — L97812 Non-pressure chronic ulcer of other part of right lower leg with fat layer exposed: Secondary | ICD-10-CM | POA: Diagnosis not present

## 2021-09-23 DIAGNOSIS — I87313 Chronic venous hypertension (idiopathic) with ulcer of bilateral lower extremity: Secondary | ICD-10-CM | POA: Diagnosis not present

## 2021-09-23 DIAGNOSIS — J449 Chronic obstructive pulmonary disease, unspecified: Secondary | ICD-10-CM | POA: Diagnosis not present

## 2021-09-23 DIAGNOSIS — L97822 Non-pressure chronic ulcer of other part of left lower leg with fat layer exposed: Secondary | ICD-10-CM | POA: Diagnosis not present

## 2021-09-23 DIAGNOSIS — G35 Multiple sclerosis: Secondary | ICD-10-CM | POA: Diagnosis not present

## 2021-09-23 NOTE — Progress Notes (Signed)
[name], [name] ([id number]) Visit Report for 09/23/2021 SuperBill Details Patient Name: Date of Service: [name], [name] 09/23/2021 Medical Record Number: [id number] Patient Account Number: [id number] Date of Birth/Sex: Treating RN: 15-Sep-1960 (61 y.o. 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 ICD-10 Diagnosis Description L97.812 Non-pressure chronic ulcer of other part of right lower leg with fat layer exposed L97.822 Non-pressure chronic ulcer of other part of left lower leg with fat layer exposed Electronic Signature(s) Signed: 09/23/2021 4:34:43 PM By: Antonieta Iba Signed: 09/23/2021 4:35:07 PM By: Baltazar Najjar MD Entered By: Antonieta Iba on 09/23/2021 13:23:20

## 2021-09-23 NOTE — Progress Notes (Signed)
DELCIA, SPITZLEY ([id number]) Visit Report for 09/23/2021 Arrival Information Details Patient Name: Date of Service: RO [place], [name] 09/23/2021 12:30 PM Medical Record Number: [id number] Patient Account Number: [id number] Date of Birth/Sex: Treating RN: [birth year] (61 y.o. [name] Primary Care Caelie R[place]: [facility] [place] Other Clinician: Referring Ruta Capece: Treating Nellie Chevalier/Extender: Chrys Racer [facility] [place] Weeks in Treatment: 2 Visit Information History Since Last Visit Added or deleted any medications: No Patient Arrived: Wheel Chair Any new allergies or adverse reactions: No Arrival Time: 12:37 Had a fall or experienced change in No Accompanied By: Husband activities of daily living that may affect Transfer Assistance: Manual risk of falls: Patient Identification Verified: Yes Signs or symptoms of abuse/neglect since last visito No Secondary Verification Process Completed: Yes Hospitalized since last visit: No Patient Requires Transmission-Based Precautions: No Implantable device outside of the clinic excluding No Patient Has Alerts: Yes cellular tissue based products placed in the center Patient Alerts: 5/23 ABI: L1.01 R 1.10 since last visit: 5/23 TBI: L0.75 R 0.90 Has Dressing in Place as Prescribed: Yes Has Compression in Place as Prescribed: Yes Pain Present Now: No Electronic Signature(s) Signed: 09/23/2021 4:34:43 PM By: Antonieta Iba Entered By: Antonieta Iba on 09/23/2021 12:43:40 -------------------------------------------------------------------------------- Compression Therapy Details Patient Name: Date of Service: [name], [name] 09/23/2021 12:30 PM Medical Record Number: [id number] Patient Account Number: [id number] Date of Birth/Sex: Treating RN: [birth year] (61 y.o. [name] Primary Care Emmajane Altamura: [facility] [place] Other Clinician: Referring Yuka Lallier: Treating Marguita Venning/Extender: Chrys Racer [facility] [place] Weeks in Treatment: 2 Compression Therapy Performed for Wound Assessment: Wound #1 Right,Circumferential Lower Leg Performed By: Clinician Antonieta Iba, RN Compression Type: Three Layer Electronic Signature(s) Signed: 09/23/2021 4:34:43 PM By: Antonieta Iba Entered By: Antonieta Iba on 09/23/2021 13:22:07 -------------------------------------------------------------------------------- Compression Therapy Details Patient Name: Date of Service: [name], [name] 09/23/2021 12:30 PM Medical Record Number: [id number] Patient Account Number: [id number] Date of Birth/Sex: Treating RN: 06-Sep-1960 (61 y.o. [name] Primary Care Samaiya Awadallah: [facility] [place] Other Clinician: Referring Serine Kea: Treating Coleman Kalas/Extender: Chrys Racer [facility] [place] Weeks in Treatment: 2 Compression Therapy Performed for Wound Assessment: Wound #2 Left,Circumferential Lower Leg Performed By: Clinician Antonieta Iba, RN Compression Type: Three Layer Electronic Signature(s) Signed: 09/23/2021 4:34:43 PM By: Antonieta Iba Entered By: Antonieta Iba on 09/23/2021 13:22:07 -------------------------------------------------------------------------------- Encounter Discharge Information Details Patient Name: Date of Service: [name], [name] 09/23/2021 12:30 PM Medical Record Number: [id number] Patient Account Number: [id number] Date of Birth/Sex: Treating RN: 06/08/1960 (61 y.o. [name] Primary Care Ryane Canavan: [facility] [place] Other Clinician: Referring Marche Hottenstein: Treating Bernarda Erck/Extender: Chrys Racer [facility] [place] Weeks in Treatment: 2 Encounter Discharge Information Items Discharge Condition: Stable Ambulatory Status: Wheelchair Discharge Destination: Home Transportation: Private Auto Accompanied By: husband Schedule Follow-up Appointment: Yes Clinical Summary of Care: Provided on  09/23/2021 Form Type Recipient Paper Patient Patient Electronic Signature(s) Signed: 09/23/2021 4:34:43 PM By: Antonieta Iba Entered By: Antonieta Iba on 09/23/2021 13:22:56 -------------------------------------------------------------------------------- Patient/Caregiver Education Details Patient Name: Date of Service: [name], [name] 9/14/2023andnbsp12:30 PM Medical Record Number: [id number] Patient Account Number: [id number] Date of Birth/Gender: Treating RN: 28-Feb-1960 (61 y.o. [name] Primary Care Physician: [facility] [place] Other Clinician: Referring Physician: Treating Physician/Extender: Chrys Racer [facility] [place] Weeks in  Treatment: 2 Education Assessment Education Provided To: Patient Education Topics Provided Venous: Methods: Explain/Verbal, Printed Responses: State content correctly Wound/Skin Impairment: Methods: Explain/Verbal, Printed Responses: State content correctly Electronic Signature(s) Signed: 09/23/2021 4:34:43 PM By: Antonieta Iba Entered By: Antonieta Iba on 09/23/2021 13:22:34 -------------------------------------------------------------------------------- Wound Assessment Details Patient Name: Date of Service: [name], [name] 09/23/2021 12:30 PM Medical Record Number: [id number] Patient Account Number: [id number] Date of Birth/Sex: Treating RN: [birth year] (61 y.o. [name] Primary Care Aariel Ems: [facility] [place] Other Clinician: Referring Jannah Guardiola: Treating Davonne Baby/Extender: Chrys Racer [facility] [place] Weeks in Treatment: 2 Wound Status Wound Number: 1 Primary Lymphedema Etiology: Wound Location: Right, Circumferential Lower Leg Wound Open Wounding Event: Gradually Appeared Status: Date Acquired: 06/04/2021 Comorbid Anemia, Lymphedema, Pneumothorax, Hypertension, Weeks Of Treatment: 2 History: Osteoarthritis, Seizure Disorder Clustered Wound: Yes Wound  Measurements Length: (cm) 22 Width: (cm) 12 Depth: (cm) 0.1 Clustered Quantity: 3 Area: (cm) 207.345 Volume: (cm) 20.735 % Reduction in Area: 70.1% % Reduction in Volume: 70.1% Epithelialization: None Tunneling: No Undermining: No Wound Description Classification: Full Thickness Without Exposed Support Structures Wound Margin: Distinct, outline attached Exudate Amount: Large Exudate Type: Serous Exudate Color: amber Foul Odor After Cleansing: No Slough/Fibrino Yes Wound Bed Granulation Amount: Large (67-100%) Exposed Structure Granulation Quality: Red, Pink Fascia Exposed: No Necrotic Amount: Small (1-33%) Fat Layer (Subcutaneous Tissue) Exposed: Yes Necrotic Quality: Adherent Slough Tendon Exposed: No Muscle Exposed: No Joint Exposed: No Bone Exposed: No Treatment Notes Wound #1 (Lower Leg) Wound Laterality: Right, Circumferential Cleanser Soap and Water Discharge Instruction: May shower and wash wound with dial antibacterial soap and water prior to dressing change. Wound Cleanser Discharge Instruction: Cleanse the wound with wound cleanser prior to applying a clean dressing using gauze sponges, not tissue or cotton balls. Peri-Wound Care Triamcinolone 15 (g) Discharge Instruction: Use triamcinolone 15 (g) as directed Zinc Oxide Ointment 30g tube Discharge Instruction: Apply Zinc Oxide to periwound with each dressing change Sween Lotion (Moisturizing lotion) Discharge Instruction: Apply moisturizing lotion as directed Topical Primary Dressing KerraCel Ag Gelling Fiber Dressing, 4x5 in (silver alginate) Discharge Instruction: Apply silver alginate to wound bed as instructed Secondary Dressing ABD Pad, 8x10 Discharge Instruction: Apply over primary dressing as directed. CarboFLEX Odor Control Dressing, 4x4 in Discharge Instruction: Apply over primary dressing as directed. Zetuvit Plus 4x8 in Discharge Instruction: Apply over primary dressing as  directed. Secured With Compression Wrap ThreePress (3 layer compression wrap) Discharge Instruction: Apply three layer compression as directed. Compression Stockings Add-Ons Electronic Signature(s) Signed: 09/23/2021 4:34:43 PM By: Antonieta Iba Entered By: Antonieta Iba on 09/23/2021 12:46:05 -------------------------------------------------------------------------------- Wound Assessment Details Patient Name: Date of Service: [name], [name] 09/23/2021 12:30 PM Medical Record Number: [id number] Patient Account Number: [id number] Date of Birth/Sex: Treating RN: 09-14-1960 (61 y.o. [name] Primary Care Ilka Lovick: A [facility] [place] Other Clinician: Referring Authur Cubit: Treating Dinh Ayotte/Extender: Chrys Racer [facility] [place] Weeks in Treatment: 2 Wound Status Wound Number: 2 Primary Lymphedema Etiology: Wound Location: Left, Circumferential Lower Leg Wound Open Wounding Event: Gradually Appeared Status: Date Acquired: 06/04/2021 Comorbid Anemia, Lymphedema, Pneumothorax, Hypertension, Weeks Of Treatment: 2 History: Osteoarthritis, Seizure Disorder Clustered Wound: Yes Wound Measurements Length: (cm) 15.5 Width: (cm) 18 Depth: (cm) 0.2 Clustered Quantity: 5 Area: (cm) 219.126 Volume: (cm) 43.825 % Reduction in Area: -59% % Reduction in Volume: -59% Epithelialization: Small (1-33%) Tunneling: No Undermining: No Wound Description Classification: Full Thickness Without Exposed Support Stru Wound  Margin: Distinct, outline attached Exudate Amount: Large Exudate Type: Serosanguineous Exudate Color: red, brown ctures Foul Odor After Cleansing: No Slough/Fibrino Yes Wound Bed Granulation Amount: Medium (34-66%) Exposed Structure Granulation Quality: Red, Pink Fascia Exposed: No Necrotic Amount: Medium (34-66%) Fat Layer (Subcutaneous Tissue) Exposed: Yes Necrotic Quality: Adherent Slough Tendon Exposed: No Muscle Exposed:  No Joint Exposed: No Bone Exposed: No Treatment Notes Wound #2 (Lower Leg) Wound Laterality: Left, Circumferential Cleanser Soap and Water Discharge Instruction: May shower and wash wound with dial antibacterial soap and water prior to dressing change. Wound Cleanser Discharge Instruction: Cleanse the wound with wound cleanser prior to applying a clean dressing using gauze sponges, not tissue or cotton balls. Peri-Wound Care Triamcinolone 15 (g) Discharge Instruction: Use triamcinolone 15 (g) as directed Zinc Oxide Ointment 30g tube Discharge Instruction: Apply Zinc Oxide to periwound with each dressing change Sween Lotion (Moisturizing lotion) Discharge Instruction: Apply moisturizing lotion as directed Topical Gentamicin Discharge Instruction: As directed by physician Mupirocin Ointment Discharge Instruction: Apply Mupirocin (Bactroban) as instructed Primary Dressing Maxorb Extra Calcium Alginate Dressing, 4x4 in Discharge Instruction: Apply calcium alginate to wound bed as instructed Secondary Dressing ABD Pad, 8x10 Discharge Instruction: Apply over primary dressing as directed. CarboFLEX Odor Control Dressing, 4x4 in Discharge Instruction: Apply over primary dressing as directed. Zetuvit Plus 4x8 in Discharge Instruction: Apply over primary dressing as directed. Secured With Compression Wrap ThreePress (3 layer compression wrap) Discharge Instruction: Apply three layer compression as directed. Compression Stockings Add-Ons Electronic Signature(s) Signed: 09/23/2021 4:34:43 PM By: Lorrin Jackson Entered By: Lorrin Jackson on 09/23/2021 12:46:26 -------------------------------------------------------------------------------- Vitals Details Patient Name: Date of Service: [name], [name] 09/23/2021 12:30 PM Medical Record Number: [id number] Patient Account Number: [id number] Date of Birth/Sex: Treating RN: Apr 20, 1960 (61 y.o. [name] Primary Care  Renwick Asman: A [facility] [place] Other Clinician: Referring Teauna Dubach: Treating Markise Haymer/Extender: Harvel Quale [facility] [place] Weeks in Treatment: 2 Vital Signs Time Taken: 12:43 Temperature (F): 97.8 Height (in): 64 Pulse (bpm): 73 Weight (lbs): 135 Respiratory Rate (breaths/min): 18 Body Mass Index (BMI): 23.2 Blood Pressure (mmHg): 136/81 Reference Range: 80 - 120 mg / dl Electronic Signature(s) Signed: 09/23/2021 4:34:43 PM By: Lorrin Jackson Entered By: Lorrin Jackson on 09/23/2021 12:45:19

## 2021-09-27 ENCOUNTER — Encounter (HOSPITAL_BASED_OUTPATIENT_CLINIC_OR_DEPARTMENT_OTHER): Payer: BC Managed Care – PPO | Admitting: Internal Medicine

## 2021-09-27 DIAGNOSIS — L97822 Non-pressure chronic ulcer of other part of left lower leg with fat layer exposed: Secondary | ICD-10-CM | POA: Diagnosis not present

## 2021-09-27 DIAGNOSIS — L97812 Non-pressure chronic ulcer of other part of right lower leg with fat layer exposed: Secondary | ICD-10-CM | POA: Diagnosis not present

## 2021-09-27 DIAGNOSIS — I890 Lymphedema, not elsewhere classified: Secondary | ICD-10-CM

## 2021-09-27 DIAGNOSIS — J449 Chronic obstructive pulmonary disease, unspecified: Secondary | ICD-10-CM | POA: Diagnosis not present

## 2021-09-27 DIAGNOSIS — G35 Multiple sclerosis: Secondary | ICD-10-CM | POA: Diagnosis not present

## 2021-09-27 DIAGNOSIS — I87313 Chronic venous hypertension (idiopathic) with ulcer of bilateral lower extremity: Secondary | ICD-10-CM

## 2021-09-27 NOTE — Progress Notes (Signed)
[name], [name] ([id number]) Visit Report for 09/27/2021 Arrival Information Details Patient Name: Date of Service: RO [place], [place] Nancy M. 09/27/2021 3:00 PM Medical Record Number: [id number] Patient Account Number: [id number] Date of Birth/Sex: Treating RN: 07/26/1960 (61 y.o. [name], [name] Primary Care Jamahl Lemmons: A [facility], [place] Other Clinician: Referring Ariadna Setter: Treating Quaniya Damas/Extender: Sharin Mons [facility] [place] Weeks in Treatment: 2 Visit Information History Since Last Visit Added or deleted any medications: No Patient Arrived: Wheel Chair Any new allergies or adverse reactions: No Arrival Time: 14:50 Had a fall or experienced change in No Accompanied By: husband activities of daily living that may affect Transfer Assistance: None risk of falls: Patient Identification Verified: Yes Signs or symptoms of abuse/neglect since last visito No Secondary Verification Process Completed: Yes Hospitalized since last visit: No Patient Requires Transmission-Based Precautions: No Implantable device outside of the clinic excluding No Patient Has Alerts: Yes cellular tissue based products placed in the center Patient Alerts: 5/23 ABI: L1.01 R 1.10 since last visit: 5/23 TBI: L0.75 R 0.90 Has Dressing in Place as Prescribed: Yes Has Compression in Place as Prescribed: No Pain Present Now: No Notes per patient wraps were too tight removed last night. Electronic Signature(s) Signed: 09/27/2021 4:49:15 PM By: [name] RN, BSN Entered By: [name] on 09/27/2021 15:07:43 -------------------------------------------------------------------------------- Compression Therapy Details Patient Name: Date of Service: [name], DIA Nancy M. 09/27/2021 3:00 PM Medical Record Number: [id number] Patient Account Number: [id number] Date of Birth/Sex: Treating RN: April 25, 1960 (61 y.o. [name] Primary Care Klint Lezcano: A [facility] [place] Other  Clinician: Referring Tarahji Ramthun: Treating Alando Colleran/Extender: Sharin Mons [facility] [place] Weeks in Treatment: 2 Compression Therapy Performed for Wound Assessment: Wound #1 Right,Circumferential Lower Leg Performed By: Clinician [name], RN Compression Type: Three Layer Post Procedure Diagnosis Same as Pre-procedure Electronic Signature(s) Signed: 09/27/2021 4:49:15 PM By: [name] RN, BSN Entered By: [name] on 09/27/2021 15:25:22 -------------------------------------------------------------------------------- Compression Therapy Details Patient Name: Date of Service: [name], DIA Nancy M. 09/27/2021 3:00 PM Medical Record Number: [id number] Patient Account Number: [id number] Date of Birth/Sex: Treating RN: 01/23/1960 (61 y.o. [name] Primary Care Koby Pickup: A [facility] [place] Other Clinician: Referring Delayne Sanzo: Treating Ismerai Bin/Extender: Sharin Mons [facility] [place] Weeks in Treatment: 2 Compression Therapy Performed for Wound Assessment: Wound #2 Left,Circumferential Lower Leg Performed By: Clinician [name], RN Compression Type: Three Layer Post Procedure Diagnosis Same as Pre-procedure Electronic Signature(s) Signed: 09/27/2021 4:49:15 PM By: [name] RN, BSN Entered By: [name] on 09/27/2021 15:25:22 -------------------------------------------------------------------------------- Encounter Discharge Information Details Patient Name: Date of Service: [name], DIA Nancy M. 09/27/2021 3:00 PM Medical Record Number: [id number] Patient Account Number: [id number] Date of Birth/Sex: Treating RN: 14-Feb-1960 (61 y.o. [name] Primary Care Chasitee Zenker: A [facility] [place] Other Clinician: Referring Inice Sanluis: Treating Fayrene Towner/Extender: Sharin Mons [facility] [place] Weeks in Treatment: 2 Encounter Discharge Information Items Discharge Condition: Stable Ambulatory Status:  Wheelchair Discharge Destination: Home Transportation: Private Auto Accompanied By: husband Schedule Follow-up Appointment: Yes Clinical Summary of Care: Electronic Signature(s) Signed: 09/27/2021 4:49:15 PM By: [name] RN, BSN Entered By: [name] on 09/27/2021 15:26:49 -------------------------------------------------------------------------------- Lower Extremity Assessment Details Patient Name: Date of Service: [name], DIA Nancy M. 09/27/2021 3:00 PM Medical Record Number: [id number] Patient Account Number: [id number] Date of Birth/Sex: Treating RN: Jul 04, 1960 (61 y.o. [name] Primary Care  Cedar Ditullio: A [facility] [place] Other Clinician: Referring Dontavis Tschantz: Treating Tyera Hansley/Extender: Kalman Shan A [facility] [place] Weeks in Treatment: 2 Edema Assessment Assessed: [Left: Yes] [Right: Yes] Edema: [Left: Yes] [Right: Yes] Calf Left: Right: Point of Measurement: 27 cm From Medial Instep 38 cm 38 cm Ankle Left: Right: Point of Measurement: 9 cm From Medial Instep 25 cm 25 cm Vascular Assessment Pulses: Dorsalis Pedis Palpable: [Left:Yes] [Right:Yes] Electronic Signature(s) Signed: 09/27/2021 4:49:15 PM By: Deon Pilling RN, BSN Entered By: Deon Pilling on 09/27/2021 15:08:29 -------------------------------------------------------------------------------- Multi Wound Chart Details Patient Name: Date of Service: [name], DIA Nancy M. 09/27/2021 3:00 PM Medical Record Number: [id number] Patient Account Number: [id number] Date of Birth/Sex: Treating RN: 1960-02-01 (61 y.o. [name], [name] Primary Care Ceola Para: A [facility] [place] Other Clinician: Referring Antoinetta Berrones: Treating Ilamae Geng/Extender: Kalman Shan A [facility] [place] Weeks in Treatment: 2 Vital Signs Height(in): 25 Pulse(bpm): 33 Weight(lbs): 135 Blood Pressure(mmHg): 134/81 Body Mass Index(BMI): 23.2 Temperature(F): 97.8 Respiratory  Rate(breaths/min): 20 Photos: [N/A:N/A] Right, Circumferential Lower Leg Left, Circumferential Lower Leg N/A Wound Location: Gradually Appeared Gradually Appeared N/A Wounding Event: Lymphedema Lymphedema N/A Primary Etiology: Anemia, Lymphedema, Pneumothorax, Anemia, Lymphedema, Pneumothorax, N/A Comorbid History: Hypertension, Osteoarthritis, Seizure Hypertension, Osteoarthritis, Seizure Disorder Disorder 06/04/2021 06/04/2021 N/A Date Acquired: 2 2 N/A Weeks of Treatment: Open Open N/A Wound Status: No No N/A Wound Recurrence: Yes Yes N/A Clustered Wound: 1 7 N/A Clustered Quantity: 9x17x0.1 15x13x0.1 N/A Measurements L x W x D (cm) 120.166 153.153 N/A A (cm) : rea 12.017 15.315 N/A Volume (cm) : 82.70% -11.10% N/A % Reduction in Area: 82.70% 44.40% N/A % Reduction in Volume: Full Thickness Without Exposed Full Thickness Without Exposed N/A Classification: Support Structures Support Structures Large Large N/A Exudate Amount: Serous Serosanguineous N/A Exudate Type: amber red, brown N/A Exudate Color: Distinct, outline attached Distinct, outline attached N/A Wound Margin: Large (67-100%) Large (67-100%) N/A Granulation Amount: Red, Pink Red, Pink N/A Granulation Quality: None Present (0%) Small (1-33%) N/A Necrotic Amount: Fat Layer (Subcutaneous Tissue): Yes Fat Layer (Subcutaneous Tissue): Yes N/A Exposed Structures: Fascia: No Fascia: No Tendon: No Tendon: No Muscle: No Muscle: No Joint: No Joint: No Bone: No Bone: No None Small (1-33%) N/A Epithelialization: Compression Therapy Compression Therapy N/A Procedures Performed: Treatment Notes Electronic Signature(s) Signed: 09/27/2021 3:53:44 PM By: Kalman Shan DO Signed: 09/27/2021 4:49:15 PM By: Deon Pilling RN, BSN Entered By: Kalman Shan on 09/27/2021 15:26:32 -------------------------------------------------------------------------------- Multi-Disciplinary Care Plan  Details Patient Name: Date of Service: [name], DIA Nancy M. 09/27/2021 3:00 PM Medical Record Number: [id number] Patient Account Number: [id number] Date of Birth/Sex: Treating RN: [birth year] (61 y.o. [name] Primary Care Shakeeta Godette: A [facility] [place] Other Clinician: Referring Jerelyn Trimarco: Treating Giancarlos Berendt/Extender: Britt Bolognese [facility] [place] Weeks in Treatment: 2 Active Inactive Venous Leg Ulcer Nursing Diagnoses: Knowledge deficit related to disease process and management Goals: Patient will maintain optimal edema control Date Initiated: 09/07/2021 Target Resolution Date: 11/05/2021 Goal Status: Active Interventions: Assess peripheral edema status every visit. Compression as ordered Treatment Activities: Therapeutic compression applied : 09/07/2021 Notes: 09/20/21: Edema control ongoing, using 3 layer wrap. Wound/Skin Impairment Nursing Diagnoses: Knowledge deficit related to ulceration/compromised skin integrity Goals: Patient/caregiver will verbalize understanding of skin care regimen Date Initiated: 09/07/2021 Target Resolution Date: 11/05/2021 Goal Status: Active Interventions: Assess patient/caregiver ability to perform ulcer/skin care regimen upon admission and as needed Assess ulceration(s) every visit Provide education on ulcer and  skin care Treatment Activities: Skin care regimen initiated : 09/07/2021 Topical wound management initiated : 09/07/2021 Notes: 09/20/21: Wound care regimen continues. Electronic Signature(s) Signed: 09/27/2021 4:49:15 PM By: [name] RN, BSN Entered By: [name] on 09/27/2021 15:08:51 -------------------------------------------------------------------------------- Pain Assessment Details Patient Name: Date of Service: [name], DIA Nancy M. 09/27/2021 3:00 PM Medical Record Number: [id number] Patient Account Number: [id number] Date of Birth/Sex: Treating RN: 03-13-1960 (61 y.o. [name] Primary Care Jaxston Chohan: A [facility] [place] Other Clinician: Referring Estalee Mccandlish: Treating Wayne Wicklund/Extender: Sharin Mons [facility] [place] Weeks in Treatment: 2 Active Problems Location of Pain Severity and Description of Pain Patient Has Paino No Site Locations Rate the pain. Current Pain Level: 0 Pain Management and Medication Current Pain Management: Medication: No Cold Application: No Rest: No Massage: No Activity: No T.E.N.S.: No Heat Application: No Leg drop or elevation: No Is the Current Pain Management Adequate: Adequate How does your wound impact your activities of daily livingo Sleep: No Bathing: No Appetite: No Relationship With Others: No Bladder Continence: No Emotions: No Bowel Continence: No Work: No Toileting: No Drive: No Dressing: No Hobbies: No Psychologist, prison and probation services) Signed: 09/27/2021 4:49:15 PM By: [name] RN, BSN Entered By: [name] on 09/27/2021 15:08:07 -------------------------------------------------------------------------------- Patient/Caregiver Education Details Patient Name: Date of Service: [name], DIA Nancy M. 9/18/2023andnbsp3:00 PM Medical Record Number: [id number] Patient Account Number: [id number] Date of Birth/Gender: Treating RN: [birth year] (61 y.o. [name] Primary Care Physician: A [facility] [place] Other Clinician: Referring Physician: Treating Physician/Extender: Sharin Mons [facility] [place] Weeks in Treatment: 2 Education Assessment Education Provided To: Patient Education Topics Provided Wound/Skin Impairment: Handouts: Skin Care Do's and Dont's Methods: Explain/Verbal Responses: Reinforcements needed Electronic Signature(s) Signed: 09/27/2021 4:49:15 PM By: [name] RN, BSN Entered By: [name] on 09/27/2021 15:09:02 -------------------------------------------------------------------------------- Wound Assessment Details Patient  Name: Date of Service: [name], DIA Nancy M. 09/27/2021 3:00 PM Medical Record Number: [id number] Patient Account Number: [id number] Date of Birth/Sex: Treating RN: March 04, 1960 (61 y.o. [name], [name] Primary Care Chanese Hartsough: A [facility] [place] Other Clinician: Referring Yukio Bisping: Treating Everlyn Farabaugh/Extender: Sharin Mons [facility] [place] Weeks in Treatment: 2 Wound Status Wound Number: 1 Primary Lymphedema Etiology: Wound Location: Right, Circumferential Lower Leg Wound Open Wounding Event: Gradually Appeared Status: Date Acquired: 06/04/2021 Comorbid Anemia, Lymphedema, Pneumothorax, Hypertension, Weeks Of Treatment: 2 History: Osteoarthritis, Seizure Disorder Clustered Wound: Yes Photos Wound Measurements Length: (cm) 9 Width: (cm) 17 Depth: (cm) 0.1 Clustered Quantity: 1 Area: (cm) 120.166 Volume: (cm) 12.017 % Reduction in Area: 82.7% % Reduction in Volume: 82.7% Epithelialization: None Tunneling: No Undermining: No Wound Description Classification: Full Thickness Without Exposed Support Structures Wound Margin: Distinct, outline attached Exudate Amount: Large Exudate Type: Serous Exudate Color: amber Foul Odor After Cleansing: No Slough/Fibrino No Wound Bed Granulation Amount: Large (67-100%) Exposed Structure Granulation Quality: Red, Pink Fascia Exposed: No Necrotic Amount: None Present (0%) Fat Layer (Subcutaneous Tissue) Exposed: Yes Tendon Exposed: No Muscle Exposed: No Joint Exposed: No Bone Exposed: No Treatment Notes Wound #1 (Lower Leg) Wound Laterality: Right, Circumferential Cleanser Soap and Water Discharge Instruction: May shower and wash wound with dial antibacterial soap and water prior to dressing change. Wound Cleanser Discharge Instruction: Cleanse the wound with wound cleanser prior to applying a clean dressing using gauze sponges, not tissue or cotton balls. Peri-Wound Care Triamcinolone 15 (g) Discharge  Instruction: Use triamcinolone 15 (g) as  directed Zinc Oxide Ointment 30g tube Discharge Instruction: Apply Zinc Oxide to periwound with each dressing change Sween Lotion (Moisturizing lotion) Discharge Instruction: Apply moisturizing lotion as directed Topical Primary Dressing KerraCel Ag Gelling Fiber Dressing, 4x5 in (silver alginate) Discharge Instruction: Apply silver alginate to wound bed as instructed Secondary Dressing ABD Pad, 8x10 Discharge Instruction: Apply over primary dressing as directed. CarboFLEX Odor Control Dressing, 4x4 in Discharge Instruction: Apply over primary dressing as directed. Zetuvit Plus 4x8 in Discharge Instruction: Apply over primary dressing as directed. Secured With Compression Wrap ThreePress (3 layer compression wrap) Discharge Instruction: Apply three layer compression as directed. Compression Stockings Add-Ons Electronic Signature(s) Signed: 09/27/2021 4:49:15 PM By: [name] RN, BSN Entered By: [name] on 09/27/2021 15:06:18 -------------------------------------------------------------------------------- Wound Assessment Details Patient Name: Date of Service: [name], DIA Nancy M. 09/27/2021 3:00 PM Medical Record Number: [id number] Patient Account Number: [id number] Date of Birth/Sex: Treating RN: 27-Sep-1960 (61 y.o. [name] Primary Care Breck Hollinger: A [facility] [place] Other Clinician: Referring Herley Bernardini: Treating Taelyr Jantz/Extender: Sharin Mons [facility] [place] Weeks in Treatment: 2 Wound Status Wound Number: 2 Primary Lymphedema Etiology: Wound Location: Left, Circumferential Lower Leg Wound Open Wounding Event: Gradually Appeared Status: Date Acquired: 06/04/2021 Comorbid Anemia, Lymphedema, Pneumothorax, Hypertension, Weeks Of Treatment: 2 History: Osteoarthritis, Seizure Disorder Clustered Wound: Yes Photos Wound Measurements Length: (cm) 15 Width: (cm) 13 Depth: (cm) 0.1 Clustered  Quantity: 7 Area: (cm) 153.153 Volume: (cm) 15.315 % Reduction in Area: -11.1% % Reduction in Volume: 44.4% Epithelialization: Small (1-33%) Tunneling: No Undermining: No Wound Description Classification: Full Thickness Without Exposed Support Structures Wound Margin: Distinct, outline attached Exudate Amount: Large Exudate Type: Serosanguineous Exudate Color: red, brown Foul Odor After Cleansing: No Slough/Fibrino Yes Wound Bed Granulation Amount: Large (67-100%) Exposed Structure Granulation Quality: Red, Pink Fascia Exposed: No Necrotic Amount: Small (1-33%) Fat Layer (Subcutaneous Tissue) Exposed: Yes Necrotic Quality: Adherent Slough Tendon Exposed: No Muscle Exposed: No Joint Exposed: No Bone Exposed: No Treatment Notes Wound #2 (Lower Leg) Wound Laterality: Left, Circumferential Cleanser Soap and Water Discharge Instruction: May shower and wash wound with dial antibacterial soap and water prior to dressing change. Wound Cleanser Discharge Instruction: Cleanse the wound with wound cleanser prior to applying a clean dressing using gauze sponges, not tissue or cotton balls. Peri-Wound Care Triamcinolone 15 (g) Discharge Instruction: Use triamcinolone 15 (g) as directed Zinc Oxide Ointment 30g tube Discharge Instruction: Apply Zinc Oxide to periwound with each dressing change Sween Lotion (Moisturizing lotion) Discharge Instruction: Apply moisturizing lotion as directed Topical Primary Dressing KerraCel Ag Gelling Fiber Dressing, 4x5 in (silver alginate) Discharge Instruction: Apply silver alginate to wound bed as instructed Secondary Dressing ABD Pad, 8x10 Discharge Instruction: Apply over primary dressing as directed. CarboFLEX Odor Control Dressing, 4x4 in Discharge Instruction: Apply over primary dressing as directed. Zetuvit Plus 4x8 in Discharge Instruction: Apply over primary dressing as directed. Secured With Compression Wrap ThreePress (3 layer  compression wrap) Discharge Instruction: Apply three layer compression as directed. Compression Stockings Add-Ons Electronic Signature(s) Signed: 09/27/2021 4:49:15 PM By: [name] RN, BSN Entered By: [name] on 09/27/2021 15:06:51 -------------------------------------------------------------------------------- Vitals Details Patient Name: Date of Service: [name], DIA Nancy M. 09/27/2021 3:00 PM Medical Record Number: [id number] Patient Account Number: [id number] Date of Birth/Sex: Treating RN: [birth year] (61 y.o. [name] Primary Care Yassmine Tamm: A [facility] [place] Other Clinician: Referring Dajae Kizer: Treating Beecher Furio/Extender: Sharin Mons [facility] [place] Weeks in Treatment: 2  Vital Signs Time Taken: 15:50 Temperature (F): 97.8 Height (in): 64 Pulse (bpm): 83 Weight (lbs): 135 Respiratory Rate (breaths/min): 20 Body Mass Index (BMI): 23.2 Blood Pressure (mmHg): 134/81 Reference Range: 80 - 120 mg / dl Electronic Signature(s) Signed: 09/27/2021 4:49:15 PM By: [name] RN, BSN Entered By: [name] on 09/27/2021 15:08:00

## 2021-09-27 NOTE — Progress Notes (Signed)
[name] ([id number]) Visit Report for 09/27/2021 Chief Complaint Document Details Patient Name: Date of Service: [name], [name] 09/27/2021 3:00 PM Medical Record Number: [id number] Patient Account Number: [id number] Date of Birth/Sex: Treating RN: 1960-07-31 (61 y.o. [name] Primary Care Provider: A [facility], [place] Other Clinician: Referring Provider: Treating Provider/Extender: Britt Bolognese [facility], [place] Weeks in Treatment: 2 Information Obtained from: Patient Chief Complaint 09/07/2021; bilateral lower extremity wounds Electronic Signature(s) Signed: 09/27/2021 3:53:44 PM By: Kalman Shan DO Entered By: Kalman Shan on 09/27/2021 15:26:39 -------------------------------------------------------------------------------- HPI Details Patient Name: Date of Service: [name], [name] 09/27/2021 3:00 PM Medical Record Number: [id number] Patient Account Number: [id number] Date of Birth/Sex: Treating RN: Jun 13, 1960 (61 y.o. [name] Primary Care Provider: A [facility], [place] Other Clinician: Referring Provider: Treating Provider/Extender: Britt Bolognese [facility], [place] Weeks in Treatment: 2 History of Present Illness HPI Description: Admission 09/07/2021 [name] is a 61 year old female with a past medical history of COPD, multiple sclerosis and lymphedema that presents the clinic for a 81-monthhistory of nonhealing ulcers to her lower extremities bilaterally. She was recently seen in the ED on 8/11 for left lower cellulitis and given IV antibiotics and discharged on oral Augmentin. She completed this on 8/19. Patient does not wear compression stockings. She does not have a history of wounds previously. She is not on a diuretic. 9/7; patient presents for follow-up. She states that she needed to take the wraps off yesterday due to drainage coming through. We have been using silver alginate to the  right lower extremity wound and gentamicin and mupirocin ointment with calcium alginate to the left lower extremity wound all under Kerlix/Coban. She currently denies signs of infection. 9/11; patient presents for follow-up. She was able to keep the wraps in place. We have been using silver alginate to the right lower extremity and silver alginate and Iodoflex to the left lower extremity all under 3 layer compression. She notes that the Iodoflex caused her irritation. She currently denies signs of infection. 9/18; patient presents for follow-up. We have been using silver alginate to the right lower extremity and antibiotic ointment with calcium alginate to the left lower extremity all under 3 layer compression. She states that the wraps felt tight and had to take them off last night. Other than that she has no issues or complaints today. Electronic Signature(s) Signed: 09/27/2021 3:53:44 PM By: HKalman ShanDO Entered By: HKalman Shanon 09/27/2021 15:27:19 -------------------------------------------------------------------------------- Physical Exam Details Patient Name: Date of Service: [name], [name] 09/27/2021 3:00 PM Medical Record Number: [id number] Account Number: [id number] of Birth/Sex: Treating RN: 1Sep 12, 1962(61y.o. FDebby BudPrimary Care Provider: [facility], [place] Other Clinician: Referring Provider: Treating Provider/Extender: HBritt Bolognese[facility], [place] Weeks in Treatment: 2 Constitutional respirations regular, non-labored and within target range for patient.. Cardiovascular 2+ dorsalis pedis/posterior tibialis pulses. Psychiatric pleasant and cooperative. Notes Right lower extremity: 3+ pitting edema to the knee. Cobblestoning skin changes. Open wound with weeping to the anterior aspect. No increased warmth or purulent drainage noted. Left lower extremity: Scattered open wounds with granulation tissue and scant  nonviable tissue. 3+ pitting edema to the knee. No increased warmth or purulent drainage noted. Venous stasis dermatitis. Electronic Signature(s) Signed: 09/27/2021 3:53:44 PM By: HKalman ShanDO Entered By: HKalman Shanon 09/27/2021 15:30:02 -------------------------------------------------------------------------------- Physician Orders  Details Patient Name: Date of Service: [name], [name] 09/27/2021 3:00 PM Medical Record Number: [id number] Patient Account Number: [id number] Date of Birth/Sex: Treating RN: 07-12-1960 (61 y.o. [name].Reding Primary Care Provider: [facility], [place] Other Clinician: Referring Provider: Treating Provider/Extender: Britt Bolognese [facility], [place] Weeks in Treatment: 2 Verbal / Phone Orders: No Diagnosis Coding ICD-10 Coding Code Description [phone] Non-pressure chronic ulcer of other part of right lower leg with fat layer exposed L97.822 Non-pressure chronic ulcer of other part of left lower leg with fat layer exposed I89.0 Lymphedema, not elsewhere classified I87.313 Chronic venous hypertension (idiopathic) with ulcer of bilateral lower extremity J44.9 Chronic obstructive pulmonary disease, unspecified G35 Multiple sclerosis Follow-up Appointments ppointment in 1 week. - Dr. Heber [place] and [place], Room 8 Monday Return A ppointment in 2 weeks. - Dr. Heber [place] and [place], Room 8 Monday Return A Nurse Visit: - Thursday overflow Following Week Wednesday overflow Anesthetic (In clinic) Topical Lidocaine 5% applied to wound bed (In clinic) Topical Lidocaine 4% applied to wound bed Bathing/ Shower/ Hygiene May shower with protection but do not get wound dressing(s) wet. Edema Control - Lymphedema / SCD / Other Elevate legs to the level of the heart or above for 30 minutes daily and/or when sitting, a frequency of: - 3-4 times a day throughout the day. Avoid standing for long periods of time. Exercise regularly Wound  Treatment Wound #1 - Lower Leg Wound Laterality: Right, Circumferential Cleanser: Soap and Water 2 x Per Week/30 Days Discharge Instructions: May shower and wash wound with dial antibacterial soap and water prior to dressing change. Cleanser: Wound Cleanser 2 x Per Week/30 Days Discharge Instructions: Cleanse the wound with wound cleanser prior to applying a clean dressing using gauze sponges, not tissue or cotton balls. Peri-Wound Care: Triamcinolone 15 (g) 2 x Per Week/30 Days Discharge Instructions: Use triamcinolone 15 (g) as directed Peri-Wound Care: Zinc Oxide Ointment 30g tube 2 x Per Week/30 Days Discharge Instructions: Apply Zinc Oxide to periwound with each dressing change Peri-Wound Care: Sween Lotion (Moisturizing lotion) 2 x Per Week/30 Days Discharge Instructions: Apply moisturizing lotion as directed Prim Dressing: KerraCel Ag Gelling Fiber Dressing, 4x5 in (silver alginate) 2 x Per Week/30 Days ary Discharge Instructions: Apply silver alginate to wound bed as instructed Secondary Dressing: ABD Pad, 8x10 2 x Per Week/30 Days Discharge Instructions: Apply over primary dressing as directed. Secondary Dressing: CarboFLEX Odor Control Dressing, 4x4 in 2 x Per Week/30 Days Discharge Instructions: Apply over primary dressing as directed. Secondary Dressing: Zetuvit Plus 4x8 in 2 x Per Week/30 Days Discharge Instructions: Apply over primary dressing as directed. Compression Wrap: ThreePress (3 layer compression wrap) 2 x Per Week/30 Days Discharge Instructions: Apply three layer compression as directed. Wound #2 - Lower Leg Wound Laterality: Left, Circumferential Cleanser: Soap and Water 2 x Per Week/30 Days Discharge Instructions: May shower and wash wound with dial antibacterial soap and water prior to dressing change. Cleanser: Wound Cleanser 2 x Per Week/30 Days Discharge Instructions: Cleanse the wound with wound cleanser prior to applying a clean dressing using gauze sponges,  not tissue or cotton balls. Peri-Wound Care: Triamcinolone 15 (g) 2 x Per Week/30 Days Discharge Instructions: Use triamcinolone 15 (g) as directed Peri-Wound Care: Zinc Oxide Ointment 30g tube 2 x Per Week/30 Days Discharge Instructions: Apply Zinc Oxide to periwound with each dressing change Peri-Wound Care: Sween Lotion (Moisturizing lotion) 2 x Per Week/30 Days Discharge Instructions: Apply moisturizing lotion as  directed Prim Dressing: KerraCel Ag Gelling Fiber Dressing, 4x5 in (silver alginate) 2 x Per Week/30 Days ary Discharge Instructions: Apply silver alginate to wound bed as instructed Secondary Dressing: ABD Pad, 8x10 2 x Per Week/30 Days Discharge Instructions: Apply over primary dressing as directed. Secondary Dressing: CarboFLEX Odor Control Dressing, 4x4 in 2 x Per Week/30 Days Discharge Instructions: Apply over primary dressing as directed. Secondary Dressing: Zetuvit Plus 4x8 in 2 x Per Week/30 Days Discharge Instructions: Apply over primary dressing as directed. Compression Wrap: ThreePress (3 layer compression wrap) 2 x Per Week/30 Days Discharge Instructions: Apply three layer compression as directed. Electronic Signature(s) Signed: 09/27/2021 3:53:44 PM By: Kalman Shan DO Entered By: Kalman Shan on 09/27/2021 15:30:10 -------------------------------------------------------------------------------- Problem List Details Patient Name: Date of Service: [name], [name] 09/27/2021 3:00 PM Medical Record Number: [id number] Patient Account Number: [id number] Date of Birth/Sex: Treating RN: 05-16-1960 (61 y.o. [name].Reding Primary Care Provider: [facility], [place] Other Clinician: Referring Provider: Treating Provider/Extender: Britt Bolognese [facility], [place] Weeks in Treatment: 2 Active Problems ICD-10 Encounter Code Description Active Date MDM Diagnosis L97.812 Non-pressure chronic ulcer of other part of right lower leg with  fat layer 09/07/2021 No Yes exposed L97.822 Non-pressure chronic ulcer of other part of left lower leg with fat layer exposed8/29/2023 No Yes I89.0 Lymphedema, not elsewhere classified 09/07/2021 No Yes I87.313 Chronic venous hypertension (idiopathic) with ulcer of bilateral lower extremity 09/07/2021 No Yes J44.9 Chronic obstructive pulmonary disease, unspecified 09/07/2021 No Yes G35 Multiple sclerosis 09/07/2021 No Yes Inactive Problems Resolved Problems Electronic Signature(s) Signed: 09/27/2021 3:53:44 PM By: Kalman Shan DO Entered By: Kalman Shan on 09/27/2021 15:26:29 -------------------------------------------------------------------------------- Progress Note Details Patient Name: Date of Service: [name], [name] 09/27/2021 3:00 PM Medical Record Number: [id number] Patient Account Number: [id number] Date of Birth/Sex: Treating RN: [birth year] (61 y.o. [name] Primary Care Provider: [facility], [place] Other Clinician: Referring Provider: Treating Provider/Extender: Britt Bolognese [facility], [place] Weeks in Treatment: 2 Subjective Chief Complaint Information obtained from Patient 09/07/2021; bilateral lower extremity wounds History of Present Illness (HPI) Admission 09/07/2021 Ms. Nancy Bonsignore is a 61 year old female with a past medical history of COPD, multiple sclerosis and lymphedema that presents the clinic for a 43-monthhistory of nonhealing ulcers to her lower extremities bilaterally. She was recently seen in the ED on 8/11 for left lower cellulitis and given IV antibiotics and discharged on oral Augmentin. She completed this on 8/19. Patient does not wear compression stockings. She does not have a history of wounds previously. She is not on a diuretic. 9/7; patient presents for follow-up. She states that she needed to take the wraps off yesterday due to drainage coming through. We have been using silver alginate to the right lower  extremity wound and gentamicin and mupirocin ointment with calcium alginate to the left lower extremity wound all under Kerlix/Coban. She currently denies signs of infection. 9/11; patient presents for follow-up. She was able to keep the wraps in place. We have been using silver alginate to the right lower extremity and silver alginate and Iodoflex to the left lower extremity all under 3 layer compression. She notes that the Iodoflex caused her irritation. She currently denies signs of infection. 9/18; patient presents for follow-up. We have been using silver alginate to the right lower extremity and antibiotic ointment with calcium alginate to the left lower extremity all under 3 layer compression. She  states that the wraps felt tight and had to take them off last night. Other than that she has no issues or complaints today. Patient History Information obtained from Patient, Chart. Family History Cancer - Maternal Grandparents, Diabetes - Maternal Grandparents, Heart Disease - Father. Social History Current every day smoker - 1ppd, Marital Status - Married, Alcohol Use - Never, Drug Use - No History, Caffeine Use - Never. Medical History Hematologic/Lymphatic Patient has history of Anemia, Lymphedema Respiratory Patient has history of Pneumothorax - bilateral Cardiovascular Patient has history of Hypertension Musculoskeletal Patient has history of Osteoarthritis Neurologic Patient has history of Seizure Disorder Hospitalization/Surgery History - 06/2013 ORIF left toe. - bladder surgery 2007. - appendectomy. Medical A Surgical History Notes nd Constitutional Symptoms (General Health) HLA B27 positive Respiratory pulmonary nodules Gastrointestinal GERD IBS Genitourinary UTI Musculoskeletal MS Objective Constitutional respirations regular, non-labored and within target range for patient.. Vitals Time Taken: 3:50 PM, Height: 64 in, Weight: 135 lbs, BMI: 23.2, Temperature: 97.8  F, Pulse: 83 bpm, Respiratory Rate: 20 breaths/min, Blood Pressure: 134/81 mmHg. Cardiovascular 2+ dorsalis pedis/posterior tibialis pulses. Psychiatric pleasant and cooperative. General Notes: Right lower extremity: 3+ pitting edema to the knee. Cobblestoning skin changes. Open wound with weeping to the anterior aspect. No increased warmth or purulent drainage noted. Left lower extremity: Scattered open wounds with granulation tissue and scant nonviable tissue. 3+ pitting edema to the knee. No increased warmth or purulent drainage noted. Venous stasis dermatitis. Integumentary (Hair, Skin) Wound #1 status is Open. Original cause of wound was Gradually Appeared. The date acquired was: 06/04/2021. The wound has been in treatment 2 weeks. The wound is located on the Right,Circumferential Lower Leg. The wound measures 9cm length x 17cm width x 0.1cm depth; 120.166cm^2 area and 12.017cm^3 volume. There is Fat Layer (Subcutaneous Tissue) exposed. There is no tunneling or undermining noted. There is a large amount of serous drainage noted. The wound margin is distinct with the outline attached to the wound base. There is large (67-100%) red, pink granulation within the wound bed. There is no necrotic tissue within the wound bed. Wound #2 status is Open. Original cause of wound was Gradually Appeared. The date acquired was: 06/04/2021. The wound has been in treatment 2 weeks. The wound is located on the Left,Circumferential Lower Leg. The wound measures 15cm length x 13cm width x 0.1cm depth; 153.153cm^2 area and 15.315cm^3 volume. There is Fat Layer (Subcutaneous Tissue) exposed. There is no tunneling or undermining noted. There is a large amount of serosanguineous drainage noted. The wound margin is distinct with the outline attached to the wound base. There is large (67-100%) red, pink granulation within the wound bed. There is a small (1-33%) amount of necrotic tissue within the wound bed including  Adherent Slough. Assessment Active Problems ICD-10 Non-pressure chronic ulcer of other part of right lower leg with fat layer exposed Non-pressure chronic ulcer of other part of left lower leg with fat layer exposed Lymphedema, not elsewhere classified Chronic venous hypertension (idiopathic) with ulcer of bilateral lower extremity Chronic obstructive pulmonary disease, unspecified Multiple sclerosis Patient's wounds have shown improvement in size and appearance since last clinic visit. I recommended silver alginate with antibiotic ointment to all wound beds under 3 layer compression. Procedures Wound #1 Pre-procedure diagnosis of Wound #1 is a Lymphedema located on the Right,Circumferential Lower Leg . There was a Three Layer Compression Therapy Procedure by Deon Pilling, RN. Post procedure Diagnosis Wound #1: Same as Pre-Procedure Wound #2 Pre-procedure diagnosis of Wound #2 is a  Lymphedema located on the Left,Circumferential Lower Leg . There was a Three Layer Compression Therapy Procedure by Deon Pilling, RN. Post procedure Diagnosis Wound #2: Same as Pre-Procedure Plan Follow-up Appointments: Return Appointment in 1 week. - Dr. Heber [place] and [place], Room 8 Monday Return Appointment in 2 weeks. - Dr. Heber [place] and [place], Room 8 Monday Nurse Visit: - Thursday overflow Following Week Wednesday overflow Anesthetic: (In clinic) Topical Lidocaine 5% applied to wound bed (In clinic) Topical Lidocaine 4% applied to wound bed Bathing/ Shower/ Hygiene: May shower with protection but do not get wound dressing(s) wet. Edema Control - Lymphedema / SCD / Other: Elevate legs to the level of the heart or above for 30 minutes daily and/or when sitting, a frequency of: - 3-4 times a day throughout the day. Avoid standing for long periods of time. Exercise regularly WOUND #1: - Lower Leg Wound Laterality: Right, Circumferential Cleanser: Soap and Water 2 x Per Week/30 Days Discharge Instructions:  May shower and wash wound with dial antibacterial soap and water prior to dressing change. Cleanser: Wound Cleanser 2 x Per Week/30 Days Discharge Instructions: Cleanse the wound with wound cleanser prior to applying a clean dressing using gauze sponges, not tissue or cotton balls. Peri-Wound Care: Triamcinolone 15 (g) 2 x Per Week/30 Days Discharge Instructions: Use triamcinolone 15 (g) as directed Peri-Wound Care: Zinc Oxide Ointment 30g tube 2 x Per Week/30 Days Discharge Instructions: Apply Zinc Oxide to periwound with each dressing change Peri-Wound Care: Sween Lotion (Moisturizing lotion) 2 x Per Week/30 Days Discharge Instructions: Apply moisturizing lotion as directed Prim Dressing: KerraCel Ag Gelling Fiber Dressing, 4x5 in (silver alginate) 2 x Per Week/30 Days ary Discharge Instructions: Apply silver alginate to wound bed as instructed Secondary Dressing: ABD Pad, 8x10 2 x Per Week/30 Days Discharge Instructions: Apply over primary dressing as directed. Secondary Dressing: CarboFLEX Odor Control Dressing, 4x4 in 2 x Per Week/30 Days Discharge Instructions: Apply over primary dressing as directed. Secondary Dressing: Zetuvit Plus 4x8 in 2 x Per Week/30 Days Discharge Instructions: Apply over primary dressing as directed. Com pression Wrap: ThreePress (3 layer compression wrap) 2 x Per Week/30 Days Discharge Instructions: Apply three layer compression as directed. WOUND #2: - Lower Leg Wound Laterality: Left, Circumferential Cleanser: Soap and Water 2 x Per Week/30 Days Discharge Instructions: May shower and wash wound with dial antibacterial soap and water prior to dressing change. Cleanser: Wound Cleanser 2 x Per Week/30 Days Discharge Instructions: Cleanse the wound with wound cleanser prior to applying a clean dressing using gauze sponges, not tissue or cotton balls. Peri-Wound Care: Triamcinolone 15 (g) 2 x Per Week/30 Days Discharge Instructions: Use triamcinolone 15 (g) as  directed Peri-Wound Care: Zinc Oxide Ointment 30g tube 2 x Per Week/30 Days Discharge Instructions: Apply Zinc Oxide to periwound with each dressing change Peri-Wound Care: Sween Lotion (Moisturizing lotion) 2 x Per Week/30 Days Discharge Instructions: Apply moisturizing lotion as directed Prim Dressing: KerraCel Ag Gelling Fiber Dressing, 4x5 in (silver alginate) 2 x Per Week/30 Days ary Discharge Instructions: Apply silver alginate to wound bed as instructed Secondary Dressing: ABD Pad, 8x10 2 x Per Week/30 Days Discharge Instructions: Apply over primary dressing as directed. Secondary Dressing: CarboFLEX Odor Control Dressing, 4x4 in 2 x Per Week/30 Days Discharge Instructions: Apply over primary dressing as directed. Secondary Dressing: Zetuvit Plus 4x8 in 2 x Per Week/30 Days Discharge Instructions: Apply over primary dressing as directed. Com pression Wrap: ThreePress (3 layer compression wrap) 2 x Per Week/30 Days Discharge Instructions:  Apply three layer compression as directed. 1. Silver alginate with antibiotic ointment under 3 layer compression bilaterally to the lower extremities 2. Follow-up in 1 week Electronic Signature(s) Signed: 09/27/2021 3:53:44 PM By: Kalman Shan DO Entered By: Kalman Shan on 09/27/2021 15:32:02 -------------------------------------------------------------------------------- HxROS Details Patient Name: Date of Service: [name], [name] 09/27/2021 3:00 PM Medical Record Number: [id number] Patient Account Number: [id number] Date of Birth/Sex: Treating RN: [birth year] (61 y.o. [name] Primary Care Provider: [facility], [place] Other Clinician: Referring Provider: Treating Provider/Extender: Britt Bolognese [facility], [place] Weeks in Treatment: 2 Information Obtained From Patient Chart Constitutional Symptoms (General Health) Medical History: Past Medical History Notes: HLA B27  positive Hematologic/Lymphatic Medical History: Positive for: Anemia; Lymphedema Respiratory Medical History: Positive for: Pneumothorax - bilateral Past Medical History Notes: pulmonary nodules Cardiovascular Medical History: Positive for: Hypertension Gastrointestinal Medical History: Past Medical History Notes: GERD IBS Genitourinary Medical History: Past Medical History Notes: UTI Musculoskeletal Medical History: Positive for: Osteoarthritis Past Medical History Notes: MS Neurologic Medical History: Positive for: Seizure Disorder Immunizations Pneumococcal Vaccine: Received Pneumococcal Vaccination: No Implantable Devices No devices added Hospitalization / Surgery History Type of Hospitalization/Surgery 06/2013 ORIF left toe bladder surgery 2007 appendectomy Family and Social History Cancer: Yes - Maternal Grandparents; Diabetes: Yes - Maternal Grandparents; Heart Disease: Yes - Father; Current every day smoker - 1ppd; Marital Status - Married; Alcohol Use: Never; Drug Use: No History; Caffeine Use: Never; Financial Concerns: No; Food, Clothing or Shelter Needs: No; Support System Lacking: No; Transportation Concerns: No Electronic Signature(s) Signed: 09/27/2021 3:53:44 PM By: Kalman Shan DO Signed: 09/27/2021 4:49:15 PM By: Deon Pilling RN, BSN Entered By: Kalman Shan on 09/27/2021 15:27:24 -------------------------------------------------------------------------------- SuperBill Details Patient Name: Date of Service: [name], [name] 09/27/2021 Medical Record Number: [id number] Patient Account Number: [id number] Date of Birth/Sex: Treating RN: Jul 08, 1960 (61 y.o. [name] Primary Care Provider: [facility], [place] Other Clinician: Referring Provider: Treating Provider/Extender: Britt Bolognese [facility], [place] Weeks in Treatment: 2 Diagnosis Coding ICD-10 Codes Code Description [phone] Non-pressure chronic  ulcer of other part of right lower leg with fat layer exposed L97.822 Non-pressure chronic ulcer of other part of left lower leg with fat layer exposed I89.0 Lymphedema, not elsewhere classified I87.313 Chronic venous hypertension (idiopathic) with ulcer of bilateral lower extremity J44.9 Chronic obstructive pulmonary disease, unspecified G35 Multiple sclerosis Facility Procedures CPT4: Code [id number] Description: 81 BILATERAL: Application of multi-layer venous compression system; leg (below knee), including ankle and t. Modifier: Quantity: 1 Physician Procedures : CPT4 Code Description Modifier [id number] - WC PHYS LEVEL 3 - EST PT ICD-10 Diagnosis Description J88.416 Non-pressure chronic ulcer of other part of right lower leg with fat layer exposed L97.822 Non-pressure chronic ulcer of other part of left  lower leg with fat layer exposed I89.0 Lymphedema, not elsewhere classified I87.313 Chronic venous hypertension (idiopathic) with ulcer of bilateral lower extremity Quantity: 1 Electronic Signature(s) Signed: 09/27/2021 3:53:44 PM By: Kalman Shan DO Entered By: Kalman Shan on 09/27/2021 15:32:18

## 2021-09-30 ENCOUNTER — Encounter (HOSPITAL_BASED_OUTPATIENT_CLINIC_OR_DEPARTMENT_OTHER): Payer: BC Managed Care – PPO | Admitting: Internal Medicine

## 2021-09-30 DIAGNOSIS — I890 Lymphedema, not elsewhere classified: Secondary | ICD-10-CM | POA: Diagnosis not present

## 2021-09-30 DIAGNOSIS — L97812 Non-pressure chronic ulcer of other part of right lower leg with fat layer exposed: Secondary | ICD-10-CM | POA: Diagnosis not present

## 2021-09-30 DIAGNOSIS — J449 Chronic obstructive pulmonary disease, unspecified: Secondary | ICD-10-CM | POA: Diagnosis not present

## 2021-09-30 DIAGNOSIS — I87313 Chronic venous hypertension (idiopathic) with ulcer of bilateral lower extremity: Secondary | ICD-10-CM | POA: Diagnosis not present

## 2021-09-30 DIAGNOSIS — L97822 Non-pressure chronic ulcer of other part of left lower leg with fat layer exposed: Secondary | ICD-10-CM | POA: Diagnosis not present

## 2021-09-30 DIAGNOSIS — G35 Multiple sclerosis: Secondary | ICD-10-CM | POA: Diagnosis not present

## 2021-10-01 NOTE — Progress Notes (Signed)
JULEA, ABDON ([id number]) Visit Report for 09/30/2021 Arrival Information Details Patient Name: Date of Service: RO [place], [name] 09/30/2021 11:00 A M Medical Record Number: [id number] Patient Account Number: [id number] Date of Birth/Sex: Treating RN: [birth year] (61 y.o. [name], [name] Primary Care Jovanka Westgate: A [facility], [place] Other Clinician: Referring Dempsey Ahonen: Treating Dwayn Moravek/Extender: Britt Bolognese [facility], [place] Weeks in Treatment: 3 Visit Information History Since Last Visit Added or deleted any medications: No Patient Arrived: Wheel Chair Any new allergies or adverse reactions: No Arrival Time: 10:48 Had a fall or experienced change in No Accompanied By: husband activities of daily living that may affect Transfer Assistance: Manual risk of falls: Patient Identification Verified: Yes Signs or symptoms of abuse/neglect since last visito No Secondary Verification Process Completed: Yes Hospitalized since last visit: No Patient Requires Transmission-Based Precautions: No Implantable device outside of the clinic excluding No Patient Has Alerts: Yes cellular tissue based products placed in the center Patient Alerts: 5/23 ABI: L1.01 R 1.10 since last visit: 5/23 TBI: L0.75 R 0.90 Has Compression in Place as Prescribed: Yes Pain Present Now: Yes Electronic Signature(s) Signed: 10/01/2021 12:30:24 PM By: Erenest Blank Entered By: Erenest Blank on 09/30/2021 10:49:04 -------------------------------------------------------------------------------- Compression Therapy Details Patient Name: Date of Service: [name], [name] 09/30/2021 11:00 A M Medical Record Number: [id number] Patient Account Number: [id number] Date of Birth/Sex: Treating RN: 14-Dec-1960 (60 y.o. [name] Primary Care Rook Maue: A [facility], [place] Other Clinician: Referring Rubina Basinski: Treating Deosha Werden/Extender: Britt Bolognese [facility], [place]  RO Weeks in Treatment: 3 Compression Therapy Performed for Wound Assessment: Wound #1 Right,Circumferential Lower Leg Performed By: Clinician Erenest Blank, Compression Type: Three Layer Electronic Signature(s) Signed: 10/01/2021 12:30:24 PM By: Erenest Blank Entered By: Erenest Blank on 09/30/2021 12:00:01 -------------------------------------------------------------------------------- Compression Therapy Details Patient Name: Date of Service: [name], [name] 09/30/2021 11:00 A M Medical Record Number: [id number] Patient Account Number: [id number] Date of Birth/Sex: Treating RN: March 23, 1960 (61 y.o. [name] Primary Care Matisse Roskelley: Other Clinician: A [facility], [place] Referring Randall Colden: Treating Shuree Brossart/Extender: Britt Bolognese [facility], [place] Weeks in Treatment: 3 Compression Therapy Performed for Wound Assessment: Wound #2 Left,Circumferential Lower Leg Performed By: Clinician Erenest Blank, Compression Type: Three Layer Electronic Signature(s) Signed: 10/01/2021 12:30:24 PM By: Erenest Blank Entered By: Erenest Blank on 09/30/2021 12:00:01 -------------------------------------------------------------------------------- Encounter Discharge Information Details Patient Name: Date of Service: [name], [name] 09/30/2021 11:00 A M Medical Record Number: [id number] Patient Account Number: [id number] Date of Birth/Sex: Treating RN: 23-Nov-1960 (61 y.o. [name] Primary Care Giani Winther: A [facility], [place] Other Clinician: Referring Lorance Pickeral: Treating Jonta Gastineau/Extender: Britt Bolognese [facility] [place] Weeks in Treatment: 3 Encounter Discharge Information Items Discharge Condition: Stable Ambulatory Status: Wheelchair Discharge Destination: Home Transportation: Private Auto Accompanied By: husband Schedule Follow-up Appointment: Yes Clinical Summary of Care: Electronic Signature(s) Signed: 10/01/2021 12:30:24 PM  By: Erenest Blank Entered By: Erenest Blank on 09/30/2021 12:00:44 -------------------------------------------------------------------------------- Patient/Caregiver Education Details Patient Name: Date of Service: [name], [name] 9/21/2023andnbsp11:00 A M Medical Record Number: [id number] Patient Account Number: [id number] Date of Birth/Gender: Treating RN: [birth year] (61 y.o. [name] Primary Care Physician: A [facility], [place] Other Clinician: Referring Physician: Treating Physician/Extender: Britt Bolognese [facility] [place] Weeks in Treatment: 3 Education Assessment Education Provided To: Patient Education Topics Provided Electronic Signature(s)  Signed: 10/01/2021 12:30:24 PM By: Erenest Blank Entered By: Erenest Blank on 09/30/2021 12:00:25 -------------------------------------------------------------------------------- Wound Assessment Details Patient Name: Date of Service: [name], [name] 09/30/2021 11:00 A M Medical Record Number: [id number] Patient Account Number: [id number] Date of Birth/Sex: Treating RN: October 27, 1960 (61 y.o. [name], [name] Primary Care Jacquis Paxton: A [facility], [place] Other Clinician: Referring Shelda Truby: Treating Jeremie Giangrande/Extender: Britt Bolognese [facility] [place] Weeks in Treatment: 3 Wound Status Wound Number: 1 Primary Etiology: Lymphedema Wound Location: Right, Circumferential Lower Leg Wound Status: Open Wounding Event: Gradually Appeared Date Acquired: 06/04/2021 Weeks Of Treatment: 3 Clustered Wound: Yes Wound Measurements Length: (cm) 9 Width: (cm) 17 Depth: (cm) 0.1 Area: (cm) 120.166 Volume: (cm) 12.017 % Reduction in Area: 82.7% % Reduction in Volume: 82.7% Wound Description Classification: Full Thickness Without Exposed Support Structur Exudate Amount: Large Exudate Type: Serous Exudate Color: amber es Treatment Notes Wound #1 (Lower Leg) Wound Laterality: Right,  Circumferential Cleanser Soap and Water Discharge Instruction: May shower and wash wound with dial antibacterial soap and water prior to dressing change. Wound Cleanser Discharge Instruction: Cleanse the wound with wound cleanser prior to applying a clean dressing using gauze sponges, not tissue or cotton balls. Peri-Wound Care Triamcinolone 15 (g) Discharge Instruction: Use triamcinolone 15 (g) as directed Zinc Oxide Ointment 30g tube Discharge Instruction: Apply Zinc Oxide to periwound with each dressing change Sween Lotion (Moisturizing lotion) Discharge Instruction: Apply moisturizing lotion as directed Topical Primary Dressing KerraCel Ag Gelling Fiber Dressing, 4x5 in (silver alginate) Discharge Instruction: Apply silver alginate to wound bed as instructed Secondary Dressing ABD Pad, 8x10 Discharge Instruction: Apply over primary dressing as directed. CarboFLEX Odor Control Dressing, 4x4 in Discharge Instruction: Apply over primary dressing as directed. Zetuvit Plus 4x8 in Discharge Instruction: Apply over primary dressing as directed. Secured With Compression Wrap ThreePress (3 layer compression wrap) Discharge Instruction: Apply three layer compression as directed. Compression Stockings Add-Ons Electronic Signature(s) Signed: 09/30/2021 5:58:31 PM By: Deon Pilling RN, BSN Signed: 10/01/2021 12:30:24 PM By: Erenest Blank Entered By: Erenest Blank on 09/30/2021 10:54:55 -------------------------------------------------------------------------------- Wound Assessment Details Patient Name: Date of Service: [name], [name] 09/30/2021 11:00 A M Medical Record Number: [id number] Patient Account Number: [id number] Date of Birth/Sex: Treating RN: [birth year] (61 y.o. [name] Primary Care Whittaker Lenis: A [facility], [place] Other Clinician: Referring Ronica Vivian: Treating Alfreddie Consalvo/Extender: Britt Bolognese [facility] [place] Weeks in Treatment:  3 Wound Status Wound Number: 2 Primary Etiology: Lymphedema Wound Location: Left, Circumferential Lower Leg Wound Status: Open Wounding Event: Gradually Appeared Date Acquired: 06/04/2021 Weeks Of Treatment: 3 Clustered Wound: Yes Wound Measurements Length: (cm) 15 Width: (cm) 13 Depth: (cm) 0.1 Area: (cm) 153.153 Volume: (cm) 15.315 % Reduction in Area: -11.1% % Reduction in Volume: 44.4% Wound Description Classification: Full Thickness Without Exposed Support Structu Exudate Amount: Large Exudate Type: Serosanguineous Exudate Color: red, brown res Treatment Notes Wound #2 (Lower Leg) Wound Laterality: Left, Circumferential Cleanser Soap and Water Discharge Instruction: May shower and wash wound with dial antibacterial soap and water prior to dressing change. Wound Cleanser Discharge Instruction: Cleanse the wound with wound cleanser prior to applying a clean dressing using gauze sponges, not tissue or cotton balls. Peri-Wound Care Triamcinolone 15 (g) Discharge Instruction: Use triamcinolone 15 (g) as directed Zinc Oxide Ointment 30g tube Discharge Instruction: Apply Zinc Oxide to periwound with each dressing change Sween Lotion (Moisturizing lotion) Discharge Instruction: Apply moisturizing lotion as directed Topical Primary Dressing  KerraCel Ag Gelling Fiber Dressing, 4x5 in (silver alginate) Discharge Instruction: Apply silver alginate to wound bed as instructed Secondary Dressing ABD Pad, 8x10 Discharge Instruction: Apply over primary dressing as directed. CarboFLEX Odor Control Dressing, 4x4 in Discharge Instruction: Apply over primary dressing as directed. Zetuvit Plus 4x8 in Discharge Instruction: Apply over primary dressing as directed. Secured With Compression Wrap ThreePress (3 layer compression wrap) Discharge Instruction: Apply three layer compression as directed. Compression Stockings Add-Ons Electronic Signature(s) Signed: 09/30/2021 5:58:31 PM  By: Deon Pilling RN, BSN Signed: 10/01/2021 12:30:24 PM By: Erenest Blank Entered By: Erenest Blank on 09/30/2021 10:54:56 -------------------------------------------------------------------------------- Vitals Details Patient Name: Date of Service: [name], [name] 09/30/2021 11:00 A M Medical Record Number: [id number] Patient Account Number: [id number] Date of Birth/Sex: Treating RN: [birth year] (61 y.o. [name], [name] Primary Care Sosie Gato: A [facility], [place] Other Clinician: Referring Chaunte Hornbeck: Treating Georgie Haque/Extender: Britt Bolognese [facility] [place] Weeks in Treatment: 3 Vital Signs Time Taken: 10:54 Temperature (F): 97.7 Height (in): 64 Pulse (bpm): 66 Weight (lbs): 135 Respiratory Rate (breaths/min): 18 Body Mass Index (BMI): 23.2 Blood Pressure (mmHg): 126/73 Reference Range: 80 - 120 mg / dl Electronic Signature(s) Signed: 10/01/2021 12:30:24 PM By: Erenest Blank Entered By: Erenest Blank on 09/30/2021 10:54:38

## 2021-10-01 NOTE — Progress Notes (Signed)
SHANEESE, TAIT ([id number]) Visit Report for 09/30/2021 SuperBill Details Patient Name: Date of Service: RO [place], [name] 09/30/2021 Medical Record Number: [id number] Patient Account Number: [id number] Date of Birth/Sex: Treating RN: February 11, 1960 (61 y.o. 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 Electronic Signature(s) Signed: 10/01/2021 9:20:47 AM By: Kalman Shan DO Signed: 10/01/2021 12:30:24 PM By: Erenest Blank Entered By: Erenest Blank on 09/30/2021 12:01:08

## 2021-10-04 ENCOUNTER — Encounter (HOSPITAL_BASED_OUTPATIENT_CLINIC_OR_DEPARTMENT_OTHER): Payer: BC Managed Care – PPO | Admitting: Internal Medicine

## 2021-10-04 DIAGNOSIS — L97812 Non-pressure chronic ulcer of other part of right lower leg with fat layer exposed: Secondary | ICD-10-CM | POA: Diagnosis not present

## 2021-10-04 DIAGNOSIS — I87313 Chronic venous hypertension (idiopathic) with ulcer of bilateral lower extremity: Secondary | ICD-10-CM | POA: Diagnosis not present

## 2021-10-04 DIAGNOSIS — L97822 Non-pressure chronic ulcer of other part of left lower leg with fat layer exposed: Secondary | ICD-10-CM | POA: Diagnosis not present

## 2021-10-04 DIAGNOSIS — J449 Chronic obstructive pulmonary disease, unspecified: Secondary | ICD-10-CM | POA: Diagnosis not present

## 2021-10-04 DIAGNOSIS — G35 Multiple sclerosis: Secondary | ICD-10-CM | POA: Diagnosis not present

## 2021-10-04 DIAGNOSIS — I890 Lymphedema, not elsewhere classified: Secondary | ICD-10-CM | POA: Diagnosis not present

## 2021-10-04 DIAGNOSIS — S81809A Unspecified open wound, unspecified lower leg, initial encounter: Secondary | ICD-10-CM | POA: Diagnosis not present

## 2021-10-04 NOTE — Progress Notes (Signed)
[name], [name] ([id number]) Visit Report for 10/04/2021 Chief Complaint Document Details Patient Name: Date of Service: [name], [name] 10/04/2021 11:15 A M Medical Record Number: [id number] Patient Account Number: [id number] Date of Birth/Sex: Treating RN: [birth year] (61 y.o. [name] Primary Care Provider: A [facility], [place] Other Clinician: Referring Provider: Treating Provider/Extender: Britt Bolognese [facility], [place] Weeks in Treatment: 3 Information Obtained from: Patient Chief Complaint 09/07/2021; bilateral lower extremity wounds Electronic Signature(s) Signed: 10/04/2021 12:02:31 PM By: Kalman Shan DO Entered By: Kalman Shan on 10/04/2021 11:53:34 -------------------------------------------------------------------------------- Debridement Details Patient Name: Date of Service: [name], [name] 10/04/2021 11:15 A M Medical Record Number: [id number] Patient Account Number: [id number] Date of Birth/Sex: Treating RN: 1960-11-11 (61 y.o. [name], [name] Primary Care Provider: A [facility], [place] Other Clinician: Referring Provider: Treating Provider/Extender: Britt Bolognese [facility], [place] Weeks in Treatment: 3 Debridement Performed for Assessment: Wound #2 Left,Circumferential Lower Leg Performed By: Physician Kalman Shan, DO Debridement Type: Debridement Level of Consciousness (Pre-procedure): Awake and Alert Pre-procedure Verification/Time Out Yes - 11:45 Taken: Start Time: 11:46 Pain Control: Lidocaine 5% topical ointment T Area Debrided (L x W): otal 10 (cm) x 3 (cm) = 30 (cm) Tissue and other material debrided: Viable, Non-Viable, Slough, Subcutaneous, Slough Level: Skin/Subcutaneous Tissue Debridement Description: Excisional Instrument: Curette Bleeding: Minimum Hemostasis Achieved: Pressure End Time: 11:52 Procedural Pain: 0 Post Procedural Pain: 0 Response to Treatment: Procedure was  tolerated well Level of Consciousness (Post- Awake and Alert procedure): Post Debridement Measurements of Total Wound Length: (cm) 12.5 Width: (cm) 8.5 Depth: (cm) 0.1 Volume: (cm) 8.345 Character of Wound/Ulcer Post Debridement: Improved Post Procedure Diagnosis Same as Pre-procedure Electronic Signature(s) Signed: 10/04/2021 12:02:31 PM By: Kalman Shan DO Signed: 10/04/2021 5:50:10 PM By: Deon Pilling RN, BSN Entered By: Deon Pilling on 10/04/2021 11:52:49 -------------------------------------------------------------------------------- HPI Details Patient Name: Date of Service: [name], [name] 10/04/2021 11:15 A M Medical Record Number: [id number] Patient Account Number: [id number] Date of Birth/Sex: Treating RN: [birth year] (61 y.o. [name] Primary Care Provider: [facility], [place] Other Clinician: Referring Provider: Treating Provider/Extender: Britt Bolognese [facility], [place] Weeks in Treatment: 3 History of Present Illness HPI Description: Admission 09/07/2021 [name] is a 61 year old female with a past medical history of COPD, multiple sclerosis and lymphedema that presents the clinic for a 13-monthhistory of nonhealing ulcers to her lower extremities bilaterally. She was recently seen in the ED on 8/11 for left lower cellulitis and given IV antibiotics and discharged on oral Augmentin. She completed this on 8/19. Patient does not wear compression stockings. She does not have a history of wounds previously. She is not on a diuretic. 9/7; patient presents for follow-up. She states that she needed to take the wraps off yesterday due to drainage coming through. We have been using silver alginate to the right lower extremity wound and gentamicin and mupirocin ointment with calcium alginate to the left lower extremity wound all under Kerlix/Coban. She currently denies signs of infection. 9/11; patient presents for follow-up. She was  able to keep the wraps in place. We have been using silver alginate to the right lower extremity and silver alginate and Iodoflex to the left lower extremity all under 3 layer compression. She notes that the Iodoflex caused her irritation. She currently denies signs of infection. 9/18; patient presents for follow-up. We have been using  silver alginate to the right lower extremity and antibiotic ointment with calcium alginate to the left lower extremity all under 3 layer compression. She states that the wraps felt tight and had to take them off last night. Other than that she has no issues or complaints today. 9/25; patient presents for follow-up. We have been using silver alginate and with antibiotic ointment to the lower extremities bilaterally with compression. Patient has tolerated this well. She has no issues or complaints today. Electronic Signature(s) Signed: 10/04/2021 12:02:31 PM By: Kalman Shan DO Entered By: Kalman Shan on 10/04/2021 11:54:03 -------------------------------------------------------------------------------- Physical Exam Details Patient Name: Date of Service: [name], [name] 10/04/2021 11:15 A M Medical Record Number: [id number] Patient Account Number: [id number] Date of Birth/Sex: Treating RN: 11/14/1960 (61 y.o. [name] Primary Care Provider: [facility], [place] Other Clinician: Referring Provider: Treating Provider/Extender: Britt Bolognese [facility], [place] Weeks in Treatment: 3 Constitutional respirations regular, non-labored and within target range for patient.. Cardiovascular 2+ dorsalis pedis/posterior tibialis pulses. Psychiatric pleasant and cooperative. Notes Right lower extremity: 2+ pitting edema to the knee. papilomatosis cutis lymphostatica. Open wound with weeping to the anterior aspect. No increased warmth or purulent drainage noted. Left lower extremity: Scattered open wounds with granulation tissue and  scant nonviable tissue. serous drainage. 2+ pitting edema to the knee. No increased warmth or purulent drainage noted. Venous stasis dermatitis. Electronic Signature(s) Signed: 10/04/2021 12:02:31 PM By: Kalman Shan DO Entered By: Kalman Shan on 10/04/2021 11:56:55 -------------------------------------------------------------------------------- Physician Orders Details Patient Name: Date of Service: [name], [name] 10/04/2021 11:15 A M Medical Record Number: [id number] Patient Account Number: [id number] Date of Birth/Sex: Treating RN: 1960/09/18 (61 y.o. [name], [name] Primary Care Provider: A [facility], [place] Other Clinician: Referring Provider: Treating Provider/Extender: Britt Bolognese [facility], [place] Weeks in Treatment: 3 Verbal / Phone Orders: No Diagnosis Coding ICD-10 Coding Code Description [phone] Non-pressure chronic ulcer of other part of right lower leg with fat layer exposed L97.822 Non-pressure chronic ulcer of other part of left lower leg with fat layer exposed I89.0 Lymphedema, not elsewhere classified I87.313 Chronic venous hypertension (idiopathic) with ulcer of bilateral lower extremity J44.9 Chronic obstructive pulmonary disease, unspecified G35 Multiple sclerosis Follow-up Appointments ppointment in 1 week. - Dr. Heber [place] and [place], Room 8 Monday Return A ppointment in 2 weeks. - Dr. Heber [place] and [place], Room 8 Monday Return A Nurse Visit: - Wednesday overflow Anesthetic (In clinic) Topical Lidocaine 5% applied to wound bed Bathing/ Shower/ Hygiene May shower with protection but do not get wound dressing(s) wet. Edema Control - Lymphedema / SCD / Other Lymphedema Pumps. Use Lymphedema pumps on leg(s) 2-3 times a day for 45-60 minutes. If wearing any wraps or hose, do not remove them. Continue exercising as instructed. - wound center to place an order for lymphedema pumps- [employer]. Elevate legs to the level of the  heart or above for 30 minutes daily and/or when sitting, a frequency of: - 3-4 times a day throughout the day. Avoid standing for long periods of time. Exercise regularly Compression stocking or Garment 30-40 mm/Hg pressure to: - order juxtalites HD for both legs. Wound Treatment Wound #1 - Lower Leg Wound Laterality: Right, Circumferential Cleanser: Soap and Water 2 x Per Week/30 Days Discharge Instructions: May shower and wash wound with dial antibacterial soap and water prior to dressing change. Cleanser: Wound Cleanser 2 x Per Week/30 Days Discharge Instructions: Cleanse  the wound with wound cleanser prior to applying a clean dressing using gauze sponges, not tissue or cotton balls. Peri-Wound Care: Triamcinolone 15 (g) 2 x Per Week/30 Days Discharge Instructions: Use triamcinolone 15 (g) as directed Peri-Wound Care: Zinc Oxide Ointment 30g tube 2 x Per Week/30 Days Discharge Instructions: Apply Zinc Oxide to periwound with each dressing change Peri-Wound Care: Sween Lotion (Moisturizing lotion) 2 x Per Week/30 Days Discharge Instructions: Apply moisturizing lotion as directed Prim Dressing: KerraCel Ag Gelling Fiber Dressing, 4x5 in (silver alginate) 2 x Per Week/30 Days ary Discharge Instructions: Apply silver alginate to wound bed as instructed Secondary Dressing: ABD Pad, 8x10 2 x Per Week/30 Days Discharge Instructions: Apply over primary dressing as directed. Secondary Dressing: CarboFLEX Odor Control Dressing, 4x4 in 2 x Per Week/30 Days Discharge Instructions: Apply over primary dressing as directed. Secondary Dressing: Zetuvit Plus 4x8 in 2 x Per Week/30 Days Discharge Instructions: Apply over primary dressing as directed. Compression Wrap: ThreePress (3 layer compression wrap) 2 x Per Week/30 Days Discharge Instructions: Apply three layer compression as directed. Compression Stockings: Circaid Juxta Lite Compression Wrap (DME) Right Leg Compression Amount: 30-40  mmHG Discharge Instructions: Apply Circaid Juxta Lite Compression Wrap daily as instructed. Apply first thing in the morning, remove at night before bed. Wound #2 - Lower Leg Wound Laterality: Left, Circumferential Cleanser: Soap and Water 2 x Per Week/30 Days Discharge Instructions: May shower and wash wound with dial antibacterial soap and water prior to dressing change. Cleanser: Wound Cleanser 2 x Per Week/30 Days Discharge Instructions: Cleanse the wound with wound cleanser prior to applying a clean dressing using gauze sponges, not tissue or cotton balls. Peri-Wound Care: Triamcinolone 15 (g) 2 x Per Week/30 Days Discharge Instructions: Use triamcinolone 15 (g) as directed Peri-Wound Care: Zinc Oxide Ointment 30g tube 2 x Per Week/30 Days Discharge Instructions: Apply Zinc Oxide to periwound with each dressing change Peri-Wound Care: Sween Lotion (Moisturizing lotion) 2 x Per Week/30 Days Discharge Instructions: Apply moisturizing lotion as directed Prim Dressing: KerraCel Ag Gelling Fiber Dressing, 4x5 in (silver alginate) 2 x Per Week/30 Days ary Discharge Instructions: Apply silver alginate to wound bed as instructed Secondary Dressing: ABD Pad, 8x10 2 x Per Week/30 Days Discharge Instructions: Apply over primary dressing as directed. Secondary Dressing: CarboFLEX Odor Control Dressing, 4x4 in 2 x Per Week/30 Days Discharge Instructions: Apply over primary dressing as directed. Secondary Dressing: Zetuvit Plus 4x8 in 2 x Per Week/30 Days Discharge Instructions: Apply over primary dressing as directed. Compression Wrap: ThreePress (3 layer compression wrap) 2 x Per Week/30 Days Discharge Instructions: Apply three layer compression as directed. Compression Stockings: Circaid Juxta Lite Compression Wrap (DME) Left Leg Compression Amount: 30-40 mmHG Discharge Instructions: Apply Circaid Juxta Lite Compression Wrap daily as instructed. Apply first thing in the morning, remove at night  before bed. Electronic Signature(s) Signed: 10/04/2021 12:02:31 PM By: Kalman Shan DO Entered By: Kalman Shan on 10/04/2021 11:57:02 -------------------------------------------------------------------------------- Problem List Details Patient Name: Date of Service: [name], [name] 10/04/2021 11:15 A M Medical Record Number: [id number] Patient Account Number: [id number] Date of Birth/Sex: Treating RN: 20-Jan-1960 (61 y.o. [name] Primary Care Provider: [facility], [place] Other Clinician: Referring Provider: Treating Provider/Extender: Britt Bolognese [facility], [place] Weeks in Treatment: 3 Active Problems ICD-10 Encounter Code Description Active Date MDM Diagnosis L97.812 Non-pressure chronic ulcer of other part of right lower leg with fat layer 09/07/2021 No Yes exposed L97.822 Non-pressure chronic ulcer  of other part of left lower leg with fat layer exposed8/29/2023 No Yes I89.0 Lymphedema, not elsewhere classified 09/07/2021 No Yes I87.313 Chronic venous hypertension (idiopathic) with ulcer of bilateral lower extremity 09/07/2021 No Yes J44.9 Chronic obstructive pulmonary disease, unspecified 09/07/2021 No Yes G35 Multiple sclerosis 09/07/2021 No Yes Inactive Problems Resolved Problems Electronic Signature(s) Signed: 10/04/2021 12:02:31 PM By: Kalman Shan DO Entered By: Kalman Shan on 10/04/2021 11:53:21 -------------------------------------------------------------------------------- Progress Note Details Patient Name: Date of Service: [name], [name] 10/04/2021 11:15 A M Medical Record Number: [id number] Patient Account Number: [id number] Date of Birth/Sex: Treating RN: 04-13-1960 (61 y.o. [name] Primary Care Provider: A [facility], [place] Other Clinician: Referring Provider: Treating Provider/Extender: Britt Bolognese [facility], [place] Weeks in Treatment: 3 Subjective Chief Complaint Information  obtained from Patient 09/07/2021; bilateral lower extremity wounds History of Present Illness (HPI) Admission 09/07/2021 Ms. Nancy Morrissey is a 61 year old female with a past medical history of COPD, multiple sclerosis and lymphedema that presents the clinic for a 5-monthhistory of nonhealing ulcers to her lower extremities bilaterally. She was recently seen in the ED on 8/11 for left lower cellulitis and given IV antibiotics and discharged on oral Augmentin. She completed this on 8/19. Patient does not wear compression stockings. She does not have a history of wounds previously. She is not on a diuretic. 9/7; patient presents for follow-up. She states that she needed to take the wraps off yesterday due to drainage coming through. We have been using silver alginate to the right lower extremity wound and gentamicin and mupirocin ointment with calcium alginate to the left lower extremity wound all under Kerlix/Coban. She currently denies signs of infection. 9/11; patient presents for follow-up. She was able to keep the wraps in place. We have been using silver alginate to the right lower extremity and silver alginate and Iodoflex to the left lower extremity all under 3 layer compression. She notes that the Iodoflex caused her irritation. She currently denies signs of infection. 9/18; patient presents for follow-up. We have been using silver alginate to the right lower extremity and antibiotic ointment with calcium alginate to the left lower extremity all under 3 layer compression. She states that the wraps felt tight and had to take them off last night. Other than that she has no issues or complaints today. 9/25; patient presents for follow-up. We have been using silver alginate and with antibiotic ointment to the lower extremities bilaterally with compression. Patient has tolerated this well. She has no issues or complaints today. Patient History Information obtained from Patient, Chart. Family  History Cancer - Maternal Grandparents, Diabetes - Maternal Grandparents, Heart Disease - Father. Social History Current every day smoker - 1ppd, Marital Status - Married, Alcohol Use - Never, Drug Use - No History, Caffeine Use - Never. Medical History Hematologic/Lymphatic Patient has history of Anemia, Lymphedema Respiratory Patient has history of Pneumothorax - bilateral Cardiovascular Patient has history of Hypertension Musculoskeletal Patient has history of Osteoarthritis Neurologic Patient has history of Seizure Disorder Hospitalization/Surgery History - 06/2013 ORIF left toe. - bladder surgery 2007. - appendectomy. Medical A Surgical History Notes nd Constitutional Symptoms (General Health) HLA B27 positive Respiratory pulmonary nodules Gastrointestinal GERD IBS Genitourinary UTI Musculoskeletal MS Objective Constitutional respirations regular, non-labored and within target range for patient.. Vitals Time Taken: 11:13 AM, Height: 64 in, Weight: 135 lbs, BMI: 23.2, Temperature: 97.6 F, Pulse: 77 bpm, Respiratory Rate: 18 breaths/min, Blood Pressure: 134/76 mmHg. Cardiovascular 2+ dorsalis pedis/posterior  tibialis pulses. Psychiatric pleasant and cooperative. General Notes: Right lower extremity: 2+ pitting edema to the knee. papilomatosis cutis lymphostatica. Open wound with weeping to the anterior aspect. No increased warmth or purulent drainage noted. Left lower extremity: Scattered open wounds with granulation tissue and scant nonviable tissue. serous drainage. 2+ pitting edema to the knee. No increased warmth or purulent drainage noted. Venous stasis dermatitis. Integumentary (Hair, Skin) Wound #1 status is Open. Original cause of wound was Gradually Appeared. The date acquired was: 06/04/2021. The wound has been in treatment 3 weeks. The wound is located on the Right,Circumferential Lower Leg. The wound measures 8cm length x 5cm width x 0.1cm depth; 31.416cm^2  area and 3.142cm^3 volume. There is Fat Layer (Subcutaneous Tissue) exposed. There is no tunneling or undermining noted. There is a medium amount of serosanguineous drainage noted. The wound margin is distinct with the outline attached to the wound base. There is large (67-100%) red, pink granulation within the wound bed. Wound #2 status is Open. Original cause of wound was Gradually Appeared. The date acquired was: 06/04/2021. The wound has been in treatment 3 weeks. The wound is located on the Left,Circumferential Lower Leg. The wound measures 12.5cm length x 8.5cm width x 0.1cm depth; 83.449cm^2 area and 8.345cm^3 volume. There is Fat Layer (Subcutaneous Tissue) exposed. There is no tunneling or undermining noted. There is a medium amount of serosanguineous drainage noted. The wound margin is distinct with the outline attached to the wound base. There is small (1-33%) red, pink granulation within the wound bed. There is a large (67-100%) amount of necrotic tissue within the wound bed including Adherent Slough. Assessment Active Problems ICD-10 Non-pressure chronic ulcer of other part of right lower leg with fat layer exposed Non-pressure chronic ulcer of other part of left lower leg with fat layer exposed Lymphedema, not elsewhere classified Chronic venous hypertension (idiopathic) with ulcer of bilateral lower extremity Chronic obstructive pulmonary disease, unspecified Multiple sclerosis Patient's wounds have shown improvement in size and appearance since last clinic visit. I debrided nonviable tissue. I recommended continuing the course with antibiotic ointment and silver alginate under compression therapy. She has had 4 weeks of treatment with persistent wounds despite compression therapy, regular elevation and exercise. She has stage 2 lymphedema secondary to chronic venous insufficiency and would benefit from lymphedema pumps. We will also order her Velcro compression garments to use for  when she heals her wounds. Procedures Wound #2 Pre-procedure diagnosis of Wound #2 is a Lymphedema located on the Left,Circumferential Lower Leg . There was a Excisional Skin/Subcutaneous Tissue Debridement with a total area of 30 sq cm performed by Kalman Shan, DO. With the following instrument(s): Curette to remove Viable and Non-Viable tissue/material. Material removed includes Subcutaneous Tissue and Slough and after achieving pain control using Lidocaine 5% topical ointment. A time out was conducted at 11:45, prior to the start of the procedure. A Minimum amount of bleeding was controlled with Pressure. The procedure was tolerated well with a pain level of 0 throughout and a pain level of 0 following the procedure. Post Debridement Measurements: 12.5cm length x 8.5cm width x 0.1cm depth; 8.345cm^3 volume. Character of Wound/Ulcer Post Debridement is improved. Post procedure Diagnosis Wound #2: Same as Pre-Procedure Pre-procedure diagnosis of Wound #2 is a Lymphedema located on the Left,Circumferential Lower Leg . There was a Three Layer Compression Therapy Procedure by Deon Pilling, RN. Post procedure Diagnosis Wound #2: Same as Pre-Procedure Wound #1 Pre-procedure diagnosis of Wound #1 is a Lymphedema located on the  Right,Circumferential Lower Leg . There was a Three Layer Compression Therapy Procedure by Deon Pilling, RN. Post procedure Diagnosis Wound #1: Same as Pre-Procedure Plan Follow-up Appointments: Return Appointment in 1 week. - Dr. Heber [place] and [place], Room 8 Monday Return Appointment in 2 weeks. - Dr. Heber [place] and [place], Room 8 Monday Nurse Visit: - Wednesday overflow Anesthetic: (In clinic) Topical Lidocaine 5% applied to wound bed Bathing/ Shower/ Hygiene: May shower with protection but do not get wound dressing(s) wet. Edema Control - Lymphedema / SCD / Other: Lymphedema Pumps. Use Lymphedema pumps on leg(s) 2-3 times a day for 45-60 minutes. If wearing any wraps  or hose, do not remove them. Continue exercising as instructed. - wound center to place an order for lymphedema pumps- [employer]. Elevate legs to the level of the heart or above for 30 minutes daily and/or when sitting, a frequency of: - 3-4 times a day throughout the day. Avoid standing for long periods of time. Exercise regularly Compression stocking or Garment 30-40 mm/Hg pressure to: - order juxtalites HD for both legs. WOUND #1: - Lower Leg Wound Laterality: Right, Circumferential Cleanser: Soap and Water 2 x Per Week/30 Days Discharge Instructions: May shower and wash wound with dial antibacterial soap and water prior to dressing change. Cleanser: Wound Cleanser 2 x Per Week/30 Days Discharge Instructions: Cleanse the wound with wound cleanser prior to applying a clean dressing using gauze sponges, not tissue or cotton balls. Peri-Wound Care: Triamcinolone 15 (g) 2 x Per Week/30 Days Discharge Instructions: Use triamcinolone 15 (g) as directed Peri-Wound Care: Zinc Oxide Ointment 30g tube 2 x Per Week/30 Days Discharge Instructions: Apply Zinc Oxide to periwound with each dressing change Peri-Wound Care: Sween Lotion (Moisturizing lotion) 2 x Per Week/30 Days Discharge Instructions: Apply moisturizing lotion as directed Prim Dressing: KerraCel Ag Gelling Fiber Dressing, 4x5 in (silver alginate) 2 x Per Week/30 Days ary Discharge Instructions: Apply silver alginate to wound bed as instructed Secondary Dressing: ABD Pad, 8x10 2 x Per Week/30 Days Discharge Instructions: Apply over primary dressing as directed. Secondary Dressing: CarboFLEX Odor Control Dressing, 4x4 in 2 x Per Week/30 Days Discharge Instructions: Apply over primary dressing as directed. Secondary Dressing: Zetuvit Plus 4x8 in 2 x Per Week/30 Days Discharge Instructions: Apply over primary dressing as directed. Com pression Wrap: ThreePress (3 layer compression wrap) 2 x Per Week/30 Days Discharge Instructions:  Apply three layer compression as directed. Com pression Stockings: Circaid Juxta Lite Compression Wrap (DME) Compression Amount: 30-40 mmHg (right) Discharge Instructions: Apply Circaid Juxta Lite Compression Wrap daily as instructed. Apply first thing in the morning, remove at night before bed. WOUND #2: - Lower Leg Wound Laterality: Left, Circumferential Cleanser: Soap and Water 2 x Per Week/30 Days Discharge Instructions: May shower and wash wound with dial antibacterial soap and water prior to dressing change. Cleanser: Wound Cleanser 2 x Per Week/30 Days Discharge Instructions: Cleanse the wound with wound cleanser prior to applying a clean dressing using gauze sponges, not tissue or cotton balls. Peri-Wound Care: Triamcinolone 15 (g) 2 x Per Week/30 Days Discharge Instructions: Use triamcinolone 15 (g) as directed Peri-Wound Care: Zinc Oxide Ointment 30g tube 2 x Per Week/30 Days Discharge Instructions: Apply Zinc Oxide to periwound with each dressing change Peri-Wound Care: Sween Lotion (Moisturizing lotion) 2 x Per Week/30 Days Discharge Instructions: Apply moisturizing lotion as directed Prim Dressing: KerraCel Ag Gelling Fiber Dressing, 4x5 in (silver alginate) 2 x Per Week/30 Days ary Discharge Instructions: Apply silver alginate to wound bed as  instructed Secondary Dressing: ABD Pad, 8x10 2 x Per Week/30 Days Discharge Instructions: Apply over primary dressing as directed. Secondary Dressing: CarboFLEX Odor Control Dressing, 4x4 in 2 x Per Week/30 Days Discharge Instructions: Apply over primary dressing as directed. Secondary Dressing: Zetuvit Plus 4x8 in 2 x Per Week/30 Days Discharge Instructions: Apply over primary dressing as directed. Com pression Wrap: ThreePress (3 layer compression wrap) 2 x Per Week/30 Days Discharge Instructions: Apply three layer compression as directed. Com pression Stockings: Circaid Juxta Lite Compression Wrap (DME) Compression Amount: 30-40 mmHg  (left) Discharge Instructions: Apply Circaid Juxta Lite Compression Wrap daily as instructed. Apply first thing in the morning, remove at night before bed. 1. Gentamicin/mupirocin with silver alginate under 3 layer compression 2. Order lymphedema pumps 3. Order juxta lite compression 4. Follow-up in 1 week, This week nurse visit for wrap change Electronic Signature(s) Signed: 10/04/2021 12:02:31 PM By: Kalman Shan DO Entered By: Kalman Shan on 10/04/2021 12:01:34 -------------------------------------------------------------------------------- HxROS Details Patient Name: Date of Service: [name], [name] 10/04/2021 11:15 A M Medical Record Number: [id number] Patient Account Number: [id number] Date of Birth/Sex: Treating RN: 03/19/1960 (61 y.o. [name] Primary Care Provider: [facility], [place] Other Clinician: Referring Provider: Treating Provider/Extender: Britt Bolognese [facility], [place] Weeks in Treatment: 3 Information Obtained From Patient Chart Constitutional Symptoms (General Health) Medical History: Past Medical History Notes: HLA B27 positive Hematologic/Lymphatic Medical History: Positive for: Anemia; Lymphedema Respiratory Medical History: Positive for: Pneumothorax - bilateral Past Medical History Notes: pulmonary nodules Cardiovascular Medical History: Positive for: Hypertension Gastrointestinal Medical History: Past Medical History Notes: GERD IBS Genitourinary Medical History: Past Medical History Notes: UTI Musculoskeletal Medical History: Positive for: Osteoarthritis Past Medical History Notes: MS Neurologic Medical History: Positive for: Seizure Disorder Immunizations Pneumococcal Vaccine: Received Pneumococcal Vaccination: No Implantable Devices No devices added Hospitalization / Surgery History Type of Hospitalization/Surgery 06/2013 [facility] left toe bladder surgery 2007 appendectomy Family and  Social History Cancer: Yes - Maternal Grandparents; Diabetes: Yes - Maternal Grandparents; Heart Disease: Yes - Father; Current every day smoker - 1ppd; Marital Status - Married; Alcohol Use: Never; Drug Use: No History; Caffeine Use: Never; Financial Concerns: No; Food, Clothing or Shelter Needs: No; Support System Lacking: No; Transportation Concerns: No Electronic Signature(s) Signed: 10/04/2021 12:02:31 PM By: Kalman Shan DO Signed: 10/04/2021 5:50:10 PM By: Deon Pilling RN, BSN Entered By: Kalman Shan on 10/04/2021 11:54:08 -------------------------------------------------------------------------------- SuperBill Details Patient Name: Date of Service: [name], [name] 10/04/2021 Medical Record Number: [id number] Patient Account Number: [id number] Date of Birth/Sex: Treating RN: 10-Nov-1960 (61 y.o. 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 J44.9 Chronic obstructive pulmonary disease, unspecified G35 Multiple sclerosis Facility Procedures CPT4 Code: [id number] Description: 46659 - DEB SUBQ TISSUE 20 SQ CM/< ICD-10 Diagnosis Description L97.822 Non-pressure chronic ulcer of other part of left lower leg with fat layer exposed Modifier: Quantity: 1 CPT4 Code: [id number] Description: 93903 - DEB SUBQ TISS EA ADDL 20CM ICD-10 Diagnosis Description L97.822 Non-pressure chronic ulcer of other part of left lower leg  with fat layer exposed Modifier: Quantity: 1 CPT4  Code: [id number] Description: (Facility Use Only) [id number] - APPLY MULTLAY COMPRS LWR RT LEG Modifier: 59 Quantity: 1 Physician Procedures : CPT4 Code Description Modifier [id number] 11042 - WC PHYS SUBQ TISS 20 SQ CM ICD-10 Diagnosis Description L97.822 Non-pressure chronic ulcer of other part of left lower leg with fat layer exposed Quantity: 1 : [id number] 92524 - WC PHYS SUBQ TISS EA ADDL 20 CM ICD-10 Diagnosis Description L97.822 Non-pressure chronic ulcer of other part of left lower leg with fat layer exposed Quantity: 1 Electronic Signature(s) Signed: 10/04/2021 12:02:31 PM By: Kalman Shan DO Entered By: Kalman Shan on 10/04/2021 12:01:42

## 2021-10-05 DIAGNOSIS — N6322 Unspecified lump in the left breast, upper inner quadrant: Secondary | ICD-10-CM | POA: Diagnosis not present

## 2021-10-05 DIAGNOSIS — D649 Anemia, unspecified: Secondary | ICD-10-CM | POA: Diagnosis not present

## 2021-10-05 DIAGNOSIS — Z23 Encounter for immunization: Secondary | ICD-10-CM | POA: Diagnosis not present

## 2021-10-05 DIAGNOSIS — I700 Atherosclerosis of aorta: Secondary | ICD-10-CM | POA: Diagnosis not present

## 2021-10-05 DIAGNOSIS — E041 Nontoxic single thyroid nodule: Secondary | ICD-10-CM | POA: Diagnosis not present

## 2021-10-05 NOTE — Progress Notes (Signed)
MURTIS, TEN ([id number]) Visit Report for 10/04/2021 Arrival Information Details Patient Name: Date of Service: RO [place], [name] 10/04/2021 11:15 A M Medical Record Number: [id number] Patient Account Number: [id number] Date of Birth/Sex: Treating RN: 1960-03-13 (61 y.o. Tonita Phoenix, Lauren Primary Care Madi Bonfiglio: A [facility] [place] Other Clinician: Referring Kaidon Kinker: Treating Misty Rago/Extender: Britt Bolognese [facility] [place] Weeks in Treatment: 3 Visit Information History Since Last Visit Added or deleted any medications: No Patient Arrived: Wheel Chair Any new allergies or adverse reactions: No Arrival Time: 11:11 Had a fall or experienced change in No Accompanied By: Husband activities of daily living that may affect Transfer Assistance: Manual risk of falls: Patient Identification Verified: Yes Signs or symptoms of abuse/neglect since last visito No Secondary Verification Process Completed: Yes Hospitalized since last visit: No Patient Requires Transmission-Based Precautions: No Implantable device outside of the clinic excluding No Patient Has Alerts: Yes cellular tissue based products placed in the center Patient Alerts: 5/23 ABI: L1.01 R 1.10 since last visit: 5/23 TBI: L0.75 R 0.90 Has Compression in Place as Prescribed: Yes Pain Present Now: No Electronic Signature(s) Signed: 10/05/2021 4:47:31 PM By: Rhae Hammock RN Entered By: Rhae Hammock on 10/04/2021 11:11:52 -------------------------------------------------------------------------------- Compression Therapy Details Patient Name: Date of Service: [name], [name] 10/04/2021 11:15 A M Medical Record Number: [id number] Patient Account Number: [id number] Date of Birth/Sex: Treating RN: July 24, 1960 (61 y.o. [name] Primary Care Dodger Sinning: A [facility] [place] Other Clinician: Referring Jalaiya Oyster: Treating Beulah Matusek/Extender: Britt Bolognese [facility]  [place] Weeks in Treatment: 3 Compression Therapy Performed for Wound Assessment: Wound #1 Right,Circumferential Lower Leg Performed By: Clinician Deon Pilling, RN Compression Type: Three Layer Post Procedure Diagnosis Same as Pre-procedure Electronic Signature(s) Signed: 10/04/2021 5:50:10 PM By: Deon Pilling RN, BSN Entered By: Deon Pilling on 10/04/2021 11:52:02 -------------------------------------------------------------------------------- Compression Therapy Details Patient Name: Date of Service: [name], [name] 10/04/2021 11:15 A M Medical Record Number: [id number] Patient Account Number: [id number] Date of Birth/Sex: Treating RN: 1960-02-12 (61 y.o. [name] Primary Care Jacinda Kanady: A [facility] [place] Other Clinician: Referring Clora Ohmer: Treating Augusten Lipkin/Extender: Britt Bolognese [facility] [place] Weeks in Treatment: 3 Compression Therapy Performed for Wound Assessment: Wound #2 Left,Circumferential Lower Leg Performed By: Clinician Deon Pilling, RN Compression Type: Three Layer Post Procedure Diagnosis Same as Pre-procedure Electronic Signature(s) Signed: 10/04/2021 5:50:10 PM By: Deon Pilling RN, BSN Entered By: Deon Pilling on 10/04/2021 11:52:02 -------------------------------------------------------------------------------- Encounter Discharge Information Details Patient Name: Date of Service: [name], [name] 10/04/2021 11:15 A M Medical Record Number: [id number] Patient Account Number: [id number] Date of Birth/Sex: Treating RN: 1960/05/16 (61 y.o. [name] Primary Care Persia Lintner: A [facility] [place] Other Clinician: Referring Mackie Goon: Treating Candid Bovey/Extender: Britt Bolognese [facility] [place] Weeks in Treatment: 3 Encounter Discharge Information Items Post Procedure Vitals Discharge Condition: Stable Temperature (F): 97.6 Ambulatory Status: Wheelchair Pulse (bpm): 77 Discharge  Destination: Home Respiratory Rate (breaths/min): 20 Transportation: Private Auto Blood Pressure (mmHg): 134/76 Accompanied By: husband Schedule Follow-up Appointment: Yes Clinical Summary of Care: Electronic Signature(s) Signed: 10/04/2021 5:50:10 PM By: Deon Pilling RN, BSN Entered By: Deon Pilling on 10/04/2021 11:56:06 -------------------------------------------------------------------------------- Lower Extremity Assessment Details Patient Name: Date of Service: [name], [name] 10/04/2021 11:15 A M Medical Record Number: [id number] Patient Account Number: [id number] Date of Birth/Sex: Treating RN: 04-Nov-1960 (61 y.o. F)  Deon Pilling Primary Care Olin Gurski: A [facility] [place] Other Clinician: Referring Muhammed Teutsch: Treating Floris Neuhaus/Extender: Kalman Shan A [facility] [place] Weeks in Treatment: 3 Edema Assessment Assessed: [Left: Yes] [Right: Yes] Edema: [Left: Yes] [Right: Yes] Calf Left: Right: Point of Measurement: 27 cm From Medial Instep 36.4 cm 38 cm Ankle Left: Right: Point of Measurement: 9 cm From Medial Instep 24.4 cm 25.5 cm Knee To Floor Left: Right: From Medial Instep 39 cm 39 cm Vascular Assessment Pulses: Dorsalis Pedis Palpable: [Left:Yes] [Right:Yes] Electronic Signature(s) Signed: 10/04/2021 5:50:10 PM By: Deon Pilling RN, BSN Entered By: Deon Pilling on 10/04/2021 11:53:41 -------------------------------------------------------------------------------- Multi Wound Chart Details Patient Name: Date of Service: [name], [name] 10/04/2021 11:15 A M Medical Record Number: [id number] Patient Account Number: [id number] Date of Birth/Sex: Treating RN: 24-Aug-1960 (61 y.o. [name] Primary Care Zahira Brummond: A [facility] [place] Other Clinician: Referring Brexlee Heberlein: Treating Jimmye Wisnieski/Extender: Britt Bolognese [facility] [place] Weeks in Treatment: 3 Vital Signs Height(in): 64 Pulse(bpm): 44 Weight(lbs):  135 Blood Pressure(mmHg): 134/76 Body Mass Index(BMI): 23.2 Temperature(F): 97.6 Respiratory Rate(breaths/min): 18 Photos: [N/A:N/A] Right, Circumferential Lower Leg Left, Circumferential Lower Leg N/A Wound Location: Gradually Appeared Gradually Appeared N/A Wounding Event: Lymphedema Lymphedema N/A Primary Etiology: Anemia, Lymphedema, Pneumothorax, Anemia, Lymphedema, Pneumothorax, N/A Comorbid History: Hypertension, Osteoarthritis, Seizure Hypertension, Osteoarthritis, Seizure Disorder Disorder 06/04/2021 06/04/2021 N/A Date Acquired: 3 3 N/A Weeks of Treatment: Open Open N/A Wound Status: No No N/A Wound Recurrence: Yes Yes N/A Clustered Wound: 1 3 N/A Clustered Quantity: 8x5x0.1 12.5x8.5x0.1 N/A Measurements L x W x D (cm) 31.416 83.449 N/A A (cm) : rea 3.142 8.345 N/A Volume (cm) : 95.50% 39.50% N/A % Reduction in Area: 95.50% 69.70% N/A % Reduction in Volume: Full Thickness Without Exposed Full Thickness Without Exposed N/A Classification: Support Structures Support Structures Medium Medium N/A Exudate Amount: Serosanguineous Serosanguineous N/A Exudate Type: red, brown red, brown N/A Exudate Color: Distinct, outline attached Distinct, outline attached N/A Wound Margin: Large (67-100%) Small (1-33%) N/A Granulation Amount: Red, Pink Red, Pink N/A Granulation Quality: N/A Large (67-100%) N/A Necrotic Amount: Fat Layer (Subcutaneous Tissue): Yes Fat Layer (Subcutaneous Tissue): Yes N/A Exposed Structures: Fascia: No Fascia: No Tendon: No Tendon: No Muscle: No Muscle: No Joint: No Joint: No Bone: No Bone: No Small (1-33%) Small (1-33%) N/A Epithelialization: N/A Debridement - Excisional N/A Debridement: Pre-procedure Verification/Time Out N/A 11:45 N/A Taken: N/A Lidocaine 5% topical ointment N/A Pain Control: N/A Subcutaneous, Slough N/A Tissue Debrided: N/A Skin/Subcutaneous Tissue N/A Level: N/A 30 N/A Debridement A (sq  cm): rea N/A Curette N/A Instrument: N/A Minimum N/A Bleeding: N/A Pressure N/A Hemostasis A chieved: N/A 0 N/A Procedural Pain: N/A 0 N/A Post Procedural Pain: N/A Procedure was tolerated well N/A Debridement Treatment Response: N/A 12.5x8.5x0.1 N/A Post Debridement Measurements L x W x D (cm) N/A 8.345 N/A Post Debridement Volume: (cm) Compression Therapy Compression Therapy N/A Procedures Performed: Debridement Treatment Notes Electronic Signature(s) Signed: 10/04/2021 12:02:31 PM By: Kalman Shan DO Signed: 10/04/2021 5:50:10 PM By: Deon Pilling RN, BSN Entered By: Kalman Shan on 10/04/2021 11:53:28 -------------------------------------------------------------------------------- Multi-Disciplinary Care Plan Details Patient Name: Date of Service: [name], [name] 10/04/2021 11:15 A M Medical Record Number: [id number] Patient Account Number: [id number] Date of Birth/Sex: Treating RN: 1960/08/29 (61 y.o. [name] Primary Care Pao Haffey: A [facility] [place] Other Clinician: Referring Amier Hoyt: Treating Pax Reasoner/Extender: Britt Bolognese [facility] [place] Weeks in Treatment:  3 Active Inactive Venous Leg Ulcer Nursing Diagnoses: Knowledge deficit related to disease process and management Goals: Patient will maintain optimal edema control Date Initiated: 09/07/2021 Target Resolution Date: 11/05/2021 Goal Status: Active Interventions: Assess peripheral edema status every visit. Compression as ordered Treatment Activities: Therapeutic compression applied : 09/07/2021 Notes: 09/20/21: Edema control ongoing, using 3 layer wrap. Wound/Skin Impairment Nursing Diagnoses: Knowledge deficit related to ulceration/compromised skin integrity Goals: Patient/caregiver will verbalize understanding of skin care regimen Date Initiated: 09/07/2021 Target Resolution Date: 11/05/2021 Goal Status: Active Interventions: Assess patient/caregiver  ability to perform ulcer/skin care regimen upon admission and as needed Assess ulceration(s) every visit Provide education on ulcer and skin care Treatment Activities: Skin care regimen initiated : 09/07/2021 Topical wound management initiated : 09/07/2021 Notes: 09/20/21: Wound care regimen continues. Electronic Signature(s) Signed: 10/04/2021 5:50:10 PM By: Deon Pilling RN, BSN Entered By: Deon Pilling on 10/04/2021 11:51:41 -------------------------------------------------------------------------------- Pain Assessment Details Patient Name: Date of Service: [name], [name] 10/04/2021 11:15 A M Medical Record Number: [id number] Patient Account Number: [id number] Date of Birth/Sex: Treating RN: Dec 16, 1960 (61 y.o. [name] Primary Care Blaize Nipper: A [facility] [place] Other Clinician: Referring Tanara Turvey: Treating Bernardine Langworthy/Extender: Britt Bolognese [facility] [place] Weeks in Treatment: 3 Active Problems Location of Pain Severity and Description of Pain Patient Has Paino No Site Locations Rate the pain. Current Pain Level: 0 Pain Management and Medication Current Pain Management: Medication: No Cold Application: No Rest: No Massage: No Activity: No T.E.N.S.: No Heat Application: No Leg drop or elevation: No Is the Current Pain Management Adequate: Adequate How does your wound impact your activities of daily livingo Sleep: No Bathing: No Appetite: No Relationship With Others: No Bladder Continence: No Emotions: No Bowel Continence: No Work: No Toileting: No Drive: No Dressing: No Hobbies: No Electronic Signature(s) Signed: 10/04/2021 5:50:10 PM By: Deon Pilling RN, BSN Signed: 10/05/2021 4:43:35 PM By: Erenest Blank Entered By: Erenest Blank on 10/04/2021 11:37:57 -------------------------------------------------------------------------------- Patient/Caregiver Education Details Patient Name: Date of Service: [name], [name]  9/25/2023andnbsp11:15 A M Medical Record Number: [id number] Patient Account Number: [id number] Date of Birth/Gender: Treating RN: 03-23-1960 (61 y.o. [name] Primary Care Physician: A [facility] [place] Other Clinician: Referring Physician: Treating Physician/Extender: Britt Bolognese [facility] [place] Weeks in Treatment: 3 Education Assessment Education Provided To: Patient Education Topics Provided Wound/Skin Impairment: Handouts: Skin Care Do's and Dont's Methods: Explain/Verbal Responses: Reinforcements needed Electronic Signature(s) Signed: 10/04/2021 5:50:10 PM By: Deon Pilling RN, BSN Entered By: Deon Pilling on 10/04/2021 11:51:51 -------------------------------------------------------------------------------- Wound Assessment Details Patient Name: Date of Service: [name], [name] 10/04/2021 11:15 A M Medical Record Number: [id number] Patient Account Number: [id number] Date of Birth/Sex: Treating RN: 06/11/1960 (61 y.o. [name], [name] Primary Care Annalena Piatt: A [facility] [place] Other Clinician: Referring Chelcee Korpi: Treating Breyonna Nault/Extender: Britt Bolognese [facility] [place] Weeks in Treatment: 3 Wound Status Wound Number: 1 Primary Lymphedema Etiology: Wound Location: Right, Circumferential Lower Leg Wound Open Wounding Event: Gradually Appeared Status: Date Acquired: 06/04/2021 Comorbid Anemia, Lymphedema, Pneumothorax, Hypertension, Weeks Of Treatment: 3 History: Osteoarthritis, Seizure Disorder Clustered Wound: Yes Photos Wound Measurements Length: (cm) 8 Width: (cm) 5 Depth: (cm) 0.1 Clustered Quantity: 1 Area: (cm) 31.416 Volume: (cm) 3.142 % Reduction in Area: 95.5% % Reduction in Volume: 95.5% Epithelialization: Small (1-33%) Tunneling: No Undermining: No Wound Description Classification: Full Thickness Without Exposed Support Structures Wound Margin: Distinct, outline  attached Exudate  Amount: Medium Exudate Type: Serosanguineous Exudate Color: red, brown Foul Odor After Cleansing: No Slough/Fibrino No Wound Bed Granulation Amount: Large (67-100%) Exposed Structure Granulation Quality: Red, Pink Fascia Exposed: No Fat Layer (Subcutaneous Tissue) Exposed: Yes Tendon Exposed: No Muscle Exposed: No Joint Exposed: No Bone Exposed: No Treatment Notes Wound #1 (Lower Leg) Wound Laterality: Right, Circumferential Cleanser Soap and Water Discharge Instruction: May shower and wash wound with dial antibacterial soap and water prior to dressing change. Wound Cleanser Discharge Instruction: Cleanse the wound with wound cleanser prior to applying a clean dressing using gauze sponges, not tissue or cotton balls. Peri-Wound Care Triamcinolone 15 (g) Discharge Instruction: Use triamcinolone 15 (g) as directed Zinc Oxide Ointment 30g tube Discharge Instruction: Apply Zinc Oxide to periwound with each dressing change Sween Lotion (Moisturizing lotion) Discharge Instruction: Apply moisturizing lotion as directed Topical Primary Dressing KerraCel Ag Gelling Fiber Dressing, 4x5 in (silver alginate) Discharge Instruction: Apply silver alginate to wound bed as instructed Secondary Dressing ABD Pad, 8x10 Discharge Instruction: Apply over primary dressing as directed. CarboFLEX Odor Control Dressing, 4x4 in Discharge Instruction: Apply over primary dressing as directed. Zetuvit Plus 4x8 in Discharge Instruction: Apply over primary dressing as directed. Secured With Compression Wrap ThreePress (3 layer compression wrap) Discharge Instruction: Apply three layer compression as directed. Compression Stockings Circaid Juxta Lite Compression Wrap Quantity: 1 Right Leg Compression Amount: 30-40 mmHg Discharge Instruction: Apply Circaid Juxta Lite Compression Wrap daily as instructed. Apply first thing in the morning, remove at night before bed. Add-Ons Electronic  Signature(s) Signed: 10/04/2021 5:50:10 PM By: Deon Pilling RN, BSN Signed: 10/05/2021 4:43:35 PM By: Erenest Blank Entered By: Erenest Blank on 10/04/2021 11:36:42 -------------------------------------------------------------------------------- Wound Assessment Details Patient Name: Date of Service: [name], [name] 10/04/2021 11:15 A M Medical Record Number: [id number] Patient Account Number: [id number] Date of Birth/Sex: Treating RN: 09-29-1960 (61 y.o. [name], [name] Primary Care Birda Didonato: A [facility] [place] Other Clinician: Referring Tiea Manninen: Treating Zair Borawski/Extender: Britt Bolognese [facility] [place] Weeks in Treatment: 3 Wound Status Wound Number: 2 Primary Lymphedema Etiology: Wound Location: Left, Circumferential Lower Leg Wound Open Wounding Event: Gradually Appeared Status: Date Acquired: 06/04/2021 Comorbid Anemia, Lymphedema, Pneumothorax, Hypertension, Weeks Of Treatment: 3 History: Osteoarthritis, Seizure Disorder Clustered Wound: Yes Photos Wound Measurements Length: (cm) 12.5 Width: (cm) 8.5 Depth: (cm) 0.1 Clustered Quantity: 3 Area: (cm) 83.449 Volume: (cm) 8.345 % Reduction in Area: 39.5% % Reduction in Volume: 69.7% Epithelialization: Small (1-33%) Tunneling: No Undermining: No Wound Description Classification: Full Thickness Without Exposed Support Structures Wound Margin: Distinct, outline attached Exudate Amount: Medium Exudate Type: Serosanguineous Exudate Color: red, brown Foul Odor After Cleansing: No Slough/Fibrino Yes Wound Bed Granulation Amount: Small (1-33%) Exposed Structure Granulation Quality: Red, Pink Fascia Exposed: No Necrotic Amount: Large (67-100%) Fat Layer (Subcutaneous Tissue) Exposed: Yes Necrotic Quality: Adherent Slough Tendon Exposed: No Muscle Exposed: No Joint Exposed: No Bone Exposed: No Treatment Notes Wound #2 (Lower Leg) Wound Laterality: Left,  Circumferential Cleanser Soap and Water Discharge Instruction: May shower and wash wound with dial antibacterial soap and water prior to dressing change. Wound Cleanser Discharge Instruction: Cleanse the wound with wound cleanser prior to applying a clean dressing using gauze sponges, not tissue or cotton balls. Peri-Wound Care Triamcinolone 15 (g) Discharge Instruction: Use triamcinolone 15 (g) as directed Zinc Oxide Ointment 30g tube Discharge Instruction: Apply Zinc Oxide to periwound with each dressing change Sween Lotion (Moisturizing lotion) Discharge Instruction: Apply moisturizing lotion as directed  Topical Primary Dressing KerraCel Ag Gelling Fiber Dressing, 4x5 in (silver alginate) Discharge Instruction: Apply silver alginate to wound bed as instructed Secondary Dressing ABD Pad, 8x10 Discharge Instruction: Apply over primary dressing as directed. CarboFLEX Odor Control Dressing, 4x4 in Discharge Instruction: Apply over primary dressing as directed. Zetuvit Plus 4x8 in Discharge Instruction: Apply over primary dressing as directed. Secured With Compression Wrap ThreePress (3 layer compression wrap) Discharge Instruction: Apply three layer compression as directed. Compression Stockings Circaid Juxta Lite Compression Wrap Quantity: 1 Left Leg Compression Amount: 30-40 mmHg Discharge Instruction: Apply Circaid Juxta Lite Compression Wrap daily as instructed. Apply first thing in the morning, remove at night before bed. Add-Ons Electronic Signature(s) Signed: 10/04/2021 5:50:10 PM By: Deon Pilling RN, BSN Signed: 10/05/2021 4:43:35 PM By: Erenest Blank Entered By: Erenest Blank on 10/04/2021 11:37:21 -------------------------------------------------------------------------------- Vitals Details Patient Name: Date of Service: [name], [name] 10/04/2021 11:15 A M Medical Record Number: [id number] Patient Account Number: [id number] Date of Birth/Sex: Treating  RN: 12/14/1960 (61 y.o. Tonita Phoenix, Lauren Primary Care Markel Mergenthaler: A [facility] [place] Other Clinician: Referring Baden Betsch: Treating Shavaughn Seidl/Extender: Britt Bolognese [facility] [place] Weeks in Treatment: 3 Vital Signs Time Taken: 11:13 Temperature (F): 97.6 Height (in): 64 Pulse (bpm): 77 Weight (lbs): 135 Respiratory Rate (breaths/min): 18 Body Mass Index (BMI): 23.2 Blood Pressure (mmHg): 134/76 Reference Range: 80 - 120 mg / dl Electronic Signature(s) Signed: 10/05/2021 4:47:31 PM By: Rhae Hammock RN Entered By: Rhae Hammock on 10/04/2021 11:13:59

## 2021-10-06 ENCOUNTER — Encounter (HOSPITAL_BASED_OUTPATIENT_CLINIC_OR_DEPARTMENT_OTHER): Payer: BC Managed Care – PPO | Admitting: Physician Assistant

## 2021-10-06 DIAGNOSIS — I890 Lymphedema, not elsewhere classified: Secondary | ICD-10-CM | POA: Diagnosis not present

## 2021-10-06 DIAGNOSIS — J449 Chronic obstructive pulmonary disease, unspecified: Secondary | ICD-10-CM | POA: Diagnosis not present

## 2021-10-06 DIAGNOSIS — I87313 Chronic venous hypertension (idiopathic) with ulcer of bilateral lower extremity: Secondary | ICD-10-CM | POA: Diagnosis not present

## 2021-10-06 DIAGNOSIS — L97822 Non-pressure chronic ulcer of other part of left lower leg with fat layer exposed: Secondary | ICD-10-CM | POA: Diagnosis not present

## 2021-10-06 DIAGNOSIS — G35 Multiple sclerosis: Secondary | ICD-10-CM | POA: Diagnosis not present

## 2021-10-06 DIAGNOSIS — L97812 Non-pressure chronic ulcer of other part of right lower leg with fat layer exposed: Secondary | ICD-10-CM | POA: Diagnosis not present

## 2021-10-06 NOTE — Progress Notes (Signed)
JANNIFER, FISCHLER ([id number]) Visit Report for 10/06/2021 SuperBill Details Patient Name: Date of Service: [name], [name] 10/06/2021 Medical Record Number: [id number] Patient Account Number: [id number] Date of Birth/Sex: Treating RN: 19-Sep-1960 (61 y.o. [name], [name] Primary Care Provider: A [facility] [place] Other Clinician: Referring Provider: Treating Provider/Extender: Worthy Keeler A [facility] [place] Weeks in Treatment: 4 Diagnosis Coding ICD-10 Codes Code Description [id number] Non-pressure chronic ulcer of other part of right lower leg with fat layer exposed L97.822 Non-pressure chronic ulcer of other part of left lower leg with fat layer exposed I89.0 Lymphedema, not elsewhere classified I87.313 Chronic venous hypertension (idiopathic) with ulcer of bilateral lower extremity J44.9 Chronic obstructive pulmonary disease, unspecified G35 Multiple sclerosis Facility Procedures CPT4 Description Modifier Quantity Code [id number] BILATERAL: Application of multi-layer venous compression system; leg (below knee), including ankle and 1 foot. Electronic Signature(s) Signed: 10/06/2021 4:44:37 PM By: Erenest Blank Signed: 10/06/2021 5:27:13 PM By: Worthy Keeler PA-C Entered By: Erenest Blank on 10/06/2021 16:44:03

## 2021-10-06 NOTE — Progress Notes (Signed)
CARNESHIA, RAKER ([id number]) Visit Report for 10/06/2021 Arrival Information Details Patient Name: Date of Service: RO [place], [name] 10/06/2021 3:45 PM Medical Record Number: [id number] Patient Account Number: [id number] Date of Birth/Sex: Treating RN: Aug 29, 1960 (61 y.o. [name] Primary Care Hadiyah Maricle: A [facility], [place] Other Clinician: Referring Sussan Meter: Treating Katianne Barre/Extender: Lenda Kelp A [facility] [place] Weeks in Treatment: 4 Visit Information History Since Last Visit Added or deleted any medications: No Patient Arrived: Wheel Chair Any new allergies or adverse reactions: No Arrival Time: 16:40 Had a fall or experienced change in No Accompanied By: husband activities of daily living that may affect Transfer Assistance: Manual risk of falls: Patient Identification Verified: Yes Signs or symptoms of abuse/neglect since last visito No Secondary Verification Process Completed: Yes Hospitalized since last visit: No Patient Requires Transmission-Based Precautions: No Has Compression in Place as Prescribed: Yes Patient Has Alerts: Yes Pain Present Now: No Patient Alerts: 5/23 ABI: L1.01 R 1.10 5/23 TBI: L0.75 R 0.90 Electronic Signature(s) Signed: 10/06/2021 4:44:37 PM By: Thayer Dallas Entered By: Thayer Dallas on 10/06/2021 16:41:08 -------------------------------------------------------------------------------- Compression Therapy Details Patient Name: Date of Service: [name], [name] 10/06/2021 3:45 PM Medical Record Number: [id number] Patient Account Number: [id number] Date of Birth/Sex: Treating RN: 04/19/1960 (61 y.o. [name] Primary Care Freddy Spadafora: A [facility] [place] Other Clinician: Referring Harry Bark: Treating Tayveon Lombardo/Extender: Lenda Kelp A [facility] [place] Weeks in Treatment: 4 Compression Therapy Performed for Wound Assessment: Wound #1 Right,Circumferential Lower Leg Performed By:  Clinician Thayer Dallas, Compression Type: Three Layer Electronic Signature(s) Signed: 10/06/2021 4:44:37 PM By: Thayer Dallas Entered By: Thayer Dallas on 10/06/2021 16:42:16 -------------------------------------------------------------------------------- Compression Therapy Details Patient Name: Date of Service: [name], [name] 10/06/2021 3:45 PM Medical Record Number: [id number] Patient Account Number: [id number] Date of Birth/Sex: Treating RN: 1960/04/18 (61 y.o. [name] Primary Care Jonavin Seder: A [facility] [place] Other Clinician: Referring Kadeem Hyle: Treating Kordelia Severin/Extender: Harriette Ohara [facility] [place] Weeks in Treatment: 4 Compression Therapy Performed for Wound Assessment: Wound #2 Left,Circumferential Lower Leg Performed By: Clinician Thayer Dallas, Compression Type: Three Layer Electronic Signature(s) Signed: 10/06/2021 4:44:37 PM By: Thayer Dallas Entered By: Thayer Dallas on 10/06/2021 16:42:16 -------------------------------------------------------------------------------- Encounter Discharge Information Details Patient Name: Date of Service: [name], [name] 10/06/2021 3:45 PM Medical Record Number: [id number] Patient Account Number: [id number] Date of Birth/Sex: Treating RN: Nov 09, 1960 (61 y.o. [name] Primary Care Taysen Bushart: A [facility] [place] Other Clinician: Referring Verleen Stuckey: Treating Cammie Faulstich/Extender: Harriette Ohara [facility] [place] Weeks in Treatment: 4 Encounter Discharge Information Items Discharge Condition: Stable Ambulatory Status: Stretcher Discharge Destination: Home Transportation: Private Auto Accompanied By: husband Schedule Follow-up Appointment: Yes Clinical Summary of Care: Electronic Signature(s) Signed: 10/06/2021 4:44:37 PM By: Thayer Dallas Entered By: Thayer Dallas on 10/06/2021  16:42:59 -------------------------------------------------------------------------------- Patient/Caregiver Education Details Patient Name: Date of Service: [name], [name] 9/27/2023andnbsp3:45 PM Medical Record Number: [id number] Patient Account Number: [id number] Date of Birth/Gender: Treating RN: [birth year] (61 y.o. [name] Primary Care Physician: A [facility] [place] Other Clinician: Referring Physician: Treating Physician/Extender: Harriette Ohara [facility] [place] Weeks in Treatment: 4 Education Assessment Education Provided To: Patient Education Topics Provided Electronic Signature(s) Signed: 10/06/2021 4:44:37 PM By: Thayer Dallas Entered By: Thayer Dallas on 10/06/2021 16:42:35 -------------------------------------------------------------------------------- Wound Assessment Details Patient  Name: Date of Service: [name], [name] 10/06/2021 3:45 PM Medical Record Number: [id number] Patient Account Number: [id number] Date of Birth/Sex: Treating RN: May 06, 1960 (61 y.o. [name], [name] Primary Care Simrin Vegh: A [facility] [place] Other Clinician: Referring Julian Medina: Treating Rorey Hodges/Extender: Lenda Kelp A [facility] [place] Weeks in Treatment: 4 Wound Status Wound Number: 1 Primary Etiology: Lymphedema Wound Location: Right, Circumferential Lower Leg Wound Status: Open Wounding Event: Gradually Appeared Date Acquired: 06/04/2021 Weeks Of Treatment: 4 Clustered Wound: Yes Wound Measurements Length: (cm) 8 Width: (cm) 5 Depth: (cm) 0.1 Area: (cm) 31.416 Volume: (cm) 3.142 % Reduction in Area: 95.5% % Reduction in Volume: 95.5% Wound Description Classification: Full Thickness Without Exposed Support Structur Exudate Amount: Medium Exudate Type: Serosanguineous Exudate Color: red, brown es Treatment Notes Wound #1 (Lower Leg) Wound Laterality: Right, Circumferential Cleanser Soap and Water Discharge  Instruction: May shower and wash wound with dial antibacterial soap and water prior to dressing change. Wound Cleanser Discharge Instruction: Cleanse the wound with wound cleanser prior to applying a clean dressing using gauze sponges, not tissue or cotton balls. Peri-Wound Care Triamcinolone 15 (g) Discharge Instruction: Use triamcinolone 15 (g) as directed Zinc Oxide Ointment 30g tube Discharge Instruction: Apply Zinc Oxide to periwound with each dressing change Sween Lotion (Moisturizing lotion) Discharge Instruction: Apply moisturizing lotion as directed Topical Primary Dressing KerraCel Ag Gelling Fiber Dressing, 4x5 in (silver alginate) Discharge Instruction: Apply silver alginate to wound bed as instructed Secondary Dressing ABD Pad, 8x10 Discharge Instruction: Apply over primary dressing as directed. CarboFLEX Odor Control Dressing, 4x4 in Discharge Instruction: Apply over primary dressing as directed. Zetuvit Plus 4x8 in Discharge Instruction: Apply over primary dressing as directed. Secured With Compression Wrap ThreePress (3 layer compression wrap) Discharge Instruction: Apply three layer compression as directed. Compression Stockings Circaid Juxta Lite Compression Wrap Quantity: 1 Right Leg Compression Amount: 30-40 mmHg Discharge Instruction: Apply Circaid Juxta Lite Compression Wrap daily as instructed. Apply first thing in the morning, remove at night before bed. Add-Ons Electronic Signature(s) Signed: 10/06/2021 4:44:37 PM By: Thayer Dallas Signed: 10/06/2021 5:54:22 PM By: [name] RN, BSN Entered By: Thayer Dallas on 10/06/2021 16:41:48 -------------------------------------------------------------------------------- Wound Assessment Details Patient Name: Date of Service: [name], [name] 10/06/2021 3:45 PM Medical Record Number: [id number] Patient Account Number: [id number] Date of Birth/Sex: Treating RN: October 16, 1960 (61 y.o. [name],  [name] Primary Care Keandra Medero: A [facility] [place] Other Clinician: Referring Cherisa Brucker: Treating Bastian Andreoli/Extender: Lenda Kelp A [facility] [place] Weeks in Treatment: 4 Wound Status Wound Number: 2 Primary Etiology: Lymphedema Wound Location: Left, Circumferential Lower Leg Wound Status: Open Wounding Event: Gradually Appeared Date Acquired: 06/04/2021 Weeks Of Treatment: 4 Clustered Wound: Yes Wound Measurements Length: (cm) 12.5 Width: (cm) 8.5 Depth: (cm) 0.1 Area: (cm) 83.449 Volume: (cm) 8.345 % Reduction in Area: 39.5% % Reduction in Volume: 69.7% Wound Description Classification: Full Thickness Without Exposed Support Structu Exudate Amount: Medium Exudate Type: Serosanguineous Exudate Color: red, brown res Treatment Notes Wound #2 (Lower Leg) Wound Laterality: Left, Circumferential Cleanser Soap and Water Discharge Instruction: May shower and wash wound with dial antibacterial soap and water prior to dressing change. Wound Cleanser Discharge Instruction: Cleanse the wound with wound cleanser prior to applying a clean dressing using gauze sponges, not tissue or cotton balls. Peri-Wound Care Triamcinolone 15 (g) Discharge Instruction: Use triamcinolone 15 (g) as directed Zinc Oxide Ointment 30g tube Discharge Instruction: Apply Zinc Oxide to periwound with  each dressing change Sween Lotion (Moisturizing lotion) Discharge Instruction: Apply moisturizing lotion as directed Topical Primary Dressing KerraCel Ag Gelling Fiber Dressing, 4x5 in (silver alginate) Discharge Instruction: Apply silver alginate to wound bed as instructed Secondary Dressing ABD Pad, 8x10 Discharge Instruction: Apply over primary dressing as directed. CarboFLEX Odor Control Dressing, 4x4 in Discharge Instruction: Apply over primary dressing as directed. Zetuvit Plus 4x8 in Discharge Instruction: Apply over primary dressing as directed. Secured With Compression  Wrap ThreePress (3 layer compression wrap) Discharge Instruction: Apply three layer compression as directed. Compression Stockings Circaid Juxta Lite Compression Wrap Quantity: 1 Left Leg Compression Amount: 30-40 mmHg Discharge Instruction: Apply Circaid Juxta Lite Compression Wrap daily as instructed. Apply first thing in the morning, remove at night before bed. Add-Ons Electronic Signature(s) Signed: 10/06/2021 4:44:37 PM By: Erenest Blank Signed: 10/06/2021 5:54:22 PM By: Deon Pilling RN, BSN Entered By: Erenest Blank on 10/06/2021 16:41:48 -------------------------------------------------------------------------------- Vitals Details Patient Name: Date of Service: [name], [name] 10/06/2021 3:45 PM Medical Record Number: [id number] Patient Account Number: [id number] Date of Birth/Sex: Treating RN: 12-07-1960 (61 y.o. [name], [name] Primary Care Luke Falero: A [facility] [place] Other Clinician: Referring Chananya Canizalez: Treating Oluwakemi Salsberry/Extender: Worthy Keeler A [facility] [place] Weeks in Treatment: 4 Vital Signs Time Taken: 15:55 Temperature (F): 98.1 Height (in): 64 Pulse (bpm): 89 Weight (lbs): 135 Respiratory Rate (breaths/min): 18 Body Mass Index (BMI): 23.2 Blood Pressure (mmHg): 125/66 Reference Range: 80 - 120 mg / dl Electronic Signature(s) Signed: 10/06/2021 4:44:37 PM By: Erenest Blank Entered By: Erenest Blank on 10/06/2021 16:41:29

## 2021-10-11 ENCOUNTER — Encounter (HOSPITAL_BASED_OUTPATIENT_CLINIC_OR_DEPARTMENT_OTHER): Payer: BC Managed Care – PPO | Attending: Internal Medicine | Admitting: Internal Medicine

## 2021-10-11 DIAGNOSIS — I890 Lymphedema, not elsewhere classified: Secondary | ICD-10-CM | POA: Diagnosis not present

## 2021-10-11 DIAGNOSIS — L97812 Non-pressure chronic ulcer of other part of right lower leg with fat layer exposed: Secondary | ICD-10-CM | POA: Insufficient documentation

## 2021-10-11 DIAGNOSIS — L97822 Non-pressure chronic ulcer of other part of left lower leg with fat layer exposed: Secondary | ICD-10-CM | POA: Insufficient documentation

## 2021-10-11 DIAGNOSIS — G35 Multiple sclerosis: Secondary | ICD-10-CM | POA: Insufficient documentation

## 2021-10-11 DIAGNOSIS — I87313 Chronic venous hypertension (idiopathic) with ulcer of bilateral lower extremity: Secondary | ICD-10-CM | POA: Diagnosis not present

## 2021-10-11 DIAGNOSIS — J449 Chronic obstructive pulmonary disease, unspecified: Secondary | ICD-10-CM | POA: Diagnosis not present

## 2021-10-11 NOTE — Progress Notes (Signed)
[name], [name] ([id number]) Visit Report for 10/11/2021 Chief Complaint Document Details Patient Name: Date of Service: [name], [name] 10/11/2021 11:00 A M Medical Record Number: [id number] Patient Account Number: [id number] Date of Birth/Sex: Treating RN: August 31, 1960 (61 y.o. [name] Primary Care Provider: A [facility], [place] Other Clinician: Referring Provider: Treating Provider/Extender: Britt Bolognese [facility], [place] Weeks in Treatment: 4 Information Obtained from: Patient Chief Complaint 09/07/2021; bilateral lower extremity wounds Electronic Signature(s) Signed: 10/11/2021 1:44:59 PM By: Kalman Shan DO Entered By: Kalman Shan on 10/11/2021 11:56:22 -------------------------------------------------------------------------------- HPI Details Patient Name: Date of Service: [name], [name] 10/11/2021 11:00 A M Medical Record Number: [id number] Patient Account Number: [id number] Date of Birth/Sex: Treating RN: 1960-12-29 (61 y.o. [name] Primary Care Provider: A [facility], [place] Other Clinician: Referring Provider: Treating Provider/Extender: Britt Bolognese [facility], [place] Weeks in Treatment: 4 History of Present Illness HPI Description: Admission 09/07/2021 [name] is a 61 year old female with a past medical history of COPD, multiple sclerosis and lymphedema that presents the clinic for a 47-monthhistory of nonhealing ulcers to her lower extremities bilaterally. She was recently seen in the ED on 8/11 for left lower cellulitis and given IV antibiotics and discharged on oral Augmentin. She completed this on 8/19. Patient does not wear compression stockings. She does not have a history of wounds previously. She is not on a diuretic. 9/7; patient presents for follow-up. She states that she needed to take the wraps off yesterday due to drainage coming through. We have been using silver alginate to the  right lower extremity wound and gentamicin and mupirocin ointment with calcium alginate to the left lower extremity wound all under Kerlix/Coban. She currently denies signs of infection. 9/11; patient presents for follow-up. She was able to keep the wraps in place. We have been using silver alginate to the right lower extremity and silver alginate and Iodoflex to the left lower extremity all under 3 layer compression. She notes that the Iodoflex caused her irritation. She currently denies signs of infection. 9/18; patient presents for follow-up. We have been using silver alginate to the right lower extremity and antibiotic ointment with calcium alginate to the left lower extremity all under 3 layer compression. She states that the wraps felt tight and had to take them off last night. Other than that she has no issues or complaints today. 9/25; patient presents for follow-up. We have been using silver alginate and with antibiotic ointment to the lower extremities bilaterally with compression. Patient has tolerated this well. She has no issues or complaints today. 10/2; patient presents for follow-up. We have been using silver alginate and antibiotic ointment under compression therapy. She states that the wrap was uncomfortable and took it off last night. She has significant swelling on exam. She is not on a diuretic. Electronic Signature(s) Signed: 10/11/2021 1:44:59 PM By: HKalman ShanDO Entered By: HKalman Shanon 10/11/2021 11:59:04 -------------------------------------------------------------------------------- Physical Exam Details Patient Name: Date of Service: [name], [name] 10/11/2021 11:00 A M Medical Record Number: [id number] Account Number: [id number] of Birth/Sex: Treating RN: 101/31/1962(61 y.o. FDebby BudPrimary Care Provider: [facility], [place] Other Clinician: Referring Provider: Treating Provider/Extender: HBritt Bolognese[facility],  [place] Weeks in Treatment: 4 Constitutional respirations regular, non-labored and within target range for patient.. Cardiovascular 2+ dorsalis pedis/posterior tibialis pulses. Psychiatric pleasant  and cooperative. Notes Right lower extremity: 3+ pitting edema to the thigh. papilomatosis cutis lymphostatica. Open wound with weeping to the anterior aspect. No increased warmth or purulent drainage noted. Left lower extremity: Scattered open wounds with granulation tissue. serous drainage. 3+ pitting edema to the thigh. No increased warmth or purulent drainage noted. Venous stasis dermatitis. Electronic Signature(s) Signed: 10/11/2021 1:44:59 PM By: Kalman Shan DO Entered By: Kalman Shan on 10/11/2021 12:00:26 -------------------------------------------------------------------------------- Physician Orders Details Patient Name: Date of Service: [name], [name] 10/11/2021 11:00 A M Medical Record Number: [id number] Patient Account Number: [id number] Date of Birth/Sex: Treating RN: June 11, 1960 (61 y.o. [name], [name] Primary Care Provider: [facility], [place] Other Clinician: Referring Provider: Treating Provider/Extender: Britt Bolognese [facility], [place] Weeks in Treatment: 4 Verbal / Phone Orders: No Diagnosis Coding ICD-10 Coding Code Description [id number] Non-pressure chronic ulcer of other part of right lower leg with fat layer exposed L97.822 Non-pressure chronic ulcer of other part of left lower leg with fat layer exposed I89.0 Lymphedema, not elsewhere classified I87.313 Chronic venous hypertension (idiopathic) with ulcer of bilateral lower extremity J44.9 Chronic obstructive pulmonary disease, unspecified G35 Multiple sclerosis Follow-up Appointments ppointment in 1 week. - Dr. Heber [place] and [place], Room 8 Monday Return A ppointment in 2 weeks. - Dr. Heber [place] and [place], Room 8 Monday Return A Nurse Visit: - 0930 Thursday overflow room  6. Other: - Call primary care provider related to needing a diuretics fluid pill due to edema/fluid in bilateral legs. Anesthetic (In clinic) Topical Lidocaine 5% applied to wound bed Bathing/ Shower/ Hygiene May shower with protection but do not get wound dressing(s) wet. Edema Control - Lymphedema / SCD / Other Lymphedema Pumps. Use Lymphedema pumps on leg(s) 2-3 times a day for 45-60 minutes. If wearing any wraps or hose, do not remove them. Continue exercising as instructed. - wound center to place an order for lymphedema pumps- [employer]. Elevate legs to the level of the heart or above for 30 minutes daily and/or when sitting, a frequency of: - 3-4 times a day throughout the day. Avoid standing for long periods of time. Exercise regularly Compression stocking or Garment 30-40 mm/Hg pressure to: - bring in weekly juxtalites HD for both legs. Wound Treatment Wound #1 - Lower Leg Wound Laterality: Right, Circumferential Cleanser: Soap and Water 1 x Per Week/30 Days Discharge Instructions: May shower and wash wound with dial antibacterial soap and water prior to dressing change. Cleanser: Wound Cleanser 1 x Per Week/30 Days Discharge Instructions: Cleanse the wound with wound cleanser prior to applying a clean dressing using gauze sponges, not tissue or cotton balls. Peri-Wound Care: Zinc Oxide Ointment 30g tube 1 x Per Week/30 Days Discharge Instructions: Apply Zinc Oxide to periwound with each dressing change Peri-Wound Care: Sween Lotion (Moisturizing lotion) 1 x Per Week/30 Days Discharge Instructions: Apply moisturizing lotion as directed Prim Dressing: KerraCel Ag Gelling Fiber Dressing, 4x5 in (silver alginate) 1 x Per Week/30 Days ary Discharge Instructions: Apply silver alginate to wound bed as instructed Secondary Dressing: ABD Pad, 8x10 1 x Per Week/30 Days Discharge Instructions: Apply over primary dressing as directed. Secondary Dressing: Zetuvit Plus 4x8 in 1 x Per  Week/30 Days Discharge Instructions: Apply over primary dressing as directed. Compression Wrap: ThreePress (3 layer compression wrap) 1 x Per Week/30 Days Discharge Instructions: Apply three layer compression as directed. Wound #2 - Lower Leg Wound Laterality: Left, Circumferential Cleanser: Soap and Water 1 x Per Week/30 Days  Discharge Instructions: May shower and wash wound with dial antibacterial soap and water prior to dressing change. Cleanser: Wound Cleanser 1 x Per Week/30 Days Discharge Instructions: Cleanse the wound with wound cleanser prior to applying a clean dressing using gauze sponges, not tissue or cotton balls. Peri-Wound Care: Zinc Oxide Ointment 30g tube 1 x Per Week/30 Days Discharge Instructions: Apply Zinc Oxide to periwound with each dressing change Peri-Wound Care: Sween Lotion (Moisturizing lotion) 1 x Per Week/30 Days Discharge Instructions: Apply moisturizing lotion as directed Prim Dressing: KerraCel Ag Gelling Fiber Dressing, 4x5 in (silver alginate) 1 x Per Week/30 Days ary Discharge Instructions: Apply silver alginate to wound bed as instructed Secondary Dressing: ABD Pad, 8x10 1 x Per Week/30 Days Discharge Instructions: Apply over primary dressing as directed. Secondary Dressing: Zetuvit Plus 4x8 in 1 x Per Week/30 Days Discharge Instructions: Apply over primary dressing as directed. Compression Wrap: ThreePress (3 layer compression wrap) 1 x Per Week/30 Days Discharge Instructions: Apply three layer compression as directed. Electronic Signature(s) Signed: 10/11/2021 1:44:59 PM By: Kalman Shan DO Entered By: Kalman Shan on 10/11/2021 12:00:34 -------------------------------------------------------------------------------- Problem List Details Patient Name: Date of Service: [name], [name] 10/11/2021 11:00 A M Medical Record Number: [id number] Patient Account Number: [id number] Date of Birth/Sex: Treating RN: 07-Apr-1960 (61 y.o. [name],  [name] Primary Care Provider: [facility], [place] Other Clinician: Referring Provider: Treating Provider/Extender: Britt Bolognese [facility], [place] Weeks in Treatment: 4 Active Problems ICD-10 Encounter Code Description Active Date MDM Diagnosis L97.812 Non-pressure chronic ulcer of other part of right lower leg with fat layer 09/07/2021 No Yes exposed L97.822 Non-pressure chronic ulcer of other part of left lower leg with fat layer exposed8/29/2023 No Yes I89.0 Lymphedema, not elsewhere classified 09/07/2021 No Yes I87.313 Chronic venous hypertension (idiopathic) with ulcer of bilateral lower extremity 09/07/2021 No Yes J44.9 Chronic obstructive pulmonary disease, unspecified 09/07/2021 No Yes G35 Multiple sclerosis 09/07/2021 No Yes Inactive Problems Resolved Problems Electronic Signature(s) Signed: 10/11/2021 1:44:59 PM By: Kalman Shan DO Entered By: Kalman Shan on 10/11/2021 11:56:13 -------------------------------------------------------------------------------- Progress Note Details Patient Name: Date of Service: [name], [name] 10/11/2021 11:00 A M Medical Record Number: [id number] Patient Account Number: [id number] Date of Birth/Sex: Treating RN: 1960/06/18 (61 y.o. [name] Primary Care Provider: [facility], [place] Other Clinician: Referring Provider: Treating Provider/Extender: Britt Bolognese [facility], [place] Weeks in Treatment: 4 Subjective Chief Complaint Information obtained from Patient 09/07/2021; bilateral lower extremity wounds History of Present Illness (HPI) Admission 09/07/2021 [name] is a 61 year old female with a past medical history of COPD, multiple sclerosis and lymphedema that presents the clinic for a 21-monthhistory of nonhealing ulcers to her lower extremities bilaterally. She was recently seen in the ED on 8/11 for left lower cellulitis and given IV antibiotics and discharged on oral  Augmentin. She completed this on 8/19. Patient does not wear compression stockings. She does not have a history of wounds previously. She is not on a diuretic. 9/7; patient presents for follow-up. She states that she needed to take the wraps off yesterday due to drainage coming through. We have been using silver alginate to the right lower extremity wound and gentamicin and mupirocin ointment with calcium alginate to the left lower extremity wound all under Kerlix/Coban. She currently denies signs of infection. 9/11; patient presents for follow-up. She was able to keep the wraps in place. We have been using silver  alginate to the right lower extremity and silver alginate and Iodoflex to the left lower extremity all under 3 layer compression. She notes that the Iodoflex caused her irritation. She currently denies signs of infection. 9/18; patient presents for follow-up. We have been using silver alginate to the right lower extremity and antibiotic ointment with calcium alginate to the left lower extremity all under 3 layer compression. She states that the wraps felt tight and had to take them off last night. Other than that she has no issues or complaints today. 9/25; patient presents for follow-up. We have been using silver alginate and with antibiotic ointment to the lower extremities bilaterally with compression. Patient has tolerated this well. She has no issues or complaints today. 10/2; patient presents for follow-up. We have been using silver alginate and antibiotic ointment under compression therapy. She states that the wrap was uncomfortable and took it off last night. She has significant swelling on exam. She is not on a diuretic. Patient History Information obtained from Patient, Chart. Family History Cancer - Maternal Grandparents, Diabetes - Maternal Grandparents, Heart Disease - Father. Social History Current every day smoker - 1ppd, Marital Status - Married, Alcohol Use - Never, Drug  Use - No History, Caffeine Use - Never. Medical History Hematologic/Lymphatic Patient has history of Anemia, Lymphedema Respiratory Patient has history of Pneumothorax - bilateral Cardiovascular Patient has history of Hypertension Musculoskeletal Patient has history of Osteoarthritis Neurologic Patient has history of Seizure Disorder Hospitalization/Surgery History - 06/2013 ORIF left toe. - bladder surgery 2007. - appendectomy. Medical A Surgical History Notes nd Constitutional Symptoms (General Health) HLA B27 positive Respiratory pulmonary nodules Gastrointestinal GERD IBS Genitourinary UTI Musculoskeletal MS Objective Constitutional respirations regular, non-labored and within target range for patient.. Vitals Time Taken: 11:00 AM, Height: 64 in, Weight: 135 lbs, BMI: 23.2, Temperature: 98.7 F, Pulse: 80 bpm, Respiratory Rate: 18 breaths/min, Blood Pressure: 130/79 mmHg. Cardiovascular 2+ dorsalis pedis/posterior tibialis pulses. Psychiatric pleasant and cooperative. General Notes: Right lower extremity: 3+ pitting edema to the thigh. papilomatosis cutis lymphostatica. Open wound with weeping to the anterior aspect. No increased warmth or purulent drainage noted. Left lower extremity: Scattered open wounds with granulation tissue. serous drainage. 3+ pitting edema to the thigh. No increased warmth or purulent drainage noted. Venous stasis dermatitis. Integumentary (Hair, Skin) Wound #1 status is Open. Original cause of wound was Gradually Appeared. The date acquired was: 06/04/2021. The wound has been in treatment 4 weeks. The wound is located on the Right,Circumferential Lower Leg. The wound measures 10cm length x 20cm width x 0.1cm depth; 157.08cm^2 area and 15.708cm^3 volume. There is no tunneling or undermining noted. There is a medium amount of serosanguineous drainage noted. There is no granulation within the wound bed. There is a small (1-33%) amount of necrotic  tissue within the wound bed. Wound #2 status is Open. Original cause of wound was Gradually Appeared. The date acquired was: 06/04/2021. The wound has been in treatment 4 weeks. The wound is located on the Left,Circumferential Lower Leg. The wound measures 17cm length x 20cm width x 0.1cm depth; 267.035cm^2 area and 26.704cm^3 volume. There is no tunneling or undermining noted. There is a medium amount of serosanguineous drainage noted. There is large (67-100%) pink granulation within the wound bed. There is no necrotic tissue within the wound bed. Assessment Active Problems ICD-10 Non-pressure chronic ulcer of other part of right lower leg with fat layer exposed Non-pressure chronic ulcer of other part of left lower leg with fat layer exposed Lymphedema,  not elsewhere classified Chronic venous hypertension (idiopathic) with ulcer of bilateral lower extremity Chronic obstructive pulmonary disease, unspecified Multiple sclerosis Patient's wounds appear well-healing. Unfortunately she cannot tolerate the wrap as it felt a bit uncomfortable. She took it off last night. She has a blister to the posterior aspect that developed today. She has significant swelling on exam and she will need to start a diuretic. I recommended she discuss this with her primary care physician. We will continue with silver alginate and stop the antibiotic ointment. Continue compression therapy. Follow-up later in the week for nurse visit for wrap change. Procedures Wound #1 Pre-procedure diagnosis of Wound #1 is a Lymphedema located on the Right,Circumferential Lower Leg . There was a Three Layer Compression Therapy Procedure by Deon Pilling, RN. Post procedure Diagnosis Wound #1: Same as Pre-Procedure Wound #2 Pre-procedure diagnosis of Wound #2 is a Lymphedema located on the Left,Circumferential Lower Leg . There was a Three Layer Compression Therapy Procedure by Deon Pilling, RN. Post procedure Diagnosis Wound #2:  Same as Pre-Procedure Plan Follow-up Appointments: Return Appointment in 1 week. - Dr. Heber [place] and [place], Room 8 Monday Return Appointment in 2 weeks. - Dr. Heber  and [place], Room 8 Monday Nurse Visit: - 5277 Thursday overflow room 6. Other: - Call primary care provider related to needing a diuretics fluid pill due to edema/fluid in bilateral legs. Anesthetic: (In clinic) Topical Lidocaine 5% applied to wound bed Bathing/ Shower/ Hygiene: May shower with protection but do not get wound dressing(s) wet. Edema Control - Lymphedema / SCD / Other: Lymphedema Pumps. Use Lymphedema pumps on leg(s) 2-3 times a day for 45-60 minutes. If wearing any wraps or hose, do not remove them. Continue exercising as instructed. - wound center to place an order for lymphedema pumps- [employer]. Elevate legs to the level of the heart or above for 30 minutes daily and/or when sitting, a frequency of: - 3-4 times a day throughout the day. Avoid standing for long periods of time. Exercise regularly Compression stocking or Garment 30-40 mm/Hg pressure to: - bring in weekly juxtalites HD for both legs. WOUND #1: - Lower Leg Wound Laterality: Right, Circumferential Cleanser: Soap and Water 1 x Per Week/30 Days Discharge Instructions: May shower and wash wound with dial antibacterial soap and water prior to dressing change. Cleanser: Wound Cleanser 1 x Per Week/30 Days Discharge Instructions: Cleanse the wound with wound cleanser prior to applying a clean dressing using gauze sponges, not tissue or cotton balls. Peri-Wound Care: Zinc Oxide Ointment 30g tube 1 x Per Week/30 Days Discharge Instructions: Apply Zinc Oxide to periwound with each dressing change Peri-Wound Care: Sween Lotion (Moisturizing lotion) 1 x Per Week/30 Days Discharge Instructions: Apply moisturizing lotion as directed Prim Dressing: KerraCel Ag Gelling Fiber Dressing, 4x5 in (silver alginate) 1 x Per Week/30 Days ary Discharge  Instructions: Apply silver alginate to wound bed as instructed Secondary Dressing: ABD Pad, 8x10 1 x Per Week/30 Days Discharge Instructions: Apply over primary dressing as directed. Secondary Dressing: Zetuvit Plus 4x8 in 1 x Per Week/30 Days Discharge Instructions: Apply over primary dressing as directed. Com pression Wrap: ThreePress (3 layer compression wrap) 1 x Per Week/30 Days Discharge Instructions: Apply three layer compression as directed. WOUND #2: - Lower Leg Wound Laterality: Left, Circumferential Cleanser: Soap and Water 1 x Per Week/30 Days Discharge Instructions: May shower and wash wound with dial antibacterial soap and water prior to dressing change. Cleanser: Wound Cleanser 1 x Per Week/30 Days Discharge Instructions: Cleanse the wound  with wound cleanser prior to applying a clean dressing using gauze sponges, not tissue or cotton balls. Peri-Wound Care: Zinc Oxide Ointment 30g tube 1 x Per Week/30 Days Discharge Instructions: Apply Zinc Oxide to periwound with each dressing change Peri-Wound Care: Sween Lotion (Moisturizing lotion) 1 x Per Week/30 Days Discharge Instructions: Apply moisturizing lotion as directed Prim Dressing: KerraCel Ag Gelling Fiber Dressing, 4x5 in (silver alginate) 1 x Per Week/30 Days ary Discharge Instructions: Apply silver alginate to wound bed as instructed Secondary Dressing: ABD Pad, 8x10 1 x Per Week/30 Days Discharge Instructions: Apply over primary dressing as directed. Secondary Dressing: Zetuvit Plus 4x8 in 1 x Per Week/30 Days Discharge Instructions: Apply over primary dressing as directed. Com pression Wrap: ThreePress (3 layer compression wrap) 1 x Per Week/30 Days Discharge Instructions: Apply three layer compression as directed. 1. Silver alginate under 3 layer compression 2. Follow-up with primary care physician about diuretic regimen 3. Follow-up later in the week for nurse visit 4. Follow-up in 1 week Electronic  Signature(s) Signed: 10/11/2021 1:44:59 PM By: Kalman Shan DO Entered By: Kalman Shan on 10/11/2021 12:02:05 -------------------------------------------------------------------------------- HxROS Details Patient Name: Date of Service: [name], [name] 10/11/2021 11:00 A M Medical Record Number: [id number] Patient Account Number: [id number] Date of Birth/Sex: Treating RN: 02/07/1960 (61 y.o. [name] Primary Care Provider: [facility], [place] Other Clinician: Referring Provider: Treating Provider/Extender: Britt Bolognese [facility], [place] Weeks in Treatment: 4 Information Obtained From Patient Chart Constitutional Symptoms (General Health) Medical History: Past Medical History Notes: HLA B27 positive Hematologic/Lymphatic Medical History: Positive for: Anemia; Lymphedema Respiratory Medical History: Positive for: Pneumothorax - bilateral Past Medical History Notes: pulmonary nodules Cardiovascular Medical History: Positive for: Hypertension Gastrointestinal Medical History: Past Medical History Notes: GERD IBS Genitourinary Medical History: Past Medical History Notes: UTI Musculoskeletal Medical History: Positive for: Osteoarthritis Past Medical History Notes: MS Neurologic Medical History: Positive for: Seizure Disorder Immunizations Pneumococcal Vaccine: Received Pneumococcal Vaccination: No Implantable Devices No devices added Hospitalization / Surgery History Type of Hospitalization/Surgery 06/2013 ORIF left toe bladder surgery 2007 appendectomy Family and Social History Cancer: Yes - Maternal Grandparents; Diabetes: Yes - Maternal Grandparents; Heart Disease: Yes - Father; Current every day smoker - 1ppd; Marital Status - Married; Alcohol Use: Never; Drug Use: No History; Caffeine Use: Never; Financial Concerns: No; Food, Clothing or Shelter Needs: No; Support System Lacking: No; Transportation Concerns:  No Electronic Signature(s) Signed: 10/11/2021 1:44:59 PM By: Kalman Shan DO Signed: 10/11/2021 4:42:08 PM By: Deon Pilling RN, BSN Entered By: Kalman Shan on 10/11/2021 11:59:09 -------------------------------------------------------------------------------- SuperBill Details Patient Name: Date of Service: [name], [name] 10/11/2021 Medical Record Number: [id number] Patient Account Number: [id number] Date of Birth/Sex: Treating RN: [birth year] (61 y.o. [name], [name] Primary Care Provider: [facility], [place] Other Clinician: Referring Provider: Treating Provider/Extender: Britt Bolognese [facility], [place] Weeks in Treatment: 4 Diagnosis Coding ICD-10 Codes Code Description [phone] Non-pressure chronic ulcer of other part of right lower leg with fat layer exposed L97.822 Non-pressure chronic ulcer of other part of left lower leg with fat layer exposed I89.0 Lymphedema, not elsewhere classified I87.313 Chronic venous hypertension (idiopathic) with ulcer of bilateral lower extremity J44.9 Chronic obstructive pulmonary disease, unspecified G35 Multiple sclerosis Facility Procedures Physician Procedures : CPT4 Code Description Modifier [id number] - WC PHYS LEVEL 3 - EST PT ICD-10 Diagnosis Description 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

## 2021-10-11 NOTE — Progress Notes (Signed)
[name], [name] ([id number]) Visit Report for 10/11/2021 Arrival Information Details Patient Name: Date of Service: RO [place], [name] 10/11/2021 11:00 A M Medical Record Number: [id number] Patient Account Number: [id number] Date of Birth/Sex: Treating RN: 1960/08/28 (61 y.o. [name], [name] Primary Care Felisha Claytor: A [facility] [place] Other Clinician: Referring Tika Hannis: Treating Cap Massi/Extender: Sharin Mons [facility] [place] Weeks in Treatment: 4 Visit Information History Since Last Visit Added or deleted any medications: No Patient Arrived: Ambulatory Any new allergies or adverse reactions: No Arrival Time: 10:52 Had a fall or experienced change in No Accompanied By: husband activities of daily living that may affect Transfer Assistance: Manual risk of falls: Patient Identification Verified: Yes Signs or symptoms of abuse/neglect since last visito No Secondary Verification Process Completed: Yes Hospitalized since last visit: No Patient Requires Transmission-Based Precautions: No Implantable device outside of the clinic excluding No Patient Has Alerts: Yes cellular tissue based products placed in the center Patient Alerts: 5/23 ABI: L1.01 R 1.10 since last visit: 5/23 TBI: L0.75 R 0.90 Has Compression in Place as Prescribed: No Pain Present Now: No Electronic Signature(s) Signed: 10/11/2021 4:49:57 PM By: Thayer Dallas Entered By: Thayer Dallas on 10/11/2021 11:00:12 -------------------------------------------------------------------------------- Compression Therapy Details Patient Name: Date of Service: [name], [name] 10/11/2021 11:00 A M Medical Record Number: [id number] Patient Account Number: [id number] Date of Birth/Sex: Treating RN: May 16, 1960 (61 y.o. [name] Primary Care Melanie Pellot: A [facility] [place] Other Clinician: Referring Hailey Miles: Treating Pearlee Arvizu/Extender: Sharin Mons [facility] [place] Weeks in  Treatment: 4 Compression Therapy Performed for Wound Assessment: Wound #1 Right,Circumferential Lower Leg Performed By: Clinician [name], RN Compression Type: Three Layer Post Procedure Diagnosis Same as Pre-procedure Electronic Signature(s) Signed: 10/11/2021 4:42:08 PM By: [name] RN, BSN Entered By: [name] on 10/11/2021 11:35:58 -------------------------------------------------------------------------------- Compression Therapy Details Patient Name: Date of Service: [name], [name] 10/11/2021 11:00 A M Medical Record Number: [id number] Patient Account Number: [id number] Date of Birth/Sex: Treating RN: [birth year] (61 y.o. [name] Primary Care Cheyeanne Roadcap: A [facility] [place] Other Clinician: Referring Kaileia Flow: Treating Mohan Erven/Extender: Sharin Mons [facility] [place] Weeks in Treatment: 4 Compression Therapy Performed for Wound Assessment: Wound #2 Left,Circumferential Lower Leg Performed By: Clinician [name], RN Compression Type: Three Layer Post Procedure Diagnosis Same as Pre-procedure Electronic Signature(s) Signed: 10/11/2021 4:42:08 PM By: [name] RN, BSN Entered By: [name] on 10/11/2021 11:35:58 -------------------------------------------------------------------------------- Encounter Discharge Information Details Patient Name: Date of Service: [name], [name] 10/11/2021 11:00 A M Medical Record Number: [id number] Patient Account Number: [id number] Date of Birth/Sex: Treating RN: 02-Jun-1960 (61 y.o. [name] Primary Care Eppie Barhorst: A [facility] [place] Other Clinician: Referring Shaleigh Laubscher: Treating Deni Berti/Extender: Sharin Mons [facility] [place] Weeks in Treatment: 4 Encounter Discharge Information Items Discharge Condition: Stable Ambulatory Status: Wheelchair Discharge Destination: Home Transportation: Private Auto Accompanied By: husband Schedule Follow-up  Appointment: Yes Clinical Summary of Care: Electronic Signature(s) Signed: 10/11/2021 4:42:08 PM By: [name] RN, BSN Entered By: [name] on 10/11/2021 11:36:27 -------------------------------------------------------------------------------- Lower Extremity Assessment Details Patient Name: Date of Service: [name], [name] 10/11/2021 11:00 A M Medical Record Number: [id number] Patient Account Number: [id number] Date of Birth/Sex: Treating RN: August 03, 1960 (61 y.o. [name] Primary Care Kyliana Standen: A [facility] [place] Other Clinician: Referring Tamarius Rosenfield: Treating Jalayna Josten/Extender: Geralyn Corwin  [facility] [place] Weeks in Treatment: 4 Edema Assessment Assessed: [Left: No] [Right: No] Edema: [Left: Yes] [Right: Yes] Calf Left: Right: Point of Measurement: 27 cm From Medial Instep 39 cm 42 cm Ankle Left: Right: Point of Measurement: 9 cm From Medial Instep 25 cm 28 cm Vascular Assessment Pulses: Dorsalis Pedis Palpable: [Left:Yes] [Right:Yes] Electronic Signature(s) Signed: 10/11/2021 4:42:08 PM By: [name] RN, BSN Signed: 10/11/2021 4:49:57 PM By: Thayer Dallas Entered By: Thayer Dallas on 10/11/2021 11:12:34 -------------------------------------------------------------------------------- Multi Wound Chart Details Patient Name: Date of Service: [name], [name] 10/11/2021 11:00 A M Medical Record Number: [id number] Patient Account Number: [id number] Date of Birth/Sex: Treating RN: [birth year] (61 y.o. [name], [name] Primary Care Princetta Uplinger: A [facility] [place] Other Clinician: Referring Dmarion Perfect: Treating Rosali Augello/Extender: Geralyn Corwin [facility] [place] Weeks in Treatment: 4 Vital Signs Height(in): 64 Pulse(bpm): 80 Weight(lbs): 135 Blood Pressure(mmHg): 130/79 Body Mass Index(BMI): 23.2 Temperature(F): 98.7 Respiratory Rate(breaths/min): 18 Photos: [N/A:N/A] Right, Circumferential Lower Leg  Left, Circumferential Lower Leg N/A Wound Location: Gradually Appeared Gradually Appeared N/A Wounding Event: Lymphedema Lymphedema N/A Primary Etiology: Anemia, Lymphedema, Pneumothorax, Anemia, Lymphedema, Pneumothorax, N/A Comorbid History: Hypertension, Osteoarthritis, Seizure Hypertension, Osteoarthritis, Seizure Disorder Disorder 06/04/2021 06/04/2021 N/A Date Acquired: 4 4 N/A Weeks of Treatment: Open Open N/A Wound Status: No No N/A Wound Recurrence: Yes Yes N/A Clustered Wound: 10x20x0.1 17x20x0.1 N/A Measurements L x W x D (cm) 157.08 267.035 N/A A (cm) : rea 15.708 26.704 N/A Volume (cm) : 77.30% -93.70% N/A % Reduction in Area: 77.30% 3.10% N/A % Reduction in Volume: Full Thickness Without Exposed Full Thickness Without Exposed N/A Classification: Support Structures Support Structures Medium Medium N/A Exudate A mount: Serosanguineous Serosanguineous N/A Exudate Type: red, brown red, brown N/A Exudate Color: None Present (0%) Large (67-100%) N/A Granulation A mount: N/A Pink N/A Granulation Quality: Small (1-33%) None Present (0%) N/A Necrotic A mount: None N/A N/A Epithelialization: Compression Therapy Compression Therapy N/A Procedures Performed: Treatment Notes Wound #1 (Lower Leg) Wound Laterality: Right, Circumferential Cleanser Soap and Water Discharge Instruction: May shower and wash wound with dial antibacterial soap and water prior to dressing change. Wound Cleanser Discharge Instruction: Cleanse the wound with wound cleanser prior to applying a clean dressing using gauze sponges, not tissue or cotton balls. Peri-Wound Care Zinc Oxide Ointment 30g tube Discharge Instruction: Apply Zinc Oxide to periwound with each dressing change Sween Lotion (Moisturizing lotion) Discharge Instruction: Apply moisturizing lotion as directed Topical Primary Dressing KerraCel Ag Gelling Fiber Dressing, 4x5 in (silver alginate) Discharge Instruction:  Apply silver alginate to wound bed as instructed Secondary Dressing ABD Pad, 8x10 Discharge Instruction: Apply over primary dressing as directed. Zetuvit Plus 4x8 in Discharge Instruction: Apply over primary dressing as directed. Secured With Compression Wrap ThreePress (3 layer compression wrap) Discharge Instruction: Apply three layer compression as directed. Compression Stockings Add-Ons Wound #2 (Lower Leg) Wound Laterality: Left, Circumferential Cleanser Soap and Water Discharge Instruction: May shower and wash wound with dial antibacterial soap and water prior to dressing change. Wound Cleanser Discharge Instruction: Cleanse the wound with wound cleanser prior to applying a clean dressing using gauze sponges, not tissue or cotton balls. Peri-Wound Care Zinc Oxide Ointment 30g tube Discharge Instruction: Apply Zinc Oxide to periwound with each dressing change Sween Lotion (Moisturizing lotion) Discharge Instruction: Apply moisturizing lotion as directed Topical Primary Dressing KerraCel Ag Gelling Fiber Dressing, 4x5 in (silver alginate) Discharge Instruction: Apply silver alginate to wound bed as instructed Secondary  Dressing ABD Pad, 8x10 Discharge Instruction: Apply over primary dressing as directed. Zetuvit Plus 4x8 in Discharge Instruction: Apply over primary dressing as directed. Secured With Compression Wrap ThreePress (3 layer compression wrap) Discharge Instruction: Apply three layer compression as directed. Compression Stockings Add-Ons Electronic Signature(s) Signed: 10/11/2021 1:44:59 PM By: Geralyn Corwin DO Signed: 10/11/2021 4:42:08 PM By: [name] RN, BSN Entered By: Geralyn Corwin on 10/11/2021 11:56:17 -------------------------------------------------------------------------------- Multi-Disciplinary Care Plan Details Patient Name: Date of Service: [name], [name] 10/11/2021 11:00 A M Medical Record Number: [id number] Patient Account  Number: [id number] Date of Birth/Sex: Treating RN: 25-Jan-1960 (61 y.o. [name] Primary Care Lavere Shinsky: A [facility] [place] Other Clinician: Referring Shatyra Becka: Treating Kevyn Wengert/Extender: Sharin Mons [facility] [place] Weeks in Treatment: 4 Active Inactive Venous Leg Ulcer Nursing Diagnoses: Knowledge deficit related to disease process and management Goals: Patient will maintain optimal edema control Date Initiated: 09/07/2021 Target Resolution Date: 11/05/2021 Goal Status: Active Interventions: Assess peripheral edema status every visit. Compression as ordered Treatment Activities: Therapeutic compression applied : 09/07/2021 Notes: 09/20/21: Edema control ongoing, using 3 layer wrap. Wound/Skin Impairment Nursing Diagnoses: Knowledge deficit related to ulceration/compromised skin integrity Goals: Patient/caregiver will verbalize understanding of skin care regimen Date Initiated: 09/07/2021 Target Resolution Date: 11/05/2021 Goal Status: Active Interventions: Assess patient/caregiver ability to perform ulcer/skin care regimen upon admission and as needed Assess ulceration(s) every visit Provide education on ulcer and skin care Treatment Activities: Skin care regimen initiated : 09/07/2021 Topical wound management initiated : 09/07/2021 Notes: 09/20/21: Wound care regimen continues. Electronic Signature(s) Signed: 10/11/2021 4:42:08 PM By: [name] RN, BSN Entered By: [name] on 10/11/2021 11:24:17 -------------------------------------------------------------------------------- Pain Assessment Details Patient Name: Date of Service: [name], [name] 10/11/2021 11:00 A M Medical Record Number: [id number] Patient Account Number: [id number] Date of Birth/Sex: Treating RN: Sep 07, 1960 (61 y.o. [name] Primary Care Shaeleigh Graw: A [facility] [place] Other Clinician: Referring Joene Gelder: Treating Klyn Kroening/Extender: Sharin Mons [facility] [place] Weeks in Treatment: 4 Active Problems Location of Pain Severity and Description of Pain Patient Has Paino Yes Site Locations Pain Location: Pain in Ulcers Rate the pain. Current Pain Level: 7 Pain Management and Medication Current Pain Management: Electronic Signature(s) Signed: 10/11/2021 4:42:08 PM By: [name] RN, BSN Signed: 10/11/2021 4:49:57 PM By: Thayer Dallas Entered By: Thayer Dallas on 10/11/2021 11:01:15 -------------------------------------------------------------------------------- Patient/Caregiver Education Details Patient Name: Date of Service: [name], [name] 10/2/2023andnbsp11:00 A M Medical Record Number: [id number] Patient Account Number: [id number] Date of Birth/Gender: Treating RN: 1960-04-23 (61 y.o. [name] Primary Care Physician: A [facility] [place] Other Clinician: Referring Physician: Treating Physician/Extender: Sharin Mons [facility] [place] Weeks in Treatment: 4 Education Assessment Education Provided To: Patient Education Topics Provided Wound/Skin Impairment: Handouts: Skin Care Do's and Dont's Methods: Explain/Verbal Responses: Reinforcements needed Electronic Signature(s) Signed: 10/11/2021 4:42:08 PM By: [name] RN, BSN Entered By: [name] on 10/11/2021 11:24:54 -------------------------------------------------------------------------------- Wound Assessment Details Patient Name: Date of Service: [name], [name] 10/11/2021 11:00 A M Medical Record Number: [id number] Patient Account Number: [id number] Date of Birth/Sex: Treating RN: 28-Jun-1960 (61 y.o. [name] Primary Care Shantell Belongia: A [facility] [place] Other Clinician: Referring Jakaree Pickard: Treating Emira Eubanks/Extender: Sharin Mons [facility] [place] Weeks in Treatment: 4 Wound Status Wound Number: 1 Primary Lymphedema Etiology: Wound Location: Right,  Circumferential Lower Leg Wound Open  Wounding Event: Gradually Appeared Status: Date Acquired: 06/04/2021 Comorbid Anemia, Lymphedema, Pneumothorax, Hypertension, Weeks Of Treatment: 4 History: Osteoarthritis, Seizure Disorder Clustered Wound: Yes Photos Wound Measurements Length: (cm) 10 Width: (cm) 20 Depth: (cm) 0.1 Area: (cm) 157.08 Volume: (cm) 15.708 % Reduction in Area: 77.3% % Reduction in Volume: 77.3% Epithelialization: None Tunneling: No Undermining: No Wound Description Classification: Full Thickness Without Exposed Support Stru Exudate Amount: Medium Exudate Type: Serosanguineous Exudate Color: red, brown ctures Wound Bed Granulation Amount: None Present (0%) Necrotic Amount: Small (1-33%) Treatment Notes Wound #1 (Lower Leg) Wound Laterality: Right, Circumferential Cleanser Soap and Water Discharge Instruction: May shower and wash wound with dial antibacterial soap and water prior to dressing change. Wound Cleanser Discharge Instruction: Cleanse the wound with wound cleanser prior to applying a clean dressing using gauze sponges, not tissue or cotton balls. Peri-Wound Care Zinc Oxide Ointment 30g tube Discharge Instruction: Apply Zinc Oxide to periwound with each dressing change Sween Lotion (Moisturizing lotion) Discharge Instruction: Apply moisturizing lotion as directed Topical Primary Dressing KerraCel Ag Gelling Fiber Dressing, 4x5 in (silver alginate) Discharge Instruction: Apply silver alginate to wound bed as instructed Secondary Dressing ABD Pad, 8x10 Discharge Instruction: Apply over primary dressing as directed. Zetuvit Plus 4x8 in Discharge Instruction: Apply over primary dressing as directed. Secured With Compression Wrap ThreePress (3 layer compression wrap) Discharge Instruction: Apply three layer compression as directed. Compression Stockings Add-Ons Electronic Signature(s) Signed: 10/11/2021 4:42:08 PM By: [name] RN,  BSN Signed: 10/11/2021 4:49:57 PM By: Thayer Dallas Entered By: Thayer Dallas on 10/11/2021 11:14:25 -------------------------------------------------------------------------------- Wound Assessment Details Patient Name: Date of Service: [name], [name] 10/11/2021 11:00 A M Medical Record Number: [id number] Patient Account Number: [id number] Date of Birth/Sex: Treating RN: Apr 20, 1960 (61 y.o. [name], [name] Primary Care Brain Honeycutt: A [facility] [place] Other Clinician: Referring Khala Tarte: Treating Haik Mahoney/Extender: Sharin Mons [facility] [place] Weeks in Treatment: 4 Wound Status Wound Number: 2 Primary Lymphedema Etiology: Wound Location: Left, Circumferential Lower Leg Wound Open Wounding Event: Gradually Appeared Status: Date Acquired: 06/04/2021 Comorbid Anemia, Lymphedema, Pneumothorax, Hypertension, Weeks Of Treatment: 4 History: Osteoarthritis, Seizure Disorder Clustered Wound: Yes Photos Wound Measurements Length: (cm) 17 Width: (cm) 20 Depth: (cm) 0.1 Area: (cm) 267.035 Volume: (cm) 26.704 % Reduction in Area: -93.7% % Reduction in Volume: 3.1% Tunneling: No Undermining: No Wound Description Classification: Full Thickness Without Exposed Support Str Exudate Amount: Medium Exudate Type: Serosanguineous Exudate Color: red, brown uctures Wound Bed Granulation Amount: Large (67-100%) Granulation Quality: Pink Necrotic Amount: None Present (0%) Treatment Notes Wound #2 (Lower Leg) Wound Laterality: Left, Circumferential Cleanser Soap and Water Discharge Instruction: May shower and wash wound with dial antibacterial soap and water prior to dressing change. Wound Cleanser Discharge Instruction: Cleanse the wound with wound cleanser prior to applying a clean dressing using gauze sponges, not tissue or cotton balls. Peri-Wound Care Zinc Oxide Ointment 30g tube Discharge Instruction: Apply Zinc Oxide to periwound with each dressing  change Sween Lotion (Moisturizing lotion) Discharge Instruction: Apply moisturizing lotion as directed Topical Primary Dressing KerraCel Ag Gelling Fiber Dressing, 4x5 in (silver alginate) Discharge Instruction: Apply silver alginate to wound bed as instructed Secondary Dressing ABD Pad, 8x10 Discharge Instruction: Apply over primary dressing as directed. Zetuvit Plus 4x8 in Discharge Instruction: Apply over primary dressing as directed. Secured With Compression Wrap ThreePress (3 layer compression wrap) Discharge Instruction: Apply three layer compression as directed. Compression Stockings Add-Ons Electronic Signature(s) Signed: 10/11/2021 4:42:08 PM By: [name] RN, BSN  Signed: 10/11/2021 4:49:57 PM By: Erenest Blank Entered By: Erenest Blank on 10/11/2021 11:15:30 -------------------------------------------------------------------------------- Vitals Details Patient Name: Date of Service: [name], [name] 10/11/2021 11:00 A M Medical Record Number: [id number] Patient Account Number: [id number] Date of Birth/Sex: Treating RN: Jun 12, 1960 (61 y.o. [name], [name] Primary Care Hendricks Schwandt: A [facility] [place] Other Clinician: Referring Ayisha Pol: Treating Horris Speros/Extender: Britt Bolognese [facility] [place] Weeks in Treatment: 4 Vital Signs Time Taken: 11:00 Temperature (F): 98.7 Height (in): 64 Pulse (bpm): 80 Weight (lbs): 135 Respiratory Rate (breaths/min): 18 Body Mass Index (BMI): 23.2 Blood Pressure (mmHg): 130/79 Reference Range: 80 - 120 mg / dl Electronic Signature(s) Signed: 10/11/2021 4:49:57 PM By: Erenest Blank Entered By: Erenest Blank on 10/11/2021 11:00:32

## 2021-10-12 DIAGNOSIS — R269 Unspecified abnormalities of gait and mobility: Secondary | ICD-10-CM | POA: Diagnosis not present

## 2021-10-12 DIAGNOSIS — Z79899 Other long term (current) drug therapy: Secondary | ICD-10-CM | POA: Diagnosis not present

## 2021-10-12 DIAGNOSIS — R5383 Other fatigue: Secondary | ICD-10-CM | POA: Diagnosis not present

## 2021-10-12 DIAGNOSIS — G35 Multiple sclerosis: Secondary | ICD-10-CM | POA: Diagnosis not present

## 2021-10-12 DIAGNOSIS — M5459 Other low back pain: Secondary | ICD-10-CM | POA: Diagnosis not present

## 2021-10-13 DIAGNOSIS — I890 Lymphedema, not elsewhere classified: Secondary | ICD-10-CM | POA: Diagnosis not present

## 2021-10-14 ENCOUNTER — Encounter (HOSPITAL_BASED_OUTPATIENT_CLINIC_OR_DEPARTMENT_OTHER): Payer: BC Managed Care – PPO | Admitting: Internal Medicine

## 2021-10-14 DIAGNOSIS — L97812 Non-pressure chronic ulcer of other part of right lower leg with fat layer exposed: Secondary | ICD-10-CM | POA: Diagnosis not present

## 2021-10-14 DIAGNOSIS — J449 Chronic obstructive pulmonary disease, unspecified: Secondary | ICD-10-CM | POA: Diagnosis not present

## 2021-10-14 DIAGNOSIS — I890 Lymphedema, not elsewhere classified: Secondary | ICD-10-CM | POA: Diagnosis not present

## 2021-10-14 DIAGNOSIS — I87313 Chronic venous hypertension (idiopathic) with ulcer of bilateral lower extremity: Secondary | ICD-10-CM | POA: Diagnosis not present

## 2021-10-14 DIAGNOSIS — G35 Multiple sclerosis: Secondary | ICD-10-CM | POA: Diagnosis not present

## 2021-10-14 DIAGNOSIS — L97822 Non-pressure chronic ulcer of other part of left lower leg with fat layer exposed: Secondary | ICD-10-CM | POA: Diagnosis not present
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
## 2021-10-14 NOTE — Progress Notes (Signed)
[name], [name] ([id number]) Visit Report for 10/14/2021 SuperBill Details Patient Name: Date of Service: [place], [name] 10/14/2021 Medical Record Number: [id number] Patient Account Number: [id number] Date of Birth/Sex: Treating RN: 1960-03-07 (61 y.o. F) Primary Care Provider: A [facility], [place] Other Clinician: Referring Provider: Treating Provider/Extender: Britt Bolognese [facility], [place] Weeks in Treatment: 5 Diagnosis Coding ICD-10 Codes Code Description [phone] Non-pressure chronic ulcer of other part of right lower leg with fat layer exposed L97.822 Non-pressure chronic ulcer of other part of left lower leg with fat layer exposed I89.0 Lymphedema, not elsewhere classified I87.313 Chronic venous hypertension (idiopathic) with ulcer of bilateral lower extremity J44.9 Chronic obstructive pulmonary disease, unspecified G35 Multiple sclerosis Facility Procedures CPT4 Description Modifier Quantity Code [id number] BILATERAL: Application of multi-layer venous compression system; leg (below knee), including ankle and 1 foot. Electronic Signature(s) Signed: 10/14/2021 3:37:19 PM By: Erenest Blank Signed: 10/14/2021 4:20:55 PM By: Kalman Shan DO Entered By: Erenest Blank on 10/14/2021 11:58:59

## 2021-10-18 ENCOUNTER — Encounter (HOSPITAL_BASED_OUTPATIENT_CLINIC_OR_DEPARTMENT_OTHER): Payer: BC Managed Care – PPO | Admitting: Internal Medicine

## 2021-10-18 DIAGNOSIS — I87313 Chronic venous hypertension (idiopathic) with ulcer of bilateral lower extremity: Secondary | ICD-10-CM | POA: Diagnosis not present

## 2021-10-18 DIAGNOSIS — I890 Lymphedema, not elsewhere classified: Secondary | ICD-10-CM | POA: Diagnosis not present

## 2021-10-18 DIAGNOSIS — J449 Chronic obstructive pulmonary disease, unspecified: Secondary | ICD-10-CM | POA: Diagnosis not present

## 2021-10-18 DIAGNOSIS — L97822 Non-pressure chronic ulcer of other part of left lower leg with fat layer exposed: Secondary | ICD-10-CM

## 2021-10-18 DIAGNOSIS — G35 Multiple sclerosis: Secondary | ICD-10-CM | POA: Diagnosis not present

## 2021-10-18 DIAGNOSIS — L97812 Non-pressure chronic ulcer of other part of right lower leg with fat layer exposed: Secondary | ICD-10-CM

## 2021-10-18 NOTE — Progress Notes (Signed)
Nancy, [name] ([id number]) [id number] Page 1 of 8 Visit Report for 10/18/2021 Chief Complaint Document Details Patient Name: Date of Service: [name], [name] 10/18/2021 11:00 A M Medical Record Number: [id number] Patient Account Number: [id number] Date of Birth/Sex: Treating RN: [birth year] (61 y.o. [name] Primary Care Provider: A [facility], [place] Other Clinician: Referring Provider: Treating Provider/Extender: Britt Bolognese [facility], [place] Nancy Weeks in Treatment: 5 Information Obtained from: Patient Chief Complaint 09/07/2021; bilateral lower extremity wounds Electronic Signature(s) Signed: 10/18/2021 12:26:27 PM By: Kalman Shan DO Entered By: Kalman Shan on 10/18/2021 11:30:50 -------------------------------------------------------------------------------- HPI Details Patient Name: Date of Service: [name], [name] 10/18/2021 11:00 A M Medical Record Number: [id number] Patient Account Number: [id number] Date of Birth/Sex: Treating RN: April 30, 1960 (61 y.o. [name] Primary Care Provider: A [facility], [place] Nancy Other Clinician: Referring Provider: Treating Provider/Extender: Britt Bolognese [facility], [place] Nancy Weeks in Treatment: 5 History of Present Illness HPI Description: Admission 09/07/2021 [name] is a 61 year old female with a past medical history of COPD, multiple sclerosis and lymphedema that presents the clinic for a 44-monthhistory of nonhealing ulcers to her lower extremities bilaterally. She was recently seen in the ED on 8/11 for left lower cellulitis and given IV antibiotics and discharged on oral Augmentin. She completed this on 8/19. Patient does not wear compression stockings. She does not have a history of wounds previously. She is not on a diuretic. 9/7; patient presents for follow-up. She states that she needed to take the wraps off yesterday due to drainage coming  through. We have been using silver alginate to the right lower extremity wound and gentamicin and mupirocin ointment with calcium alginate to the left lower extremity wound all under Kerlix/Coban. She currently denies signs of infection. 9/11; patient presents for follow-up. She was able to keep the wraps in place. We have been using silver alginate to the right lower extremity and silver alginate and Iodoflex to the left lower extremity all under 3 layer compression. She notes that the Iodoflex caused her irritation. She currently denies signs of infection. 9/18; patient presents for follow-up. We have been using silver alginate to the right lower extremity and antibiotic ointment with calcium alginate to the left lower extremity all under 3 layer compression. She states that the wraps felt tight and had to take them off last night. Other than that she has [name] issues or complaints today. 9/25; patient presents for follow-up. We have been using silver alginate and with antibiotic ointment to the lower extremities bilaterally with compression. Patient has tolerated this well. She has [name] issues or complaints today. 10/2; patient presents for follow-up. We have been using silver alginate and antibiotic ointment under compression therapy. She states that the wrap was uncomfortable and took it off last night. She has significant swelling on exam. She is not on a diuretic. 10/9; patient presents for follow-up. We have been using silver alginate under 3 layer compression bilaterally to the lower extremities. She took the wraps off yesterday due to discomfort. She would like to try her juxta lite compression instead of in office wraps. She denies signs of infection. She states she was recently started on a very low-dose of a diuretic. Electronic Signature(s) Signed: 10/18/2021 12:26:27 PM By: HKalman ShanDO [place],Signed: 10/18/2021 12:26:27 PM By: HJon Billings([id number])  [id number] Page 2 of 8 Entered By: HKalman Shanon 10/18/2021 11:31:37 --------------------------------------------------------------------------------  Physical Exam Details Patient Name: Date of Service: [name], [name] 10/18/2021 11:00 A M Medical Record Number: [id number] Patient Account Number: [id number] Date of Birth/Sex: Treating RN: Dec 20, 1960 (61 y.o. [name] Primary Care Provider: A [facility], [place] Nancy Other Clinician: Referring Provider: Treating Provider/Extender: Britt Bolognese [facility], [place] Nancy Weeks in Treatment: 5 Constitutional respirations regular, non-labored and within target range for patient.Marland Kitchen Psychiatric pleasant and cooperative. Notes Right lower extremity: 3+ pitting edema to the thigh. papilomatosis cutis lymphostatica. Scattered Open wounds with weeping T the lower extremity. [name] o increased warmth or purulent drainage noted. Left lower extremity: Scattered open wounds with granulation tissue. serous drainage. 3+ pitting edema to the thigh. [name] increased warmth or purulent drainage noted. Venous stasis dermatitis. Electronic Signature(s) Signed: 10/18/2021 12:26:27 PM By: Kalman Shan DO Entered By: Kalman Shan on 10/18/2021 11:32:29 -------------------------------------------------------------------------------- Physician Orders Details Patient Name: Date of Service: [name], [name] 10/18/2021 11:00 A M Medical Record Number: [id number] Patient Account Number: [id number] Date of Birth/Sex: Treating RN: [birth year] (61 y.o. [name], [name] Primary Care Provider: A [facility], [place] Nancy Other Clinician: Referring Provider: Treating Provider/Extender: Britt Bolognese [facility], [place] Nancy Weeks in Treatment: 5 Verbal / Phone Orders: [name] Diagnosis Coding ICD-10 Coding Code Description [phone] Non-pressure chronic ulcer of other part of right lower leg with fat layer  exposed L97.822 Non-pressure chronic ulcer of other part of left lower leg with fat layer exposed I89.0 Lymphedema, not elsewhere classified I87.313 Chronic venous hypertension (idiopathic) with ulcer of bilateral lower extremity J44.9 Chronic obstructive pulmonary disease, unspecified G35 Multiple sclerosis Follow-up Appointments ppointment in 1 week. - Dr. Heber [place] and [place], Room 8 Monday Return A ppointment in 2 weeks. - Dr. Heber Elbert and Elida, Room 8 Monday Return A Other: - [facility] will send your wound care supplies. Bathing/ Shower/ Hygiene May shower and wash wound with soap and water. Edema Control - Lymphedema / SCD / Other Lymphedema Pumps. Use Lymphedema pumps on leg(s) 2-3 times a day for 45-60 minutes. If wearing any wraps or hose, do not remove ANJELINA, DUNG ([id number]) [id number] Page 3 of 8 them. Continue exercising as instructed. - start with 2 times a day throughout the day. Elevate legs to the level of the heart or above for 30 minutes daily and/or when sitting, a frequency of: - 3-4 times a day throughout the day. Avoid standing for long periods of time. Exercise regularly Compression stocking or Garment 30-40 mm/Hg pressure to: - Apply in the morning and remove at night. Wound Treatment Wound #1 - Lower Leg Wound Laterality: Right, Circumferential Cleanser: Soap and Water 1 x Per Day/30 Days Discharge Instructions: May shower and wash wound with dial antibacterial soap and water prior to dressing change. Secondary Dressing: ABD Pad, 8x10 (DME) (Generic) 1 x Per Day/30 Days Discharge Instructions: Apply over primary dressing as directed. Secured With: [employer], 4.5x3.1 (in/yd) (DME) (Generic) 1 x Per Day/30 Days Discharge Instructions: Secure with Kerlix as directed. Secured With: 88M Medipore H Soft Cloth Surgical T ape, 4 x 10 (in/yd) (DME) (Generic) 1 x Per Day/30 Days Discharge Instructions: Secure with tape as  directed. Wound #2 - Lower Leg Wound Laterality: Left, Circumferential Cleanser: Soap and Water 1 x Per Day/30 Days Discharge Instructions: May shower and wash wound with dial antibacterial soap and water prior to dressing change. Secondary Dressing: ABD Pad, 8x10 (DME) (Generic) 1 x Per Day/30 Days Discharge Instructions:  Apply over primary dressing as directed. Secured With: [employer], 4.5x3.1 (in/yd) (DME) (Generic) 1 x Per Day/30 Days Discharge Instructions: Secure with Kerlix as directed. Secured With: 53M Medipore H Soft Cloth Surgical T ape, 4 x 10 (in/yd) (DME) (Generic) 1 x Per Day/30 Days Discharge Instructions: Secure with tape as directed. Electronic Signature(s) Signed: 10/18/2021 12:26:27 PM By: Kalman Shan DO Entered By: Kalman Shan on 10/18/2021 11:32:36 -------------------------------------------------------------------------------- Problem List Details Patient Name: Date of Service: [name], [name] 10/18/2021 11:00 A M Medical Record Number: [id number] Patient Account Number: [id number] Date of Birth/Sex: Treating RN: [birth year] (61 y.o. [name] Primary Care Provider: A [facility], [place] Nancy Other Clinician: Referring Provider: Treating Provider/Extender: Britt Bolognese [facility], [place] Nancy Weeks in Treatment: 5 Active Problems ICD-10 Encounter Code Description Active Date MDM Diagnosis L97.812 Non-pressure chronic ulcer of other part of right lower leg with fat layer 09/07/2021 [name] Yes exposed L97.822 Non-pressure chronic ulcer of other part of left lower leg with fat layer exposed8/29/2023 [name] Yes I89.0 Lymphedema, not elsewhere classified 09/07/2021 [name] Yes I87.313 Chronic venous hypertension (idiopathic) with ulcer of bilateral lower extremity 09/07/2021 [name] Yes YUVONNE, LANAHAN ([id number]) [id number] Page 4 of 8 J44.9 Chronic obstructive pulmonary disease, unspecified 09/07/2021 [name] Yes G35  Multiple sclerosis 09/07/2021 [name] Yes Inactive Problems Resolved Problems Electronic Signature(s) Signed: 10/18/2021 12:26:27 PM By: Kalman Shan DO Entered By: Kalman Shan on 10/18/2021 11:30:25 -------------------------------------------------------------------------------- Progress Note Details Patient Name: Date of Service: [name], [name] 10/18/2021 11:00 A M Medical Record Number: [id number] Patient Account Number: [id number] Date of Birth/Sex: Treating RN: 08/11/1960 (61 y.o. [name] Primary Care Provider: A [facility], [place] Nancy Other Clinician: Referring Provider: Treating Provider/Extender: Britt Bolognese [facility], [place] Nancy Weeks in Treatment: 5 Subjective Chief Complaint Information obtained from Patient 09/07/2021; bilateral lower extremity wounds History of Present Illness (HPI) Admission 09/07/2021 Ms. [name] Lana is a 61 year old female with a past medical history of COPD, multiple sclerosis and lymphedema that presents the clinic for a 33-monthhistory of nonhealing ulcers to her lower extremities bilaterally. She was recently seen in the ED on 8/11 for left lower cellulitis and given IV antibiotics and discharged on oral Augmentin. She completed this on 8/19. Patient does not wear compression stockings. She does not have a history of wounds previously. She is not on a diuretic. 9/7; patient presents for follow-up. She states that she needed to take the wraps off yesterday due to drainage coming through. We have been using silver alginate to the right lower extremity wound and gentamicin and mupirocin ointment with calcium alginate to the left lower extremity wound all under Kerlix/Coban. She currently denies signs of infection. 9/11; patient presents for follow-up. She was able to keep the wraps in place. We have been using silver alginate to the right lower extremity and silver alginate and Iodoflex to the left lower extremity all under 3  layer compression. She notes that the Iodoflex caused her irritation. She currently denies signs of infection. 9/18; patient presents for follow-up. We have been using silver alginate to the right lower extremity and antibiotic ointment with calcium alginate to the left lower extremity all under 3 layer compression. She states that the wraps felt tight and had to take them off last night. Other than that she has [name] issues or complaints today. 9/25; patient presents for follow-up. We have been using silver alginate and with antibiotic ointment to  the lower extremities bilaterally with compression. Patient has tolerated this well. She has [name] issues or complaints today. 10/2; patient presents for follow-up. We have been using silver alginate and antibiotic ointment under compression therapy. She states that the wrap was uncomfortable and took it off last night. She has significant swelling on exam. She is not on a diuretic. 10/9; patient presents for follow-up. We have been using silver alginate under 3 layer compression bilaterally to the lower extremities. She took the wraps off yesterday due to discomfort. She would like to try her juxta lite compression instead of in office wraps. She denies signs of infection. She states she was recently started on a very low-dose of a diuretic. Patient History Information obtained from Patient, Chart. Family History Cancer - Maternal Grandparents, Diabetes - Maternal Grandparents, Heart Disease - Father. Social History Current every day smoker - 1ppd, Marital Status - Married, Alcohol Use - Never, Drug Use - [name] History, Caffeine Use - Never. Medical History CHARDONAY, SCRITCHFIELD ([id number]) [id number] Page 5 of 8 Hematologic/Lymphatic Patient has history of Anemia, Lymphedema Respiratory Patient has history of Pneumothorax - bilateral Cardiovascular Patient has history of Hypertension Musculoskeletal Patient has history of  Osteoarthritis Neurologic Patient has history of Seizure Disorder Hospitalization/Surgery History - 06/2013 ORIF left toe. - bladder surgery 2007. - appendectomy. Medical A Surgical History Notes nd Constitutional Symptoms (General Health) HLA B27 positive Respiratory pulmonary nodules Gastrointestinal GERD IBS Genitourinary UTI Musculoskeletal MS Objective Constitutional respirations regular, non-labored and within target range for patient.. Vitals Time Taken: 11:00 AM, Height: 64 in, Weight: 135 lbs, BMI: 23.2, Temperature: 97.8 F, Pulse: 61 bpm, Respiratory Rate: 20 breaths/min, Blood Pressure: 140/88 mmHg. Psychiatric pleasant and cooperative. General Notes: Right lower extremity: 3+ pitting edema to the thigh. papilomatosis cutis lymphostatica. Scattered Open wounds with weeping T the lower o extremity. [name] increased warmth or purulent drainage noted. Left lower extremity: Scattered open wounds with granulation tissue. serous drainage. 3+ pitting edema to the thigh. [name] increased warmth or purulent drainage noted. Venous stasis dermatitis. Integumentary (Hair, Skin) Wound #1 status is Open. Original cause of wound was Gradually Appeared. The date acquired was: 06/04/2021. The wound has been in treatment 5 weeks. The wound is located on the Right,Circumferential Lower Leg. The wound measures 15cm length x 43cm width x 0.1cm depth; 506.582cm^2 area and 50.658cm^3 volume. There is Fat Layer (Subcutaneous Tissue) exposed. There is [name] tunneling or undermining noted. There is a medium amount of serosanguineous drainage noted. The wound margin is distinct with the outline attached to the wound base. There is large (67-100%) pink, pale granulation within the wound bed. There is [name] necrotic tissue within the wound bed. The periwound skin appearance exhibited: Maceration. The periwound skin appearance did not exhibit: Callus, Crepitus, Excoriation, Induration, Rash, Scarring, Dry/Scaly,  Atrophie Blanche, Cyanosis, Ecchymosis, Hemosiderin Staining, Mottled, Pallor, Rubor, Erythema. Wound #2 status is Open. Original cause of wound was Gradually Appeared. The date acquired was: 06/04/2021. The wound has been in treatment 5 weeks. The wound is located on the Left,Circumferential Lower Leg. The wound measures 9cm length x 16cm width x 0.1cm depth; 113.097cm^2 area and 11.31cm^3 volume. There is Fat Layer (Subcutaneous Tissue) exposed. There is [name] tunneling or undermining noted. There is a medium amount of serosanguineous drainage noted. The wound margin is distinct with the outline attached to the wound base. There is large (67-100%) granulation within the wound bed. There is [name] necrotic tissue within the wound bed. The periwound skin appearance exhibited: Maceration. The  periwound skin appearance did not exhibit: Callus, Crepitus, Excoriation, Induration, Rash, Scarring, Dry/Scaly, Atrophie Blanche, Cyanosis, Ecchymosis, Hemosiderin Staining, Mottled, Pallor, Rubor, Erythema. Assessment Active Problems ICD-10 Non-pressure chronic ulcer of other part of right lower leg with fat layer exposed Non-pressure chronic ulcer of other part of left lower leg with fat layer exposed Lymphedema, not elsewhere classified Chronic venous hypertension (idiopathic) with ulcer of bilateral lower extremity Chronic obstructive pulmonary disease, unspecified Multiple sclerosis Patient's wounds appear well-healing. Unfortunately she is not tolerating the compression wraps well. At this time she would like to use her juxta light compression daily. I recommended she use ABD pads and Kerlix underneath the wraps to help control the drainage. She is receiving her lymphedema pumps CASS, EDINGER I ([id number]) [id number] Page 6 of 8 today. I recommended she use these daily at least 1 hour. She knows she can use these over the compression Velcro garments. [name] signs of infection  on exam. Follow-up in 1 week. Continue diuretic per primary. I am hoping this will help with decreasing her edema. It is unclear the dosage and what the medication is. Plan Follow-up Appointments: Return Appointment in 1 week. - Dr. Heber [place] and Tammi Klippel, Room 8 Monday Return Appointment in 2 weeks. - Dr. Heber Ina and Tammi Klippel, Room 8 Monday Other: [facility] will send your wound care supplies. Bathing/ Shower/ Hygiene: May shower and wash wound with soap and water. Edema Control - Lymphedema / SCD / Other: Lymphedema Pumps. Use Lymphedema pumps on leg(s) 2-3 times a day for 45-60 minutes. If wearing any wraps or hose, do not remove them. Continue exercising as instructed. - start with 2 times a day throughout the day. Elevate legs to the level of the heart or above for 30 minutes daily and/or when sitting, a frequency of: - 3-4 times a day throughout the day. Avoid standing for long periods of time. Exercise regularly Compression stocking or Garment 30-40 mm/Hg pressure to: - Apply in the morning and remove at night. WOUND #1: - Lower Leg Wound Laterality: Right, Circumferential Cleanser: Soap and Water 1 x Per Day/30 Days Discharge Instructions: May shower and wash wound with dial antibacterial soap and water prior to dressing change. Secondary Dressing: ABD Pad, 8x10 (DME) (Generic) 1 x Per Day/30 Days Discharge Instructions: Apply over primary dressing as directed. Secured With: [employer], 4.5x3.1 (in/yd) (DME) (Generic) 1 x Per Day/30 Days Discharge Instructions: Secure with Kerlix as directed. Secured With: 81M Medipore H Soft Cloth Surgical T ape, 4 x 10 (in/yd) (DME) (Generic) 1 x Per Day/30 Days Discharge Instructions: Secure with tape as directed. WOUND #2: - Lower Leg Wound Laterality: Left, Circumferential Cleanser: Soap and Water 1 x Per Day/30 Days Discharge Instructions: May shower and wash wound with dial antibacterial soap and water prior to dressing change. Secondary  Dressing: ABD Pad, 8x10 (DME) (Generic) 1 x Per Day/30 Days Discharge Instructions: Apply over primary dressing as directed. Secured With: [employer], 4.5x3.1 (in/yd) (DME) (Generic) 1 x Per Day/30 Days Discharge Instructions: Secure with Kerlix as directed. Secured With: 81M Medipore H Soft Cloth Surgical T ape, 4 x 10 (in/yd) (DME) (Generic) 1 x Per Day/30 Days Discharge Instructions: Secure with tape as directed. 1. ABD pad with Kerlix under compression garments daily 2. Use lymphedema pumps 3. Continue diuretic per primaryoopatient will call to let us know the medication name and dose 4. Follow-up in 1 weekooshe knows to call with any questions or concerns Electronic Signature(s) Signed: 10/18/2021 12:26:27 PM By: Kalman Shan  DO Entered By: Kalman Shan on 10/18/2021 11:34:42 -------------------------------------------------------------------------------- HxROS Details Patient Name: Date of Service: [name], [name] 10/18/2021 11:00 A M Medical Record Number: [id number] Patient Account Number: [id number] Date of Birth/Sex: Treating RN: 08-Feb-1960 (61 y.o. [name] Primary Care Provider: A [facility], [place] Nancy Other Clinician: Referring Provider: Treating Provider/Extender: Britt Bolognese [facility], [place] Nancy Weeks in Treatment: 5 Information Obtained From Patient Chart Constitutional Symptoms (General Health) Medical History: Past Medical History Notes: HLA B27 positive Hematologic/Lymphatic Medical History: Positive for: Anemia; Lymphedema DONATA, REDDICK ([id number]) [id number] Page 7 of 8 Respiratory Medical History: Positive for: Pneumothorax - bilateral Past Medical History Notes: pulmonary nodules Cardiovascular Medical History: Positive for: Hypertension Gastrointestinal Medical History: Past Medical History Notes: GERD IBS Genitourinary Medical History: Past Medical History  Notes: UTI Musculoskeletal Medical History: Positive for: Osteoarthritis Past Medical History Notes: MS Neurologic Medical History: Positive for: Seizure Disorder Immunizations Pneumococcal Vaccine: Received Pneumococcal Vaccination: [name] Implantable Devices [name] devices added Hospitalization / Surgery History Type of Hospitalization/Surgery 06/2013 ORIF left toe bladder surgery 2007 appendectomy Family and Social History Cancer: Yes - Maternal Grandparents; Diabetes: Yes - Maternal Grandparents; Heart Disease: Yes - Father; Current every day smoker - 1ppd; Marital Status - Married; Alcohol Use: Never; Drug Use: [name] History; Caffeine Use: Never; Financial Concerns: [name]; Food, Clothing or Shelter Needs: [name]; Support System Lacking: [name]; Transportation Concerns: [name] Electronic Signature(s) Signed: 10/18/2021 12:26:27 PM By: Kalman Shan DO Signed: 10/18/2021 4:57:59 PM By: Deon Pilling RN, BSN Entered By: Kalman Shan on 10/18/2021 11:31:41 -------------------------------------------------------------------------------- SuperBill Details Patient Name: Date of Service: [name], [name] 10/18/2021 Medical Record Number: [id number] Patient Account Number: [id number] Date of Birth/Sex: Treating RN: [birth year] (60 y.o. [name], [name] Primary Care Provider: A [facility], [place] Other Clinician: Referring Provider: Treating Provider/Extender: Britt Bolognese [facility], [place] Nancy Weeks in Treatment: 5 NENA, HAMPE M ([id number]) [id number] Page 8 of 8 Diagnosis Coding ICD-10 Codes Code Description [phone] Non-pressure chronic ulcer of other part of right lower leg with fat layer exposed L97.822 Non-pressure chronic ulcer of other part of left lower leg with fat layer exposed I89.0 Lymphedema, not elsewhere classified I87.313 Chronic venous hypertension (idiopathic) with ulcer of bilateral lower extremity J44.9 Chronic obstructive  pulmonary disease, unspecified G35 Multiple sclerosis Facility Procedures : CPT4 Code: [id number] Description: 99214 - WOUND CARE VISIT-LEV 4 EST PT Modifier: Quantity: 1 Physician Procedures : CPT4 Code Description Modifier [id number] - WC PHYS LEVEL 3 - EST PT ICD-10 Diagnosis Description W97.948 Non-pressure chronic ulcer of other part of right lower leg with fat layer exposed L97.822 Non-pressure chronic ulcer of other part of left  lower leg with fat layer exposed I89.0 Lymphedema, not elsewhere classified I87.313 Chronic venous hypertension (idiopathic) with ulcer of bilateral lower extremity Quantity: 1 Electronic Signature(s) Signed: 10/18/2021 12:26:27 PM By: Kalman Shan DO Entered By: Kalman Shan on 10/18/2021 11:34:54

## 2021-10-18 NOTE — Progress Notes (Signed)
LORELEE, [name] ([id number]) [id number] Page 1 of 11 Visit Report for 10/18/2021 Arrival Information Details Patient Name: Date of Service: Nancy [place] [name], [name]. 10/18/2021 11:00 A [name] Medical Record Number: [id number] Patient Account Number: [id number] Date of Birth/Sex: Treating RN: [birth year] (61 y.o. [name], [name] Primary Care Gildardo Tickner: A [facility], [place] Nancy Other Clinician: Referring Yaniv Lage: Treating Iretta Mangrum/Extender: Sharin Mons [facility] [place] Nancy Weeks in Treatment: 5 Visit Information History Since Last Visit Added or deleted any medications: [name] Patient Arrived: Wheel Chair Any new allergies or adverse reactions: [name] Arrival Time: 10:59 Had a fall or experienced change in [name] Accompanied By: husband activities of daily living that may affect Transfer Assistance: Manual risk of falls: Patient Identification Verified: Yes Signs or symptoms of abuse/neglect since last [name] Secondary Verification Process Completed: Yes visito Patient Requires Transmission-Based Precautions: [name] Hospitalized since last visit: [name] Patient Has Alerts: Yes Implantable device outside of the clinic [name] Patient Alerts: 5/23 ABI: L1.01 R 1.10 excluding 5/23 TBI: L0.75 R 0.90 cellular tissue based products placed in the center since last visit: Has Dressing in Place as Prescribed: [name] Has Compression in Place as Prescribed: [name] Has Footwear/Offloading in Place as Yes Prescribed: Left: Removable Cast Walker/Walking Boot Right: Removable Cast Walker/Walking Boot Pain Present Now: Yes Electronic Signature(s) Signed: 10/18/2021 4:57:59 PM By: [name] RN, BSN Entered By: [name] on 10/18/2021 11:01:01 -------------------------------------------------------------------------------- Clinic Level of Care Assessment Details Patient Name: Date of Service: [name], Nancy [name]. 10/18/2021 11:00 A [name] Medical Record Number: [id number] Patient Account Number:  [id number] Date of Birth/Sex: Treating RN: 1960/10/31 (61 y.o. [name] Primary Care Kyree Fedorko: A [facility] [place] Nancy Other Clinician: Referring Terrin Meddaugh: Treating Drequan Ironside/Extender: Sharin Mons [facility] [place] Nancy Weeks in Treatment: 5 Clinic Level of Care Assessment Items TOOL 4 Quantity Score X- 1 0 Use when only an EandM is performed on FOLLOW-UP visit ASSESSMENTS - Nursing Assessment / Reassessment  - 0 Reassessment of Co-morbidities (includes updates in patient status)  - 0 Reassessment of Adherence to Treatment Plan ASSESSMENTS - Wound and Skin A ssessment / Reassessment  - 0 Simple Wound Assessment / Reassessment - one wound X- 2 5 Complex Wound Assessment / Reassessment - multiple wounds [name], [name] ([id number]) [id number] Page 2 of 11  - 0 Dermatologic / Skin Assessment (not related to wound area) ASSESSMENTS - Focused Assessment X- 2 5 Circumferential Edema Measurements - multi extremities  - 0 Nutritional Assessment / Counseling / Intervention  - 0 Lower Extremity Assessment (monofilament, tuning fork, pulses)  - 0 Peripheral Arterial Disease Assessment (using hand held doppler) ASSESSMENTS - Ostomy and/or Continence Assessment and Care  - 0 Incontinence Assessment and Management  - 0 Ostomy Care Assessment and Management (repouching, etc.) PROCESS - Coordination of Care  - 0 Simple Patient / Family Education for ongoing care X- 1 20 Complex (extensive) Patient / Family Education for ongoing care X- 1 10 Staff obtains Chiropractor, Records, T Results / Process Orders est  - 0 Staff telephones HHA, Nursing Homes / Clarify orders / etc  - 0 Routine Transfer to another Facility (non-emergent condition)  - 0 Routine Hospital Admission (non-emergent condition)  - 0 New Admissions / Manufacturing engineer / Ordering NPWT Apligraf, etc. ,  - 0 Emergency Hospital Admission  (emergent condition)  - 0 Simple Discharge Coordination X- 1 15 Complex (extensive) Discharge Coordination PROCESS - Special Needs  - 0 Pediatric / Minor Patient  Management []  - 0 Isolation Patient Management []  - 0 Hearing / Language / Visual special needs []  - 0 Assessment of Community assistance (transportation, D/C planning, etc.) []  - 0 Additional assistance / Altered mentation []  - 0 Support Surface(s) Assessment (bed, cushion, seat, etc.) INTERVENTIONS - Wound Cleansing / Measurement []  - 0 Simple Wound Cleansing - one wound X- 2 5 Complex Wound Cleansing - multiple wounds X- 1 5 Wound Imaging (photographs - any number of wounds) []  - 0 Wound Tracing (instead of photographs) []  - 0 Simple Wound Measurement - one wound X- 2 5 Complex Wound Measurement - multiple wounds INTERVENTIONS - Wound Dressings []  - 0 Small Wound Dressing one or multiple wounds []  - 0 Medium Wound Dressing one or multiple wounds X- 2 20 Large Wound Dressing one or multiple wounds []  - 0 Application of Medications - topical []  - 0 Application of Medications - injection INTERVENTIONS - Miscellaneous []  - 0 External ear exam []  - 0 Specimen Collection (cultures, biopsies, blood, body fluids, etc.) []  - 0 Specimen(s) / Culture(s) sent or taken to Lab for analysis []  - 0 Patient Transfer (multiple staff / / Similar devices) [name], [name] ( ) [id number] Page 3 of 11 []  - 0 Simple Staple / Suture removal (25 or less) []  - 0 Complex Staple / Suture removal (26 or more) []  - 0 Hypo / Hyperglycemic Management (close monitor of Blood Glucose) []  - 0 Ankle / Brachial Index (ABI) - do not check if billed separately X- 1 5 Vital Signs Has the patient been seen at the hospital within the last three years: Yes Total Score: 135 Level Of Care: New/Established - Level 4 Electronic Signature(s) Signed: 10/18/2021 4:57:59 PM By:  RN, BSN Entered By: on 10/18/2021 11:26:49 -------------------------------------------------------------------------------- Encounter Discharge Information Details Patient Name: Date of Service: [name], Nancy [name]. 10/18/2021 11:00 A [name] Medical Record Number: Patient Account Number: Date of Birth/Sex: Treating RN: [birth year] (61 y.o. Primary Care Adriauna Campton: A [facility] [place] Nancy Other Clinician: Referring Maryan Sivak: Treating Augustine Leverette/Extender: [facility] [place] Nancy Weeks in Treatment: 5 Encounter Discharge Information Items Discharge Condition: Stable Ambulatory Status: Wheelchair Discharge Destination: Home Transportation: Private Auto Accompanied By: husband Schedule Follow-up Appointment: Yes Clinical Summary of Care: Electronic Signature(s) Signed: 10/18/2021 4:57:59 PM By: [employer] RN, BSN Entered By: Dorina Hoyer on 10/18/2021 11:27:59 -------------------------------------------------------------------------------- Lower Extremity Assessment Details Patient Name: Date of Service: [name], Nancy [name]. 10/18/2021 11:00 A [name] Medical Record Number: Patient Account Number: Date of Birth/Sex: Treating RN: [birth year] (61 y.o. 12/18/2021 Primary Care Cattaleya Wien: A [facility] [place] Nancy Other Clinician: Referring Maydell Knoebel: Treating Mayford Alberg/Extender: [name] A [facility] [place] Nancy Weeks in Treatment: 5 Edema Assessment Assessed: [Left: Yes] [Right: Yes] Edema: [Left: Yes] [Right: Yes] Calf Left: Right: Point of Measurement: 27 cm From Medial Instep 39.5 cm 42.5 cm Ankle [name], [name] (12/18/2021) [id number] Page 4 of 11 Left: Right: Point of Measurement: 9 cm From Medial Instep 25 cm 30 cm Vascular Assessment Pulses: Dorsalis Pedis Palpable: [Left:Yes] [Right:Yes] Electronic Signature(s) Signed: 10/18/2021 4:57:59 PM By:  [id number] RN, BSN Entered By: 02/08/1960 on 10/18/2021 11:05:38 -------------------------------------------------------------------------------- Multi Wound Chart Details Patient Name: Date of Service: [name], Nancy [name]. 10/18/2021 11:00 A [name] Medical Record Number: Sharin Mons Patient Account Number: 12/18/2021 Date of Birth/Sex: Treating RN: November 26, 1960 (61 y.o.  [name], [name] Primary Care Evaleigh Mccamy: A [facility] [place] Nancy Other Clinician: Referring Peola Joynt: Treating Spyridon Hornstein/Extender: Sharin Mons [facility] [place] Nancy Weeks in Treatment: 5 Vital Signs Height(in): 64 Pulse(bpm): 61 Weight(lbs): 135 Blood Pressure(mmHg): 140/88 Body Mass Index(BMI): 23.2 Temperature(F): 97.8 Respiratory Rate(breaths/min): 20 [1:Photos:] [N/A:N/A] Right, Circumferential Lower Leg Left, Circumferential Lower Leg N/A Wound Location: Gradually Appeared Gradually Appeared N/A Wounding Event: Lymphedema Lymphedema N/A Primary Etiology: Anemia, Lymphedema, Pneumothorax, Anemia, Lymphedema, Pneumothorax, N/A Comorbid History: Hypertension, Osteoarthritis, Seizure Hypertension, Osteoarthritis, Seizure Disorder Disorder 06/04/2021 06/04/2021 N/A Date Acquired: 5 5 N/A Weeks of Treatment: Open Open N/A Wound Status: [name] [name] N/A Wound Recurrence: Yes Yes N/A Clustered Wound: 2 4 N/A Clustered Quantity: 15x43x0.1 9x16x0.1 N/A Measurements L x W x D (cm) 506.582 113.097 N/A A (cm) : rea 50.658 11.31 N/A Volume (cm) : 26.90% 17.90% N/A % Reduction in Area: 26.90% 59.00% N/A % Reduction in Volume: Full Thickness Without Exposed Full Thickness Without Exposed N/A Classification: Support Structures Support Structures Medium Medium N/A Exudate Amount: Serosanguineous Serosanguineous N/A Exudate Type: red, brown red, brown N/A Exudate Color: Distinct, outline attached Distinct, outline attached N/A Wound Margin: Large (67-100%) Large (67-100%) N/A Granulation  Amount: Pink, Pale N/A N/A Granulation Quality: None Present (0%) None Present (0%) N/A Necrotic Amount: Fat Layer (Subcutaneous Tissue): Yes Fat Layer (Subcutaneous Tissue): Yes N/A Exposed Structures: Fascia: [name] Fascia: [name] [name], [name] ([id number]) [id number] Page 5 of 11 Tendon: [name] Tendon: [name] Muscle: [name] Muscle: [name] Joint: [name] Joint: [name] Bone: [name] Bone: [name] Medium (34-66%) Medium (34-66%) N/A Epithelialization: Excoriation: [name] Excoriation: [name] N/A Periwound Skin Texture: Induration: [name] Induration: [name] Callus: [name] Callus: [name] Crepitus: [name] Crepitus: [name] Rash: [name] Rash: [name] Scarring: [name] Scarring: [name] Maceration: Yes Maceration: Yes N/A Periwound Skin Moisture: Dry/Scaly: [name] Dry/Scaly: [name] Atrophie Blanche: [name] Atrophie Blanche: [name] N/A Periwound Skin Color: Cyanosis: [name] Cyanosis: [name] Ecchymosis: [name] Ecchymosis: [name] Erythema: [name] Erythema: [name] Hemosiderin Staining: [name] Hemosiderin Staining: [name] Mottled: [name] Mottled: [name] Pallor: [name] Pallor: [name] Rubor: [name] Rubor: [name] Treatment Notes Wound #1 (Lower Leg) Wound Laterality: Right, Circumferential Cleanser Soap and Water Discharge Instruction: May shower and wash wound with dial antibacterial soap and water prior to dressing change. Peri-Wound Care Topical Primary Dressing Secondary Dressing ABD Pad, 8x10 Discharge Instruction: Apply over primary dressing as directed. Secured With [employer], 4.5x3.1 (in/yd) Discharge Instruction: Secure with Kerlix as directed. 67M Medipore H Soft Cloth Surgical T ape, 4 x 10 (in/yd) Discharge Instruction: Secure with tape as directed. Compression Wrap Compression Stockings Add-Ons Wound #2 (Lower Leg) Wound Laterality: Left, Circumferential Cleanser Soap and Water Discharge Instruction: May shower and wash wound with dial antibacterial soap and water prior to dressing change. Peri-Wound Care Topical Primary Dressing Secondary Dressing ABD Pad,  8x10 Discharge Instruction: Apply over primary dressing as directed. Secured With [employer], 4.5x3.1 (in/yd) Discharge Instruction: Secure with Kerlix as directed. 67M Medipore H Soft Cloth Surgical T ape, 4 x 10 (in/yd) Discharge Instruction: Secure with tape as directed. Compression Wrap Compression Stockings Add-Ons [name], [name] ([id number]) [id number] Page 6 of 11 Electronic Signature(s) Signed: 10/18/2021 12:26:27 PM By: Geralyn Corwin DO Signed: 10/18/2021 4:57:59 PM By: [name] RN, BSN Entered By: Geralyn Corwin on 10/18/2021 11:30:31 -------------------------------------------------------------------------------- Multi-Disciplinary Care Plan Details Patient Name: Date of Service: [name], Nancy [name]. 10/18/2021 11:00 A [name] Medical Record Number: [id number] Patient Account Number: [id number] Date of Birth/Sex: Treating RN: 12-06-1960 (61 y.o. [name] Primary Care  Ronnesha Mester: A [facility] [place] Nancy Other Clinician: Referring Keeyon Privitera: Treating Ayriel Texidor/Extender: Geralyn Corwin A [facility] [place] Nancy Weeks in Treatment: 5 Active Inactive Venous Leg Ulcer Nursing Diagnoses: Knowledge deficit related to disease process and management Goals: Patient will maintain optimal edema control Date Initiated: 09/07/2021 Target Resolution Date: 11/05/2021 Goal Status: Active Interventions: Assess peripheral edema status every visit. Compression as ordered Treatment Activities: Therapeutic compression applied : 09/07/2021 Notes: 09/20/21: Edema control ongoing, using 3 layer wrap. Wound/Skin Impairment Nursing Diagnoses: Knowledge deficit related to ulceration/compromised skin integrity Goals: Patient/caregiver will verbalize understanding of skin care regimen Date Initiated: 09/07/2021 Target Resolution Date: 11/05/2021 Goal Status: Active Interventions: Assess patient/caregiver ability to perform ulcer/skin care  regimen upon admission and as needed Assess ulceration(s) every visit Provide education on ulcer and skin care Treatment Activities: Skin care regimen initiated : 09/07/2021 Topical wound management initiated : 09/07/2021 Notes: 09/20/21: Wound care regimen continues. Electronic Signature(s) Signed: 10/18/2021 4:57:59 PM By: [name] RN, BSN Entered By: [name] on 10/18/2021 11:21:41 Dorina Hoyer ([id number]) [id number] Page 7 of 11 -------------------------------------------------------------------------------- Pain Assessment Details Patient Name: Date of Service: [name], Nancy [name]. 10/18/2021 11:00 A [name] Medical Record Number: [id number] Patient Account Number: [id number] Date of Birth/Sex: Treating RN: 1960/10/16 (61 y.o. [name] Primary Care Kattleya Kuhnert: A [facility] [place] Nancy Other Clinician: Referring Arora Coakley: Treating Zamere Pasternak/Extender: Sharin Mons [facility] [place] Nancy Weeks in Treatment: 5 Active Problems Location of Pain Severity and Description of Pain Patient Has Paino Yes Site Locations Rate the pain. Current Pain Level: 4 Pain Management and Medication Current Pain Management: Medication: [name] Cold Application: [name] Rest: [name] Massage: [name] Activity: [name] T.E.N.S.: [name] Heat Application: [name] Leg drop or elevation: [name] Is the Current Pain Management Adequate: Adequate How does your wound impact your activities of daily livingo Sleep: [name] Bathing: [name] Appetite: [name] Relationship With Others: [name] Bladder Continence: [name] Emotions: [name] Bowel Continence: [name] Work: [name] Toileting: [name] Drive: [name] Dressing: [name] Hobbies: [name] Psychologist, prison and probation services) Signed: 10/18/2021 4:57:59 PM By: [name] RN, BSN Entered By: [name] on 10/18/2021 11:01:11 -------------------------------------------------------------------------------- Patient/Caregiver Education Details Patient Name: Date of Service: [name], Nancy [name].  10/9/2023andnbsp11:00 A [name] Medical Record Number: [id number] Patient Account Number: [id number] Date of Birth/Gender: Treating RN: 1961-01-08 (61 y.o. [name], [name] Primary Care Physician: A [facility] [place] Nancy Other Clinician: Referring Physician: Treating Physician/Extender: Sharin Mons [facility] [place] Nancy Weeks in Treatment: 5 [name], [name] ([id number]) [id number] Page 8 of 11 Education Assessment Education Provided To: Patient Education Topics Provided Wound/Skin Impairment: Handouts: Skin Care Do's and Dont's Methods: Explain/Verbal Responses: Reinforcements needed Electronic Signature(s) Signed: 10/18/2021 4:57:59 PM By: [name] RN, BSN Entered By: [name] on 10/18/2021 11:21:54 -------------------------------------------------------------------------------- Wound Assessment Details Patient Name: Date of Service: [name], Nancy [name]. 10/18/2021 11:00 A [name] Medical Record Number: [id number] Patient Account Number: [id number] Date of Birth/Sex: Treating RN: [birth year] (61 y.o. [name] Primary Care Keiffer Piper: A [facility] [place] Nancy Other Clinician: Referring Clever Geraldo: Treating Tsering Leaman/Extender: Sharin Mons [facility] [place] Nancy Weeks in Treatment: 5 Wound Status Wound Number: 1 Primary Lymphedema Etiology: Wound Location: Right, Circumferential Lower Leg Wound Open Wounding Event: Gradually Appeared Status: Date Acquired: 06/04/2021 Comorbid Anemia, Lymphedema, Pneumothorax, Hypertension, Weeks Of Treatment: 5 History: Osteoarthritis, Seizure Disorder Clustered Wound: Yes Photos Wound Measurements Length: (cm) 1 Width: (cm) 4 Depth: (cm) 0 Clustered Quantity: 2 Area: (  cm) Volume: (cm) 5 % Reduction in Area: 26.9% 3 % Reduction in Volume: 26.9% .1 Epithelialization: Medium (34-66%) Tunneling: [name] 506.582 Undermining: [name] 50.658 Wound Description Classification: Full Thickness  Without Exposed Supp Wound Margin: Distinct, outline attached Exudate Amount: Medium Exudate Type: Serosanguineous Exudate Color: red, brown ort Structures Foul Odor After Cleansing: [name] Slough/Fibrino [name] Wound Bed KRISTINAMARIE, LAB ([id number]) [id number] Page 9 of 11 Granulation Amount: Large (67-100%) Exposed Structure Granulation Quality: Pink, Pale Fascia Exposed: [name] Necrotic Amount: None Present (0%) Fat Layer (Subcutaneous Tissue) Exposed: Yes Tendon Exposed: [name] Muscle Exposed: [name] Joint Exposed: [name] Bone Exposed: [name] Periwound Skin Texture Texture Color [name] Abnormalities Noted: [name] [name] Abnormalities Noted: [name] Callus: [name] Atrophie Blanche: [name] Crepitus: [name] Cyanosis: [name] Excoriation: [name] Ecchymosis: [name] Induration: [name] Erythema: [name] Rash: [name] Hemosiderin Staining: [name] Scarring: [name] Mottled: [name] Pallor: [name] Moisture Rubor: [name] [name] Abnormalities Noted: [name] Dry / Scaly: [name] Maceration: Yes Treatment Notes Wound #1 (Lower Leg) Wound Laterality: Right, Circumferential Cleanser Soap and Water Discharge Instruction: May shower and wash wound with dial antibacterial soap and water prior to dressing change. Peri-Wound Care Topical Primary Dressing Secondary Dressing ABD Pad, 8x10 Discharge Instruction: Apply over primary dressing as directed. Secured With [employer], 4.5x3.1 (in/yd) Discharge Instruction: Secure with Kerlix as directed. 14M Medipore H Soft Cloth Surgical T ape, 4 x 10 (in/yd) Discharge Instruction: Secure with tape as directed. Compression Wrap Compression Stockings Add-Ons Electronic Signature(s) Signed: 10/18/2021 4:57:59 PM By: [name] RN, BSN Entered By: [name] on 10/18/2021 11:14:06 -------------------------------------------------------------------------------- Wound Assessment Details Patient Name: Date of Service: [name], Nancy [name]. 10/18/2021 11:00 A [name] Medical Record Number: [id number] Patient Account  Number: [id number] Date of Birth/Sex: Treating RN: [birth year] (61 y.o. [name] Primary Care Ayo Smoak: A [facility] [place] Nancy Other Clinician: Referring Radley Barto: Treating Kandon Hosking/Extender: Sharin Mons [facility] [place] Nancy Weeks in Treatment: 5 Wound Status Wound Number: 2 Primary Lymphedema Etiology: Wound Location: Left, Circumferential Lower Leg TRINIECE, ZAJICEK ([id number]) [id number] Page 10 of 11 Wound Open Wounding Event: Gradually Appeared Status: Date Acquired: 06/04/2021 Comorbid Anemia, Lymphedema, Pneumothorax, Hypertension, Weeks Of Treatment: 5 History: Osteoarthritis, Seizure Disorder Clustered Wound: Yes Photos Wound Measurements Length: (cm) 9 Width: (cm) 16 Depth: (cm) 0.1 Clustered Quantity: 4 Area: (cm) 113.097 Volume: (cm) 11.31 % Reduction in Area: 17.9% % Reduction in Volume: 59% Epithelialization: Medium (34-66%) Tunneling: [name] Undermining: [name] Wound Description Classification: Full Thickness Without Exposed Supp Wound Margin: Distinct, outline attached Exudate Amount: Medium Exudate Type: Serosanguineous Exudate Color: red, brown ort Structures Foul Odor After Cleansing: [name] Slough/Fibrino [name] Wound Bed Granulation Amount: Large (67-100%) Exposed Structure Necrotic Amount: None Present (0%) Fascia Exposed: [name] Fat Layer (Subcutaneous Tissue) Exposed: Yes Tendon Exposed: [name] Muscle Exposed: [name] Joint Exposed: [name] Bone Exposed: [name] Periwound Skin Texture Texture Color [name] Abnormalities Noted: [name] [name] Abnormalities Noted: [name] Callus: [name] Atrophie Blanche: [name] Crepitus: [name] Cyanosis: [name] Excoriation: [name] Ecchymosis: [name] Induration: [name] Erythema: [name] Rash: [name] Hemosiderin Staining: [name] Scarring: [name] Mottled: [name] Pallor: [name] Moisture Rubor: [name] [name] Abnormalities Noted: [name] Dry / Scaly: [name] Maceration: Yes Treatment Notes Wound #2 (Lower Leg) Wound Laterality: Left, Circumferential Cleanser Soap and  Water Discharge Instruction: May shower and wash wound with dial antibacterial soap and water prior to dressing change. Peri-Wound Care Topical Primary Dressing Secondary Dressing ABD Pad, 8x10 Discharge Instruction: Apply over primary dressing as directed. KAELAH, STUMPF ([id number]) [id number] Page 11 of 11 Secured With [employer]  [employer], 4.5x3.1 (in/yd) Discharge Instruction: Secure with Kerlix as directed. 79M Medipore H Soft Cloth Surgical T ape, 4 x 10 (in/yd) Discharge Instruction: Secure with tape as directed. Compression Wrap Compression Stockings Add-Ons Electronic Signature(s) Signed: 10/18/2021 4:57:59 PM By: Deon Pilling RN, BSN Entered By: Deon Pilling on 10/18/2021 11:12:48 -------------------------------------------------------------------------------- Vitals Details Patient Name: Date of Service: [name], Nancy [name]. 10/18/2021 11:00 A [name] Medical Record Number: [id number] Patient Account Number: [id number] Date of Birth/Sex: Treating RN: [birth year] (61 y.o. [name], [name] Primary Care Zahriah Roes: A [facility], [place] Nancy Other Clinician: Referring Jacqui Headen: Treating Xzandria Clevinger/Extender: Kalman Shan A [facility], [place] Nancy Weeks in Treatment: 5 Vital Signs Time Taken: 11:00 Temperature (F): 97.8 Height (in): 64 Pulse (bpm): 61 Weight (lbs): 135 Respiratory Rate (breaths/min): 20 Body Mass Index (BMI): 23.2 Blood Pressure (mmHg): 140/88 Reference Range: 80 - 120 mg / dl Electronic Signature(s) Signed: 10/18/2021 4:57:59 PM By: Deon Pilling RN, BSN Entered By: Deon Pilling on 10/18/2021 11:02:42

## 2021-10-19 DIAGNOSIS — S81802A Unspecified open wound, left lower leg, initial encounter: Secondary | ICD-10-CM | POA: Diagnosis not present

## 2021-10-19 DIAGNOSIS — J9601 Acute respiratory failure with hypoxia: Secondary | ICD-10-CM | POA: Diagnosis not present

## 2021-10-19 DIAGNOSIS — R1310 Dysphagia, unspecified: Secondary | ICD-10-CM | POA: Diagnosis not present

## 2021-10-19 DIAGNOSIS — R5381 Other malaise: Secondary | ICD-10-CM | POA: Diagnosis not present

## 2021-10-19 DIAGNOSIS — A419 Sepsis, unspecified organism: Secondary | ICD-10-CM | POA: Diagnosis not present

## 2021-10-19 DIAGNOSIS — S81809A Unspecified open wound, unspecified lower leg, initial encounter: Secondary | ICD-10-CM | POA: Diagnosis not present

## 2021-10-25 ENCOUNTER — Encounter (HOSPITAL_BASED_OUTPATIENT_CLINIC_OR_DEPARTMENT_OTHER): Payer: BC Managed Care – PPO | Admitting: Internal Medicine

## 2021-10-25 DIAGNOSIS — L97812 Non-pressure chronic ulcer of other part of right lower leg with fat layer exposed: Secondary | ICD-10-CM | POA: Diagnosis not present

## 2021-10-25 DIAGNOSIS — I890 Lymphedema, not elsewhere classified: Secondary | ICD-10-CM | POA: Diagnosis not present

## 2021-10-25 DIAGNOSIS — L97822 Non-pressure chronic ulcer of other part of left lower leg with fat layer exposed: Secondary | ICD-10-CM | POA: Diagnosis not present

## 2021-10-25 DIAGNOSIS — I87313 Chronic venous hypertension (idiopathic) with ulcer of bilateral lower extremity: Secondary | ICD-10-CM | POA: Diagnosis not present

## 2021-10-25 DIAGNOSIS — G35 Multiple sclerosis: Secondary | ICD-10-CM | POA: Diagnosis not present

## 2021-10-25 DIAGNOSIS — J449 Chronic obstructive pulmonary disease, unspecified: Secondary | ICD-10-CM | POA: Diagnosis not present

## 2021-10-27 DIAGNOSIS — S81809A Unspecified open wound, unspecified lower leg, initial encounter: Secondary | ICD-10-CM | POA: Diagnosis not present

## 2021-10-29 NOTE — Progress Notes (Signed)
AMULYA, QUINTIN ([id number]) [id number] Page 1 of 8 Visit Report for 10/25/2021 Chief Complaint Document Details Patient Name: Date of Service: [name], [name] 10/25/2021 1:30 PM Medical Record Number: [id number] Patient Account Number: [id number] Date of Birth/Sex: Treating RN: 1960/07/19 (61 y.o. [name] Primary Care Provider: A [facility], [place] Other Clinician: Referring Provider: Treating Provider/Extender: Britt Bolognese [facility], [place] Weeks in Treatment: 6 Information Obtained from: Patient Chief Complaint 09/07/2021; bilateral lower extremity wounds Electronic Signature(s) Signed: 10/25/2021 3:56:07 PM By: Kalman Shan DO Entered By: Kalman Shan on 10/25/2021 14:38:20 -------------------------------------------------------------------------------- HPI Details Patient Name: Date of Service: [name], [name] 10/25/2021 1:30 PM Medical Record Number: [id number] Patient Account Number: [id number] Date of Birth/Sex: Treating RN: Jun 29, 1960 (61 y.o. [name] Primary Care Provider: A [facility], [place] Other Clinician: Referring Provider: Treating Provider/Extender: Britt Bolognese [facility], [place] Weeks in Treatment: 6 History of Present Illness HPI Description: Admission 09/07/2021 [name] is a 61 year old female with a past medical history of COPD, multiple sclerosis and lymphedema that presents the clinic for a 43-monthhistory of nonhealing ulcers to her lower extremities bilaterally. She was recently seen in the ED on 8/11 for left lower cellulitis and given IV antibiotics and discharged on oral Augmentin. She completed this on 8/19. Patient does not wear compression stockings. She does not have a history of wounds previously. She is not on a diuretic. 9/7; patient presents for follow-up. She states that she needed to take the wraps off yesterday due to drainage coming  through. We have been using silver alginate to the right lower extremity wound and gentamicin and mupirocin ointment with calcium alginate to the left lower extremity wound all under Kerlix/Coban. She currently denies signs of infection. 9/11; patient presents for follow-up. She was able to keep the wraps in place. We have been using silver alginate to the right lower extremity and silver alginate and Iodoflex to the left lower extremity all under 3 layer compression. She notes that the Iodoflex caused her irritation. She currently denies signs of infection. 9/18; patient presents for follow-up. We have been using silver alginate to the right lower extremity and antibiotic ointment with calcium alginate to the left lower extremity all under 3 layer compression. She states that the wraps felt tight and had to take them off last night. Other than that she has no issues or complaints today. 9/25; patient presents for follow-up. We have been using silver alginate and with antibiotic ointment to the lower extremities bilaterally with compression. Patient has tolerated this well. She has no issues or complaints today. 10/2; patient presents for follow-up. We have been using silver alginate and antibiotic ointment under compression therapy. She states that the wrap was uncomfortable and took it off last night. She has significant swelling on exam. She is not on a diuretic. 10/9; patient presents for follow-up. We have been using silver alginate under 3 layer compression bilaterally to the lower extremities. She took the wraps off yesterday due to discomfort. She would like to try her juxta lite compression instead of in office wraps. She denies signs of infection. She states she was recently started on a very low-dose of a diuretic. 10/16; patient presents for follow-up. She declined compression wraps at last clinic visit and used her juxta lite compression Velcro garments instead. She states she did well  with this. She is receiving her lymphedema pumps tomorrow.  JANANN, BOEVE ([id number]) [id number] Page 2 of 8 Electronic Signature(s) Signed: 10/25/2021 3:56:07 PM By: Kalman Shan DO Entered By: Kalman Shan on 10/25/2021 14:38:48 -------------------------------------------------------------------------------- Physical Exam Details Patient Name: Date of Service: [name], [name] 10/25/2021 1:30 PM Medical Record Number: [id number] Patient Account Number: [id number] Date of Birth/Sex: Treating RN: 11/10/1960 (61 y.o. [name] Primary Care Provider: A [facility], [place] Other Clinician: Referring Provider: Treating Provider/Extender: Britt Bolognese [facility], [place] Weeks in Treatment: 6 Constitutional respirations regular, non-labored and within target range for patient.. Cardiovascular 2+ dorsalis pedis/posterior tibialis pulses. Psychiatric pleasant and cooperative. Notes Right lower extremity: 3+ pitting edema to the thigh. papilomatosis cutis lymphostatica. Scattered Open wounds with weeping T the lower extremity. No o increased warmth or purulent drainage noted. Left lower extremity: Scattered open wounds with granulation tissue. serous drainage. 3+ pitting edema to the thigh. No increased warmth or purulent drainage noted. Venous stasis dermatitis. Electronic Signature(s) Signed: 10/25/2021 3:56:07 PM By: Kalman Shan DO Entered By: Kalman Shan on 10/25/2021 14:39:14 -------------------------------------------------------------------------------- Physician Orders Details Patient Name: Date of Service: [name], [name] 10/25/2021 1:30 PM Medical Record Number: [id number] Patient Account Number: [id number] Date of Birth/Sex: Treating RN: April 12, 1960 (61 y.o. Donalda Ewings Primary Care Provider: A [facility], [place] Other Clinician: Referring Provider: Treating Provider/Extender: Britt Bolognese [facility], [place] Weeks in Treatment: 6 Verbal / Phone Orders: No Diagnosis Coding Follow-up Appointments ppointment in 1 week. - Dr. Heber [place] and [place], Room 8 Monday Return A ppointment in 2 weeks. - Dr. Heber [place] and Tammi Klippel, Room 8 Monday Return A Other: - [facility] will send your wound care supplies. Bathing/ Shower/ Hygiene May shower and wash wound with soap and water. Edema Control - Lymphedema / SCD / Other Lymphedema Pumps. Use Lymphedema pumps on leg(s) 2-3 times a day for 45-60 minutes. If wearing any wraps or hose, do not remove them. Continue exercising as instructed. - start with 2 times a day throughout the day. Elevate legs to the level of the heart or above for 30 minutes daily and/or when sitting, a frequency of: - 3-4 times a day throughout the day. Avoid standing for long periods of time. Exercise regularly Compression stocking or Garment 30-40 mm/Hg pressure to: - Apply in the morning and remove at night. Wound Treatment SHAYE, ELLING ([id number]) [id number] Page 3 of 8 Wound #1 - Lower Leg Wound Laterality: Right, Circumferential Cleanser: Soap and Water 1 x Per Day/30 Days Discharge Instructions: May shower and wash wound with dial antibacterial soap and water prior to dressing change. Prim Dressing: KerraCel Ag Gelling Fiber Dressing, 4x5 in (silver alginate) (DME) (Generic) 1 x Per Day/30 Days ary Discharge Instructions: Apply silver alginate to wound bed as instructed Secondary Dressing: ABD Pad, 8x10 (Generic) 1 x Per Day/30 Days Discharge Instructions: Apply over primary dressing as directed. Secondary Dressing: Woven Gauze Sponge, Non-Sterile 4x4 in (DME) (Generic) 1 x Per Day/30 Days Discharge Instructions: Apply over primary dressing as directed. Secured With: [employer], 4.5x3.1 (in/yd) (Generic) 1 x Per Day/30 Days Discharge Instructions: Secure with Kerlix as directed. Secured With: 79M Medipore H Soft  Cloth Surgical T ape, 4 x 10 (in/yd) (Generic) 1 x Per Day/30 Days Discharge Instructions: Secure with tape as directed. Wound #2 - Lower Leg Wound Laterality: Left, Circumferential Cleanser: Soap and Water 1 x Per Day/30 Days Discharge Instructions: May shower and wash wound with dial antibacterial  soap and water prior to dressing change. Prim Dressing: KerraCel Ag Gelling Fiber Dressing, 4x5 in (silver alginate) (DME) (Generic) 1 x Per Day/30 Days ary Discharge Instructions: Apply silver alginate to wound bed as instructed Secondary Dressing: ABD Pad, 8x10 (Generic) 1 x Per Day/30 Days Discharge Instructions: Apply over primary dressing as directed. Secondary Dressing: Woven Gauze Sponge, Non-Sterile 4x4 in (DME) (Generic) 1 x Per Day/30 Days Discharge Instructions: Apply over primary dressing as directed. Secured With: [employer], 4.5x3.1 (in/yd) (Generic) 1 x Per Day/30 Days Discharge Instructions: Secure with Kerlix as directed. Secured With: 66M Medipore H Soft Cloth Surgical T ape, 4 x 10 (in/yd) (Generic) 1 x Per Day/30 Days Discharge Instructions: Secure with tape as directed. Patient Medications llergies: Lamictal, cats, shrimp scampi sauce, tramadol A Notifications Medication Indication Start End prior to debridement 10/25/2021 lidocaine DOSE topical 5 % ointment - ointment topical Electronic Signature(s) Signed: 10/25/2021 3:56:07 PM By: Kalman Shan DO Signed: 10/25/2021 1:44:39 PM By: Sharyn Creamer RN, BSN Entered By: Sharyn Creamer on 10/25/2021 14:47:18 -------------------------------------------------------------------------------- Problem List Details Patient Name: Date of Service: [name], [name] 10/25/2021 1:30 PM Medical Record Number: [id number] Patient Account Number: [id number] Date of Birth/Sex: Treating RN: 1960-03-16 (61 y.o. [name] Primary Care Provider: A [facility], [place] Other Clinician: Referring Provider: Treating  Provider/Extender: Britt Bolognese [facility], [place] Weeks in Treatment: 6 Active Problems ICD-10 Encounter Code Description Active Date MDM DELMAR, DONDERO ([id number]) [id number] Page 4 of 8 Code Description Active Date MDM Diagnosis L97.812 Non-pressure chronic ulcer of other part of right lower leg with fat layer 09/07/2021 No Yes exposed L97.822 Non-pressure chronic ulcer of other part of left lower leg with fat layer exposed8/29/2023 No Yes I89.0 Lymphedema, not elsewhere classified 09/07/2021 No Yes I87.313 Chronic venous hypertension (idiopathic) with ulcer of bilateral lower extremity 09/07/2021 No Yes J44.9 Chronic obstructive pulmonary disease, unspecified 09/07/2021 No Yes G35 Multiple sclerosis 09/07/2021 No Yes Inactive Problems Resolved Problems Electronic Signature(s) Signed: 10/25/2021 3:56:07 PM By: Kalman Shan DO Entered By: Kalman Shan on 10/25/2021 14:38:09 -------------------------------------------------------------------------------- Progress Note Details Patient Name: Date of Service: [name], [name] 10/25/2021 1:30 PM Medical Record Number: [id number] Patient Account Number: [id number] Date of Birth/Sex: Treating RN: Aug 14, 1960 (61 y.o. [name] Primary Care Provider: A [facility], [place] Other Clinician: Referring Provider: Treating Provider/Extender: Britt Bolognese [facility], [place] Weeks in Treatment: 6 Subjective Chief Complaint Information obtained from Patient 09/07/2021; bilateral lower extremity wounds History of Present Illness (HPI) Admission 09/07/2021 [name] is a 61 year old female with a past medical history of COPD, multiple sclerosis and lymphedema that presents the clinic for a 78-monthhistory of nonhealing ulcers to her lower extremities bilaterally. She was recently seen in the ED on 8/11 for left lower cellulitis and given IV antibiotics and discharged  on oral Augmentin. She completed this on 8/19. Patient does not wear compression stockings. She does not have a history of wounds previously. She is not on a diuretic. 9/7; patient presents for follow-up. She states that she needed to take the wraps off yesterday due to drainage coming through. We have been using silver alginate to the right lower extremity wound and gentamicin and mupirocin ointment with calcium alginate to the left lower extremity wound all under Kerlix/Coban. She currently denies signs of infection. 9/11; patient presents for follow-up. She was able to keep the wraps in place. We have been  using silver alginate to the right lower extremity and silver alginate and Iodoflex to the left lower extremity all under 3 layer compression. She notes that the Iodoflex caused her irritation. She currently denies signs of infection. 9/18; patient presents for follow-up. We have been using silver alginate to the right lower extremity and antibiotic ointment with calcium alginate to the left lower extremity all under 3 layer compression. She states that the wraps felt tight and had to take them off last night. Other than that she has no issues or complaints today. 9/25; patient presents for follow-up. We have been using silver alginate and with antibiotic ointment to the lower extremities bilaterally with compression. Patient has tolerated this well. She has no issues or complaints today. SHALUNDA, LINDH ([id number]) [id number] Page 5 of 8 10/2; patient presents for follow-up. We have been using silver alginate and antibiotic ointment under compression therapy. She states that the wrap was uncomfortable and took it off last night. She has significant swelling on exam. She is not on a diuretic. 10/9; patient presents for follow-up. We have been using silver alginate under 3 layer compression bilaterally to the lower extremities. She took the wraps off yesterday due to  discomfort. She would like to try her juxta lite compression instead of in office wraps. She denies signs of infection. She states she was recently started on a very low-dose of a diuretic. 10/16; patient presents for follow-up. She declined compression wraps at last clinic visit and used her juxta lite compression Velcro garments instead. She states she did well with this. She is receiving her lymphedema pumps tomorrow. Patient History Information obtained from Patient, Chart. Family History Cancer - Maternal Grandparents, Diabetes - Maternal Grandparents, Heart Disease - Father. Social History Current every day smoker - 1ppd, Marital Status - Married, Alcohol Use - Never, Drug Use - No History, Caffeine Use - Never. Medical History Hematologic/Lymphatic Patient has history of Anemia, Lymphedema Respiratory Patient has history of Pneumothorax - bilateral Cardiovascular Patient has history of Hypertension Musculoskeletal Patient has history of Osteoarthritis Neurologic Patient has history of Seizure Disorder Hospitalization/Surgery History - 06/2013 ORIF left toe. - bladder surgery 2007. - appendectomy. Medical A Surgical History Notes nd Constitutional Symptoms (General Health) HLA B27 positive Respiratory pulmonary nodules Gastrointestinal GERD IBS Genitourinary UTI Musculoskeletal MS Objective Constitutional respirations regular, non-labored and within target range for patient.. Vitals Time Taken: 1:55 PM, Height: 64 in, Weight: 135 lbs, BMI: 23.2, Temperature: 97.8 F, Pulse: 69 bpm, Respiratory Rate: 18 breaths/min, Blood Pressure: 146/78 mmHg. Cardiovascular 2+ dorsalis pedis/posterior tibialis pulses. Psychiatric pleasant and cooperative. General Notes: Right lower extremity: 3+ pitting edema to the thigh. papilomatosis cutis lymphostatica. Scattered Open wounds with weeping T the lower o extremity. No increased warmth or purulent drainage noted. Left lower  extremity: Scattered open wounds with granulation tissue. serous drainage. 3+ pitting edema to the thigh. No increased warmth or purulent drainage noted. Venous stasis dermatitis. Integumentary (Hair, Skin) Wound #1 status is Open. Original cause of wound was Gradually Appeared. The date acquired was: 06/04/2021. The wound has been in treatment 6 weeks. The wound is located on the Right,Circumferential Lower Leg. The wound measures 16.5cm length x 19cm width x 0.1cm depth; 246.222cm^2 area and 24.622cm^3 volume. There is Fat Layer (Subcutaneous Tissue) exposed. There is no tunneling or undermining noted. There is a medium amount of serosanguineous drainage noted. The wound margin is distinct with the outline attached to the wound base. There is large (67-100%) pink, pale granulation within the  wound bed. There is no necrotic tissue within the wound bed. The periwound skin appearance did not exhibit: Callus, Crepitus, Excoriation, Induration, Rash, Scarring, Dry/Scaly, Maceration, Atrophie Blanche, Cyanosis, Ecchymosis, Hemosiderin Staining, Mottled, Pallor, Rubor, Erythema. Periwound temperature was noted as No Abnormality. Wound #2 status is Open. Original cause of wound was Gradually Appeared. The date acquired was: 06/04/2021. The wound has been in treatment 6 weeks. The wound is located on the Left,Circumferential Lower Leg. The wound measures 15cm length x 18cm width x 0.1cm depth; 212.058cm^2 area and 21.206cm^3 volume. There is Fat Layer (Subcutaneous Tissue) exposed. There is no tunneling or undermining noted. There is a medium amount of serosanguineous drainage noted. The wound margin is distinct with the outline attached to the wound base. There is large (67-100%) granulation within the wound bed. There is no necrotic tissue within the wound bed. The periwound skin appearance did not exhibit: Callus, Crepitus, Excoriation, Induration, Rash, Scarring, ARLETHA, MARSCHKE ([id number])  [id number] Page 6 of 8 Maceration, Atrophie Blanche, Cyanosis, Ecchymosis, Hemosiderin Staining, Mottled, Pallor, Rubor, Erythema. Periwound temperature was noted as No Abnormality. Assessment Active Problems ICD-10 Non-pressure chronic ulcer of other part of right lower leg with fat layer exposed Non-pressure chronic ulcer of other part of left lower leg with fat layer exposed Lymphedema, not elsewhere classified Chronic venous hypertension (idiopathic) with ulcer of bilateral lower extremity Chronic obstructive pulmonary disease, unspecified Multiple sclerosis Patient has more swelling on exam today. I do not think she is getting enough edema control with her juxta lite Velcro wraps. I recommended She have her legs wrapped in office. She declined. She knows her wounds can worsen without proper edema control. At this time I recommended silver alginate under Kerlix with her juxta lite Velcro wraps. She is getting her lymphedema pumps tomorrow. I recommended she do this twice daily. Follow-up in 1 week. Plan Follow-up Appointments: Return Appointment in 1 week. - Dr. Heber [place] and Tammi Klippel, Room 8 Monday Return Appointment in 2 weeks. - Dr. Heber [place] and Tammi Klippel, Room 8 Monday Other: [facility] will send your wound care supplies. Bathing/ Shower/ Hygiene: May shower and wash wound with soap and water. Edema Control - Lymphedema / SCD / Other: Lymphedema Pumps. Use Lymphedema pumps on leg(s) 2-3 times a day for 45-60 minutes. If wearing any wraps or hose, do not remove them. Continue exercising as instructed. - start with 2 times a day throughout the day. Elevate legs to the level of the heart or above for 30 minutes daily and/or when sitting, a frequency of: - 3-4 times a day throughout the day. Avoid standing for long periods of time. Exercise regularly Compression stocking or Garment 30-40 mm/Hg pressure to: - Apply in the morning and remove at night. The following  medication(s) was prescribed: lidocaine topical 5 % ointment ointment topical for prior to debridement was prescribed at facility WOUND #1: - Lower Leg Wound Laterality: Right, Circumferential Cleanser: Soap and Water 1 x Per Day/30 Days Discharge Instructions: May shower and wash wound with dial antibacterial soap and water prior to dressing change. Prim Dressing: KerraCel Ag Gelling Fiber Dressing, 4x5 in (silver alginate) (DME) (Generic) 1 x Per Day/30 Days ary Discharge Instructions: Apply silver alginate to wound bed as instructed Secondary Dressing: ABD Pad, 8x10 (Generic) 1 x Per Day/30 Days Discharge Instructions: Apply over primary dressing as directed. Secured With: [employer], 4.5x3.1 (in/yd) (Generic) 1 x Per Day/30 Days Discharge Instructions: Secure with Kerlix as directed. Secured With: [place]  [facility] T ape, 4 x 10 (in/yd) (Generic) 1 x Per Day/30 Days Discharge Instructions: Secure with tape as directed. WOUND #2: - Lower Leg Wound Laterality: Left, Circumferential Cleanser: Soap and Water 1 x Per Day/30 Days Discharge Instructions: May shower and wash wound with dial antibacterial soap and water prior to dressing change. Prim Dressing: KerraCel Ag Gelling Fiber Dressing, 4x5 in (silver alginate) (DME) (Generic) 1 x Per Day/30 Days ary Discharge Instructions: Apply silver alginate to wound bed as instructed Secondary Dressing: ABD Pad, 8x10 (Generic) 1 x Per Day/30 Days Discharge Instructions: Apply over primary dressing as directed. Secured With: [employer], 4.5x3.1 (in/yd) (Generic) 1 x Per Day/30 Days Discharge Instructions: Secure with Kerlix as directed. Secured With: 71M Medipore H Soft Cloth Surgical T ape, 4 x 10 (in/yd) (Generic) 1 x Per Day/30 Days Discharge Instructions: Secure with tape as directed. 1. Silver alginate with Kerlix under Velcro wrap compression to the lower extremities bilaterally 2. Follow-up in 1  week Electronic Signature(s) Signed: 10/25/2021 3:56:07 PM By: Kalman Shan DO Entered By: Kalman Shan on 10/25/2021 14:43:55 Charm Barges ([id number]) [id number] Page 7 of 8 -------------------------------------------------------------------------------- HxROS Details Patient Name: Date of Service: [name], [name] 10/25/2021 1:30 PM Medical Record Number: [id number] Patient Account Number: [id number] Date of Birth/Sex: Treating RN: 1960/02/17 (61 y.o. [name] Primary Care Provider: A [facility], [place] Other Clinician: Referring Provider: Treating Provider/Extender: Britt Bolognese [facility], [place] Weeks in Treatment: 6 Information Obtained From Patient Chart Constitutional Symptoms (General Health) Medical History: Past Medical History Notes: HLA B27 positive Hematologic/Lymphatic Medical History: Positive for: Anemia; Lymphedema Respiratory Medical History: Positive for: Pneumothorax - bilateral Past Medical History Notes: pulmonary nodules Cardiovascular Medical History: Positive for: Hypertension Gastrointestinal Medical History: Past Medical History Notes: GERD IBS Genitourinary Medical History: Past Medical History Notes: UTI Musculoskeletal Medical History: Positive for: Osteoarthritis Past Medical History Notes: MS Neurologic Medical History: Positive for: Seizure Disorder Immunizations Pneumococcal Vaccine: Received Pneumococcal Vaccination: No Implantable Devices No devices added Hospitalization / Surgery History Type of Hospitalization/Surgery 06/2013 ORIF left toe bladder surgery 2007 appendectomy Family and Social History Cancer: Yes - Maternal Grandparents; Diabetes: Yes - Maternal Grandparents; Heart Disease: Yes - Father; Current every day smoker - 1ppd; Marital Status - Married; Alcohol Use: Never; Drug Use: No History; Caffeine Use: Never; Financial Concerns: No;  Food, Clothing or Shelter Needs: No; Support System Lacking: No; Transportation Concerns: No JULIYA, MAGILL ([id number]) [id number] Page 8 of 8 Electronic Signature(s) Signed: 10/25/2021 3:56:07 PM By: Kalman Shan DO Signed: 10/28/2021 6:08:05 PM By: Deon Pilling RN, BSN Entered By: Kalman Shan on 10/25/2021 14:38:53 -------------------------------------------------------------------------------- SuperBill Details Patient Name: Date of Service: [name], [name] 10/25/2021 Medical Record Number: [id number] Patient Account Number: [id number] Date of Birth/Sex: Treating RN: Sep 01, 1960 (61 y.o. [name] Primary Care Provider: A [facility], [place] Other Clinician: Referring Provider: Treating Provider/Extender: Britt Bolognese [facility], [place] Weeks in Treatment: 6 Diagnosis Coding ICD-10 Codes Code Description [phone] Non-pressure chronic ulcer of other part of right lower leg with fat layer exposed L97.822 Non-pressure chronic ulcer of other part of left lower leg with fat layer exposed I89.0 Lymphedema, not elsewhere classified I87.313 Chronic venous hypertension (idiopathic) with ulcer of bilateral lower extremity J44.9 Chronic obstructive pulmonary disease, unspecified G35 Multiple sclerosis Facility Procedures : CPT4 Code: 75449201 Description: 99213 - WOUND CARE VISIT-LEV 3 EST PT Modifier: 25 Quantity: 1 Physician Procedures :  CPT4 Code Description Modifier [id number] - WC PHYS LEVEL 3 - EST PT ICD-10 Diagnosis Description 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

## 2021-10-29 NOTE — Progress Notes (Signed)
[name], [name] ([id number]) [id number] Page 1 of 10 Visit Report for 10/25/2021 Arrival Information Details Patient Name: Date of Service: [name], [name] 10/25/2021 1:30 PM Medical Record Number: [id number] Patient Account Number: [id number] Date of Birth/Sex: Treating RN: 17-Mar-1960 (61 y.o. [name] Primary Care Sapna Padron: A [facility] [place] Other Clinician: Referring Avaiah Stempel: Treating Elishua Radford/Extender: Sharin Mons [facility] [place] Weeks in Treatment: 6 Visit Information History Since Last Visit Added or deleted any medications: No Patient Arrived: Wheel Chair Any new allergies or adverse reactions: No Arrival Time: 10:49 Had a fall or experienced change in No Accompanied By: husband activities of daily living that may affect Transfer Assistance: Manual risk of falls: Patient Identification Verified: Yes Signs or symptoms of abuse/neglect since last visito No Secondary Verification Process Completed: Yes Hospitalized since last visit: No Patient Requires Transmission-Based Precautions: No Implantable device outside of the clinic excluding No Patient Has Alerts: Yes cellular tissue based products placed in the center Patient Alerts: 5/23 ABI: L1.01 R 1.10 since last visit: 5/23 TBI: L0.75 R 0.90 Has Dressing in Place as Prescribed: Yes Pain Present Now: No Electronic Signature(s) Signed: 10/25/2021 1:50:52 PM By: Antonieta Iba Entered By: Antonieta Iba on 10/25/2021 13:55:55 -------------------------------------------------------------------------------- Clinic Level of Care Assessment Details Patient Name: Date of Service: [name], [name] 10/25/2021 1:30 PM Medical Record Number: [id number] Patient Account Number: [id number] Date of Birth/Sex: Treating RN: [birth year] (61 y.o. [name] Primary Care Riya Huxford: A [facility] [place] Other Clinician: Referring Bennette Hasty: Treating  Tamer Baughman/Extender: Sharin Mons [facility] [place] Weeks in Treatment: 6 Clinic Level of Care Assessment Items TOOL 4 Quantity Score X- 1 0 Use when only an EandM is performed on FOLLOW-UP visit ASSESSMENTS - Nursing Assessment / Reassessment []  - 0 Reassessment of Co-morbidities (includes updates in patient status) X- 1 5 Reassessment of Adherence to Treatment Plan ASSESSMENTS - Wound and Skin A ssessment / Reassessment X - Simple Wound Assessment / Reassessment - one wound 1 5 []  - 0 Complex Wound Assessment / Reassessment - multiple wounds []  - 0 Dermatologic / Skin Assessment (not related to wound area) ASSESSMENTS - Focused Assessment X- 2 5 Circumferential Edema Measurements - multi extremities []  - 0 Nutritional Assessment / Counseling / Intervention [name] ([id number]) [id number] Page 2 of 10 []  - 0 Lower Extremity Assessment (monofilament, tuning fork, pulses) []  - 0 Peripheral Arterial Disease Assessment (using hand held doppler) ASSESSMENTS - Ostomy and/or Continence Assessment and Care []  - 0 Incontinence Assessment and Management []  - 0 Ostomy Care Assessment and Management (repouching, etc.) PROCESS - Coordination of Care X - Simple Patient / Family Education for ongoing care 1 15 []  - 0 Complex (extensive) Patient / Family Education for ongoing care X- 1 10 Staff obtains Chiropractor, Records, T Results / Process Orders est []  - 0 Staff telephones HHA, Nursing Homes / Clarify orders / etc []  - 0 Routine Transfer to another Facility (non-emergent condition) []  - 0 Routine Hospital Admission (non-emergent condition) []  - 0 New Admissions / Manufacturing engineer / Ordering NPWT Apligraf, etc. , []  - 0 Emergency Hospital Admission (emergent condition) []  - 0 Simple Discharge Coordination []  - 0 Complex (extensive) Discharge Coordination PROCESS - Special Needs []  - 0 Pediatric / Minor Patient  Management []  - 0 Isolation Patient Management []  - 0 Hearing / Language / Visual special needs []  - 0 Assessment of Community assistance (transportation, D/C  planning, etc.) []  - 0 Additional assistance / Altered mentation []  - 0 Support Surface(s) Assessment (bed, cushion, seat, etc.) INTERVENTIONS - Wound Cleansing / Measurement []  - 0 Simple Wound Cleansing - one wound X- 2 5 Complex Wound Cleansing - multiple wounds X- 1 5 Wound Imaging (photographs - any number of wounds) []  - 0 Wound Tracing (instead of photographs) []  - 0 Simple Wound Measurement - one wound X- 2 5 Complex Wound Measurement - multiple wounds INTERVENTIONS - Wound Dressings []  - 0 Small Wound Dressing one or multiple wounds X- 2 15 Medium Wound Dressing one or multiple wounds []  - 0 Large Wound Dressing one or multiple wounds []  - 0 Application of Medications - topical []  - 0 Application of Medications - injection INTERVENTIONS - Miscellaneous []  - 0 External ear exam []  - 0 Specimen Collection (cultures, biopsies, blood, body fluids, etc.) []  - 0 Specimen(s) / Culture(s) sent or taken to Lab for analysis []  - 0 Patient Transfer (multiple staff / / Similar devices) []  - 0 Simple Staple / Suture removal (25 or less) []  - 0 Complex Staple / Suture removal (26 or more) []  - 0 Hypo / Hyperglycemic Management (close monitor of Blood Glucose) DEIONA, HOOPER ( ) [id number] Page 3 of 10 []  - 0 Ankle / Brachial Index (ABI) - do not check if billed separately X- 1 5 Vital Signs Has the patient been seen at the hospital within the last three years: Yes Total Score: 105 Level Of Care: New/Established - Level 3 Electronic Signature(s) Signed: 10/25/2021 1:44:39 PM By: RN, BSN Entered By: on 10/25/2021 16:34:28 -------------------------------------------------------------------------------- Encounter Discharge  Information Details Patient Name: Date of Service: [name], [name] 10/25/2021 1:30 PM Medical Record Number: Patient Account Number: Date of Birth/Sex: Treating RN: 05/21/1960 (61 y.o. Nurse, adult Primary Care Stacey Sago: A [facility] [place] Other Clinician: Referring Tahirah Sara: Treating Sherry Blackard/Extender: [facility] [place] Weeks in Treatment: 6 Encounter Discharge Information Items Discharge Condition: Stable Ambulatory Status: Wheelchair Discharge Destination: Home Transportation: Private Auto Accompanied By: husband Schedule Follow-up Appointment: Yes Clinical Summary of Care: Patient Declined Electronic Signature(s) Signed: 10/25/2021 1:44:39 PM By: RN, BSN Entered By: Dorina Hoyer on 10/25/2021 16:35:29 -------------------------------------------------------------------------------- Lower Extremity Assessment Details Patient Name: Date of Service: [name], [name] 10/25/2021 1:30 PM Medical Record Number: Patient Account Number: 10/27/2021 Date of Birth/Sex: Treating RN: 10-09-1960 (61 y.o. 10/27/2021 Primary Care Toini Failla: A [facility] [place] Other Clinician: Referring Jerrie Gullo: Treating Jermya Dowding/Extender: 10/27/2021 [facility] [place] Weeks in Treatment: 6 Edema Assessment Assessed: [Left: No] [Right: No] Edema: [Left: Yes] [Right: Yes] Calf Left: Right: Point of Measurement: 27 cm From Medial Instep 36 cm 39.2 cm Ankle Left: Right: Point of Measurement: 9 cm From Medial Instep 24 cm 26.4 cm Vascular Assessment GAIGE, FUSSNER ([id number]) [Right:[id number] Page 4 of 10] Pulses: Dorsalis Pedis Palpable: [Left:Yes] [Right:Yes] Electronic Signature(s) Signed: 10/25/2021 1:44:39 PM By: 62 RN, BSN Entered By: [name] on 10/25/2021  14:15:25 -------------------------------------------------------------------------------- Multi Wound Chart Details Patient Name: Date of Service: [name], [name] 10/25/2021 1:30 PM Medical Record Number: Redmond Pulling Patient Account Number: Redmond Pulling Date of Birth/Sex: Treating RN: 12/30/1960 (61 y.o. [id number] Primary Care Chriss Redel: A [facility] [place] Other Clinician: Referring Briany Aye: Treating Yobani Schertzer/Extender: [id number] [facility] [place] Weeks  in Treatment: 6 Vital Signs Height(in): 64 Pulse(bpm): 72 Weight(lbs): 135 Blood Pressure(mmHg): 146/78 Body Mass Index(BMI): 23.2 Temperature(F): 97.8 Respiratory Rate(breaths/min): 18 [1:Photos:] [N/A:N/A] Right, Circumferential Lower Leg Left, Circumferential Lower Leg N/A Wound Location: Gradually Appeared Gradually Appeared N/A Wounding Event: Lymphedema Lymphedema N/A Primary Etiology: Anemia, Lymphedema, Pneumothorax, Anemia, Lymphedema, Pneumothorax, N/A Comorbid History: Hypertension, Osteoarthritis, Seizure Hypertension, Osteoarthritis, Seizure Disorder Disorder 06/04/2021 06/04/2021 N/A Date Acquired: 6 6 N/A Weeks of Treatment: Open Open N/A Wound Status: No No N/A Wound Recurrence: Yes Yes N/A Clustered Wound: 2 4 N/A Clustered Quantity: 16.5x19x0.1 15x18x0.1 N/A Measurements L x W x D (cm) 246.222 212.058 N/A A (cm) : rea 24.622 21.206 N/A Volume (cm) : 64.50% -53.80% N/A % Reduction in Area: 64.50% 23.10% N/A % Reduction in Volume: Full Thickness Without Exposed Full Thickness Without Exposed N/A Classification: Support Structures Support Structures Medium Medium N/A Exudate Amount: Serosanguineous Serosanguineous N/A Exudate Type: red, brown red, brown N/A Exudate Color: Distinct, outline attached Distinct, outline attached N/A Wound Margin: Large (67-100%) Large (67-100%) N/A Granulation Amount: SAMIYAH, STUPKA ([id number])  [id number] Page 5 of 10 Pink, Pale N/A N/A Granulation Quality: None Present (0%) None Present (0%) N/A Necrotic Amount: Fat Layer (Subcutaneous Tissue): Yes Fat Layer (Subcutaneous Tissue): Yes N/A Exposed Structures: Fascia: No Fascia: No Tendon: No Tendon: No Muscle: No Muscle: No Joint: No Joint: No Bone: No Bone: No Medium (34-66%) Medium (34-66%) N/A Epithelialization: Excoriation: No Excoriation: No N/A Periwound Skin Texture: Induration: No Induration: No Callus: No Callus: No Crepitus: No Crepitus: No Rash: No Rash: No Scarring: No Scarring: No Maceration: No Maceration: No N/A Periwound Skin Moisture: Dry/Scaly: No Dry/Scaly: No Atrophie Blanche: No Atrophie Blanche: No N/A Periwound Skin Color: Cyanosis: No Cyanosis: No Ecchymosis: No Ecchymosis: No Erythema: No Erythema: No Hemosiderin Staining: No Hemosiderin Staining: No Mottled: No Mottled: No Pallor: No Pallor: No Rubor: No Rubor: No No Abnormality No Abnormality N/A Temperature: Treatment Notes Electronic Signature(s) Signed: 10/25/2021 3:56:07 PM By: Kalman Shan DO Signed: 10/28/2021 6:08:05 PM By: Deon Pilling RN, BSN Entered By: Kalman Shan on 10/25/2021 14:38:14 -------------------------------------------------------------------------------- Multi-Disciplinary Care Plan Details Patient Name: Date of Service: [name], [name] 10/25/2021 1:30 PM Medical Record Number: [id number] Patient Account Number: [id number] Date of Birth/Sex: Treating RN: February 19, 1960 (61 y.o. [name] Primary Care Tomisha Reppucci: A [facility] [place] Other Clinician: Referring Crystin Lechtenberg: Treating Lucie Friedlander/Extender: Britt Bolognese [facility] [place] Weeks in Treatment: 6 Active Inactive Venous Leg Ulcer Nursing Diagnoses: Knowledge deficit related to disease process and management Goals: Patient will maintain optimal edema control Date  Initiated: 09/07/2021 Target Resolution Date: 11/05/2021 Goal Status: Active Interventions: Assess peripheral edema status every visit. Compression as ordered Treatment Activities: Therapeutic compression applied : 09/07/2021 Notes: 09/20/21: Edema control ongoing, using 3 layer wrap. Wound/Skin Impairment Nursing Diagnoses: Knowledge deficit related to ulceration/compromised skin integrity TRIANA, COOVER ([id number]) [id number] Page 6 of 10 Goals: Patient/caregiver will verbalize understanding of skin care regimen Date Initiated: 09/07/2021 Target Resolution Date: 11/05/2021 Goal Status: Active Interventions: Assess patient/caregiver ability to perform ulcer/skin care regimen upon admission and as needed Assess ulceration(s) every visit Provide education on ulcer and skin care Treatment Activities: Skin care regimen initiated : 09/07/2021 Topical wound management initiated : 09/07/2021 Notes: 09/20/21: Wound care regimen continues. Electronic Signature(s) Signed: 10/25/2021 1:50:52 PM By: Lorrin Jackson Entered By: Lorrin Jackson on 10/25/2021 13:47:27 -------------------------------------------------------------------------------- Pain Assessment Details Patient Name: Date of Service: [name], [name] 10/25/2021 1:30 PM  Medical Record Number: [id number] Patient Account Number: [id number] Date of Birth/Sex: Treating RN: [birth year] (61 y.o. [name] Primary Care Jaremy Nosal: A [facility] [place] Other Clinician: Referring Wyn Nettle: Treating Leo Weyandt/Extender: Sharin Mons [facility] [place] Weeks in Treatment: 6 Active Problems Location of Pain Severity and Description of Pain Patient Has Paino Yes Site Locations Rate the pain. Current Pain Level: 4 Character of Pain Describe the Pain: Burning Pain Management and Medication Current Pain Management: Electronic Signature(s) Signed: 10/25/2021 1:44:39 PM By: Redmond Pulling RN, BSN Entered By: Redmond Pulling on 10/25/2021 14:13:30 Dorina Hoyer ([id number]) [id number] Page 7 of 10 -------------------------------------------------------------------------------- Patient/Caregiver Education Details Patient Name: Date of Service: [name], [name] 10/16/2023andnbsp1:30 PM Medical Record Number: [id number] Patient Account Number: [id number] Date of Birth/Gender: Treating RN: Jun 21, 1960 (61 y.o. [name] Primary Care Physician: A [facility] [place] Other Clinician: Referring Physician: Treating Physician/Extender: Sharin Mons [facility] [place] Weeks in Treatment: 6 Education Assessment Education Provided To: Patient Education Topics Provided Venous: Methods: Explain/Verbal, Printed Responses: State content correctly Wound/Skin Impairment: Methods: Explain/Verbal, Printed Responses: State content correctly Electronic Signature(s) Signed: 10/25/2021 1:50:52 PM By: Antonieta Iba Entered By: Antonieta Iba on 10/25/2021 13:48:54 -------------------------------------------------------------------------------- Wound Assessment Details Patient Name: Date of Service: [name], [name] 10/25/2021 1:30 PM Medical Record Number: [id number] Patient Account Number: [id number] Date of Birth/Sex: Treating RN: [birth year] (61 y.o. [name] Primary Care Nic Lampe: A [facility] [place] Other Clinician: Referring Andersyn Fragoso: Treating Estephan Gallardo/Extender: Sharin Mons [facility] [place] Weeks in Treatment: 6 Wound Status Wound Number: 1 Primary Lymphedema Etiology: Wound Location: Right, Circumferential Lower Leg Wound Open Wounding Event: Gradually Appeared Status: Date Acquired: 06/04/2021 Comorbid Anemia, Lymphedema, Pneumothorax, Hypertension, Weeks Of Treatment: 6 History: Osteoarthritis, Seizure Disorder Clustered Wound: Yes Photos KRISTAN, BRUMMITT ([id number])  [id number] Page 8 of 10 Wound Measurements Length: (cm) Width: (cm) Depth: (cm) Clustered Quantity: Area: (cm) Volume: (cm) 16.5 % Reduction in Area: 64.5% 19 % Reduction in Volume: 64.5% 0.1 Epithelialization: Medium (34-66%) 2 Tunneling: No 246.222 Undermining: No 24.622 Wound Description Classification: Full Thickness Without Exposed Supp Wound Margin: Distinct, outline attached Exudate Amount: Medium Exudate Type: Serosanguineous Exudate Color: red, brown ort Structures Foul Odor After Cleansing: No Slough/Fibrino No Wound Bed Granulation Amount: Large (67-100%) Exposed Structure Granulation Quality: Pink, Pale Fascia Exposed: No Necrotic Amount: None Present (0%) Fat Layer (Subcutaneous Tissue) Exposed: Yes Tendon Exposed: No Muscle Exposed: No Joint Exposed: No Bone Exposed: No Periwound Skin Texture Texture Color No Abnormalities Noted: No No Abnormalities Noted: No Callus: No Atrophie Blanche: No Crepitus: No Cyanosis: No Excoriation: No Ecchymosis: No Induration: No Erythema: No Rash: No Hemosiderin Staining: No Scarring: No Mottled: No Pallor: No Moisture Rubor: No No Abnormalities Noted: No Dry / Scaly: No Temperature / Pain Maceration: No Temperature: No Abnormality Treatment Notes Wound #1 (Lower Leg) Wound Laterality: Right, Circumferential Cleanser Soap and Water Discharge Instruction: May shower and wash wound with dial antibacterial soap and water prior to dressing change. Peri-Wound Care Topical Primary Dressing KerraCel Ag Gelling Fiber Dressing, 4x5 in (silver alginate) Discharge Instruction: Apply silver alginate to wound bed as instructed Secondary Dressing ABD Pad, 8x10 Discharge Instruction: Apply over primary dressing as directed. Woven Gauze Sponge, Non-Sterile 4x4 in Discharge Instruction: Apply over primary dressing as directed. Secured With [employer], 4.5x3.1 (in/yd) Discharge  Instruction: Secure with Kerlix as directed. 26M Medipore H Soft Cloth  Surgical T ape, 4 x 10 (in/yd) Discharge Instruction: Secure with tape as directed. Compression Wrap Compression Stockings Add-Ons Electronic Signature(s) Signed: 10/25/2021 1:40:46 PM By: Lorra Hals ([id number]) [id number] Page 9 of 10 Signed: 10/28/2021 6:08:05 PM By: [name] RN, BSN Entered By: Thayer Dallas on 10/25/2021 14:17:04 -------------------------------------------------------------------------------- Wound Assessment Details Patient Name: Date of Service: [name], [name] 10/25/2021 1:30 PM Medical Record Number: [id number] Patient Account Number: [id number] Date of Birth/Sex: Treating RN: Jun 13, 1960 (61 y.o. [name], [name] Primary Care Ipek Westra: A [facility] [place] Other Clinician: Referring Bracen Schum: Treating Casey Maxfield/Extender: Sharin Mons [facility] [place] Weeks in Treatment: 6 Wound Status Wound Number: 2 Primary Lymphedema Etiology: Wound Location: Left, Circumferential Lower Leg Wound Open Wounding Event: Gradually Appeared Status: Date Acquired: 06/04/2021 Comorbid Anemia, Lymphedema, Pneumothorax, Hypertension, Weeks Of Treatment: 6 History: Osteoarthritis, Seizure Disorder Clustered Wound: Yes Photos Wound Measurements Length: (cm) 1 Width: (cm) 1 Depth: (cm) 0 Clustered Quantity: 4 Area: (cm) Volume: (cm) 5 % Reduction in Area: -53.8% 8 % Reduction in Volume: 23.1% .1 Epithelialization: Medium (34-66%) Tunneling: No 212.058 Undermining: No 21.206 Wound Description Classification: Full Thickness Without Exposed Supp Wound Margin: Distinct, outline attached Exudate Amount: Medium Exudate Type: Serosanguineous Exudate Color: red, brown ort Structures Foul Odor After Cleansing: No Slough/Fibrino No Wound Bed Granulation Amount: Large (67-100%) Exposed Structure Necrotic Amount: None Present  (0%) Fascia Exposed: No Fat Layer (Subcutaneous Tissue) Exposed: Yes Tendon Exposed: No Muscle Exposed: No Joint Exposed: No Bone Exposed: No Periwound Skin Texture Texture Color No Abnormalities Noted: No No Abnormalities Noted: No Callus: No Atrophie Blanche: No Crepitus: No Cyanosis: No Excoriation: No Ecchymosis: No Induration: No Erythema: No Rash: No Hemosiderin Staining: No KAYELYNN, ABDOU ([id number]) [id number] Page 10 of 10 Scarring: No Mottled: No Pallor: No Moisture Rubor: No No Abnormalities Noted: No Dry / Scaly: No Temperature / Pain Maceration: No Temperature: No Abnormality Treatment Notes Wound #2 (Lower Leg) Wound Laterality: Left, Circumferential Cleanser Soap and Water Discharge Instruction: May shower and wash wound with dial antibacterial soap and water prior to dressing change. Peri-Wound Care Topical Primary Dressing KerraCel Ag Gelling Fiber Dressing, 4x5 in (silver alginate) Discharge Instruction: Apply silver alginate to wound bed as instructed Secondary Dressing ABD Pad, 8x10 Discharge Instruction: Apply over primary dressing as directed. Woven Gauze Sponge, Non-Sterile 4x4 in Discharge Instruction: Apply over primary dressing as directed. Secured With [employer], 4.5x3.1 (in/yd) Discharge Instruction: Secure with Kerlix as directed. 74M Medipore H Soft Cloth Surgical T ape, 4 x 10 (in/yd) Discharge Instruction: Secure with tape as directed. Compression Wrap Compression Stockings Add-Ons Electronic Signature(s) Signed: 10/25/2021 1:40:46 PM By: Thayer Dallas Signed: 10/28/2021 6:08:05 PM By: [name] RN, BSN Entered By: Thayer Dallas on 10/25/2021 14:18:27 -------------------------------------------------------------------------------- Vitals Details Patient Name: Date of Service: [name], [name] 10/25/2021 1:30 PM Medical Record Number: [id number] Patient Account Number:  [id number] Date of Birth/Sex: Treating RN: 01-Jun-1960 (61 y.o. [name] Primary Care Landynn Dupler: A [facility] [place] Other Clinician: Referring Jamus Loving: Treating Wen Merced/Extender: Sharin Mons [facility] [place] Weeks in Treatment: 6 Vital Signs Time Taken: 13:55 Temperature (F): 97.8 Height (in): 64 Pulse (bpm): 69 Weight (lbs): 135 Respiratory Rate (breaths/min): 18 Body Mass Index (BMI): 23.2 Blood Pressure (mmHg): 146/78 Reference Range: 80 - 120 mg / dl Electronic Signature(s) Signed: 10/25/2021 1:50:52 PM By: Antonieta Iba Entered By: Antonieta Iba on 10/25/2021 13:56:16

## 2021-11-01 ENCOUNTER — Ambulatory Visit (HOSPITAL_BASED_OUTPATIENT_CLINIC_OR_DEPARTMENT_OTHER): Payer: BC Managed Care – PPO | Admitting: Internal Medicine

## 2021-11-02 DIAGNOSIS — J432 Centrilobular emphysema: Secondary | ICD-10-CM | POA: Diagnosis not present

## 2021-11-02 DIAGNOSIS — I2510 Atherosclerotic heart disease of native coronary artery without angina pectoris: Secondary | ICD-10-CM | POA: Diagnosis not present

## 2021-11-02 DIAGNOSIS — G35 Multiple sclerosis: Secondary | ICD-10-CM | POA: Diagnosis not present

## 2021-11-02 DIAGNOSIS — R911 Solitary pulmonary nodule: Secondary | ICD-10-CM | POA: Diagnosis not present

## 2021-11-08 ENCOUNTER — Encounter (HOSPITAL_BASED_OUTPATIENT_CLINIC_OR_DEPARTMENT_OTHER): Payer: BC Managed Care – PPO | Attending: Internal Medicine | Admitting: Internal Medicine

## 2021-11-08 DIAGNOSIS — I890 Lymphedema, not elsewhere classified: Secondary | ICD-10-CM | POA: Diagnosis not present

## 2021-11-08 DIAGNOSIS — I87313 Chronic venous hypertension (idiopathic) with ulcer of bilateral lower extremity: Secondary | ICD-10-CM

## 2021-11-08 DIAGNOSIS — F17210 Nicotine dependence, cigarettes, uncomplicated: Secondary | ICD-10-CM | POA: Diagnosis not present

## 2021-11-08 DIAGNOSIS — L97812 Non-pressure chronic ulcer of other part of right lower leg with fat layer exposed: Secondary | ICD-10-CM | POA: Diagnosis not present

## 2021-11-08 DIAGNOSIS — L97822 Non-pressure chronic ulcer of other part of left lower leg with fat layer exposed: Secondary | ICD-10-CM | POA: Diagnosis not present

## 2021-11-08 DIAGNOSIS — J449 Chronic obstructive pulmonary disease, unspecified: Secondary | ICD-10-CM | POA: Insufficient documentation

## 2021-11-08 DIAGNOSIS — G35 Multiple sclerosis: Secondary | ICD-10-CM | POA: Insufficient documentation

## 2021-11-12 NOTE — Progress Notes (Signed)
[name], [name] ([id number]) [id number] Page 1 of 8 Visit Report for 11/08/2021 Chief Complaint Document Details Patient Name: Date of Service: [place] Rayne Du, [name] 11/08/2021 1:45 PM Medical Record Number: [id number] Patient Account Number: [id number] Date of Birth/Sex: Treating RN: Jun 16, 1960 (61 y.o. F) Primary Care Provider: A [facility], [place] Other Clinician: Referring Provider: Treating Provider/Extender: Britt Bolognese [facility], [place] Nancy Weeks in Treatment: 8 Information Obtained from: Patient Chief Complaint 09/07/2021; bilateral lower extremity wounds Electronic Signature(s) Signed: 11/12/2021 1:40:10 PM By: Kalman Shan DO Entered By: Kalman Shan on 11/08/2021 14:36:10 -------------------------------------------------------------------------------- HPI Details Patient Name: Date of Service: [name], Nancy [name] 11/08/2021 1:45 PM Medical Record Number: [id number] Patient Account Number: [id number] Date of Birth/Sex: Treating RN: 1960/08/20 (61 y.o. F) Primary Care Provider: A [facility], [place] Nancy Other Clinician: Referring Provider: Treating Provider/Extender: Britt Bolognese [facility], [place] Nancy Weeks in Treatment: 8 History of Present Illness HPI Description: Admission 09/07/2021 Ms. [name] Spearing is a 61 year old female with a past medical history of COPD, multiple sclerosis and lymphedema that presents the clinic for a 55-monthhistory of nonhealing ulcers to her lower extremities bilaterally. She was recently seen in the ED on 8/11 for left lower cellulitis and given IV antibiotics and discharged on oral Augmentin. She completed this on 8/19. Patient does not wear compression stockings. She does not have a history of wounds previously. She is not on a diuretic. 9/7; patient presents for follow-up. She states that she needed to take the wraps off yesterday due to drainage coming through. We have been using  silver alginate to the right lower extremity wound and gentamicin and mupirocin ointment with calcium alginate to the left lower extremity wound all under Kerlix/Coban. She currently denies signs of infection. 9/11; patient presents for follow-up. She was able to keep the wraps in place. We have been using silver alginate to the right lower extremity and silver alginate and Iodoflex to the left lower extremity all under 3 layer compression. She notes that the Iodoflex caused her irritation. She currently denies signs of infection. 9/18; patient presents for follow-up. We have been using silver alginate to the right lower extremity and antibiotic ointment with calcium alginate to the left lower extremity all under 3 layer compression. She states that the wraps felt tight and had to take them off last night. Other than that she has [name] issues or complaints today. 9/25; patient presents for follow-up. We have been using silver alginate and with antibiotic ointment to the lower extremities bilaterally with compression. Patient has tolerated this well. She has [name] issues or complaints today. 10/2; patient presents for follow-up. We have been using silver alginate and antibiotic ointment under compression therapy. She states that the wrap was uncomfortable and took it off last night. She has significant swelling on exam. She is not on a diuretic. 10/9; patient presents for follow-up. We have been using silver alginate under 3 layer compression bilaterally to the lower extremities. She took the wraps off yesterday due to discomfort. She would like to try her juxta lite compression instead of in office wraps. She denies signs of infection. She states she was recently started on a very low-dose of a diuretic. 10/16; patient presents for follow-up. She declined compression wraps at last clinic visit and used her juxta lite compression Velcro garments instead. She states she did well with this. She is receiving  her lymphedema pumps tomorrow. 10/30; patient presents for  follow up. She declined compression wraps today. She states she wants to use her Velcro compression garments. She is [name], [name] ([id number]) [id number] Page 2 of 8 complaining of more swelling. She states she uses her lymphedema pumps but not daily. She stopped her diuretic pill on her own. She complained about urinating too frequently. Electronic Signature(s) Signed: 11/12/2021 1:40:10 PM By: Kalman Shan DO Entered By: Kalman Shan on 11/08/2021 14:39:33 -------------------------------------------------------------------------------- Physical Exam Details Patient Name: Date of Service: [name], Nancy [name] 11/08/2021 1:45 PM Medical Record Number: [id number] Patient Account Number: [id number] Date of Birth/Sex: Treating RN: [birth year] (61 y.o. F) Primary Care Provider: A [facility], [place] Nancy Other Clinician: Referring Provider: Treating Provider/Extender: Kalman Shan A [facility], [place] Nancy Weeks in Treatment: 8 Constitutional respirations regular, non-labored and within target range for patient.. Cardiovascular 2+ dorsalis pedis/posterior tibialis pulses. Psychiatric pleasant and cooperative. Notes Right lower extremity: 3+ pitting edema to the thigh. papilomatosis cutis lymphostatica. Scattered Open wounds with weeping T the lower extremity. [name] o increased warmth or purulent drainage noted. Left lower extremity: Scattered open wounds with granulation tissue. serous drainage. 3+ pitting edema to the thigh. [name] increased warmth or purulent drainage noted. Venous stasis dermatitis. Electronic Signature(s) Signed: 11/12/2021 1:40:10 PM By: Kalman Shan DO Entered By: Kalman Shan on 11/08/2021 14:41:31 -------------------------------------------------------------------------------- Physician Orders Details Patient Name: Date of Service: [name], Nancy [name]  11/08/2021 1:45 PM Medical Record Number: [id number] Patient Account Number: [id number] Date of Birth/Sex: Treating RN: 1960/09/29 (61 y.o. Benjaman Lobe Primary Care Provider: A [facility], [place] Nancy Other Clinician: Referring Provider: Treating Provider/Extender: Britt Bolognese [facility], [place] Nancy Weeks in Treatment: 8 Verbal / Phone Orders: [name] Diagnosis Coding Follow-up Appointments ppointment in 2 weeks. - Dr. Heber [place] Return A Other: - Kyung Rudd will send your wound care supplies. Bathing/ Shower/ Hygiene May shower and wash wound with soap and water. Edema Control - Lymphedema / SCD / Other Lymphedema Pumps. Use Lymphedema pumps on leg(s) 2-3 times a day for 45-60 minutes. If wearing any wraps or hose, do not remove them. Continue exercising as instructed. - start with 2 times a day throughout the day. Elevate legs to the level of the heart or above for 30 minutes daily and/or when sitting, a frequency of: - 3-4 times a day throughout the day. Avoid standing for long periods of time. Exercise regularly [name], [name] ([id number]) [id number] Page 3 of 8 Compression stocking or Garment 30-40 mm/Hg pressure to: - Apply in the morning and remove at night. Wound Treatment Wound #1 - Lower Leg Wound Laterality: Right, Circumferential Cleanser: Soap and Water 1 x Per Day/30 Days Discharge Instructions: May shower and wash wound with dial antibacterial soap and water prior to dressing change. Prim Dressing: KerraCel Ag Gelling Fiber Dressing, 4x5 in (silver alginate) (DME) (Generic) 1 x Per Day/30 Days ary Discharge Instructions: Apply silver alginate to wound bed as instructed Secondary Dressing: ABD Pad, 8x10 (DME) (Generic) 1 x Per Day/30 Days Discharge Instructions: Apply over primary dressing as directed. Secondary Dressing: Woven Gauze Sponge, Non-Sterile 4x4 in (DME) (Generic) 1 x Per Day/30 Days Discharge Instructions: Apply over  primary dressing as directed. Secured With: [employer], 4.5x3.1 (in/yd) (DME) (Generic) 1 x Per Day/30 Days Discharge Instructions: Secure with Kerlix as directed. Secured With: 1M Medipore H Soft Cloth Surgical T ape, 4 x 10 (in/yd) (DME) (Generic) 1 x Per Day/30 Days Discharge Instructions: Secure with tape as  directed. Wound #2 - Lower Leg Wound Laterality: Left, Circumferential Cleanser: Soap and Water 1 x Per Day/30 Days Discharge Instructions: May shower and wash wound with dial antibacterial soap and water prior to dressing change. Prim Dressing: KerraCel Ag Gelling Fiber Dressing, 4x5 in (silver alginate) (DME) (Generic) 1 x Per Day/30 Days ary Discharge Instructions: Apply silver alginate to wound bed as instructed Secondary Dressing: ABD Pad, 8x10 (DME) (Generic) 1 x Per Day/30 Days Discharge Instructions: Apply over primary dressing as directed. Secondary Dressing: Woven Gauze Sponge, Non-Sterile 4x4 in (DME) (Generic) 1 x Per Day/30 Days Discharge Instructions: Apply over primary dressing as directed. Secured With: [employer], 4.5x3.1 (in/yd) (DME) (Generic) 1 x Per Day/30 Days Discharge Instructions: Secure with Kerlix as directed. Secured With: 87M Medipore H Soft Cloth Surgical T ape, 4 x 10 (in/yd) (DME) (Generic) 1 x Per Day/30 Days Discharge Instructions: Secure with tape as directed. Electronic Signature(s) Signed: 11/12/2021 1:40:10 PM By: Kalman Shan DO Entered By: Kalman Shan on 11/08/2021 14:41:40 -------------------------------------------------------------------------------- Problem List Details Patient Name: Date of Service: [name], Nancy [name] 11/08/2021 1:45 PM Medical Record Number: [id number] Patient Account Number: [id number] Date of Birth/Sex: Treating RN: Feb 04, 1960 (61 y.o. 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/07/2021 [name] Yes exposed L97.822 Non-pressure chronic ulcer of other part of left lower leg with fat layer exposed8/29/2023 [name] Yes [name], [name] ([id number]) [id number] Page 4 of 8 I89.0 Lymphedema, not elsewhere classified 09/07/2021 [name] Yes I87.313 Chronic venous hypertension (idiopathic) with ulcer of bilateral lower extremity 09/07/2021 [name] Yes J44.9 Chronic obstructive pulmonary disease, unspecified 09/07/2021 [name] Yes G35 Multiple sclerosis 09/07/2021 [name] Yes Inactive Problems Resolved Problems Electronic Signature(s) Signed: 11/12/2021 1:40:10 PM By: Kalman Shan DO Entered By: Kalman Shan on 11/08/2021 14:35:02 -------------------------------------------------------------------------------- Progress Note Details Patient Name: Date of Service: [name], Nancy [name] 11/08/2021 1:45 PM Medical Record Number: [id number] Patient Account Number: [id number] Date of Birth/Sex: Treating RN: [birth year] (61 y.o. F) Primary Care Provider: A [facility], [place] Other Clinician: Referring Provider: Treating Provider/Extender: Britt Bolognese [facility], [place] Nancy Weeks in Treatment: 8 Subjective Chief Complaint Information obtained from Patient 09/07/2021; bilateral lower extremity wounds History of Present Illness (HPI) Admission 09/07/2021 Ms. [name] Merkle is a 61 year old female with a past medical history of COPD, multiple sclerosis and lymphedema that presents the clinic for a 12-monthhistory of nonhealing ulcers to her lower extremities bilaterally. She was recently seen in the ED on 8/11 for left lower cellulitis and given IV antibiotics and discharged on oral Augmentin. She completed this on 8/19. Patient does not wear compression stockings. She does  not have a history of wounds previously. She is not on a diuretic. 9/7; patient presents for follow-up. She states that she needed to take the wraps off yesterday due to drainage coming through. We have been using silver alginate to the right lower extremity wound and gentamicin and mupirocin ointment with calcium alginate to the left lower extremity wound all under Kerlix/Coban. She currently denies signs of infection. 9/11; patient presents for follow-up. She was able to keep the wraps in place. We have been using silver alginate to the right lower extremity and silver alginate and Iodoflex to  the left lower extremity all under 3 layer compression. She notes that the Iodoflex caused her irritation. She currently denies signs of infection. 9/18; patient presents for follow-up. We have been using silver alginate to the right lower extremity and antibiotic ointment with calcium alginate to the left lower extremity all under 3 layer compression. She states that the wraps felt tight and had to take them off last night. Other than that she has [name] issues or complaints today. 9/25; patient presents for follow-up. We have been using silver alginate and with antibiotic ointment to the lower extremities bilaterally with compression. Patient has tolerated this well. She has [name] issues or complaints today. 10/2; patient presents for follow-up. We have been using silver alginate and antibiotic ointment under compression therapy. She states that the wrap was uncomfortable and took it off last night. She has significant swelling on exam. She is not on a diuretic. 10/9; patient presents for follow-up. We have been using silver alginate under 3 layer compression bilaterally to the lower extremities. She took the wraps off yesterday due to discomfort. She would like to try her juxta lite compression instead of in office wraps. She denies signs of infection. She states she was recently started on a very low-dose of a  diuretic. 10/16; patient presents for follow-up. She declined compression wraps at last clinic visit and used her juxta lite compression Velcro garments instead. She [name], [name] ([id number]) [id number] Page 5 of 8 states she did well with this. She is receiving her lymphedema pumps tomorrow. 10/30; patient presents for follow up. She declined compression wraps today. She states she wants to use her Velcro compression garments. She is complaining of more swelling. She states she uses her lymphedema pumps but not daily. She stopped her diuretic pill on her own. She complained about urinating too frequently. Patient History Information obtained from Patient, Chart. Family History Cancer - Maternal Grandparents, Diabetes - Maternal Grandparents, Heart Disease - Father. Social History Current every day smoker - 1ppd, Marital Status - Married, Alcohol Use - Never, Drug Use - [name] History, Caffeine Use - Never. Medical History Hematologic/Lymphatic Patient has history of Anemia, Lymphedema Respiratory Patient has history of Pneumothorax - bilateral Cardiovascular Patient has history of Hypertension Musculoskeletal Patient has history of Osteoarthritis Neurologic Patient has history of Seizure Disorder Hospitalization/Surgery History - 06/2013 ORIF left toe. - bladder surgery 2007. - appendectomy. Medical A Surgical History Notes nd Constitutional Symptoms (General Health) HLA B27 positive Respiratory pulmonary nodules Gastrointestinal GERD IBS Genitourinary UTI Musculoskeletal MS Objective Constitutional respirations regular, non-labored and within target range for patient.. Vitals Time Taken: 2:00 PM, Height: 64 in, Weight: 135 lbs, BMI: 23.2, Temperature: 97.9 F, Pulse: 83 bpm, Respiratory Rate: 16 breaths/min, Blood Pressure: 135/79 mmHg. Cardiovascular 2+ dorsalis pedis/posterior tibialis pulses. Psychiatric pleasant and cooperative. General  Notes: Right lower extremity: 3+ pitting edema to the thigh. papilomatosis cutis lymphostatica. Scattered Open wounds with weeping T the lower o extremity. [name] increased warmth or purulent drainage noted. Left lower extremity: Scattered open wounds with granulation tissue. serous drainage. 3+ pitting edema to the thigh. [name] increased warmth or purulent drainage noted. Venous stasis dermatitis. Integumentary (Hair, Skin) Wound #1 status is Open. Original cause of wound was Gradually Appeared. The date acquired was: 06/04/2021. The wound has been in treatment 8 weeks. The wound is located on the Right,Circumferential Lower Leg. The wound measures 11cm length x 19cm width x 0.1cm depth; 164.148cm^2 area and 16.415cm^3 volume. There is Fat Layer (Subcutaneous Tissue) exposed. There  is [name] tunneling or undermining noted. There is a medium amount of serosanguineous drainage noted. The wound margin is distinct with the outline attached to the wound base. There is large (67-100%) pink, pale granulation within the wound bed. There is [name] necrotic tissue within the wound bed. The periwound skin appearance did not exhibit: Callus, Crepitus, Excoriation, Induration, Rash, Scarring, Dry/Scaly, Maceration, Atrophie Blanche, Cyanosis, Ecchymosis, Hemosiderin Staining, Mottled, Pallor, Rubor, Erythema. Periwound temperature was noted as [name] Abnormality. Wound #2 status is Open. Original cause of wound was Gradually Appeared. The date acquired was: 06/04/2021. The wound has been in treatment 8 weeks. The wound is located on the Left,Circumferential Lower Leg. The wound measures 12.5cm length x 23cm width x 0.1cm depth; 225.802cm^2 area and 22.58cm^3 volume. There is Fat Layer (Subcutaneous Tissue) exposed. There is [name] tunneling or undermining noted. There is a medium amount of serosanguineous drainage noted. The wound margin is distinct with the outline attached to the wound base. There is large (67-100%) granulation within  the wound bed. There is a small (1-33%) amount of necrotic tissue within the wound bed including Adherent Slough. The periwound skin appearance did not exhibit: Callus, Crepitus, Excoriation, Induration, Rash, Scarring, Dry/Scaly, Maceration, Atrophie Blanche, Cyanosis, Ecchymosis, Hemosiderin Staining, Mottled, Pallor, Rubor, Erythema. Periwound temperature was noted as [name] Abnormality. [name], [name] ([id number]) [id number] Page 6 of 8 Assessment Active Problems ICD-10 Non-pressure chronic ulcer of other part of right lower leg with fat layer exposed Non-pressure chronic ulcer of other part of left lower leg with fat layer exposed Lymphedema, not elsewhere classified Chronic venous hypertension (idiopathic) with ulcer of bilateral lower extremity Chronic obstructive pulmonary disease, unspecified Multiple sclerosis Patient has more swelling on exam and weeping. I recommended compression wraps however patient is very adamant about not having these placed. She states she will use her Velcro compression wraps. I recommended she talk with her primary care physician about her diuretic use. She seems adamant in stopping this as well. We discussed how her wounds will likely not heal without aggressive compression therapy and diuretic use. Plan Follow-up Appointments: Return Appointment in 2 weeks. - Dr. Heber  Other: Kyung Rudd will send your wound care supplies. Bathing/ Shower/ Hygiene: May shower and wash wound with soap and water. Edema Control - Lymphedema / SCD / Other: Lymphedema Pumps. Use Lymphedema pumps on leg(s) 2-3 times a day for 45-60 minutes. If wearing any wraps or hose, do not remove them. Continue exercising as instructed. - start with 2 times a day throughout the day. Elevate legs to the level of the heart or above for 30 minutes daily and/or when sitting, a frequency of: - 3-4 times a day throughout the day. Avoid standing for long periods of  time. Exercise regularly Compression stocking or Garment 30-40 mm/Hg pressure to: - Apply in the morning and remove at night. WOUND #1: - Lower Leg Wound Laterality: Right, Circumferential Cleanser: Soap and Water 1 x Per Day/30 Days Discharge Instructions: May shower and wash wound with dial antibacterial soap and water prior to dressing change. Prim Dressing: KerraCel Ag Gelling Fiber Dressing, 4x5 in (silver alginate) (DME) (Generic) 1 x Per Day/30 Days ary Discharge Instructions: Apply silver alginate to wound bed as instructed Secondary Dressing: ABD Pad, 8x10 (DME) (Generic) 1 x Per Day/30 Days Discharge Instructions: Apply over primary dressing as directed. Secondary Dressing: Woven Gauze Sponge, Non-Sterile 4x4 in (DME) (Generic) 1 x Per Day/30 Days Discharge Instructions: Apply over primary dressing as directed. Secured With: [employer], 4.5x3.1 (  in/yd) (DME) (Generic) 1 x Per Day/30 Days Discharge Instructions: Secure with Kerlix as directed. Secured With: 32M Medipore H Soft Cloth Surgical T ape, 4 x 10 (in/yd) (DME) (Generic) 1 x Per Day/30 Days Discharge Instructions: Secure with tape as directed. WOUND #2: - Lower Leg Wound Laterality: Left, Circumferential Cleanser: Soap and Water 1 x Per Day/30 Days Discharge Instructions: May shower and wash wound with dial antibacterial soap and water prior to dressing change. Prim Dressing: KerraCel Ag Gelling Fiber Dressing, 4x5 in (silver alginate) (DME) (Generic) 1 x Per Day/30 Days ary Discharge Instructions: Apply silver alginate to wound bed as instructed Secondary Dressing: ABD Pad, 8x10 (DME) (Generic) 1 x Per Day/30 Days Discharge Instructions: Apply over primary dressing as directed. Secondary Dressing: Woven Gauze Sponge, Non-Sterile 4x4 in (DME) (Generic) 1 x Per Day/30 Days Discharge Instructions: Apply over primary dressing as directed. Secured With: [employer], 4.5x3.1 (in/yd) (DME) (Generic) 1 x Per  Day/30 Days Discharge Instructions: Secure with Kerlix as directed. Secured With: 32M Medipore H Soft Cloth Surgical T ape, 4 x 10 (in/yd) (DME) (Generic) 1 x Per Day/30 Days Discharge Instructions: Secure with tape as directed. 1. Order wound care supplies 2. Silver alginate under compression garments to the lower extremities bilaterally 3. Follow-up in 2 weeks Electronic Signature(s) Signed: 11/12/2021 1:40:10 PM By: Kalman Shan DO Entered By: Kalman Shan on 11/08/2021 14:43:43 HxROS Details -------------------------------------------------------------------------------- [name] ([id number]) [id number] Page 7 of 8 Patient Name: Date of Service: [name], [name] 11/08/2021 1:45 PM Medical Record Number: [id number] Patient Account Number: [id number] Date of Birth/Sex: Treating RN: May 29, 1960 (61 y.o. F) Primary Care Provider: A [facility], [place] Nancy Other Clinician: Referring Provider: Treating Provider/Extender: Kalman Shan A [facility], [place] Nancy Weeks in Treatment: 8 Information Obtained From Patient Chart Constitutional Symptoms (General Health) Medical History: Past Medical History Notes: HLA B27 positive Hematologic/Lymphatic Medical History: Positive for: Anemia; Lymphedema Respiratory Medical History: Positive for: Pneumothorax - bilateral Past Medical History Notes: pulmonary nodules Cardiovascular Medical History: Positive for: Hypertension Gastrointestinal Medical History: Past Medical History Notes: GERD IBS Genitourinary Medical History: Past Medical History Notes: UTI Musculoskeletal Medical History: Positive for: Osteoarthritis Past Medical History Notes: MS Neurologic Medical History: Positive for: Seizure Disorder Immunizations Pneumococcal Vaccine: Received Pneumococcal Vaccination: [name] Implantable Devices [name] devices added Hospitalization / Surgery History Type of  Hospitalization/Surgery 06/2013 ORIF left toe bladder surgery 2007 appendectomy Family and Social History Cancer: Yes - Maternal Grandparents; Diabetes: Yes - Maternal Grandparents; Heart Disease: Yes - Father; Current every day smoker - 1ppd; Marital Status - Married; Alcohol Use: Never; Drug Use: [name] History; Caffeine Use: Never; Financial Concerns: [name]; Food, Clothing or Shelter Needs: [name]; Support System Lacking: [name]; Transportation Concerns: [name] [name], [name] ([id number]) [id number] Page 8 of 8 Electronic Signature(s) Signed: 11/12/2021 1:40:10 PM By: Kalman Shan DO Entered By: Kalman Shan on 11/08/2021 14:39:38 -------------------------------------------------------------------------------- SuperBill Details Patient Name: Date of Service: [name], Nancy [name] 11/08/2021 Medical Record Number: [id number] Patient Account Number: [id number] Date of Birth/Sex: Treating RN: [birth year] (61 y.o. F) Primary Care Provider: A [facility] [place] Nancy Other Clinician: Referring Provider: Treating Provider/Extender: Britt Bolognese [facility] [place] Nancy Weeks in Treatment: 8 Diagnosis Coding ICD-10 Codes Code Description [phone] Non-pressure chronic ulcer of other part of right lower leg with fat layer exposed L97.822 Non-pressure chronic ulcer of other part of left lower leg with fat layer exposed I89.0 Lymphedema, not elsewhere classified I87.313 Chronic venous  hypertension (idiopathic) with ulcer of bilateral lower extremity J44.9 Chronic obstructive pulmonary disease, unspecified G35 Multiple sclerosis Facility Procedures : CPT4 Code: [id number] Description: 61950 - WOUND CARE VISIT-LEV 5 EST PT Modifier: Quantity: 1 Physician Procedures : CPT4 Code Description Modifier [id number] - WC PHYS LEVEL 3 - EST PT ICD-10 Diagnosis Description W58.099 Non-pressure chronic ulcer of other part of right lower leg with fat layer exposed L97.822  Non-pressure chronic ulcer of other part of left  lower leg with fat layer exposed I89.0 Lymphedema, not elsewhere classified I87.313 Chronic venous hypertension (idiopathic) with ulcer of bilateral lower extremity Quantity: 1 Electronic Signature(s) Signed: 11/09/2021 10:11:32 AM By: Deon Pilling RN, BSN Signed: 11/12/2021 1:40:10 PM By: Kalman Shan DO Entered By: Deon Pilling on 11/09/2021 10:11:32

## 2021-11-13 DIAGNOSIS — I890 Lymphedema, not elsewhere classified: Secondary | ICD-10-CM | POA: Diagnosis not present

## 2021-11-15 DIAGNOSIS — S81809A Unspecified open wound, unspecified lower leg, initial encounter: Secondary | ICD-10-CM | POA: Diagnosis not present

## 2021-11-16 DIAGNOSIS — R918 Other nonspecific abnormal finding of lung field: Secondary | ICD-10-CM | POA: Diagnosis not present

## 2021-11-16 DIAGNOSIS — R911 Solitary pulmonary nodule: Secondary | ICD-10-CM | POA: Diagnosis not present

## 2021-11-19 DIAGNOSIS — R1310 Dysphagia, unspecified: Secondary | ICD-10-CM | POA: Diagnosis not present

## 2021-11-19 DIAGNOSIS — A419 Sepsis, unspecified organism: Secondary | ICD-10-CM | POA: Diagnosis not present

## 2021-11-19 DIAGNOSIS — J9601 Acute respiratory failure with hypoxia: Secondary | ICD-10-CM | POA: Diagnosis not present

## 2021-11-19 DIAGNOSIS — R5381 Other malaise: Secondary | ICD-10-CM | POA: Diagnosis not present

## 2021-11-22 ENCOUNTER — Encounter (HOSPITAL_BASED_OUTPATIENT_CLINIC_OR_DEPARTMENT_OTHER): Payer: BC Managed Care – PPO | Admitting: Internal Medicine

## 2021-11-28 DIAGNOSIS — S93401A Sprain of unspecified ligament of right ankle, initial encounter: Secondary | ICD-10-CM | POA: Diagnosis not present

## 2021-11-28 DIAGNOSIS — M25571 Pain in right ankle and joints of right foot: Secondary | ICD-10-CM | POA: Diagnosis not present

## 2021-12-13 DIAGNOSIS — S80919A Unspecified superficial injury of unspecified knee, initial encounter: Secondary | ICD-10-CM | POA: Diagnosis not present

## 2021-12-13 DIAGNOSIS — N179 Acute kidney failure, unspecified: Secondary | ICD-10-CM | POA: Diagnosis not present

## 2021-12-13 DIAGNOSIS — G40909 Epilepsy, unspecified, not intractable, without status epilepticus: Secondary | ICD-10-CM | POA: Diagnosis not present

## 2021-12-13 DIAGNOSIS — W19XXXA Unspecified fall, initial encounter: Secondary | ICD-10-CM | POA: Diagnosis not present

## 2021-12-13 DIAGNOSIS — N3000 Acute cystitis without hematuria: Secondary | ICD-10-CM | POA: Diagnosis not present

## 2021-12-13 DIAGNOSIS — I708 Atherosclerosis of other arteries: Secondary | ICD-10-CM | POA: Diagnosis not present

## 2021-12-13 DIAGNOSIS — R600 Localized edema: Secondary | ICD-10-CM | POA: Diagnosis not present

## 2021-12-13 DIAGNOSIS — Z043 Encounter for examination and observation following other accident: Secondary | ICD-10-CM | POA: Diagnosis not present

## 2021-12-13 DIAGNOSIS — J9601 Acute respiratory failure with hypoxia: Secondary | ICD-10-CM | POA: Diagnosis not present

## 2021-12-13 DIAGNOSIS — Z888 Allergy status to other drugs, medicaments and biological substances status: Secondary | ICD-10-CM | POA: Diagnosis not present

## 2021-12-13 DIAGNOSIS — N182 Chronic kidney disease, stage 2 (mild): Secondary | ICD-10-CM | POA: Diagnosis not present

## 2021-12-13 DIAGNOSIS — R5381 Other malaise: Secondary | ICD-10-CM | POA: Diagnosis not present

## 2021-12-13 DIAGNOSIS — K828 Other specified diseases of gallbladder: Secondary | ICD-10-CM | POA: Diagnosis not present

## 2021-12-13 DIAGNOSIS — G35 Multiple sclerosis: Secondary | ICD-10-CM | POA: Diagnosis not present

## 2021-12-13 DIAGNOSIS — N390 Urinary tract infection, site not specified: Secondary | ICD-10-CM | POA: Diagnosis not present

## 2021-12-13 DIAGNOSIS — I878 Other specified disorders of veins: Secondary | ICD-10-CM | POA: Diagnosis not present

## 2021-12-13 DIAGNOSIS — L03115 Cellulitis of right lower limb: Secondary | ICD-10-CM | POA: Diagnosis not present

## 2021-12-13 DIAGNOSIS — I959 Hypotension, unspecified: Secondary | ICD-10-CM | POA: Diagnosis not present

## 2021-12-13 DIAGNOSIS — L97829 Non-pressure chronic ulcer of other part of left lower leg with unspecified severity: Secondary | ICD-10-CM | POA: Diagnosis not present

## 2021-12-13 DIAGNOSIS — S93401A Sprain of unspecified ligament of right ankle, initial encounter: Secondary | ICD-10-CM | POA: Diagnosis not present

## 2021-12-13 DIAGNOSIS — Z885 Allergy status to narcotic agent status: Secondary | ICD-10-CM | POA: Diagnosis not present

## 2021-12-13 DIAGNOSIS — G629 Polyneuropathy, unspecified: Secondary | ICD-10-CM | POA: Diagnosis not present

## 2021-12-13 DIAGNOSIS — L97819 Non-pressure chronic ulcer of other part of right lower leg with unspecified severity: Secondary | ICD-10-CM | POA: Diagnosis not present

## 2021-12-13 DIAGNOSIS — L03116 Cellulitis of left lower limb: Secondary | ICD-10-CM | POA: Diagnosis not present

## 2021-12-13 DIAGNOSIS — N178 Other acute kidney failure: Secondary | ICD-10-CM | POA: Diagnosis not present

## 2021-12-13 DIAGNOSIS — R0689 Other abnormalities of breathing: Secondary | ICD-10-CM | POA: Diagnosis not present

## 2021-12-13 DIAGNOSIS — R112 Nausea with vomiting, unspecified: Secondary | ICD-10-CM | POA: Diagnosis not present

## 2021-12-13 DIAGNOSIS — A419 Sepsis, unspecified organism: Secondary | ICD-10-CM | POA: Diagnosis not present

## 2021-12-13 DIAGNOSIS — E440 Moderate protein-calorie malnutrition: Secondary | ICD-10-CM | POA: Diagnosis not present

## 2021-12-13 DIAGNOSIS — Z9109 Other allergy status, other than to drugs and biological substances: Secondary | ICD-10-CM | POA: Diagnosis not present

## 2021-12-13 DIAGNOSIS — R1310 Dysphagia, unspecified: Secondary | ICD-10-CM | POA: Diagnosis not present

## 2021-12-13 DIAGNOSIS — N281 Cyst of kidney, acquired: Secondary | ICD-10-CM | POA: Diagnosis not present

## 2021-12-13 DIAGNOSIS — M79673 Pain in unspecified foot: Secondary | ICD-10-CM | POA: Diagnosis not present

## 2021-12-13 DIAGNOSIS — I890 Lymphedema, not elsewhere classified: Secondary | ICD-10-CM | POA: Diagnosis not present

## 2021-12-13 DIAGNOSIS — Z91013 Allergy to seafood: Secondary | ICD-10-CM | POA: Diagnosis not present

## 2021-12-13 DIAGNOSIS — M25562 Pain in left knee: Secondary | ICD-10-CM | POA: Diagnosis not present

## 2022-01-01 DIAGNOSIS — E860 Dehydration: Secondary | ICD-10-CM | POA: Diagnosis not present

## 2022-01-01 DIAGNOSIS — I517 Cardiomegaly: Secondary | ICD-10-CM | POA: Diagnosis not present

## 2022-01-01 DIAGNOSIS — R079 Chest pain, unspecified: Secondary | ICD-10-CM | POA: Diagnosis not present

## 2022-01-01 DIAGNOSIS — K6389 Other specified diseases of intestine: Secondary | ICD-10-CM | POA: Diagnosis not present

## 2022-01-01 DIAGNOSIS — R110 Nausea: Secondary | ICD-10-CM | POA: Diagnosis not present

## 2022-01-01 DIAGNOSIS — R531 Weakness: Secondary | ICD-10-CM | POA: Diagnosis not present

## 2022-01-01 DIAGNOSIS — N281 Cyst of kidney, acquired: Secondary | ICD-10-CM | POA: Diagnosis not present

## 2022-01-01 DIAGNOSIS — R109 Unspecified abdominal pain: Secondary | ICD-10-CM | POA: Diagnosis not present

## 2022-01-01 DIAGNOSIS — K828 Other specified diseases of gallbladder: Secondary | ICD-10-CM | POA: Diagnosis not present

## 2022-01-01 DIAGNOSIS — R112 Nausea with vomiting, unspecified: Secondary | ICD-10-CM | POA: Diagnosis not present

## 2022-01-01 DIAGNOSIS — N179 Acute kidney failure, unspecified: Secondary | ICD-10-CM | POA: Diagnosis not present

## 2022-01-02 DIAGNOSIS — R531 Weakness: Secondary | ICD-10-CM | POA: Diagnosis not present

## 2022-01-02 DIAGNOSIS — R112 Nausea with vomiting, unspecified: Secondary | ICD-10-CM | POA: Diagnosis not present

## 2022-01-02 DIAGNOSIS — N179 Acute kidney failure, unspecified: Secondary | ICD-10-CM | POA: Diagnosis not present

## 2022-01-02 DIAGNOSIS — R109 Unspecified abdominal pain: Secondary | ICD-10-CM | POA: Diagnosis not present

## 2022-01-03 DIAGNOSIS — N179 Acute kidney failure, unspecified: Secondary | ICD-10-CM | POA: Diagnosis not present

## 2022-01-03 DIAGNOSIS — J986 Disorders of diaphragm: Secondary | ICD-10-CM | POA: Diagnosis not present

## 2022-01-03 DIAGNOSIS — R579 Shock, unspecified: Secondary | ICD-10-CM | POA: Diagnosis not present

## 2022-01-03 DIAGNOSIS — R112 Nausea with vomiting, unspecified: Secondary | ICD-10-CM | POA: Diagnosis not present

## 2022-01-03 DIAGNOSIS — R109 Unspecified abdominal pain: Secondary | ICD-10-CM | POA: Diagnosis not present

## 2022-01-03 DIAGNOSIS — R531 Weakness: Secondary | ICD-10-CM | POA: Diagnosis not present

## 2022-01-03 DIAGNOSIS — G35 Multiple sclerosis: Secondary | ICD-10-CM | POA: Diagnosis not present

## 2022-01-03 DIAGNOSIS — R918 Other nonspecific abnormal finding of lung field: Secondary | ICD-10-CM | POA: Diagnosis not present

## 2022-01-03 DIAGNOSIS — J90 Pleural effusion, not elsewhere classified: Secondary | ICD-10-CM | POA: Diagnosis not present

## 2022-01-03 DIAGNOSIS — Z452 Encounter for adjustment and management of vascular access device: Secondary | ICD-10-CM | POA: Diagnosis not present

## 2022-01-04 DIAGNOSIS — N1832 Chronic kidney disease, stage 3b: Secondary | ICD-10-CM | POA: Diagnosis not present

## 2022-01-04 DIAGNOSIS — J90 Pleural effusion, not elsewhere classified: Secondary | ICD-10-CM | POA: Diagnosis not present

## 2022-01-04 DIAGNOSIS — G35 Multiple sclerosis: Secondary | ICD-10-CM | POA: Diagnosis not present

## 2022-01-04 DIAGNOSIS — Z4682 Encounter for fitting and adjustment of non-vascular catheter: Secondary | ICD-10-CM | POA: Diagnosis not present

## 2022-01-04 DIAGNOSIS — N179 Acute kidney failure, unspecified: Secondary | ICD-10-CM | POA: Diagnosis not present

## 2022-01-04 DIAGNOSIS — J984 Other disorders of lung: Secondary | ICD-10-CM | POA: Diagnosis not present

## 2022-01-04 DIAGNOSIS — R918 Other nonspecific abnormal finding of lung field: Secondary | ICD-10-CM | POA: Diagnosis not present

## 2022-01-05 DIAGNOSIS — N179 Acute kidney failure, unspecified: Secondary | ICD-10-CM | POA: Diagnosis not present

## 2022-01-05 DIAGNOSIS — R918 Other nonspecific abnormal finding of lung field: Secondary | ICD-10-CM | POA: Diagnosis not present

## 2022-01-05 DIAGNOSIS — Z7189 Other specified counseling: Secondary | ICD-10-CM | POA: Diagnosis not present

## 2022-01-05 DIAGNOSIS — A419 Sepsis, unspecified organism: Secondary | ICD-10-CM | POA: Diagnosis not present

## 2022-01-05 DIAGNOSIS — Z515 Encounter for palliative care: Secondary | ICD-10-CM | POA: Diagnosis not present

## 2022-01-05 DIAGNOSIS — N1832 Chronic kidney disease, stage 3b: Secondary | ICD-10-CM | POA: Diagnosis not present

## 2022-01-05 DIAGNOSIS — G35 Multiple sclerosis: Secondary | ICD-10-CM | POA: Diagnosis not present

## 2022-01-10 DEATH — deceased

## 2022-01-13 NOTE — Progress Notes (Signed)
[name], [name] ([id number]) [id number] Page 1 of 9 Visit Report for 11/08/2021 Arrival Information Details Patient Name: Date of Service: [name] [place] [name], [place] Nancy M. 11/08/2021 1:45 PM Medical Record Number: [id number] Patient Account Number: [id number] Date of Birth/Sex: Treating RN: 1961-01-03 (62 y.o. [name], [name] Primary Care [name]: A [facility] [place] [name] Other Clinician: Referring [name]: Treating [name]/Extender: [name] [facility] [place] [name] Weeks in Treatment: 8 Visit Information History Since Last Visit Added or deleted any medications: No Patient Arrived: Wheel Chair Any new allergies or adverse reactions: No Arrival Time: 13:59 Had a fall or experienced change in No Accompanied By: husband activities of daily living that may affect Transfer Assistance: Manual risk of falls: Patient Identification Verified: Yes Signs or symptoms of abuse/neglect since last visito No Secondary Verification Process Completed: Yes Hospitalized since last visit: No Patient Requires Transmission-Based Precautions: No Implantable device outside of the clinic excluding No Patient Has Alerts: Yes cellular tissue based products placed in the center Patient Alerts: 5/23 ABI: L1.01 R 1.10 since last visit: 5/23 TBI: L0.75 R 0.90 Has Dressing in Place as Prescribed: Yes Has Compression in Place as Prescribed: Yes Pain Present Now: Yes Electronic Signature(s) Signed: 11/08/2021 4:53:44 PM By: Nancy Pilling RN, BSN Entered By: [name] on 11/08/2021 14:00:09 -------------------------------------------------------------------------------- Clinic Level of Care Assessment Details Patient Name: Date of Service: [name], DIA Nancy M. 11/08/2021 1:45 PM Medical Record Number: [id number] Patient Account Number: [id number] Date of Birth/Sex: Treating RN: [birth year] (62 y.o. [name] Primary Care [name]: A [facility] [place] [name] Other  Clinician: Referring [name]: Treating [name]/Extender: [name] [facility] [place] [name] Weeks in Treatment: 8 Clinic Level of Care Assessment Items TOOL 4 Quantity Score X- 1 0 Use when only an EandM is performed on FOLLOW-UP visit ASSESSMENTS - Nursing Assessment / Reassessment X- 1 10 Reassessment of Co-morbidities (includes updates in patient status) X- 1 5 Reassessment of Adherence to Treatment Plan ASSESSMENTS - Wound and Skin A ssessment / Reassessment []  - 0 Simple Wound Assessment / Reassessment - one wound X- 2 5 Complex Wound Assessment / Reassessment - multiple wounds X- 1 10 Dermatologic / Skin Assessment (not related to wound area) ASSESSMENTS - Focused Assessment X- 2 5 Circumferential Edema Measurements - multi extremities []  - 0 Nutritional Assessment / Counseling / Intervention [name] ([id number]) [id number] Page 2 of 9 []  - 0 Lower Extremity Assessment (monofilament, tuning fork, pulses) []  - 0 Peripheral Arterial Disease Assessment (using hand held doppler) ASSESSMENTS - Ostomy and/or Continence Assessment and Care []  - 0 Incontinence Assessment and Management []  - 0 Ostomy Care Assessment and Management (repouching, etc.) PROCESS - Coordination of Care []  - 0 Simple Patient / Family Education for ongoing care X- 1 20 Complex (extensive) Patient / Family Education for ongoing care X- 1 10 Staff obtains Programmer, systems, Records, T Results / Process Orders est []  - 0 Staff telephones HHA, Nursing Homes / Clarify orders / etc []  - 0 Routine Transfer to another Facility (non-emergent condition) []  - 0 Routine Hospital Admission (non-emergent condition) []  - 0 New Admissions / Biomedical engineer / Ordering NPWT Apligraf, etc. , []  - 0 Emergency Hospital Admission (emergent condition) []  - 0 Simple Discharge Coordination X- 1 15 Complex (extensive) Discharge Coordination PROCESS - Special  Needs []  - 0 Pediatric / Minor Patient Management []  - 0 Isolation Patient Management []  - 0 Hearing / Language / Visual special needs []  -  0 Assessment of Community assistance (transportation, D/C planning, etc.) []  - 0 Additional assistance / Altered mentation []  - 0 Support Surface(s) Assessment (bed, cushion, seat, etc.) INTERVENTIONS - Wound Cleansing / Measurement []  - 0 Simple Wound Cleansing - one wound X- 2 5 Complex Wound Cleansing - multiple wounds X- 1 5 Wound Imaging (photographs - any number of wounds) []  - 0 Wound Tracing (instead of photographs) []  - 0 Simple Wound Measurement - one wound X- 2 5 Complex Wound Measurement - multiple wounds INTERVENTIONS - Wound Dressings []  - 0 Small Wound Dressing one or multiple wounds []  - 0 Medium Wound Dressing one or multiple wounds X- 2 20 Large Wound Dressing one or multiple wounds []  - 0 Application of Medications - topical []  - 0 Application of Medications - injection INTERVENTIONS - Miscellaneous []  - 0 External ear exam []  - 0 Specimen Collection (cultures, biopsies, blood, body fluids, etc.) []  - 0 Specimen(s) / Culture(s) sent or taken to Lab for analysis []  - 0 Patient Transfer (multiple staff / Civil Service fast streamer / Similar devices) []  - 0 Simple Staple / Suture removal (25 or less) []  - 0 Complex Staple / Suture removal (26 or more) []  - 0 Hypo / Hyperglycemic Management (close monitor of Blood Glucose) [name], [name] ([id number]) [id number] Page 3 of 9 []  - 0 Ankle / Brachial Index (ABI) - do not check if billed separately X- 1 5 Vital Signs Has the patient been seen at the hospital within the last three years: Yes Total Score: 160 Level Of Care: New/Established - Level 5 Electronic Signature(s) Signed: 11/10/2021 10:46:15 AM By: Nancy Pilling RN, BSN Entered By: [name] on 11/09/2021  10:11:21 -------------------------------------------------------------------------------- Lower Extremity Assessment Details Patient Name: Date of Service: [name], DIA Nancy M. 11/08/2021 1:45 PM Medical Record Number: [id number] Patient Account Number: [id number] Date of Birth/Sex: Treating RN: 1960/10/14 (62 y.o. [name] Primary Care Elesha Thedford: A [facility] [place] [name] Other Clinician: Referring Magaby Rumberger: Treating Khristi Schiller/Extender: [name] [facility] [place] [name] Weeks in Treatment: 8 Edema Assessment Assessed: [Left: Yes] [Right: Yes] Edema: [Left: Yes] [Right: Yes] Calf Left: Right: Point of Measurement: 27 cm From Medial Instep 38 cm 42.5 cm Ankle Left: Right: Point of Measurement: 9 cm From Medial Instep 25 cm 26 cm Vascular Assessment Pulses: Dorsalis Pedis Palpable: [Left:Yes] [Right:Yes] Electronic Signature(s) Signed: 11/08/2021 4:53:44 PM By: Nancy Pilling RN, BSN Entered By: [name] on 11/08/2021 14:09:06 -------------------------------------------------------------------------------- Multi Wound Chart Details Patient Name: Date of Service: [name], DIA Nancy M. 11/08/2021 1:45 PM Medical Record Number: [id number] Patient Account Number: [id number] Date of Birth/Sex: Treating RN: 03-Sep-1960 (62 y.o. F) Primary Care Andrius Andrepont: A [facility], [place] Other Clinician: Referring Mizani Dilday: Treating Zaidan Keeble/Extender: Kalman Shan A [facility] [place] [name] Weeks in Treatment: 8 Vital Signs Height(in): 64 Pulse(bpm): 83 Weight(lbs): 135 Blood Pressure(mmHg): 135/79 Body Mass Index(BMI): 23.2 [name], [name] ([id number]) [id number] Page 4 of 9 Temperature(F): 97.9 Respiratory Rate(breaths/min): 16 [1:Photos:] [N/A:N/A] Right, Circumferential Lower Leg Left, Circumferential Lower Leg N/A Wound Location: Gradually Appeared Gradually Appeared N/A Wounding Event: Lymphedema Lymphedema N/A Primary  Etiology: Anemia, Lymphedema, Pneumothorax, Anemia, Lymphedema, Pneumothorax, N/A Comorbid History: Hypertension, Osteoarthritis, Seizure Hypertension, Osteoarthritis, Seizure Disorder Disorder 06/04/2021 06/04/2021 N/A Date Acquired: 8 8 N/A Weeks of Treatment: Open Open N/A Wound Status: No No N/A Wound Recurrence: Yes Yes N/A Clustered Wound: 5 4 N/A Clustered Quantity: 11x19x0.1 12.5x23x0.1 N/A Measurements L x W x D (cm) 164.148  225.802 N/A A (cm) : rea 16.415 22.58 N/A Volume (cm) : 76.30% -63.80% N/A % Reduction in Area: 76.30% 18.10% N/A % Reduction in Volume: Full Thickness Without Exposed Full Thickness Without Exposed N/A Classification: Support Structures Support Structures Medium Medium N/A Exudate Amount: Serosanguineous Serosanguineous N/A Exudate Type: red, brown red, brown N/A Exudate Color: Distinct, outline attached Distinct, outline attached N/A Wound Margin: Large (67-100%) Large (67-100%) N/A Granulation Amount: Pink, Pale N/A N/A Granulation Quality: None Present (0%) Small (1-33%) N/A Necrotic Amount: Fat Layer (Subcutaneous Tissue): Yes Fat Layer (Subcutaneous Tissue): Yes N/A Exposed Structures: Fascia: No Fascia: No Tendon: No Tendon: No Muscle: No Muscle: No Joint: No Joint: No Bone: No Bone: No Medium (34-66%) Medium (34-66%) N/A Epithelialization: Excoriation: No Excoriation: No N/A Periwound Skin Texture: Induration: No Induration: No Callus: No Callus: No Crepitus: No Crepitus: No Rash: No Rash: No Scarring: No Scarring: No Maceration: No Maceration: No N/A Periwound Skin Moisture: Dry/Scaly: No Dry/Scaly: No Atrophie Blanche: No Atrophie Blanche: No N/A Periwound Skin Color: Cyanosis: No Cyanosis: No Ecchymosis: No Ecchymosis: No Erythema: No Erythema: No Hemosiderin Staining: No Hemosiderin Staining: No Mottled: No Mottled: No Pallor: No Pallor: No Rubor: No Rubor: No No Abnormality No  Abnormality N/A Temperature: Treatment Notes Electronic Signature(s) Signed: 11/12/2021 1:40:10 PM By: Kalman Shan DO Entered By: Kalman Shan on 11/08/2021 14:36:01 Charm Barges ([id number]) [id number] Page 5 of 9 -------------------------------------------------------------------------------- Multi-Disciplinary Care Plan Details Patient Name: Date of Service: [name] [place] [name], [place] Nancy M. 11/08/2021 1:45 PM Medical Record Number: [id number] Patient Account Number: [id number] Date of Birth/Sex: Treating RN: May 07, 1960 (62 y.o. Benjaman Lobe Primary Care Jaylynn Siefert: A [facility] [place] [name] Other Clinician: Referring Leita Lindbloom: Treating Jahn Franchini/Extender: [name] [facility] [place] [name] Weeks in Treatment: 8 Active Inactive Electronic Signature(s) Signed: 12/31/2021 12:07:46 PM By: Nancy Pilling RN, BSN Signed: 01/12/2022 5:36:43 PM By: Rhae Hammock RN Entered By: [name] on 12/31/2021 12:07:45 -------------------------------------------------------------------------------- Pain Assessment Details Patient Name: Date of Service: [name], DIA Nancy M. 11/08/2021 1:45 PM Medical Record Number: [id number] Patient Account Number: [id number] Date of Birth/Sex: Treating RN: Jul 29, 1960 (62 y.o. [name] Primary Care Yaasir Menken: A [facility] [place] [name] Other Clinician: Referring Callaghan Laverdure: Treating Lige Lakeman/Extender: [name] [facility] [place] [name] Weeks in Treatment: 8 Active Problems Location of Pain Severity and Description of Pain Patient Has Paino Yes Site Locations Pain Location: Generalized Pain Rate the pain. Current Pain Level: 2 Pain Management and Medication Current Pain Management: Medication: No Cold Application: No Rest: No Massage: No Activity: No T.E.N.S.: No Heat Application: No Leg drop or elevation: No Is the Current Pain Management Adequate: Adequate How does your wound impact your  activities of daily livingo Sleep: No Bathing: No Appetite: No Relationship With Others: No Bladder Continence: No Emotions: No Bowel Continence: No Work: No Toileting: No Drive: No Dressing: No HobbiesSHAUNTELLE, [name] ([id number]) [id number] Page 6 of 9 Electronic Signature(s) Signed: 11/08/2021 4:53:44 PM By: Nancy Pilling RN, BSN Entered By: [name] on 11/08/2021 14:00:23 -------------------------------------------------------------------------------- Patient/Caregiver Education Details Patient Name: Date of Service: [name], DIA Nancy M. 10/30/2023andnbsp1:45 PM Medical Record Number: [id number] Patient Account Number: [id number] Date of Birth/Gender: Treating RN: May 12, 1960 (62 y.o. [name], [name] Primary Care Physician: A [facility] [place] [name] Other Clinician: Referring Physician: Treating Physician/Extender: [name] [facility] [place] [name] Weeks in Treatment: 8 Education Assessment Education Provided To: Patient Education Topics Provided Wound/Skin Impairment:  Methods: Explain/Verbal Responses: Reinforcements needed, State content correctly Electronic Signature(s) Signed: 01/12/2022 5:36:43 PM By: Rhae Hammock RN Entered By: Rhae Hammock on 11/08/2021 14:22:31 -------------------------------------------------------------------------------- Wound Assessment Details Patient Name: Date of Service: [name], DIA Nancy M. 11/08/2021 1:45 PM Medical Record Number: [id number] Patient Account Number: [id number] Date of Birth/Sex: Treating RN: July 14, 1960 (62 y.o. [name] Primary Care Levetta Bognar: A [facility] [place] [name] Other Clinician: Referring Raesha Coonrod: Treating Chi Woodham/Extender: [name] [facility] [place] [name] Weeks in Treatment: 8 Wound Status Wound Number: 1 Primary Lymphedema Etiology: Wound Location: Right, Circumferential Lower Leg Wound Open Wounding Event: Gradually  Appeared Status: Date Acquired: 06/04/2021 Comorbid Anemia, Lymphedema, Pneumothorax, Hypertension, Weeks Of Treatment: 8 History: Osteoarthritis, Seizure Disorder Clustered Wound: Yes Photos [name], [name] ([id number]) [id number] Page 7 of 9 Wound Measurements Length: (cm) 1 Width: (cm) 1 Depth: (cm) 0 Clustered Quantity: 5 Area: (cm) Volume: (cm) 1 % Reduction in Area: 76.3% 9 % Reduction in Volume: 76.3% .1 Epithelialization: Medium (34-66%) Tunneling: No 164.148 Undermining: No 16.415 Wound Description Classification: Full Thickness Without Exposed Supp Wound Margin: Distinct, outline attached Exudate Amount: Medium Exudate Type: Serosanguineous Exudate Color: red, brown ort Structures Foul Odor After Cleansing: No Slough/Fibrino No Wound Bed Granulation Amount: Large (67-100%) Exposed Structure Granulation Quality: Pink, Pale Fascia Exposed: No Necrotic Amount: None Present (0%) Fat Layer (Subcutaneous Tissue) Exposed: Yes Tendon Exposed: No Muscle Exposed: No Joint Exposed: No Bone Exposed: No Periwound Skin Texture Texture Color No Abnormalities Noted: No No Abnormalities Noted: No Callus: No Atrophie Blanche: No Crepitus: No Cyanosis: No Excoriation: No Ecchymosis: No Induration: No Erythema: No Rash: No Hemosiderin Staining: No Scarring: No Mottled: No Pallor: No Moisture Rubor: No No Abnormalities Noted: No Dry / Scaly: No Temperature / Pain Maceration: No Temperature: No Abnormality Electronic Signature(s) Signed: 11/08/2021 4:53:44 PM By: Nancy Pilling RN, BSN Entered By: [name] on 11/08/2021 14:10:47 -------------------------------------------------------------------------------- Wound Assessment Details Patient Name: Date of Service: [name], DIA Nancy M. 11/08/2021 1:45 PM Medical Record Number: [id number] Patient Account Number: [id number] Date of Birth/Sex: Treating RN: 07/15/1960 (62 y.o. [name] Primary Care Nain Rudd: A [facility], [place] Other Clinician: Referring Markies Mowatt: Treating Shakiyla Kook/Extender: [name] [facility] [place] [name] Weeks in Treatment: 8 Wound Status ZENORA, KARPEL ([id number]) [id number] Page 8 of 9 Wound Number: 2 Primary Lymphedema Etiology: Wound Location: Left, Circumferential Lower Leg Wound Open Wounding Event: Gradually Appeared Status: Date Acquired: 06/04/2021 Comorbid Anemia, Lymphedema, Pneumothorax, Hypertension, Weeks Of Treatment: 8 History: Osteoarthritis, Seizure Disorder Clustered Wound: Yes Photos Wound Measurements Length: (cm) Width: (cm) Depth: (cm) Clustered Quantity: Area: (cm) Volume: (cm) 12.5 % Reduction in Area: -63.8% 23 % Reduction in Volume: 18.1% 0.1 Epithelialization: Medium (34-66%) 4 Tunneling: No 225.802 Undermining: No 22.58 Wound Description Classification: Full Thickness Without Exposed Supp Wound Margin: Distinct, outline attached Exudate Amount: Medium Exudate Type: Serosanguineous Exudate Color: red, brown ort Structures Foul Odor After Cleansing: No Slough/Fibrino Yes Wound Bed Granulation Amount: Large (67-100%) Exposed Structure Necrotic Amount: Small (1-33%) Fascia Exposed: No Necrotic Quality: Adherent Slough Fat Layer (Subcutaneous Tissue) Exposed: Yes Tendon Exposed: No Muscle Exposed: No Joint Exposed: No Bone Exposed: No Periwound Skin Texture Texture Color No Abnormalities Noted: No No Abnormalities Noted: No Callus: No Atrophie Blanche: No Crepitus: No Cyanosis: No Excoriation: No Ecchymosis: No Induration: No Erythema: No Rash: No Hemosiderin Staining: No Scarring: No Mottled: No Pallor: No Moisture Rubor: No No Abnormalities Noted: No Dry / Scaly:  No Temperature / Pain Maceration: No Temperature: No Abnormality Electronic Signature(s) Signed: 11/08/2021 4:53:44 PM By: Nancy Pilling RN, BSN Entered By:  [name] on 11/08/2021 14:11:18 Vitals Details -------------------------------------------------------------------------------- Charm Barges ([id number]) [id number] Page 9 of 9 Patient Name: Date of Service: [name], [place] Nancy M. 11/08/2021 1:45 PM Medical Record Number: [id number] Patient Account Number: [id number] Date of Birth/Sex: Treating RN: Jun 23, 1960 (62 y.o. [name], [name] Primary Care Meghanne Pletz: A [facility] [place] [name] Other Clinician: Referring Zi Sek: Treating Amari Zagal/Extender: Kalman Shan A [facility], [place] [name] Weeks in Treatment: 8 Vital Signs Time Taken: 14:00 Temperature (F): 97.9 Height (in): 64 Pulse (bpm): 83 Weight (lbs): 135 Respiratory Rate (breaths/min): 16 Body Mass Index (BMI): 23.2 Blood Pressure (mmHg): 135/79 Reference Range: 80 - 120 mg / dl Electronic Signature(s) Signed: 11/08/2021 4:53:44 PM By: Nancy Pilling RN, BSN Entered By: [name] on 11/08/2021 14:08:46

## 2023-01-11 DEATH — deceased

## 2023-05-20 IMAGING — DX DG CHEST 1V PORT
1 series · 2 of 2 positions shown · non-contrast
Comparison: 05/06/2021, 05/05/2021, 05/04/2021

CLINICAL DATA: Follow-up pneumothorax

EXAM:
PORTABLE CHEST 1 VIEW

[Series 1: chest · 0.14mm/px · 2 of 2 slices shown]
[im 1/2]
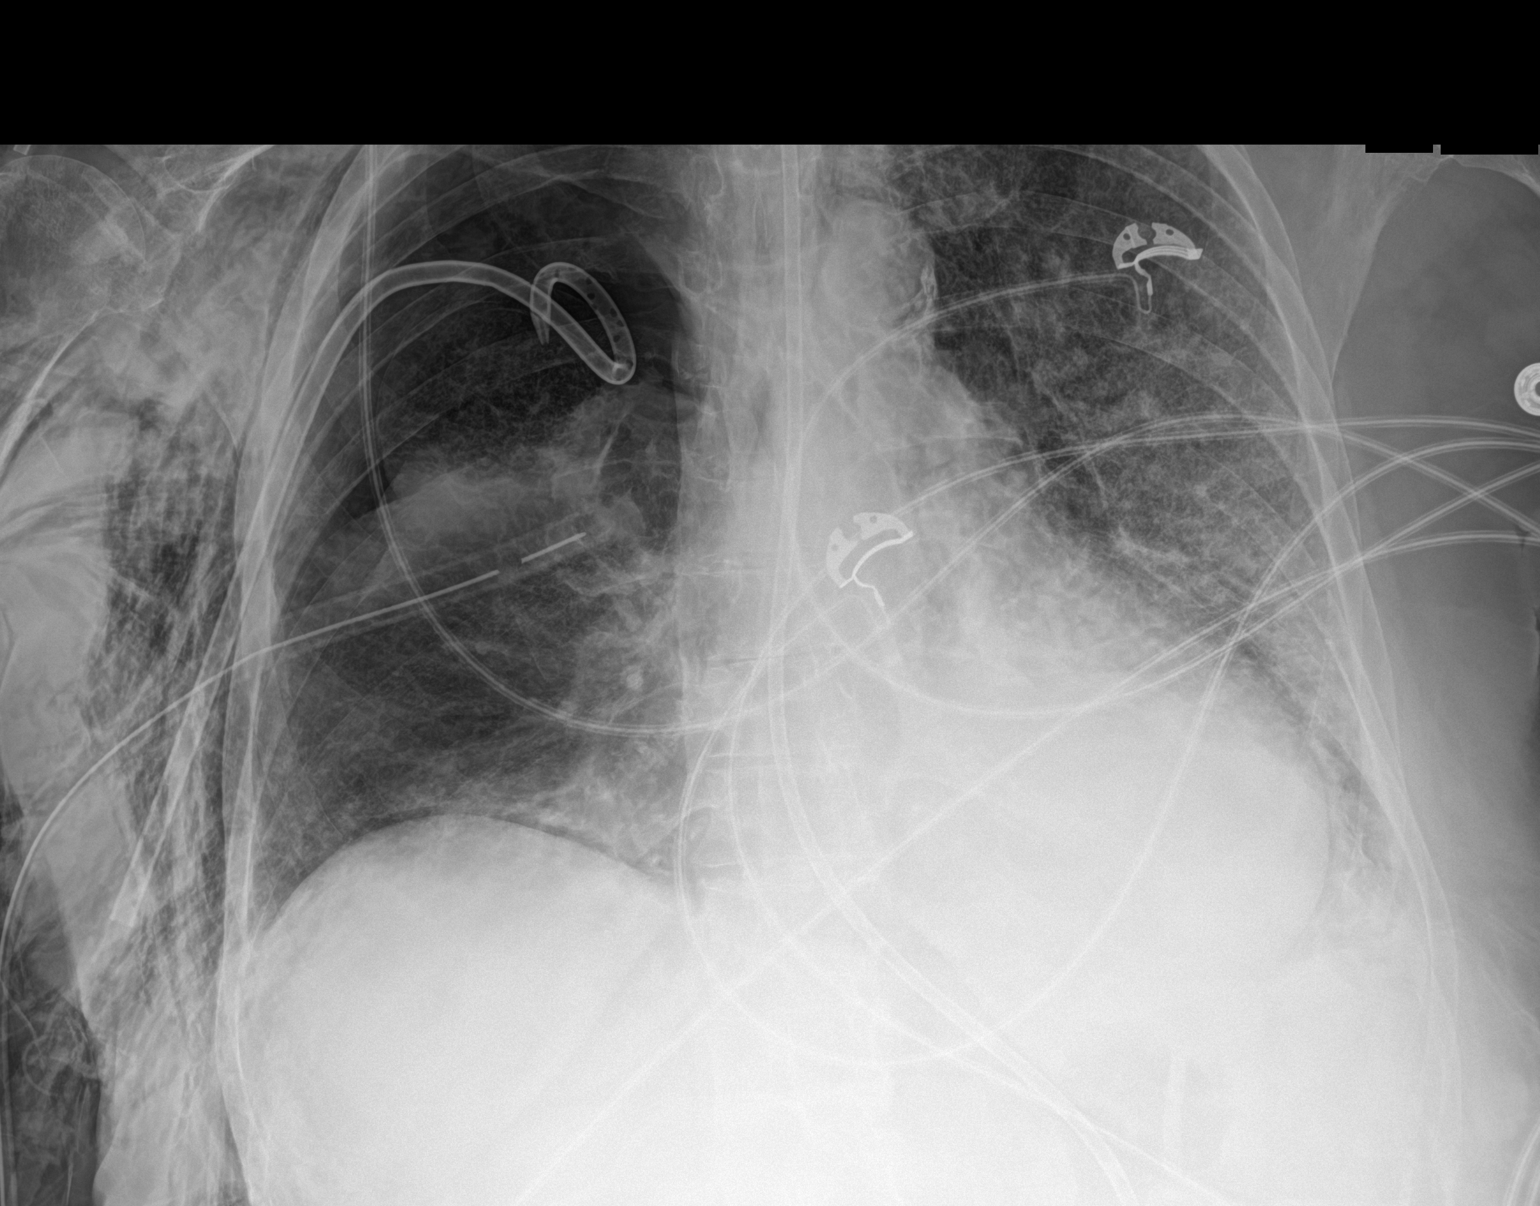
[im 2/2]
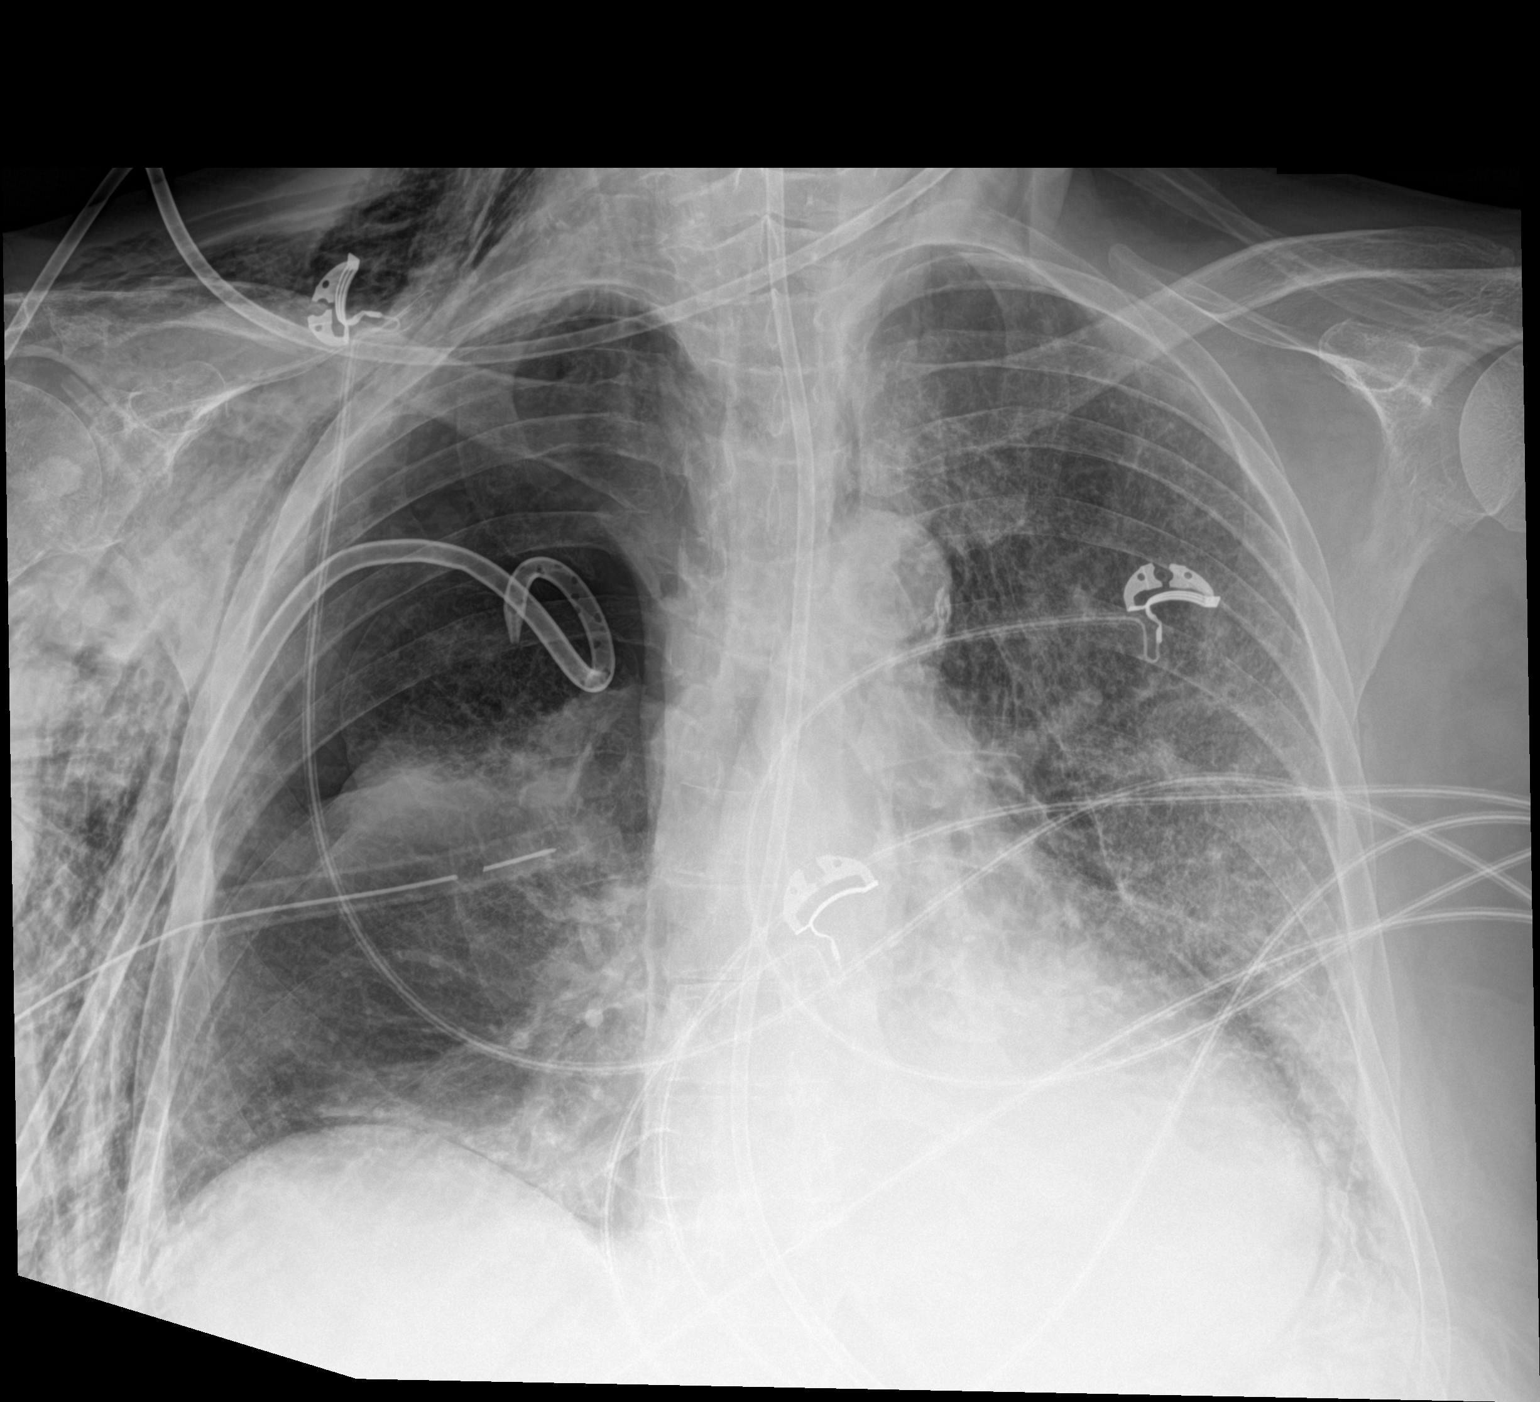

[2 of 2 positions shown; findings below may reference images not displayed]

FINDINGS: Esophageal tube tip is below the diaphragm but incompletely
visualized. 2 right-sided chest tubes remain in place. Increasing
subcutaneous emphysema in the right neck and chest wall. Persistent
moderate to large right pneumothorax. Increasing airspace disease at
the right mid lung and right base. Dense consolidation left lung
base. Diffuse reticular and ground-glass opacity throughout the left
thorax. Stable cardiac size.
IMPRESSION: 1. Similar positioning of the right-sided chest tube side with
grossly stable size of moderate to large right pneumothorax
2. Increasing subcutaneous emphysema in the right chest wall and
neck
3. Increasing airspace disease in the right mid lung and right base.
No change in dense left lung base consolidation with diffuse
interstitial and ground-glass disease

## 2023-05-20 IMAGING — CT CT CHEST W/O CM
2 of 3 series · 11 of 36 positions shown, 13 images · non-contrast
Comparison: 04/26/2021, 05/07/2021.
COMPARISON: 04/26/2021, 05/07/2021.
COMPARISON: 04/26/2021, 05/07/2021.

Addendum:
CLINICAL DATA: Pneumothorax.



[Series 3: chest w/o 2mm st · axial · non-contrast · 0.80mm/px · z∈[+1207,+1459]mm · 8 of 150 slices shown, 10 images]
[im 12/150  mediastinal]
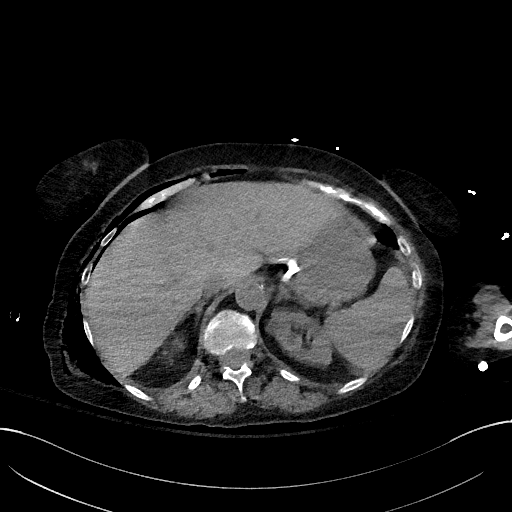
[im 12/150  lung]
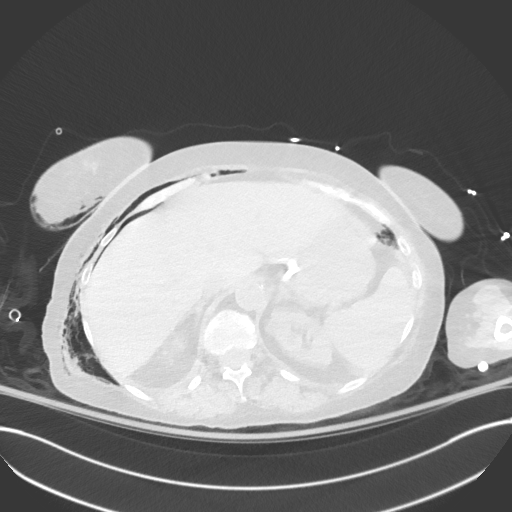
[im 28/150  lung]
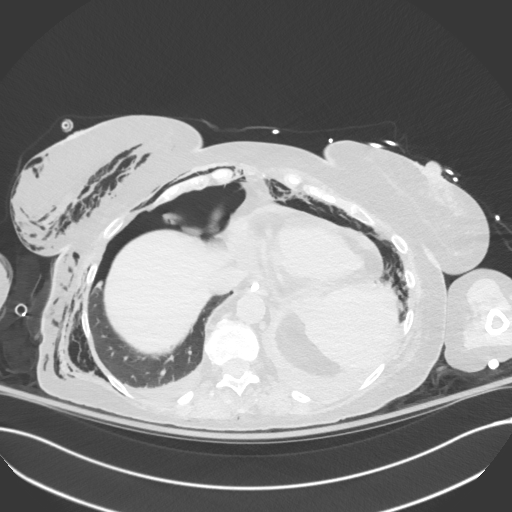
[im 50/150  lung]
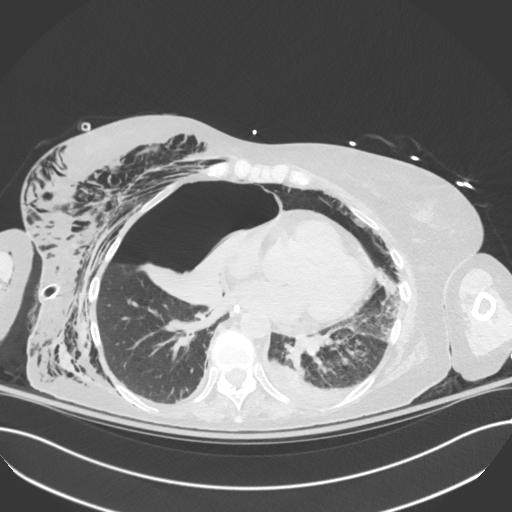
[im 67/150  lung]
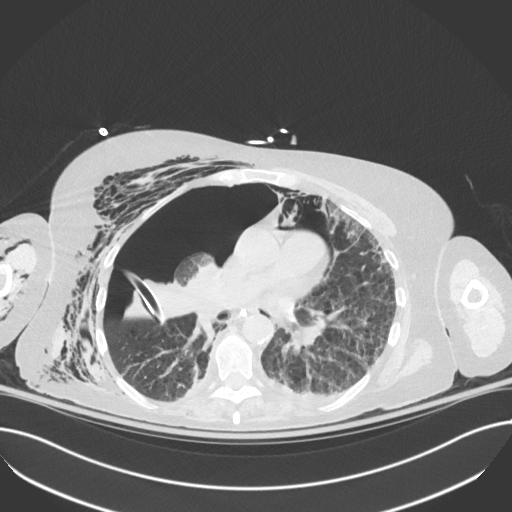
[im 83/150  mediastinal]
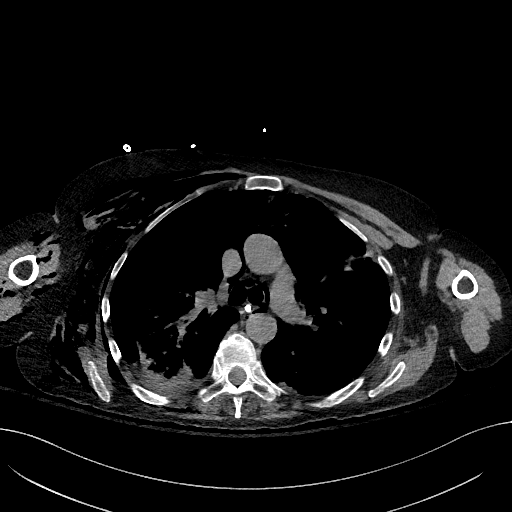
[im 83/150  lung]
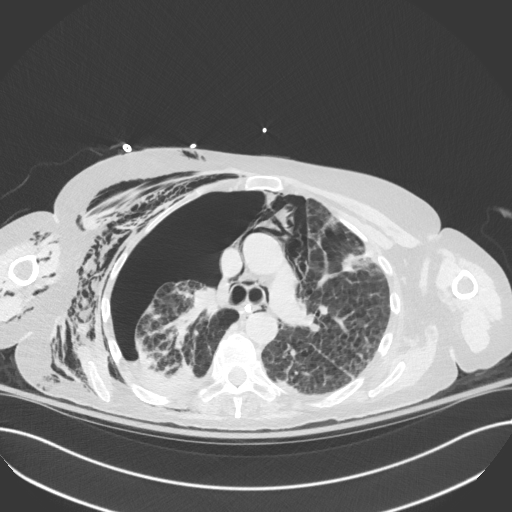
[im 100/150  lung]
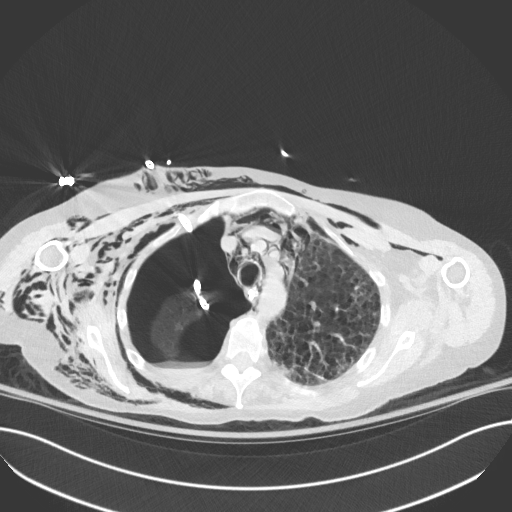
[im 122/150  lung]
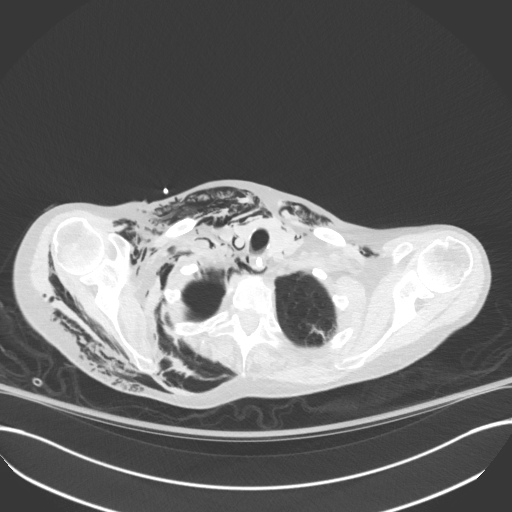
[im 138/150  lung]
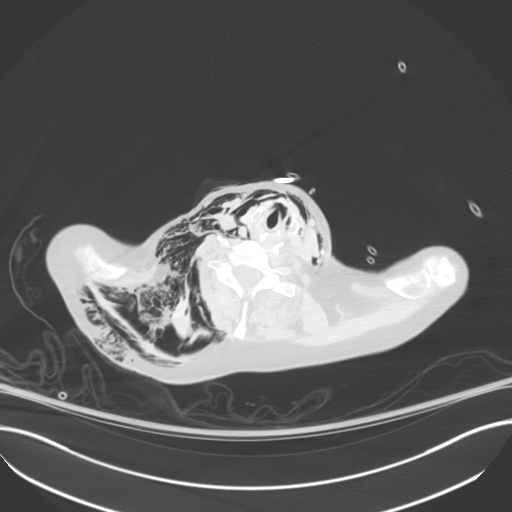

[Series 6: chest w/o 2mm st cor · coronal · non-contrast · 0.59mm/px · 3 of 128 slices shown]
[im 26/128  lung]
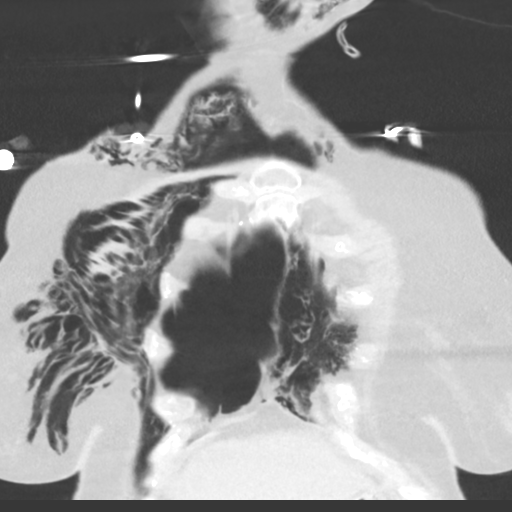
[im 51/128  lung]
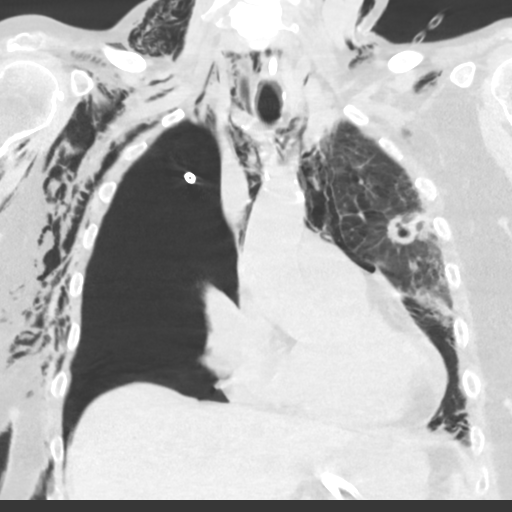
[im 77/128  lung]
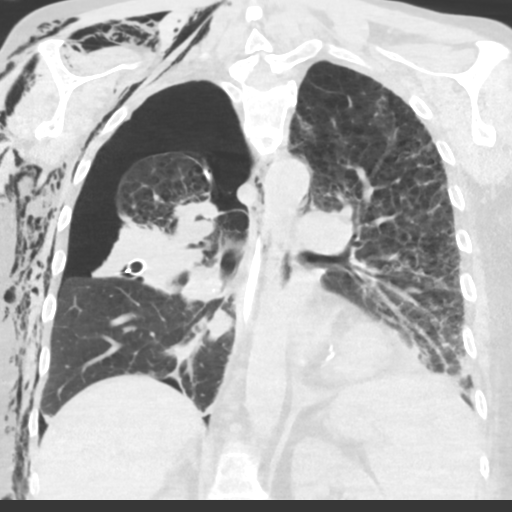

[11 of 36 positions shown; findings below may reference images not displayed]

FINDINGS: Cardiovascular: The heart is normal in size and there is a small
pericardial effusion. Multi-vessel coronary artery calcifications
are noted. There is atherosclerotic calcification of the aorta
without evidence of aneurysm. The pulmonary trunk is normal in
caliber.

Mediastinum/Nodes: A rim calcified nodule is present in the right
lobe of the thyroid gland. The trachea is within normal limits. An
enteric tube is noted in the esophagus. No mediastinal or axillary
lymphadenopathy. Evaluation of the hilar regions is limited due to
lack of IV contrast. There is extensive pneumomediastinum.

Lungs/Pleura: There is a large right pneumothorax with chest tubes
in place. There is a chest tube entering the anterior upper chest
which appears appropriate in position. There is a large bore chest
tube entering the right lateral chest wall with occlusion of the tip
which terminates in the right lower lobe. The distal tip of the
patchy consolidation is noted in the right upper lobe and there is
right middle lobe collapse. Atelectasis is noted in the right lower
lobe. Emphysematous changes are present in the lungs bilaterally.
There is a cavitary lesion in the left upper lobe measuring 2.5 cm,
axial image 71. Scattered atelectasis or infiltrate is noted in the
left lung. There are small bilateral pleural effusions.

Upper Abdomen: An enteric tube is seen in the stomach. Bilateral
renal cysts are noted.

Musculoskeletal: There is extensive subcutaneous emphysema in the
anterior and posterior chest walls bilaterally and lateral chest
wall on the right. Degenerative changes are present in the
midthoracic spine. No acute osseous abnormality.
IMPRESSION: 1. Large right pneumothorax with [address]. The
anterior pigtail catheter appears appropriate in position. There is
a lateral large-bore chest tube on the right which terminates in the
right lower lobe in the tip of the chest tube appears occluded.
Repositioning is suggested.
2. Right upper lobe consolidation and right middle lobe collapse
with patchy atelectasis or infiltrate bilaterally.
3. Small bilateral pleural effusions.
4. Cavitary lesion in the left upper lobe, not significantly changed
from the prior exam.
5. Extensive pneumomediastinum and subcutaneous emphysema.
6. Emphysema.
7. Aortic atherosclerosis and coronary artery calcifications.

ADDENDUM:
Critical findings were reported to PA Maritere Varona at [DATE] a.m. No
obvious evidence of bronchopleural fistula is seen. Findings may be
related to recent large-bore chest tube placement. Repositioning is
recommended.

ADDENDUM:
Given worsening of imaging findings over time, repositioning of
large-bore chest tube is recommended. There is also communication of
cutaneous emphysema and pneumothorax in the right lateral chest wall
chest wall adjacent to the insertion site of the large-bore chest
tube between the T5 and T6 intercostal space. Findings were relayed
to PA Jualswap at [DATE] a.m.

*** End of Addendum ***
Addendum:
FINDINGS: Cardiovascular: The heart is normal in size and there is a small
pericardial effusion. Multi-vessel coronary artery calcifications
are noted. There is atherosclerotic calcification of the aorta
without evidence of aneurysm. The pulmonary trunk is normal in
caliber.

Mediastinum/Nodes: A rim calcified nodule is present in the right
lobe of the thyroid gland. The trachea is within normal limits. An
enteric tube is noted in the esophagus. No mediastinal or axillary
lymphadenopathy. Evaluation of the hilar regions is limited due to
lack of IV contrast. There is extensive pneumomediastinum.

Lungs/Pleura: There is a large right pneumothorax with chest tubes
in place. There is a chest tube entering the anterior upper chest
which appears appropriate in position. There is a large bore chest
tube entering the right lateral chest wall with occlusion of the tip
which terminates in the right lower lobe. The distal tip of the
patchy consolidation is noted in the right upper lobe and there is
right middle lobe collapse. Atelectasis is noted in the right lower
lobe. Emphysematous changes are present in the lungs bilaterally.
There is a cavitary lesion in the left upper lobe measuring 2.5 cm,
axial image 71. Scattered atelectasis or infiltrate is noted in the
left lung. There are small bilateral pleural effusions.

Upper Abdomen: An enteric tube is seen in the stomach. Bilateral
renal cysts are noted.

Musculoskeletal: There is extensive subcutaneous emphysema in the
anterior and posterior chest walls bilaterally and lateral chest
wall on the right. Degenerative changes are present in the
midthoracic spine. No acute osseous abnormality.
IMPRESSION: 1. Large right pneumothorax with [address] The
anterior pigtail catheter appears appropriate in position. There is
a lateral large-bore chest tube on the right which terminates in the
right lower lobe in the tip of the chest tube appears occluded.
Repositioning is suggested.
2. Right upper lobe consolidation and right middle lobe collapse
with patchy atelectasis or infiltrate bilaterally.
3. Small bilateral pleural effusions.
4. Cavitary lesion in the left upper lobe, not significantly changed
from the prior exam.
5. Extensive pneumomediastinum and subcutaneous emphysema.
6. Emphysema.
7. Aortic atherosclerosis and coronary artery calcifications.

ADDENDUM:
Critical findings were reported to PA Maritere Varona at [DATE] a.m. No
obvious evidence of bronchopleural fistula is seen. Findings may be
related to recent large-bore chest tube placement. Repositioning is
recommended.

*** End of Addendum ***
FINDINGS: Cardiovascular: The heart is normal in size and there is a small
pericardial effusion. Multi-vessel coronary artery calcifications
are noted. There is atherosclerotic calcification of the aorta
without evidence of aneurysm. The pulmonary trunk is normal in
caliber.

Mediastinum/Nodes: A rim calcified nodule is present in the right
lobe of the thyroid gland. The trachea is within normal limits. An
enteric tube is noted in the esophagus. No mediastinal or axillary
lymphadenopathy. Evaluation of the hilar regions is limited due to
lack of IV contrast. There is extensive pneumomediastinum.

Lungs/Pleura: There is a large right pneumothorax with chest tubes
in place. There is a chest tube entering the anterior upper chest
which appears appropriate in position. There is a large bore chest
tube entering the right lateral chest wall with occlusion of the tip
which terminates in the right lower lobe. The distal tip of the
patchy consolidation is noted in the right upper lobe and there is
right middle lobe collapse. Atelectasis is noted in the right lower
lobe. Emphysematous changes are present in the lungs bilaterally.
There is a cavitary lesion in the left upper lobe measuring 2.5 cm,
axial image 71. Scattered atelectasis or infiltrate is noted in the
left lung. There are small bilateral pleural effusions.

Upper Abdomen: An enteric tube is seen in the stomach. Bilateral
renal cysts are noted.

Musculoskeletal: There is extensive subcutaneous emphysema in the
anterior and posterior chest walls bilaterally and lateral chest
wall on the right. Degenerative changes are present in the
midthoracic spine. No acute osseous abnormality.
IMPRESSION: 1. Large right pneumothorax with [address] The
anterior pigtail catheter appears appropriate in position. There is
a lateral large-bore chest tube on the right which terminates in the
right lower lobe in the tip of the chest tube appears occluded.
Repositioning is suggested.
2. Right upper lobe consolidation and right middle lobe collapse
with patchy atelectasis or infiltrate bilaterally.
3. Small bilateral pleural effusions.
4. Cavitary lesion in the left upper lobe, not significantly changed
from the prior exam.
5. Extensive pneumomediastinum and subcutaneous emphysema.
6. Emphysema.
7. Aortic atherosclerosis and coronary artery calcifications.

## 2023-05-21 IMAGING — DX DG CHEST 1V PORT
1 series · 1 of 1 positions shown · non-contrast
Comparison: 05/08/2021 at 3866 hours

CLINICAL DATA: Pneumothorax

EXAM:
PORTABLE CHEST 1 VIEW

[chest]
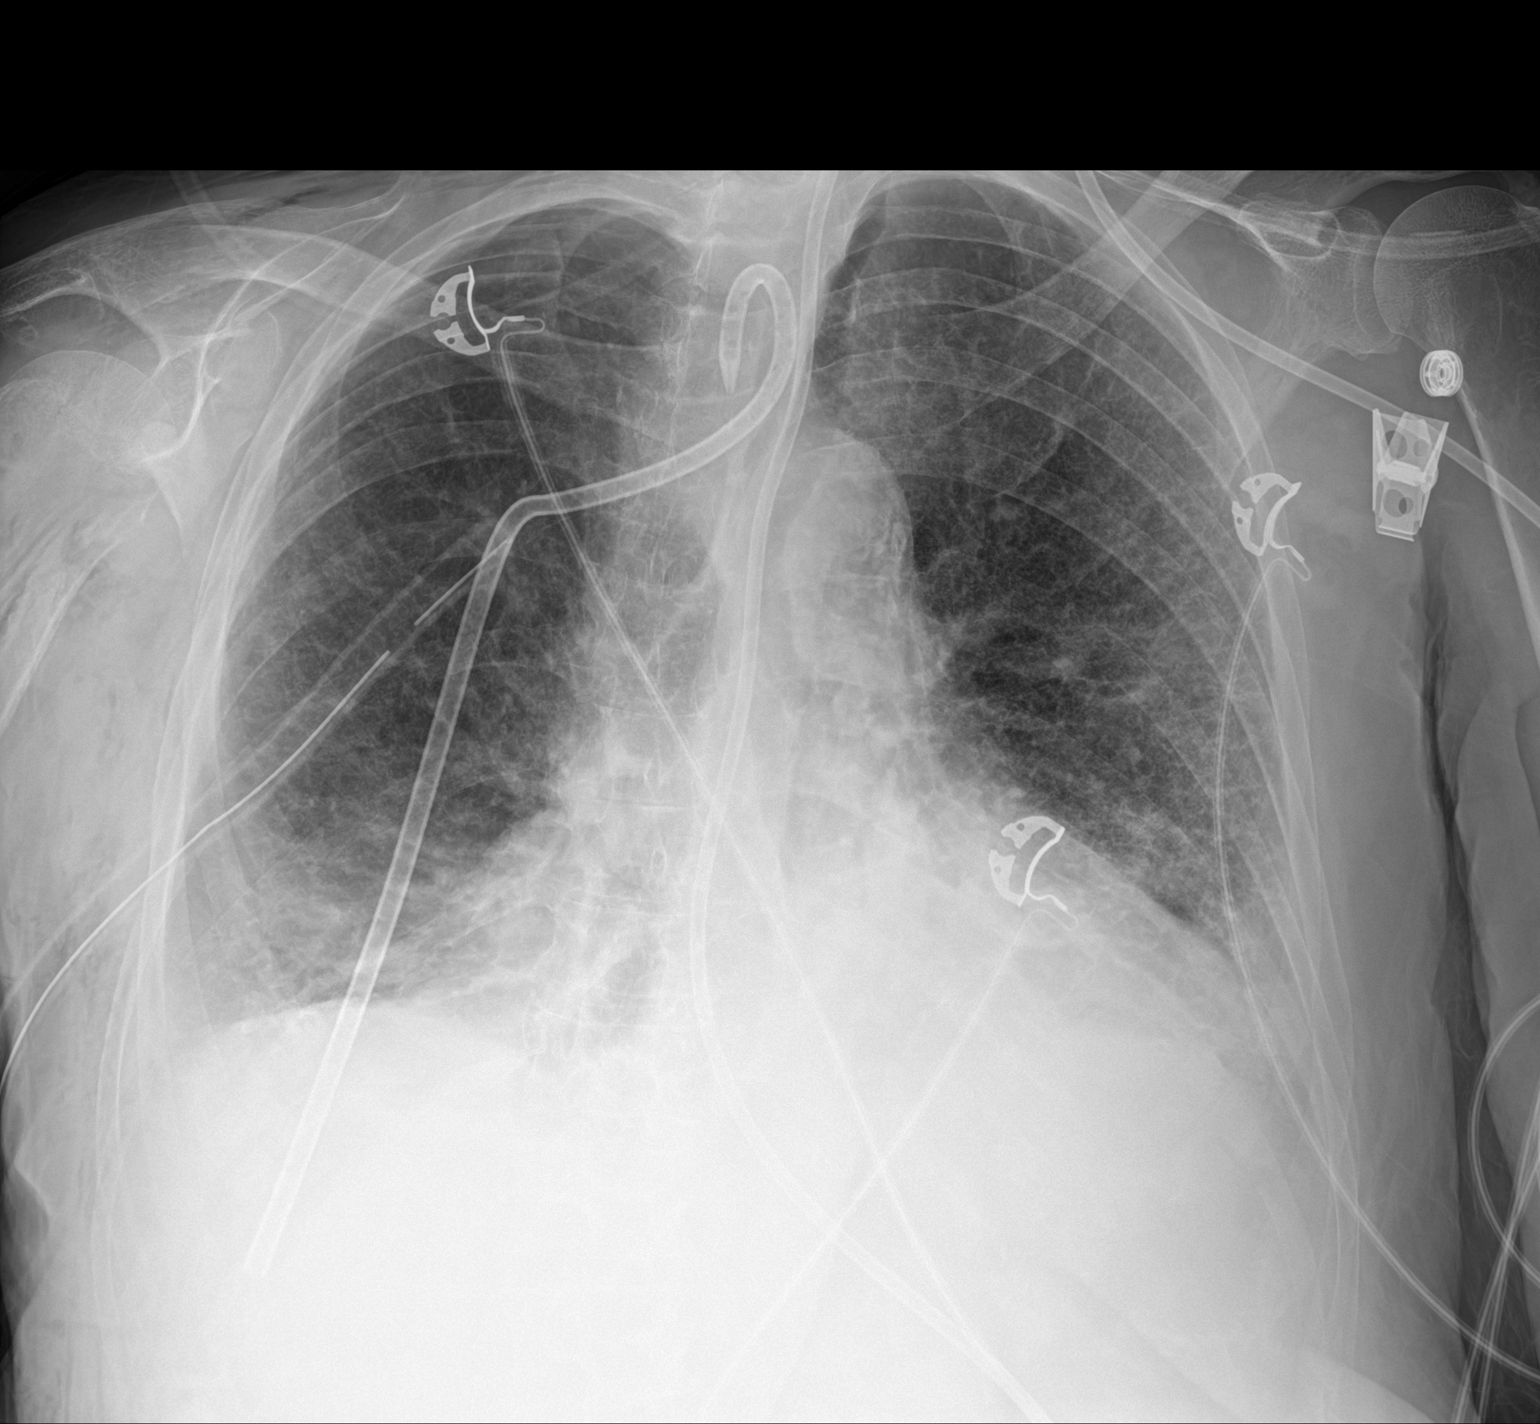

[1 of 1 positions shown; findings below may reference images not displayed]

FINDINGS: Interval repositioning of 2 right-sided chest tubes, both of which
are now directed more towards the lung apex. Near complete
resolution of right-sided pneumothorax with only a trace residual
right apical pneumothorax remaining. Stable cardiomediastinal
contours. Enteric tube remains in place. Streaky bibasilar
opacities. No large pleural effusion. No left-sided pneumothorax.
Extensive right chest wall subcutaneous emphysema.
IMPRESSION: Near complete resolution of right-sided pneumothorax with only a
trace residual right apical pneumothorax remaining.

## 2023-05-21 IMAGING — DX DG CHEST 1V PORT
1 series · 1 of 1 positions shown · non-contrast
Comparison: Comparison made with May 07, 2021.
COMPARISON: Comparison made with May 07, 2021.

Addendum:
CLINICAL DATA: Acute respiratory failure in a 61-year-old female.

EXAM:
PORTABLE CHEST 1 VIEW

[chest ap]
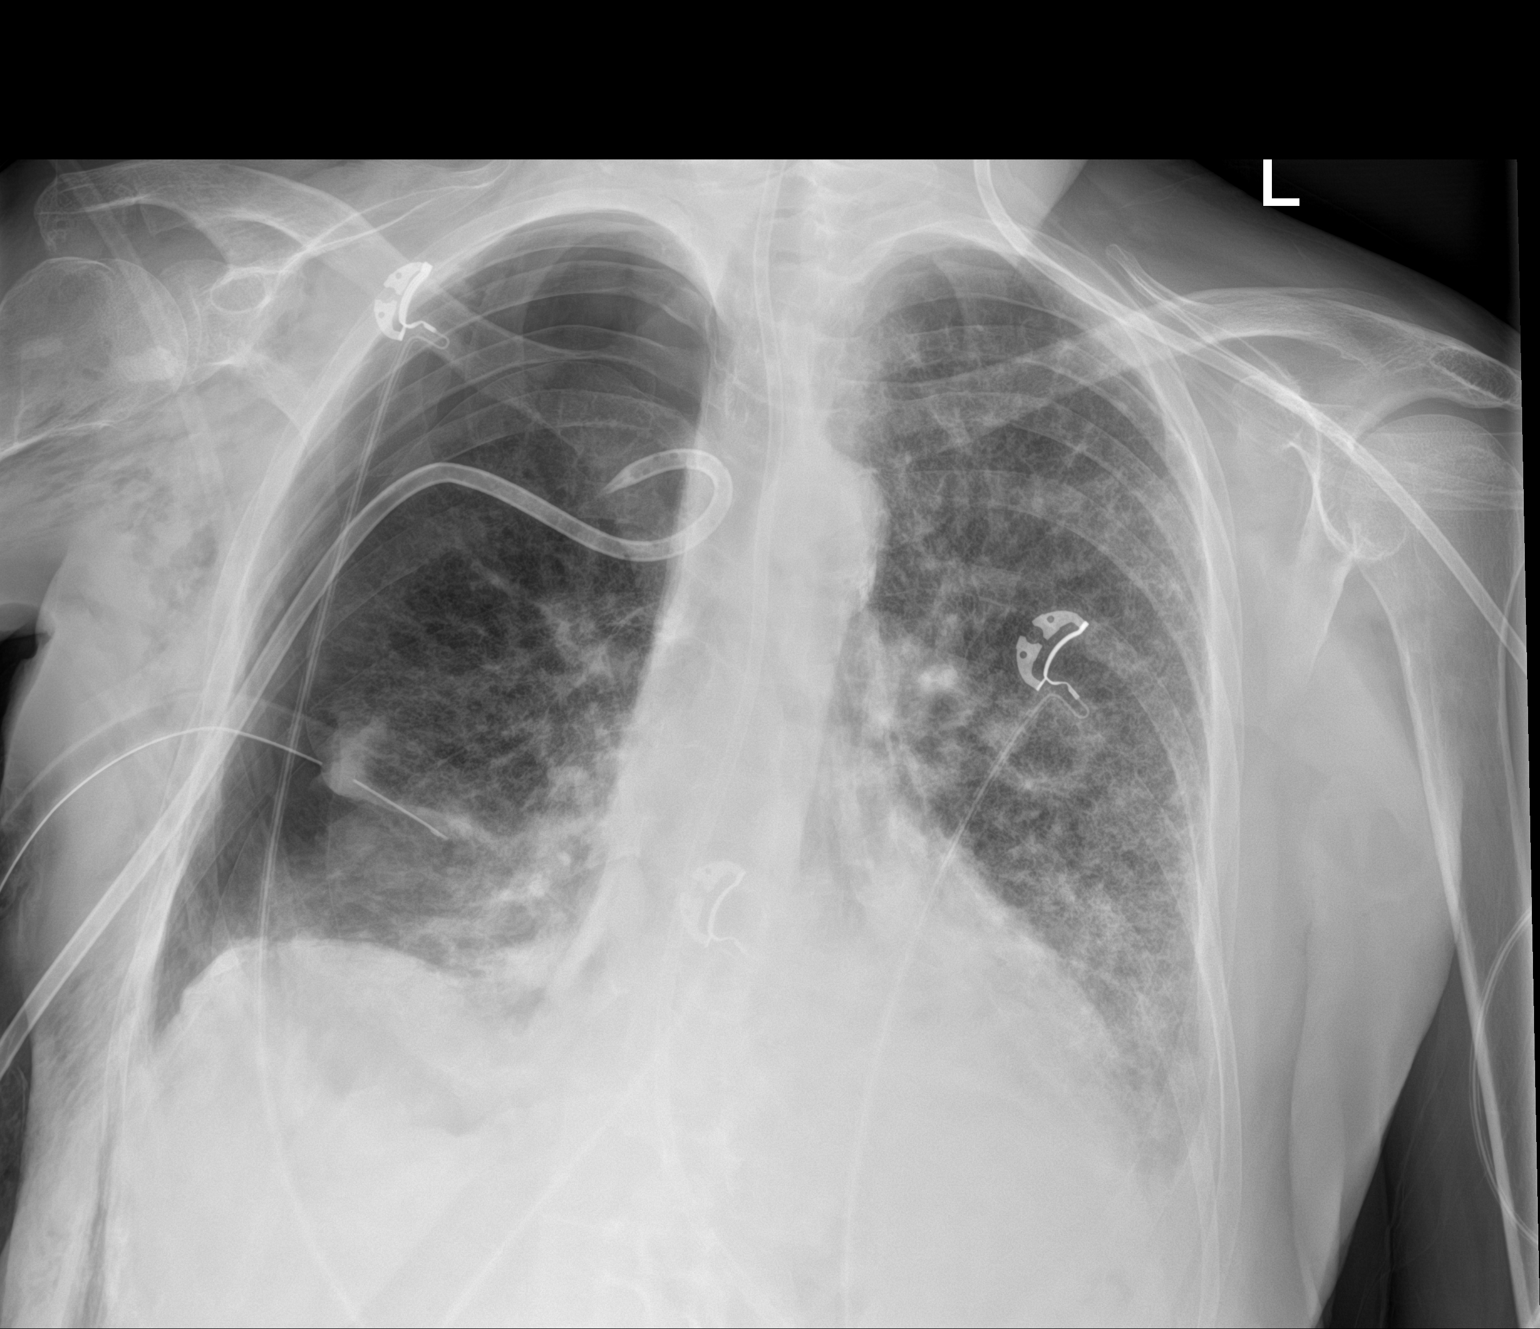

[1 of 1 positions shown; findings below may reference images not displayed]

FINDINGS: Interval recurrence of RIGHT-sided pneumothorax despite presence of
2 RIGHT-sided chest tubes. These chest tubes show stable appearance
1 large-bore in the inferior chest and the other a small bore
catheter with pigtail in the superomedial chest. Image rotated to
the RIGHT. Accounting for this there is some mediastinal shift
present.

Pneumothorax measuring 4 cm from the apex is likely moderately
large.

Increased interstitial markings and nodular opacities with basilar
consolidation as on the previous study.

Feeding tube courses through in off the field of the radiograph.

EKG leads project over the chest.

Subcutaneous emphysema present along the RIGHT chest wall.

On limited assessment there is no acute skeletal process.
IMPRESSION: 1. Interval recurrence of RIGHT-sided pneumothorax, now moderately
large despite presence of 2 RIGHT-sided chest tubes. Mediastinal
shift is present implying some tension.
2. Stable bilateral interstitial and airspace opacities with basilar
consolidation and suspected LEFT-sided effusion.

A call is out to the referring provider to further discuss findings
in the above case.

ADDENDUM:
Critical Value/emergent results were called by telephone at the time
of interpretation on 05/08/2021 at [DATE] to provider LINDA LUZ SOL ,
who verbally acknowledged these results.

*** End of Addendum ***
FINDINGS: Interval recurrence of RIGHT-sided pneumothorax despite presence of
2 RIGHT-sided chest tubes. These chest tubes show stable appearance
1 large-bore in the inferior chest and the other a small bore
catheter with pigtail in the superomedial chest. Image rotated to
the RIGHT. Accounting for this there is some mediastinal shift
present.

Pneumothorax measuring 4 cm from the apex is likely moderately
large.

Increased interstitial markings and nodular opacities with basilar
consolidation as on the previous study.

Feeding tube courses through in off the field of the radiograph.

EKG leads project over the chest.

Subcutaneous emphysema present along the RIGHT chest wall.

On limited assessment there is no acute skeletal process.
IMPRESSION: 1. Interval recurrence of RIGHT-sided pneumothorax, now moderately
large despite presence of 2 RIGHT-sided chest tubes. Mediastinal
shift is present implying some tension.
2. Stable bilateral interstitial and airspace opacities with basilar
consolidation and suspected LEFT-sided effusion.

A call is out to the referring provider to further discuss findings
in the above case.
